# Patient Record
Sex: Male | Born: 1935
Health system: Southern US, Community
[De-identification: ages and names within clinical notes are randomized; demographics above are authoritative.]

## PROBLEM LIST (undated history)

## (undated) DIAGNOSIS — N138 Other obstructive and reflux uropathy: Secondary | ICD-10-CM

## (undated) DIAGNOSIS — K219 Gastro-esophageal reflux disease without esophagitis: Secondary | ICD-10-CM

## (undated) DIAGNOSIS — K648 Other hemorrhoids: Secondary | ICD-10-CM

## (undated) DIAGNOSIS — N319 Neuromuscular dysfunction of bladder, unspecified: Secondary | ICD-10-CM

## (undated) DIAGNOSIS — R5383 Other fatigue: Secondary | ICD-10-CM

## (undated) DIAGNOSIS — K589 Irritable bowel syndrome without diarrhea: Secondary | ICD-10-CM

## (undated) DIAGNOSIS — M1990 Unspecified osteoarthritis, unspecified site: Secondary | ICD-10-CM

## (undated) DIAGNOSIS — R5381 Other malaise: Secondary | ICD-10-CM

## (undated) DIAGNOSIS — R933 Abnormal findings on diagnostic imaging of other parts of digestive tract: Secondary | ICD-10-CM

## (undated) DIAGNOSIS — M542 Cervicalgia: Secondary | ICD-10-CM

## (undated) DIAGNOSIS — G629 Polyneuropathy, unspecified: Secondary | ICD-10-CM

## (undated) DIAGNOSIS — H33009 Unspecified retinal detachment with retinal break, unspecified eye: Secondary | ICD-10-CM

## (undated) DIAGNOSIS — E119 Type 2 diabetes mellitus without complications: Secondary | ICD-10-CM

## (undated) DIAGNOSIS — I471 Supraventricular tachycardia, unspecified: Secondary | ICD-10-CM

## (undated) DIAGNOSIS — K5730 Diverticulosis of large intestine without perforation or abscess without bleeding: Secondary | ICD-10-CM

## (undated) DIAGNOSIS — M47812 Spondylosis without myelopathy or radiculopathy, cervical region: Secondary | ICD-10-CM

## (undated) DIAGNOSIS — C61 Malignant neoplasm of prostate: Secondary | ICD-10-CM

## (undated) DIAGNOSIS — N401 Enlarged prostate with lower urinary tract symptoms: Secondary | ICD-10-CM

## (undated) DIAGNOSIS — N4340 Spermatocele of epididymis, unspecified: Secondary | ICD-10-CM

## (undated) DIAGNOSIS — G2581 Restless legs syndrome: Secondary | ICD-10-CM

## (undated) DIAGNOSIS — I4510 Unspecified right bundle-branch block: Secondary | ICD-10-CM

## (undated) DIAGNOSIS — I10 Essential (primary) hypertension: Secondary | ICD-10-CM

## (undated) DIAGNOSIS — E1165 Type 2 diabetes mellitus with hyperglycemia: Secondary | ICD-10-CM

## (undated) DIAGNOSIS — M6530 Trigger finger, unspecified finger: Secondary | ICD-10-CM

## (undated) DIAGNOSIS — C801 Malignant (primary) neoplasm, unspecified: Secondary | ICD-10-CM

## (undated) DIAGNOSIS — E785 Hyperlipidemia, unspecified: Secondary | ICD-10-CM

## (undated) DIAGNOSIS — H3530 Unspecified macular degeneration: Secondary | ICD-10-CM

## (undated) DIAGNOSIS — I951 Orthostatic hypotension: Secondary | ICD-10-CM

## (undated) DIAGNOSIS — J309 Allergic rhinitis, unspecified: Secondary | ICD-10-CM

## (undated) DIAGNOSIS — G5600 Carpal tunnel syndrome, unspecified upper limb: Secondary | ICD-10-CM

## (undated) DIAGNOSIS — K6289 Other specified diseases of anus and rectum: Secondary | ICD-10-CM

## (undated) DIAGNOSIS — E559 Vitamin D deficiency, unspecified: Secondary | ICD-10-CM

## (undated) DIAGNOSIS — IMO0001 Reserved for inherently not codable concepts without codable children: Secondary | ICD-10-CM

## (undated) DIAGNOSIS — G4700 Insomnia, unspecified: Secondary | ICD-10-CM

## (undated) HISTORY — DX: Supraventricular tachycardia, unspecified: I47.10

## (undated) HISTORY — DX: Neuromuscular dysfunction of bladder, unspecified: N31.9

## (undated) HISTORY — DX: Supraventricular tachycardia: I47.1

## (undated) HISTORY — DX: Malignant neoplasm of prostate: C61

## (undated) HISTORY — DX: Diverticulosis of large intestine without perforation or abscess without bleeding: K57.30

## (undated) HISTORY — DX: Type 2 diabetes mellitus with hyperglycemia: E11.65

## (undated) HISTORY — DX: Gastro-esophageal reflux disease without esophagitis: K21.9

## (undated) HISTORY — DX: Polyneuropathy, unspecified: G62.9

## (undated) HISTORY — DX: Other malaise: R53.81

## (undated) HISTORY — DX: Spondylosis without myelopathy or radiculopathy, cervical region: M47.812

## (undated) HISTORY — PX: EYE SURGERY: SHX253

## (undated) HISTORY — DX: Unspecified retinal detachment with retinal break, unspecified eye: H33.009

## (undated) HISTORY — DX: Insomnia, unspecified: G47.00

## (undated) HISTORY — DX: Type 2 diabetes mellitus without complications: E11.9

## (undated) HISTORY — DX: Carpal tunnel syndrome, unspecified upper limb: G56.00

## (undated) HISTORY — DX: Spermatocele of epididymis, unspecified: N43.40

## (undated) HISTORY — DX: Cervicalgia: M54.2

## (undated) HISTORY — DX: Restless legs syndrome: G25.81

## (undated) HISTORY — DX: Other fatigue: R53.83

## (undated) HISTORY — DX: Trigger finger, unspecified finger: M65.30

## (undated) HISTORY — DX: Abnormal findings on diagnostic imaging of other parts of digestive tract: R93.3

## (undated) HISTORY — DX: Unspecified osteoarthritis, unspecified site: M19.90

## (undated) HISTORY — DX: Other obstructive and reflux uropathy: N13.8

## (undated) HISTORY — DX: Benign prostatic hyperplasia with lower urinary tract symptoms: N40.1

## (undated) HISTORY — DX: Unspecified macular degeneration: H35.30

## (undated) HISTORY — DX: Reserved for inherently not codable concepts without codable children: IMO0001

## (undated) HISTORY — DX: Essential (primary) hypertension: I10

## (undated) HISTORY — DX: Vitamin D deficiency, unspecified: E55.9

## (undated) HISTORY — DX: Other hemorrhoids: K64.8

## (undated) HISTORY — DX: Irritable bowel syndrome, unspecified: K58.9

## (undated) HISTORY — DX: Allergic rhinitis, unspecified: J30.9

## (undated) HISTORY — DX: Unspecified right bundle-branch block: I45.10

## (undated) HISTORY — DX: Orthostatic hypotension: I95.1

## (undated) HISTORY — DX: Hyperlipidemia, unspecified: E78.5

## (undated) HISTORY — DX: Other specified diseases of anus and rectum: K62.89

## (undated) HISTORY — DX: Malignant (primary) neoplasm, unspecified: C80.1

---
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
## 1977-01-22 HISTORY — PX: KNEE SURGERY: SHX244

## 1978-01-22 HISTORY — PX: OTHER SURGICAL HISTORY: SHX169

## 1987-01-23 HISTORY — PX: DUPUYTREN CONTRACTURE RELEASE: SHX1478

## 1989-08-22 HISTORY — PX: TRANSURETHRAL RESECTION OF PROSTATE: SHX73

## 1994-01-22 HISTORY — PX: CHOLECYSTECTOMY: SHX55

## 1994-02-22 HISTORY — PX: EYE SURGERY: SHX253

## 1995-01-23 HISTORY — PX: EYE SURGERY: SHX253

## 1997-01-22 HISTORY — PX: TENDON RELEASE: SHX230

## 2000-03-29 ENCOUNTER — Encounter: Payer: Self-pay | Admitting: Internal Medicine

## 2000-03-29 ENCOUNTER — Ambulatory Visit (HOSPITAL_COMMUNITY): Admission: RE | Admit: 2000-03-29 | Discharge: 2000-03-29 | Payer: Self-pay | Admitting: Internal Medicine

## 2000-08-06 ENCOUNTER — Emergency Department (HOSPITAL_COMMUNITY): Admission: EM | Admit: 2000-08-06 | Discharge: 2000-08-06 | Payer: Self-pay | Admitting: Emergency Medicine

## 2002-05-12 ENCOUNTER — Emergency Department (HOSPITAL_COMMUNITY): Admission: EM | Admit: 2002-05-12 | Discharge: 2002-05-12 | Payer: Self-pay | Admitting: Emergency Medicine

## 2002-05-12 ENCOUNTER — Encounter: Payer: Self-pay | Admitting: Emergency Medicine

## 2002-08-14 ENCOUNTER — Ambulatory Visit (HOSPITAL_COMMUNITY): Admission: RE | Admit: 2002-08-14 | Discharge: 2002-08-14 | Payer: Self-pay | Admitting: *Deleted

## 2002-08-14 ENCOUNTER — Encounter (INDEPENDENT_AMBULATORY_CARE_PROVIDER_SITE_OTHER): Payer: Self-pay | Admitting: Specialist

## 2002-12-30 ENCOUNTER — Emergency Department (HOSPITAL_COMMUNITY): Admission: EM | Admit: 2002-12-30 | Discharge: 2002-12-30 | Payer: Self-pay | Admitting: Emergency Medicine

## 2003-01-23 HISTORY — PX: COLONOSCOPY W/ POLYPECTOMY: SHX1380

## 2003-01-23 HISTORY — PX: RETINAL DETACHMENT SURGERY: SHX105

## 2004-01-23 HISTORY — PX: EYE SURGERY: SHX253

## 2004-01-26 ENCOUNTER — Ambulatory Visit: Payer: Self-pay | Admitting: Internal Medicine

## 2004-10-16 ENCOUNTER — Ambulatory Visit: Payer: Self-pay | Admitting: Internal Medicine

## 2004-12-13 ENCOUNTER — Ambulatory Visit: Payer: Self-pay | Admitting: Internal Medicine

## 2006-12-30 ENCOUNTER — Emergency Department (HOSPITAL_COMMUNITY): Admission: EM | Admit: 2006-12-30 | Discharge: 2006-12-30 | Payer: Self-pay | Admitting: Emergency Medicine

## 2007-09-03 ENCOUNTER — Inpatient Hospital Stay (HOSPITAL_COMMUNITY): Admission: EM | Admit: 2007-09-03 | Discharge: 2007-09-06 | Payer: Self-pay | Admitting: Emergency Medicine

## 2008-12-17 ENCOUNTER — Ambulatory Visit (HOSPITAL_COMMUNITY): Admission: RE | Admit: 2008-12-17 | Discharge: 2008-12-17 | Payer: Self-pay | Admitting: Urology

## 2008-12-24 ENCOUNTER — Ambulatory Visit: Admission: RE | Admit: 2008-12-24 | Discharge: 2009-01-06 | Payer: Self-pay | Admitting: Radiation Oncology

## 2009-02-17 ENCOUNTER — Ambulatory Visit: Admission: RE | Admit: 2009-02-17 | Discharge: 2009-05-03 | Payer: Self-pay | Admitting: Radiation Oncology

## 2009-04-20 ENCOUNTER — Encounter: Admission: RE | Admit: 2009-04-20 | Discharge: 2009-04-20 | Payer: Self-pay | Admitting: Internal Medicine

## 2010-01-28 ENCOUNTER — Emergency Department (HOSPITAL_COMMUNITY)
Admission: EM | Admit: 2010-01-28 | Discharge: 2010-01-29 | Payer: Self-pay | Source: Home / Self Care | Admitting: Emergency Medicine

## 2010-02-03 ENCOUNTER — Emergency Department (HOSPITAL_COMMUNITY)
Admission: EM | Admit: 2010-02-03 | Discharge: 2010-02-03 | Payer: Self-pay | Source: Home / Self Care | Admitting: Emergency Medicine

## 2010-02-06 LAB — DIFFERENTIAL
Basophils Absolute: 0 10*3/uL (ref 0.0–0.1)
Basophils Absolute: 0 10*3/uL (ref 0.0–0.1)
Basophils Relative: 0 % (ref 0–1)
Basophils Relative: 0 % (ref 0–1)
Eosinophils Absolute: 0.1 10*3/uL (ref 0.0–0.7)
Eosinophils Absolute: 0.1 10*3/uL (ref 0.0–0.7)
Eosinophils Relative: 2 % (ref 0–5)
Eosinophils Relative: 2 % (ref 0–5)
Lymphocytes Relative: 14 % (ref 12–46)
Lymphocytes Relative: 20 % (ref 12–46)
Lymphs Abs: 1 10*3/uL (ref 0.7–4.0)
Lymphs Abs: 1 10*3/uL (ref 0.7–4.0)
Monocytes Absolute: 0.7 10*3/uL (ref 0.1–1.0)
Monocytes Absolute: 0.5 10*3/uL (ref 0.1–1.0)
Monocytes Relative: 10 % (ref 3–12)
Monocytes Relative: 10 % (ref 3–12)
Neutro Abs: 5.2 10*3/uL (ref 1.7–7.7)
Neutro Abs: 3.5 10*3/uL (ref 1.7–7.7)
Neutrophils Relative %: 75 % (ref 43–77)
Neutrophils Relative %: 68 % (ref 43–77)

## 2010-02-06 LAB — COMPREHENSIVE METABOLIC PANEL
ALT: 20 U/L (ref 0–53)
AST: 18 U/L (ref 0–37)
Albumin: 3.7 g/dL (ref 3.5–5.2)
Alkaline Phosphatase: 113 U/L (ref 39–117)
BUN: 13 mg/dL (ref 6–23)
CO2: 23 mEq/L (ref 19–32)
Calcium: 10 mg/dL (ref 8.4–10.5)
Chloride: 109 mEq/L (ref 96–112)
Creatinine, Ser: 1.09 mg/dL (ref 0.4–1.5)
GFR calc Af Amer: >60 mL/min (ref >60)
GFR calc non Af Amer: >60 mL/min (ref >60)
Glucose, Bld: 127 mg/dL — ABNORMAL HIGH (ref 70–99)
Potassium: 3.9 mEq/L (ref 3.5–5.1)
Sodium: 139 mEq/L (ref 135–145)
Total Bilirubin: 0.9 mg/dL (ref 0.3–1.2)
Total Protein: 6.1 g/dL (ref 6.0–8.3)

## 2010-02-06 LAB — URINALYSIS, ROUTINE W REFLEX MICROSCOPIC
Bilirubin Urine: NEGATIVE
Bilirubin Urine: NEGATIVE
Hgb urine dipstick: NEGATIVE
Hgb urine dipstick: NEGATIVE
Ketones, ur: NEGATIVE mg/dL
Ketones, ur: NEGATIVE mg/dL
Nitrite: NEGATIVE
Nitrite: NEGATIVE
Protein, ur: NEGATIVE mg/dL
Protein, ur: NEGATIVE mg/dL
Specific Gravity, Urine: 1.013 (ref 1.005–1.030)
Specific Gravity, Urine: 1.012 (ref 1.005–1.030)
Urine Glucose, Fasting: NEGATIVE mg/dL
Urine Glucose, Fasting: NEGATIVE mg/dL
Urobilinogen, UA: 0.2 mg/dL (ref 0.0–1.0)
Urobilinogen, UA: 0.2 mg/dL (ref 0.0–1.0)
pH: 6.5 (ref 5.0–8.0)
pH: 5.5 (ref 5.0–8.0)

## 2010-02-06 LAB — CBC
HCT: 37.5 % — ABNORMAL LOW (ref 39.0–52.0)
HCT: 41.3 % (ref 39.0–52.0)
Hemoglobin: 12.9 g/dL — ABNORMAL LOW (ref 13.0–17.0)
Hemoglobin: 14.2 g/dL (ref 13.0–17.0)
MCH: 31.3 pg (ref 26.0–34.0)
MCH: 30.9 pg (ref 26.0–34.0)
MCHC: 34.4 g/dL (ref 30.0–36.0)
MCHC: 34.4 g/dL (ref 30.0–36.0)
MCV: 91 fL (ref 78.0–100.0)
MCV: 90 fL (ref 78.0–100.0)
Platelets: 193 10*3/uL (ref 150–400)
Platelets: 209 10*3/uL (ref 150–400)
RBC: 4.12 MIL/uL — ABNORMAL LOW (ref 4.22–5.81)
RBC: 4.59 MIL/uL (ref 4.22–5.81)
RDW: 12.6 % (ref 11.5–15.5)
RDW: 12.3 % (ref 11.5–15.5)
WBC: 7 10*3/uL (ref 4.0–10.5)
WBC: 5.1 10*3/uL (ref 4.0–10.5)

## 2010-02-06 LAB — LIPASE, BLOOD
Lipase: 23 U/L (ref 11–59)
Lipase: 34 U/L (ref 11–59)

## 2010-02-06 LAB — GLUCOSE, CAPILLARY
Glucose-Capillary: 71 mg/dL (ref 70–99)
Glucose-Capillary: 46 mg/dL — ABNORMAL LOW (ref 70–99)
Glucose-Capillary: 161 mg/dL — ABNORMAL HIGH (ref 70–99)

## 2010-02-06 LAB — POCT I-STAT, CHEM 8
BUN: 13 mg/dL (ref 6–23)
Calcium, Ion: 1.24 mmol/L (ref 1.12–1.32)
Chloride: 113 mEq/L — ABNORMAL HIGH (ref 96–112)
Creatinine, Ser: 1 mg/dL (ref 0.4–1.5)
Glucose, Bld: 71 mg/dL (ref 70–99)
HCT: 39 % (ref 39.0–52.0)
Hemoglobin: 13.3 g/dL (ref 13.0–17.0)
Potassium: 3.6 mEq/L (ref 3.5–5.1)
Sodium: 142 mEq/L (ref 135–145)
TCO2: 20 mmol/L (ref 0–100)

## 2010-02-06 LAB — TROPONIN I: Troponin I: 0.01 ng/mL (ref 0.00–0.06)

## 2010-02-06 LAB — CK TOTAL AND CKMB (NOT AT ARMC)
CK, MB: 1.6 ng/mL (ref 0.3–4.0)
Relative Index: INVALID (ref 0.0–2.5)
Total CK: 71 U/L (ref 7–232)

## 2010-02-06 LAB — LACTIC ACID, PLASMA: Lactic Acid, Venous: 1.1 mmol/L (ref 0.5–2.2)

## 2010-04-11 HISTORY — PX: COLONOSCOPY: SHX174

## 2010-06-06 NOTE — Consult Note (Signed)
NAME:  Micheal [name], Micheal [name] NO.:  [id number]   MEDICAL RECORD NO.:  [id number]          PATIENT TYPE:  INP   LOCATION:  5151                         FACILITY:  [facility]   PHYSICIAN:  Maisie Fus A. Cornett, M.D.DATE OF BIRTH:  08/21/1928   DATE OF CONSULTATION:  09/03/2007  DATE OF DISCHARGE:                                 CONSULTATION   REFERRING PHYSICIAN:  Zadie Rhine, MD   REASON FOR CONSULTATION:  Abdominal distention and abdominal pain.   HISTORY OF PRESENT ILLNESS:  The patient is a 77 year old male with past  history of cholecystectomy and appendectomy with a 10-day history of  diffuse mild intermittent abdominal pain.  The pain is dull, achy in  nature.  Over the last 24 hours though he has had increasing crampy  abdominal pain diffusely through his abdomen.  No vomiting, but he does  have some nausea.  Last bowel movement was earlier today.  Nothing seems  to make it worse or better.  It seems to be increasing in nature.  Now  he has got more distention of his abdomen, which he did not have  earlier.  He was seen by his primary care doctor and apparently was told  he had pancreatitis.  He has had his gallbladder removed.  He denies  any alcohol use.  CT scan was obtained, which showed small-bowel  obstruction.  I was asked to see the patient at the request of Dr.  Bebe Shaggy.   PAST MEDICAL HISTORY:  1. Type 2 diabetes mellitus.  2. Hypertension.  3. Hypercholesterolemia.  4. History of prostate disease, presumably cancer.   PAST SURGICAL HISTORY:  Cholecystectomy, appendectomy, prostatectomy,  and knee surgery.   MEDICATIONS:  1. Glimepiride.  2. Simvastatin.  3. Avodart.  4. Multivitamins.   FAMILY HISTORY:  Otherwise negative for any significant disease process  related to this.   REVIEW OF SYSTEMS:  As above, otherwise negative x15.   ALLERGIES TO MEDICINE:  SULFA.   SOCIAL HISTORY:  Denies tobacco or alcohol use.  He is married.   PHYSICAL  EXAMINATION:  VITAL SIGNS:  Temperature 96.7, heart rate 103,  and blood pressure 139/75.  GENERAL APPEARANCE:  Pleasant male in no apparent distress.  HEENT:  Extraocular movements are intact.  Oropharynx is moist.  NECK:  Supple and nontender.  No mass.  Trachea midline.  PULMONARY:  Lung sounds are clear bilaterally.  Chest wall motion normal  bilaterally.  CARDIOVASCULAR:  Regular rate and rhythm without rub, murmur, or gallop.  EXTREMITIES:  Warm and well perfused.  No edema.  ABDOMEN:  Distended, hypertympanitic, otherwise soft.  Mild tenderness  but no significant rebound or guarding.  Bowel sounds are present,  mildly hyperactive.  No hernia.  GU:  No evidence of inguinal hernia bilaterally.  Phallus and scrotum  are grossly normal.  EXTREMITIES:  Muscle tone normal.  Range of motion normal.  NEUROLOGIC:  Glasgow Coma Scale is 15.  Motor and sensory function are  grossly intact.   DIAGNOSTIC STUDIES:  I have reviewed his abdominal CT scan, which shows  a partial small-bowel obstruction to  complete obstruction in the distal  small bowel.  No free air.  Pancreas is normal.  The patient has a white  count of 17,800, hemoglobin 18.3, and platelet count 277,000.  Sodium  139, potassium 3.9, chloride 104, CO2 of 25, BUN 12, creatinine 1, and  glucose 219.  LFTs are normal.  Lipase is normal.  Lactase 1.0.  Urinalysis shows some ketones and protein, otherwise negative.   IMPRESSION:  1. Partial small-bowel obstruction.  Admit for IV fluids, NG tube, and      acute abdominal series in the morning.  2. Type 2 diabetes mellitus, placed on sliding scale for tonight, make      him n.p.o.  3. Hypertension, stable.  4. Hypercholesterolemia, stable.      Thomas A. Cornett, M.D.  Electronically Signed     TAC/MEDQ  D:  09/03/2007  T:  09/04/2007  Job:  [id number]   cc:   Lenon Curt. Chilton Si, M.D.  Zadie Rhine, MD

## 2010-06-06 NOTE — Discharge Summary (Signed)
Micheal [name], STOFKO NO.:  [id number]   MEDICAL RECORD NO.:  [id number]          PATIENT TYPE:  INP   LOCATION:  5151                         FACILITY:  [facility]   PHYSICIAN:  Anselm Pancoast. Weatherly, M.D.DATE OF BIRTH:  [birth year]   DATE OF ADMISSION:  09/03/2007  DATE OF DISCHARGE:  09/06/2007                               DISCHARGE SUMMARY   DISCHARGE DIAGNOSES:  1. Partial small bowel obstruction.  2. History of diabetes mellitus and previously has had kind of a      motility issue with kind of partial constipation.   HISTORY:  Micheal [name] is a 77 year old Caucasian male who is followed  with Dr. Murray Hodgkins, and he presented to the emergency room on September 03, 2007, with abdominal pain and nausea.  The patient is a known  diabetic and is on oral medication for his diabetes.  In his previous  surgery, he has had an open appendectomy years ago and a  cholecystectomy.  He has had prostate surgery and knee surgery.   In the emergency room, he arrived approximately at 3 o'clock, was  afebrile.  His blood pressure of 144/97, pulse is 125, and respirations  20, and was seen by the ER physician, and his white count was 17,800 and  hematocrit was 53.5, significantly dehydrated, but his BUN was only 12  and his electrolyte showed a sodium of 139, potassium 3.9, chloride 104,  CO2 of 25, his glucose was 219, and lipase was 52.  Liver function  studies were normal and urinalysis was essentially unremarkable.   The patient had a CT and the findings had fluid in the proximal colon.  The colon is not dilated.  There is an extension of distal small bowel  that is dilated, no enlarged lymph node.  There was a large amount of  fluid within his stomach and it was read as a moderate to high-grade  small bowel obstruction with possible transition point in the right  lower quadrant.  An NG tube was placed and the patient had a large  amount of bilious drainage and then was  admitted and seen by Dr. Luisa Hart  who admitted the patient along with multiple health admissions that  evening.  The patient was admitted to 5155, and I saw the patient the  following morning, said he was feeling much better.  He was having good  urine output and the NG tube that initially had a large canister full of  drainage, was putting out very little drainage.  The patient had a  followup abdominal x-ray the following morning and there was a little  bit of contrast that had gotten into the small bowel.  The stomach was  decompressed and the small bowel was less dilated.  We left the NG tube  in an additional 24 hours and had very little drainage.  The patient was  kind of covered on a sliding scale, but sugars were not that  significantly elevated, and the following morning took his NG tube out.  We did give him a large soapsuds enema with a very large bowel  movement  after the soapsuds enema, and then on the second morning he had another  soapsuds enema, but there was not that much stool on the second enema.  His abdominal pain is basically resolved.  The following morning, we got  a repeat x-ray that showed a little bit of small bowel dilatation, all  the contrast is in the colon, and he was started on a liquid diet, which  he has tolerated without problems.  He is now 24 hours later, his white  count is normal, his hematocrit is 40 where it had been 53 on admission,  and then we started him back on his usual diabetic medications yesterday  orally and he is done nicely.  I think his IV can be discontinued.  I  would advise that we continue on his basically a full liquid diet  diabetic for the next 48 hours and then he can resume a regular diabetic  diet on Monday.  The patient has an appointment followup with Dr. Murray Hodgkins on Tuesday, and I have given the patient's wife a hand written  note about his studies and etc. for Dr. Chilton Si, and if any questions, he  can call me.   The  patient had a colonoscopy by Dr. Virginia Rochester approximately 3 years ago and  he has not had any blood in his stool and I do not think that this is a  colon obstruction; I think it is probably an element of constipation and  a partial small bowel obstruction from previous adhesions in the pelvis  probably from his appendectomy.  There was no evidence of any hernias,  and the patient is completely asymptomatic at this time.  He is not  necessarily to see me in the office unless he is having further episodes  of abdominal pain, and I have suggested that he take MiraLax on a  regular basis as he is probably getting into kind of a atonic colon  secondary to his diabetes.   His usual medications are to be continued, and they are;  1. Glimepiride 4 mg p.o. 2 times a day.  2. Simvastatin 40 mg daily.  3. Avodart 0.5 mg daily.  4. Multivitamins.   He is discharged in improved condition and we will get the MiraLax, and  if there is further problems with nausea and vomiting, he will call and  be similarly seen.  His abdomen is soft and his x-ray this morning  showed still a moderate amount of contrast within the colon, but it is  distributed throughout the colon and only an occasional very minimal  dilated gas within the small bowel at this time.           ______________________________  Anselm Pancoast. Zachery Dakins, M.D.     WJW/MEDQ  D:  09/06/2007  T:  09/07/2007  Job:  [id number]   cc:   Lenon Curt. Chilton Si, M.D.

## 2010-06-09 NOTE — Op Note (Signed)
   NAME:  Micheal [name], Micheal [name] NO.:  [id number]   MEDICAL RECORD NO.:  [id number]                   PATIENT TYPE:  AMB   LOCATION:  ENDO                                 FACILITY:  [facility]   PHYSICIAN:  Georgiana Spinner, M.D.                 DATE OF BIRTH:  [birth year]   DATE OF PROCEDURE:  DATE OF DISCHARGE:                                 OPERATIVE REPORT   PROCEDURE:  Upper endoscopy with biopsy.   ANESTHESIA:  Demerol 80 mg, Versed 8 mg.   DESCRIPTION OF PROCEDURE:  With the patient mildly sedated in the left  lateral decubitus position, the Olympus videoscopic endoscope was inserted  in the mouth and passed under direct vision through the esophagus, which  appeared normal.  Distal esophagus would never open widely so the GE  junction was difficult to assess but there were two areas what appeared to  be flame-like tissue extending from the GE junction cephalad  in the  esophagus.  This was photographed and biopsied to rule out Barrett's.  We  entered into the stomach.  The  fundus, body, antrum, duodenal bulb and  second portion of the duodenum all appeared normal.  From this point, the  endoscope was slowly withdrawn taking circumferential views of the duodenal  mucosa until the endoscope then pulled back into the stomach, placed in  retroflexion and viewed the stomach from below.  The endoscope was then  straightened, withdrawn, taking circumferential views of the remaining  gastric and esophageal mucosa.  The patient's vital signs and pulse oximetry  remained stable.  The patient tolerated the procedure well without apparent  complication.   FINDINGS:  Question Barrett's esophagus, biopsied.  Await biopsy report.  We  will have the patient call me for results and follow up with me as an  outpatient.  Proceed to colonoscopy as planned.                                                Georgiana Spinner, M.D.    GMO/MEDQ  D:  08/14/2002  T:  08/14/2002  Job:   [id number]   cc:   Lenon Curt Chilton Si, M.D.  [address]  [place]  [place] [address]  Fax: [phone]

## 2010-06-09 NOTE — Op Note (Signed)
   NAME:  Micheal [name], Micheal [name] NO.:  [id number]   MEDICAL RECORD NO.:  [id number]                   PATIENT TYPE:  AMB   LOCATION:  ENDO                                 [facility]ACILITY:  [facility]   PHYSICIAN:  Georgiana Spinner, M.D.                 DATE OF BIRTH:  12-26-1928   DATE OF PROCEDURE:  DATE OF DISCHARGE:                                 OPERATIVE REPORT   PROCEDURE:  Colonoscopy.   INDICATIONS FOR PROCEDURE:  Colon polyps.   ANESTHESIA:  Demerol 20, Versed 2 mg.   PROCEDURE:  With the patient mildly sedated in the left lateral decubitus  position, the Olympus videoscopic colonoscope was inserted in the rectum and  after a normal rectal examination revealed the prostate to be slightly  enlarged, the endoscope was then passed under direct vision to the cecum  identified by the ileocecal valve and appendiceal orifice both of which were  photographed. From this point, the colonoscope was slowly withdrawn taking  circumferential views of the colonic mucosa stopping first in the proximal  descending colon where a polyp was seen, photographed and removed using hot  biopsy forceps technique on a setting of 20:20 blended current. There was a  moderate amount of diverticulosis seen in the sigmoid colon and also another  small polyp was seen here, it too was photographed and it too was removed  using hot biopsy forceps technique again at a setting of 20:20 blended  current. The endoscope was then withdrawn all the way to the rectum which  appeared normal in direct and retroflexed view. The endoscope was  straightened and withdrawn. The patient's vital signs and pulse oximeter  remained stable. The patient tolerated the procedure well without apparent  complications.   FINDINGS:  Diverticulosis of the sigmoid colon, polyp in the sigmoid colon  and also in the descending colon proximally.   PLAN:  Await biopsy report. The patient will call me for results and  followup  with me as an outpatient.                                               Georgiana Spinner, M.D.    GMO/MEDQ  D:  08/14/2002  T:  08/14/2002  Job:  [id number]   cc:   Lenon Curt. Chilton Si, M.D.  [address]  [place]  [place] [address]  Fax: [phone]

## 2010-10-20 LAB — BASIC METABOLIC PANEL
BUN: 12
BUN: 6
CO2: 25
CO2: 28
Calcium: 9.6
Calcium: 9.9
Chloride: 107
Chloride: 108
Creatinine, Ser: 0.77
Creatinine, Ser: 0.92
GFR calc Af Amer: >60
GFR calc Af Amer: >60
GFR calc non Af Amer: >60
GFR calc non Af Amer: >60
Glucose, Bld: 194 — ABNORMAL HIGH
Glucose, Bld: 247 — ABNORMAL HIGH
Potassium: 3.9
Potassium: 4.7
Sodium: 137
Sodium: 138

## 2010-10-20 LAB — DIFFERENTIAL
Basophils Absolute: 0
Basophils Relative: 0
Eosinophils Absolute: 0.1
Eosinophils Relative: 1
Lymphocytes Relative: 7 — ABNORMAL LOW
Lymphs Abs: 1.3
Monocytes Absolute: 1.1 — ABNORMAL HIGH
Monocytes Relative: 6
Neutro Abs: 15.3 — ABNORMAL HIGH
Neutrophils Relative %: 86 — ABNORMAL HIGH

## 2010-10-20 LAB — URINALYSIS, ROUTINE W REFLEX MICROSCOPIC
Bilirubin Urine: NEGATIVE
Glucose, UA: NEGATIVE
Hgb urine dipstick: NEGATIVE
Ketones, ur: 15 — AB
Leukocytes, UA: NEGATIVE
Nitrite: NEGATIVE
Protein, ur: 30 — AB
Specific Gravity, Urine: 1.018
Urobilinogen, UA: 1
pH: 5.5

## 2010-10-20 LAB — GLUCOSE, CAPILLARY
Glucose-Capillary: 238 — ABNORMAL HIGH
Glucose-Capillary: 145 — ABNORMAL HIGH
Glucose-Capillary: 183 — ABNORMAL HIGH
Glucose-Capillary: 184 — ABNORMAL HIGH
Glucose-Capillary: 191 — ABNORMAL HIGH
Glucose-Capillary: 195 — ABNORMAL HIGH
Glucose-Capillary: 198 — ABNORMAL HIGH
Glucose-Capillary: 198 — ABNORMAL HIGH
Glucose-Capillary: 217 — ABNORMAL HIGH
Glucose-Capillary: 229 — ABNORMAL HIGH
Glucose-Capillary: 230 — ABNORMAL HIGH
Glucose-Capillary: 240 — ABNORMAL HIGH
Glucose-Capillary: 269 — ABNORMAL HIGH

## 2010-10-20 LAB — URINE MICROSCOPIC-ADD ON

## 2010-10-20 LAB — COMPREHENSIVE METABOLIC PANEL
ALT: 25
AST: 20
Albumin: 4.7
Alkaline Phosphatase: 98
BUN: 12
CO2: 25
Calcium: 12 — ABNORMAL HIGH
Chloride: 104
Creatinine, Ser: 1.06
GFR calc Af Amer: >60
GFR calc non Af Amer: >60
Glucose, Bld: 219 — ABNORMAL HIGH
Potassium: 3.9
Sodium: 139
Total Bilirubin: 1.4 — ABNORMAL HIGH
Total Protein: 7.8

## 2010-10-20 LAB — CBC
HCT: 53.5 — ABNORMAL HIGH
HCT: 40.1
HCT: 40.7
Hemoglobin: 18.3 — ABNORMAL HIGH
Hemoglobin: 13.6
Hemoglobin: 13.9
MCHC: 34.1
MCHC: 34
MCHC: 34
MCV: 91.4
MCV: 91.9
MCV: 92.2
Platelets: 273
Platelets: 182
Platelets: 186
RBC: 5.86 — ABNORMAL HIGH
RBC: 4.37
RBC: 4.41
RDW: 12.4
RDW: 12
RDW: 12.2
WBC: 17.8 — ABNORMAL HIGH
WBC: 10.8 — ABNORMAL HIGH
WBC: 7.9

## 2010-10-20 LAB — POCT CARDIAC MARKERS
CKMB, poc: 2.4
Myoglobin, poc: 76.9
Troponin i, poc: <0.05

## 2010-10-20 LAB — LIPASE, BLOOD: Lipase: 52

## 2010-10-20 LAB — HEMOGLOBIN A1C
Hgb A1c MFr Bld: 7.4 — ABNORMAL HIGH
Mean Plasma Glucose: 186

## 2010-10-20 LAB — LACTIC ACID, PLASMA: Lactic Acid, Venous: 1

## 2010-10-30 LAB — CBC
HCT: 50.1
Hemoglobin: 17
MCHC: 34
MCV: 89.8
Platelets: 307
RBC: 5.58
RDW: 12.5
WBC: 11.4 — ABNORMAL HIGH

## 2010-10-30 LAB — URINALYSIS, ROUTINE W REFLEX MICROSCOPIC
Bilirubin Urine: NEGATIVE
Glucose, UA: 100 — AB
Hgb urine dipstick: NEGATIVE
Ketones, ur: NEGATIVE
Nitrite: NEGATIVE
Protein, ur: NEGATIVE
Specific Gravity, Urine: 1.019
Urobilinogen, UA: 0.2
pH: 6

## 2010-10-30 LAB — COMPREHENSIVE METABOLIC PANEL
ALT: 20
AST: 17
Albumin: 4.5
Alkaline Phosphatase: 101
BUN: 13
CO2: 29
Calcium: 11.1 — ABNORMAL HIGH
Chloride: 102
Creatinine, Ser: 0.99
GFR calc Af Amer: >60
GFR calc non Af Amer: >60
Glucose, Bld: 233 — ABNORMAL HIGH
Potassium: 4.8
Sodium: 138
Total Bilirubin: 0.9
Total Protein: 7.4

## 2010-10-30 LAB — POCT CARDIAC MARKERS
CKMB, poc: <1 — ABNORMAL LOW
Myoglobin, poc: 60.7
Operator id: 294501
Troponin i, poc: <0.05

## 2010-10-30 LAB — DIFFERENTIAL
Basophils Absolute: 0
Basophils Relative: 0
Eosinophils Absolute: 0.1 — ABNORMAL LOW
Eosinophils Relative: 1
Lymphocytes Relative: 12
Lymphs Abs: 1.3
Monocytes Absolute: 0.8
Monocytes Relative: 7
Neutro Abs: 9.1 — ABNORMAL HIGH
Neutrophils Relative %: 80 — ABNORMAL HIGH

## 2010-10-30 LAB — LIPASE, BLOOD: Lipase: 142 — ABNORMAL HIGH

## 2010-12-27 ENCOUNTER — Other Ambulatory Visit: Payer: Self-pay | Admitting: Gastroenterology

## 2010-12-27 DIAGNOSIS — K862 Cyst of pancreas: Secondary | ICD-10-CM

## 2010-12-27 DIAGNOSIS — R1033 Periumbilical pain: Secondary | ICD-10-CM

## 2010-12-29 ENCOUNTER — Ambulatory Visit
Admission: RE | Admit: 2010-12-29 | Discharge: 2010-12-29 | Disposition: A | Discharge status: Home / Self Care | Payer: Medicare Other | Source: Ambulatory Visit | Attending: Gastroenterology | Admitting: Gastroenterology

## 2010-12-29 ENCOUNTER — Other Ambulatory Visit: Payer: Self-pay

## 2010-12-29 DIAGNOSIS — R1033 Periumbilical pain: Secondary | ICD-10-CM

## 2010-12-29 DIAGNOSIS — K862 Cyst of pancreas: Secondary | ICD-10-CM

## 2010-12-29 MED ORDER — IOHEXOL 300 MG/ML  SOLN
100.0000 mL | Freq: Once | INTRAMUSCULAR | Status: AC | PRN
  Administered 2010-12-29: 100 mL via INTRAVENOUS

## 2011-01-29 DIAGNOSIS — IMO0001 Reserved for inherently not codable concepts without codable children: Secondary | ICD-10-CM | POA: Diagnosis not present

## 2011-01-29 DIAGNOSIS — K589 Irritable bowel syndrome without diarrhea: Secondary | ICD-10-CM | POA: Diagnosis not present

## 2011-01-29 DIAGNOSIS — I10 Essential (primary) hypertension: Secondary | ICD-10-CM | POA: Diagnosis not present

## 2011-01-29 DIAGNOSIS — E785 Hyperlipidemia, unspecified: Secondary | ICD-10-CM | POA: Diagnosis not present

## 2011-02-05 DIAGNOSIS — H353290 Exudative age-related macular degeneration, unspecified eye, stage unspecified: Secondary | ICD-10-CM | POA: Diagnosis not present

## 2011-03-07 DIAGNOSIS — IMO0001 Reserved for inherently not codable concepts without codable children: Secondary | ICD-10-CM | POA: Diagnosis not present

## 2011-03-07 DIAGNOSIS — E785 Hyperlipidemia, unspecified: Secondary | ICD-10-CM | POA: Diagnosis not present

## 2011-03-07 DIAGNOSIS — I10 Essential (primary) hypertension: Secondary | ICD-10-CM | POA: Diagnosis not present

## 2011-03-07 DIAGNOSIS — K589 Irritable bowel syndrome without diarrhea: Secondary | ICD-10-CM | POA: Diagnosis not present

## 2011-03-12 DIAGNOSIS — H353290 Exudative age-related macular degeneration, unspecified eye, stage unspecified: Secondary | ICD-10-CM | POA: Diagnosis not present

## 2011-03-23 DIAGNOSIS — J209 Acute bronchitis, unspecified: Secondary | ICD-10-CM | POA: Diagnosis not present

## 2011-03-27 DIAGNOSIS — C61 Malignant neoplasm of prostate: Secondary | ICD-10-CM | POA: Diagnosis not present

## 2011-04-03 DIAGNOSIS — N138 Other obstructive and reflux uropathy: Secondary | ICD-10-CM | POA: Diagnosis not present

## 2011-04-03 DIAGNOSIS — N401 Enlarged prostate with lower urinary tract symptoms: Secondary | ICD-10-CM | POA: Diagnosis not present

## 2011-04-03 DIAGNOSIS — C61 Malignant neoplasm of prostate: Secondary | ICD-10-CM | POA: Diagnosis not present

## 2011-04-09 DIAGNOSIS — H353290 Exudative age-related macular degeneration, unspecified eye, stage unspecified: Secondary | ICD-10-CM | POA: Diagnosis not present

## 2011-05-18 DIAGNOSIS — H353290 Exudative age-related macular degeneration, unspecified eye, stage unspecified: Secondary | ICD-10-CM | POA: Diagnosis not present

## 2011-05-22 DIAGNOSIS — E119 Type 2 diabetes mellitus without complications: Secondary | ICD-10-CM | POA: Diagnosis not present

## 2011-05-22 DIAGNOSIS — H33009 Unspecified retinal detachment with retinal break, unspecified eye: Secondary | ICD-10-CM | POA: Diagnosis not present

## 2011-05-22 DIAGNOSIS — H33309 Unspecified retinal break, unspecified eye: Secondary | ICD-10-CM | POA: Diagnosis not present

## 2011-05-22 DIAGNOSIS — Z79899 Other long term (current) drug therapy: Secondary | ICD-10-CM | POA: Diagnosis not present

## 2011-05-22 DIAGNOSIS — Z794 Long term (current) use of insulin: Secondary | ICD-10-CM | POA: Diagnosis not present

## 2011-05-22 DIAGNOSIS — Z5181 Encounter for therapeutic drug level monitoring: Secondary | ICD-10-CM | POA: Diagnosis not present

## 2011-05-22 DIAGNOSIS — H3530 Unspecified macular degeneration: Secondary | ICD-10-CM | POA: Diagnosis not present

## 2011-05-23 DIAGNOSIS — H3320 Serous retinal detachment, unspecified eye: Secondary | ICD-10-CM | POA: Diagnosis not present

## 2011-05-23 DIAGNOSIS — H353290 Exudative age-related macular degeneration, unspecified eye, stage unspecified: Secondary | ICD-10-CM | POA: Diagnosis not present

## 2011-05-24 DIAGNOSIS — Z961 Presence of intraocular lens: Secondary | ICD-10-CM | POA: Diagnosis not present

## 2011-05-24 DIAGNOSIS — H35059 Retinal neovascularization, unspecified, unspecified eye: Secondary | ICD-10-CM | POA: Diagnosis not present

## 2011-05-24 DIAGNOSIS — Z794 Long term (current) use of insulin: Secondary | ICD-10-CM | POA: Diagnosis not present

## 2011-05-24 DIAGNOSIS — H33009 Unspecified retinal detachment with retinal break, unspecified eye: Secondary | ICD-10-CM | POA: Diagnosis not present

## 2011-05-24 DIAGNOSIS — Z79899 Other long term (current) drug therapy: Secondary | ICD-10-CM | POA: Diagnosis not present

## 2011-05-24 DIAGNOSIS — E119 Type 2 diabetes mellitus without complications: Secondary | ICD-10-CM | POA: Diagnosis not present

## 2011-05-24 DIAGNOSIS — Z8546 Personal history of malignant neoplasm of prostate: Secondary | ICD-10-CM | POA: Diagnosis not present

## 2011-05-24 DIAGNOSIS — H318 Other specified disorders of choroid: Secondary | ICD-10-CM | POA: Diagnosis not present

## 2011-05-28 DIAGNOSIS — IMO0001 Reserved for inherently not codable concepts without codable children: Secondary | ICD-10-CM | POA: Diagnosis not present

## 2011-05-28 DIAGNOSIS — E785 Hyperlipidemia, unspecified: Secondary | ICD-10-CM | POA: Diagnosis not present

## 2011-06-06 DIAGNOSIS — R5383 Other fatigue: Secondary | ICD-10-CM | POA: Diagnosis not present

## 2011-06-06 DIAGNOSIS — IMO0001 Reserved for inherently not codable concepts without codable children: Secondary | ICD-10-CM | POA: Diagnosis not present

## 2011-06-06 DIAGNOSIS — R5381 Other malaise: Secondary | ICD-10-CM | POA: Diagnosis not present

## 2011-06-06 DIAGNOSIS — C61 Malignant neoplasm of prostate: Secondary | ICD-10-CM | POA: Diagnosis not present

## 2011-07-04 DIAGNOSIS — H353290 Exudative age-related macular degeneration, unspecified eye, stage unspecified: Secondary | ICD-10-CM | POA: Diagnosis not present

## 2011-07-04 DIAGNOSIS — H3320 Serous retinal detachment, unspecified eye: Secondary | ICD-10-CM | POA: Diagnosis not present

## 2011-08-20 DIAGNOSIS — H353290 Exudative age-related macular degeneration, unspecified eye, stage unspecified: Secondary | ICD-10-CM | POA: Diagnosis not present

## 2011-08-20 DIAGNOSIS — H3320 Serous retinal detachment, unspecified eye: Secondary | ICD-10-CM | POA: Diagnosis not present

## 2011-08-22 DIAGNOSIS — J040 Acute laryngitis: Secondary | ICD-10-CM | POA: Diagnosis not present

## 2011-08-22 DIAGNOSIS — J209 Acute bronchitis, unspecified: Secondary | ICD-10-CM | POA: Diagnosis not present

## 2011-09-19 DIAGNOSIS — D235 Other benign neoplasm of skin of trunk: Secondary | ICD-10-CM | POA: Diagnosis not present

## 2011-09-19 DIAGNOSIS — L570 Actinic keratosis: Secondary | ICD-10-CM | POA: Diagnosis not present

## 2011-09-19 DIAGNOSIS — L723 Sebaceous cyst: Secondary | ICD-10-CM | POA: Diagnosis not present

## 2011-09-21 DIAGNOSIS — H3320 Serous retinal detachment, unspecified eye: Secondary | ICD-10-CM | POA: Diagnosis not present

## 2011-09-21 DIAGNOSIS — H3321 Serous retinal detachment, right eye: Secondary | ICD-10-CM | POA: Insufficient documentation

## 2011-09-21 DIAGNOSIS — H353290 Exudative age-related macular degeneration, unspecified eye, stage unspecified: Secondary | ICD-10-CM | POA: Diagnosis not present

## 2011-09-27 DIAGNOSIS — C61 Malignant neoplasm of prostate: Secondary | ICD-10-CM | POA: Diagnosis not present

## 2011-10-04 DIAGNOSIS — N509 Disorder of male genital organs, unspecified: Secondary | ICD-10-CM | POA: Diagnosis not present

## 2011-10-04 DIAGNOSIS — N138 Other obstructive and reflux uropathy: Secondary | ICD-10-CM | POA: Diagnosis not present

## 2011-10-04 DIAGNOSIS — N401 Enlarged prostate with lower urinary tract symptoms: Secondary | ICD-10-CM | POA: Diagnosis not present

## 2011-10-04 DIAGNOSIS — C61 Malignant neoplasm of prostate: Secondary | ICD-10-CM | POA: Diagnosis not present

## 2011-10-22 DIAGNOSIS — H353290 Exudative age-related macular degeneration, unspecified eye, stage unspecified: Secondary | ICD-10-CM | POA: Diagnosis not present

## 2011-10-22 DIAGNOSIS — H3320 Serous retinal detachment, unspecified eye: Secondary | ICD-10-CM | POA: Diagnosis not present

## 2011-11-01 DIAGNOSIS — I10 Essential (primary) hypertension: Secondary | ICD-10-CM | POA: Diagnosis not present

## 2011-11-01 DIAGNOSIS — E785 Hyperlipidemia, unspecified: Secondary | ICD-10-CM | POA: Diagnosis not present

## 2011-11-01 DIAGNOSIS — N138 Other obstructive and reflux uropathy: Secondary | ICD-10-CM | POA: Diagnosis not present

## 2011-11-01 DIAGNOSIS — IMO0001 Reserved for inherently not codable concepts without codable children: Secondary | ICD-10-CM | POA: Diagnosis not present

## 2011-11-01 DIAGNOSIS — N401 Enlarged prostate with lower urinary tract symptoms: Secondary | ICD-10-CM | POA: Diagnosis not present

## 2011-11-07 DIAGNOSIS — E559 Vitamin D deficiency, unspecified: Secondary | ICD-10-CM | POA: Diagnosis not present

## 2011-11-07 DIAGNOSIS — I10 Essential (primary) hypertension: Secondary | ICD-10-CM | POA: Diagnosis not present

## 2011-11-07 DIAGNOSIS — E785 Hyperlipidemia, unspecified: Secondary | ICD-10-CM | POA: Diagnosis not present

## 2011-11-07 DIAGNOSIS — K589 Irritable bowel syndrome without diarrhea: Secondary | ICD-10-CM | POA: Diagnosis not present

## 2011-11-19 DIAGNOSIS — H353290 Exudative age-related macular degeneration, unspecified eye, stage unspecified: Secondary | ICD-10-CM | POA: Diagnosis not present

## 2011-12-17 DIAGNOSIS — H353290 Exudative age-related macular degeneration, unspecified eye, stage unspecified: Secondary | ICD-10-CM | POA: Diagnosis not present

## 2012-01-25 DIAGNOSIS — H353290 Exudative age-related macular degeneration, unspecified eye, stage unspecified: Secondary | ICD-10-CM | POA: Diagnosis not present

## 2012-02-01 DIAGNOSIS — IMO0001 Reserved for inherently not codable concepts without codable children: Secondary | ICD-10-CM | POA: Diagnosis not present

## 2012-02-01 DIAGNOSIS — E785 Hyperlipidemia, unspecified: Secondary | ICD-10-CM | POA: Diagnosis not present

## 2012-02-01 DIAGNOSIS — E559 Vitamin D deficiency, unspecified: Secondary | ICD-10-CM | POA: Diagnosis not present

## 2012-02-01 DIAGNOSIS — N401 Enlarged prostate with lower urinary tract symptoms: Secondary | ICD-10-CM | POA: Diagnosis not present

## 2012-02-01 DIAGNOSIS — R5381 Other malaise: Secondary | ICD-10-CM | POA: Diagnosis not present

## 2012-02-01 DIAGNOSIS — R5383 Other fatigue: Secondary | ICD-10-CM | POA: Diagnosis not present

## 2012-02-01 DIAGNOSIS — N138 Other obstructive and reflux uropathy: Secondary | ICD-10-CM | POA: Diagnosis not present

## 2012-02-01 DIAGNOSIS — I10 Essential (primary) hypertension: Secondary | ICD-10-CM | POA: Diagnosis not present

## 2012-02-05 DIAGNOSIS — E119 Type 2 diabetes mellitus without complications: Secondary | ICD-10-CM | POA: Diagnosis not present

## 2012-02-05 DIAGNOSIS — H33009 Unspecified retinal detachment with retinal break, unspecified eye: Secondary | ICD-10-CM | POA: Diagnosis not present

## 2012-02-05 DIAGNOSIS — E785 Hyperlipidemia, unspecified: Secondary | ICD-10-CM | POA: Diagnosis not present

## 2012-02-05 DIAGNOSIS — C61 Malignant neoplasm of prostate: Secondary | ICD-10-CM | POA: Diagnosis not present

## 2012-03-03 DIAGNOSIS — H353290 Exudative age-related macular degeneration, unspecified eye, stage unspecified: Secondary | ICD-10-CM | POA: Diagnosis not present

## 2012-03-24 DIAGNOSIS — K589 Irritable bowel syndrome without diarrhea: Secondary | ICD-10-CM | POA: Insufficient documentation

## 2012-03-24 DIAGNOSIS — E785 Hyperlipidemia, unspecified: Secondary | ICD-10-CM | POA: Insufficient documentation

## 2012-03-24 DIAGNOSIS — IMO0001 Reserved for inherently not codable concepts without codable children: Secondary | ICD-10-CM

## 2012-03-24 DIAGNOSIS — H3322 Serous retinal detachment, left eye: Secondary | ICD-10-CM | POA: Insufficient documentation

## 2012-03-24 DIAGNOSIS — K6289 Other specified diseases of anus and rectum: Secondary | ICD-10-CM | POA: Insufficient documentation

## 2012-03-24 DIAGNOSIS — G5600 Carpal tunnel syndrome, unspecified upper limb: Secondary | ICD-10-CM | POA: Insufficient documentation

## 2012-03-24 DIAGNOSIS — K5730 Diverticulosis of large intestine without perforation or abscess without bleeding: Secondary | ICD-10-CM | POA: Insufficient documentation

## 2012-03-24 DIAGNOSIS — I10 Essential (primary) hypertension: Secondary | ICD-10-CM

## 2012-03-24 DIAGNOSIS — E119 Type 2 diabetes mellitus without complications: Secondary | ICD-10-CM

## 2012-03-24 DIAGNOSIS — J309 Allergic rhinitis, unspecified: Secondary | ICD-10-CM | POA: Insufficient documentation

## 2012-03-24 DIAGNOSIS — K648 Other hemorrhoids: Secondary | ICD-10-CM | POA: Insufficient documentation

## 2012-03-24 DIAGNOSIS — I471 Supraventricular tachycardia: Secondary | ICD-10-CM | POA: Insufficient documentation

## 2012-03-24 DIAGNOSIS — C61 Malignant neoplasm of prostate: Secondary | ICD-10-CM | POA: Insufficient documentation

## 2012-03-24 DIAGNOSIS — I4510 Unspecified right bundle-branch block: Secondary | ICD-10-CM

## 2012-03-24 DIAGNOSIS — H3530 Unspecified macular degeneration: Secondary | ICD-10-CM

## 2012-03-24 DIAGNOSIS — H33009 Unspecified retinal detachment with retinal break, unspecified eye: Secondary | ICD-10-CM

## 2012-03-24 DIAGNOSIS — I951 Orthostatic hypotension: Secondary | ICD-10-CM | POA: Insufficient documentation

## 2012-03-24 DIAGNOSIS — K219 Gastro-esophageal reflux disease without esophagitis: Secondary | ICD-10-CM

## 2012-03-24 DIAGNOSIS — N138 Other obstructive and reflux uropathy: Secondary | ICD-10-CM

## 2012-03-24 DIAGNOSIS — G2581 Restless legs syndrome: Secondary | ICD-10-CM

## 2012-03-24 DIAGNOSIS — N319 Neuromuscular dysfunction of bladder, unspecified: Secondary | ICD-10-CM

## 2012-03-24 DIAGNOSIS — N400 Enlarged prostate without lower urinary tract symptoms: Secondary | ICD-10-CM | POA: Insufficient documentation

## 2012-03-24 DIAGNOSIS — E559 Vitamin D deficiency, unspecified: Secondary | ICD-10-CM | POA: Insufficient documentation

## 2012-03-24 DIAGNOSIS — N4340 Spermatocele of epididymis, unspecified: Secondary | ICD-10-CM | POA: Insufficient documentation

## 2012-03-24 DIAGNOSIS — N401 Enlarged prostate with lower urinary tract symptoms: Secondary | ICD-10-CM

## 2012-03-27 DIAGNOSIS — C61 Malignant neoplasm of prostate: Secondary | ICD-10-CM | POA: Diagnosis not present

## 2012-03-31 DIAGNOSIS — H353290 Exudative age-related macular degeneration, unspecified eye, stage unspecified: Secondary | ICD-10-CM | POA: Diagnosis not present

## 2012-03-31 DIAGNOSIS — H35739 Hemorrhagic detachment of retinal pigment epithelium, unspecified eye: Secondary | ICD-10-CM | POA: Diagnosis not present

## 2012-04-03 DIAGNOSIS — N401 Enlarged prostate with lower urinary tract symptoms: Secondary | ICD-10-CM | POA: Diagnosis not present

## 2012-04-03 DIAGNOSIS — N509 Disorder of male genital organs, unspecified: Secondary | ICD-10-CM | POA: Diagnosis not present

## 2012-04-03 DIAGNOSIS — C61 Malignant neoplasm of prostate: Secondary | ICD-10-CM | POA: Diagnosis not present

## 2012-04-03 DIAGNOSIS — N138 Other obstructive and reflux uropathy: Secondary | ICD-10-CM | POA: Diagnosis not present

## 2012-04-10 ENCOUNTER — Other Ambulatory Visit: Payer: Self-pay | Admitting: Geriatric Medicine

## 2012-04-10 DIAGNOSIS — E785 Hyperlipidemia, unspecified: Secondary | ICD-10-CM

## 2012-04-10 DIAGNOSIS — E119 Type 2 diabetes mellitus without complications: Secondary | ICD-10-CM

## 2012-04-10 DIAGNOSIS — I10 Essential (primary) hypertension: Secondary | ICD-10-CM

## 2012-04-21 ENCOUNTER — Other Ambulatory Visit: Payer: Self-pay

## 2012-04-21 ENCOUNTER — Other Ambulatory Visit: Payer: Medicare Other

## 2012-04-21 DIAGNOSIS — E785 Hyperlipidemia, unspecified: Secondary | ICD-10-CM | POA: Diagnosis not present

## 2012-04-21 DIAGNOSIS — I10 Essential (primary) hypertension: Secondary | ICD-10-CM | POA: Diagnosis not present

## 2012-04-21 DIAGNOSIS — E119 Type 2 diabetes mellitus without complications: Secondary | ICD-10-CM | POA: Diagnosis not present

## 2012-04-22 LAB — LIPID PANEL
Chol/HDL Ratio: 5.6 ratio units — ABNORMAL HIGH (ref 0.0–5.0)
Cholesterol, Total: 202 mg/dL — ABNORMAL HIGH (ref 100–199)
HDL: 36 mg/dL — ABNORMAL LOW (ref >39)
LDL Calculated: 121 mg/dL — ABNORMAL HIGH (ref 0–99)
Triglycerides: 223 mg/dL — ABNORMAL HIGH (ref 0–149)
VLDL Cholesterol Cal: 45 mg/dL — ABNORMAL HIGH (ref 5–40)

## 2012-04-22 LAB — BASIC METABOLIC PANEL
BUN/Creatinine Ratio: 12 (ref 10–22)
BUN: 12 mg/dL (ref 8–27)
CO2: 25 mmol/L (ref 19–28)
Calcium: 11.1 mg/dL — ABNORMAL HIGH (ref 8.6–10.2)
Chloride: 109 mmol/L — ABNORMAL HIGH (ref 97–108)
Creatinine, Ser: 1 mg/dL (ref 0.76–1.27)
GFR calc Af Amer: 81 mL/min/{1.73_m2} (ref >59)
GFR calc non Af Amer: 70 mL/min/{1.73_m2} (ref >59)
Glucose: 140 mg/dL — ABNORMAL HIGH (ref 65–99)
Potassium: 6.2 mmol/L — ABNORMAL HIGH (ref 3.5–5.2)
Sodium: 145 mmol/L — ABNORMAL HIGH (ref 134–144)

## 2012-04-22 LAB — HEMOGLOBIN A1C
Est. average glucose Bld gHb Est-mCnc: 212 mg/dL
Hgb A1c MFr Bld: 9 % — ABNORMAL HIGH (ref 4.8–5.6)

## 2012-04-23 ENCOUNTER — Encounter: Payer: Self-pay | Admitting: Internal Medicine

## 2012-04-23 ENCOUNTER — Ambulatory Visit (INDEPENDENT_AMBULATORY_CARE_PROVIDER_SITE_OTHER): Payer: Medicare Other | Admitting: Internal Medicine

## 2012-04-23 VITALS — BP 118/64 | HR 84 | Temp 98.0°F | Resp 18 | Ht 67.3 in | Wt 160.6 lb

## 2012-04-23 DIAGNOSIS — I10 Essential (primary) hypertension: Secondary | ICD-10-CM | POA: Diagnosis not present

## 2012-04-23 DIAGNOSIS — R5381 Other malaise: Secondary | ICD-10-CM

## 2012-04-23 DIAGNOSIS — C61 Malignant neoplasm of prostate: Secondary | ICD-10-CM | POA: Diagnosis not present

## 2012-04-23 DIAGNOSIS — E785 Hyperlipidemia, unspecified: Secondary | ICD-10-CM

## 2012-04-23 DIAGNOSIS — E119 Type 2 diabetes mellitus without complications: Secondary | ICD-10-CM | POA: Diagnosis not present

## 2012-04-23 MED ORDER — TAMSULOSIN HCL 0.4 MG PO CAPS: ORAL_CAPSULE | ORAL | Status: DC

## 2012-04-23 MED ORDER — INSULIN NPH ISOPHANE & REGULAR (70-30) 100 UNIT/ML ~~LOC~~ SUSP: SUBCUTANEOUS | Status: DC

## 2012-04-23 MED ORDER — INSULIN GLARGINE 100 UNIT/ML ~~LOC~~ SOLN: SUBCUTANEOUS | Status: DC

## 2012-04-23 NOTE — Patient Instructions (Signed)
Change Lantus to 25 units at bed. Start the Humulin 70/30 insulin at 10 units each morning.

## 2012-04-23 NOTE — Progress Notes (Signed)
Subjective:    Patient ID: Micheal [name], male    DOB: 07/31/1928, 79 y.o.   MRN: [id number]  Diabetes He presents for his follow-up diabetic visit. He has type 2 diabetes mellitus. His disease course has been worsening. Hypoglycemia symptoms include hunger, nervousness/anxiousness and tremors. Pertinent negatives for hypoglycemia include no confusion, dizziness, headaches, mood changes, pallor, seizures, sleepiness, speech difficulty or sweats. Associated symptoms include fatigue, visual change and weakness. Pertinent negatives for diabetes include no blurred vision, no foot paresthesias, no foot ulcerations, no polydipsia, no polyphagia, no polyuria and no weight loss. There are no hypoglycemic complications. Symptoms are stable. Diabetic complications include autonomic neuropathy. Pertinent negatives for diabetic complications include no CVA, PVD or retinopathy. Risk factors for coronary artery disease include diabetes mellitus, dyslipidemia, hypertension, male sex and stress. Current diabetic treatment includes diet and insulin injections. He is compliant with treatment most of the time. He is currently taking insulin at bedtime. Insulin injections are given by patient. His weight is stable. He is following a diabetic and generally healthy diet. He has not had a previous visit with a dietician. He participates in exercise daily. His home blood glucose trend is increasing steadily. His lunch blood glucose range is generally 180-200 mg/dl. His dinner blood glucose range is generally >200 mg/dl. His highest blood glucose is >200 mg/dl. His overall blood glucose range is 180-200 mg/dl. He does not see a podiatrist.Eye exam is current.  Hypertension This is a chronic problem. The current episode started more than 1 year ago. The problem is unchanged. The problem is controlled. Associated symptoms include anxiety and malaise/fatigue. Pertinent negatives include no blurred vision, headaches, neck pain,  palpitations, peripheral edema, PND, shortness of breath or sweats. There are no associated agents to hypertension. Risk factors for coronary artery disease include diabetes mellitus, dyslipidemia, family history, male gender, obesity and stress. There is no history of angina, kidney disease, CAD/MI, CVA, heart failure, left ventricular hypertrophy, PVD, renovascular disease, retinopathy or a thyroid problem. There is no history of chronic renal disease, coarctation of the aorta, hyperaldosteronism, hypercortisolism, hyperparathyroidism, a hypertension causing med, pheochromocytoma or sleep apnea.  Hyperlipidemia This is a chronic problem. The current episode started more than 1 year ago. The problem is controlled. Recent lipid tests were reviewed and are normal. Exacerbating diseases include diabetes. He has no history of chronic renal disease. There are no known factors aggravating his hyperlipidemia. Pertinent negatives include no myalgias or shortness of breath. Current antihyperlipidemic treatment includes statins. The current treatment provides moderate improvement of lipids. There are no compliance problems.  Risk factors for coronary artery disease include diabetes mellitus, dyslipidemia, family history, male sex, hypertension and stress.    Prostate cancer followed by Dr. Isabel Caprice. No relapse noted. Low PSA )done by Dr. Isabel Caprice).  Continues to complain of weakness. No change in symptoms.    Review of Systems  Constitutional: Positive for malaise/fatigue and fatigue. Negative for fever, chills, weight loss, diaphoresis, activity change and appetite change.  HENT: Positive for hearing loss. Negative for neck pain.   Eyes: Positive for visual disturbance. Negative for blurred vision.       Severe visual problems related to macular degeneration.  Respiratory: Negative.  Negative for shortness of breath.   Cardiovascular: Negative.  Negative for palpitations and PND.  Endocrine: Negative for  polydipsia, polyphagia and polyuria.       Diabetic. Poor control.  Genitourinary:       Hx prostate cancer. Also may have BPH. Followed by Dr.  Grapey.  Musculoskeletal: Positive for arthralgias. Negative for myalgias, back pain, joint swelling and gait problem.  Skin: Negative.  Negative for pallor.  Allergic/Immunologic: Negative.   Neurological: Positive for tremors and weakness. Negative for dizziness, seizures, speech difficulty and headaches.  Hematological: Negative.   Psychiatric/Behavioral: Negative for confusion. The patient is nervous/anxious.        Objective:   Physical Exam  Constitutional: He is oriented to person, place, and time. He appears well-developed and well-nourished.  HENT:  Head: Normocephalic and atraumatic.  Loss of hearing.  Eyes:  Severe visual losses due to macular degeneration  Neck: Normal range of motion. Neck supple. No JVD present. No tracheal deviation present. No thyromegaly present.  Cardiovascular: Normal rate, regular rhythm, normal heart sounds and intact distal pulses.   Pulmonary/Chest: Effort normal and breath sounds normal.  Abdominal: Soft. Bowel sounds are normal. He exhibits no mass. There is no guarding.  Musculoskeletal: Normal range of motion. He exhibits no edema and no tenderness.  Lymphadenopathy:    He has no cervical adenopathy.  Neurological: He is alert and oriented to person, place, and time. No cranial nerve deficit. Coordination normal.  Skin: Skin is warm and dry.  Multiple SK.  Psychiatric: He has a normal mood and affect. His behavior is normal. Thought content normal.       Lab: 09/27/11 PSA: 0.003 04/21/12 A1c 9.0  Lipids: tchol 202, trig 223,, HDL 36, LDL 121  BMP: glu 161, Na+ 145, K+ 6.2, Ca 11.1 Assessment & Plan:   Due to poor control of DM and rising A1c, I have changed insulin. Will reduce Lantus hs and start Humulin 70/30in AM. Patient will self adust to higher dose of Humulin if daytime control of DM is  not better in 2 weeks.  Other meds to remain the same.

## 2012-04-28 ENCOUNTER — Ambulatory Visit (INDEPENDENT_AMBULATORY_CARE_PROVIDER_SITE_OTHER): Payer: Medicare Other | Admitting: Pharmacotherapy

## 2012-04-28 ENCOUNTER — Encounter: Payer: Self-pay | Admitting: Pharmacotherapy

## 2012-04-28 VITALS — BP 120/80 | HR 79 | Temp 97.8°F | Resp 16 | Wt 159.0 lb

## 2012-04-28 DIAGNOSIS — IMO0001 Reserved for inherently not codable concepts without codable children: Secondary | ICD-10-CM | POA: Diagnosis not present

## 2012-04-28 DIAGNOSIS — I10 Essential (primary) hypertension: Secondary | ICD-10-CM | POA: Insufficient documentation

## 2012-04-28 DIAGNOSIS — E785 Hyperlipidemia, unspecified: Secondary | ICD-10-CM | POA: Diagnosis not present

## 2012-04-28 NOTE — Progress Notes (Signed)
Subjective:    Micheal [name] is a 79 y.o. male who presents for follow-up of Type 2 diabetes mellitus.   He has been on insulin for about a year. Last week Dr. Nyoka Cowden added Humulin 70/30 to his Lantus. Since change in insulin - his BG has ranged 63-165 (average 90 fasting, 140 post-prandial) 1 episode hypoglycemia (63)  His current A1C is 9.0%  In the past he has been on oral agents for his diabetes, but his glucose has been rising. His PMH is also significant for HTN and dyslipidemia  He doesn't tolerate fresh fruits after his radiation treatment for prostate cancer.  Raw fruits and veggies appear to cause GERD and GI distress.  He does try to incorporate fiber and use sugar substitutes when available.  They due eat small amounts of bacon and sausage twice a week with eggs (scrambled or fried).  The meal they seem to splurge on is country breakfast.  He does drink 1% milk. On other mornings, eats oatmeal or frozen waffles. Not skipping meals.  Typically keeps a regular schedule for eating meals. Review of his food diary shows most meals are heavy on the starch - lots of beans and starchy veggies.  Has not been exercising.  Review of Systems A comprehensive review of systems was negative.    Objective:    BP 120/80  Pulse 79  Temp(Src) 97.8 F (36.6 C) (Oral)  Resp 16  Wt 159 lb (72.122 kg)  BMI 24.69 kg/m2  SpO2 94%  General:  alert, cooperative, appears stated age and no distress  Oropharynx: normal findings: lips normal without lesions and gums healthy   Eyes:  negative findings: lids and lashes normal, conjunctivae and sclerae normal and corneas clear           Lung: clear to auscultation bilaterally  Heart:  regular rate and rhythm, S1, S2 normal, no murmur, click, rub or gallop     Extremities: extremities normal, atraumatic, no cyanosis or edema  Skin: warm and dry, no hyperpigmentation, vitiligo, or suspicious lesions         Lab Review Glucose (mg/dL)   Date Value  04/21/2012 140*     Glucose, Bld (mg/dL)  Date Value  02/03/2010 71   01/29/2010 127*  09/05/2007 194*     CO2 (mmol/L)  Date Value  04/21/2012 25   01/29/2010 23   09/05/2007 25      BUN (mg/dL)  Date Value  04/21/2012 12   02/03/2010 13   01/29/2010 13   09/05/2007 6      Creatinine, Ser (mg/dL)  Date Value  04/21/2012 1.00   02/03/2010 1.0   01/29/2010 1.09        Assessment:    Diabetes Mellitus type II, under poor control.   HTN goal BP <140/80  LDL goal <100   Plan:    1.  Rx changes: none,  Dr. Nyoka Cowden added Humulin 70/30 10 units every morning last week.  BG control has been much better since that time. 2.  Continue Humulin 70/30 10 units every morning and Lantus 25 units every night. 3.  Counseled on real v relative hypoglycemia. 4.  Counseled at length on meal planning and portion control.  Focused on using "plate method".  He is to keep starch choice to 1 per meal.  Provided written information to supplement office education. 5.  Counseled on exercise goals.  Goal is 30-45 minutes 5 x week. 6.  BP at goal 7.  LDL  is 170 - above goal.  He is intolerant to statins due to myalgias.  Counseled at length on low fat / high fiber diet.  Needs to increase water intake to improve tolerability of fiber.

## 2012-05-05 DIAGNOSIS — H33009 Unspecified retinal detachment with retinal break, unspecified eye: Secondary | ICD-10-CM | POA: Diagnosis not present

## 2012-05-05 DIAGNOSIS — H353290 Exudative age-related macular degeneration, unspecified eye, stage unspecified: Secondary | ICD-10-CM | POA: Diagnosis not present

## 2012-06-06 DIAGNOSIS — H353290 Exudative age-related macular degeneration, unspecified eye, stage unspecified: Secondary | ICD-10-CM | POA: Diagnosis not present

## 2012-06-23 DIAGNOSIS — H353290 Exudative age-related macular degeneration, unspecified eye, stage unspecified: Secondary | ICD-10-CM | POA: Diagnosis not present

## 2012-06-27 DIAGNOSIS — H353290 Exudative age-related macular degeneration, unspecified eye, stage unspecified: Secondary | ICD-10-CM | POA: Diagnosis not present

## 2012-06-30 DIAGNOSIS — M47817 Spondylosis without myelopathy or radiculopathy, lumbosacral region: Secondary | ICD-10-CM | POA: Diagnosis not present

## 2012-06-30 DIAGNOSIS — R209 Unspecified disturbances of skin sensation: Secondary | ICD-10-CM | POA: Diagnosis not present

## 2012-07-04 DIAGNOSIS — M47817 Spondylosis without myelopathy or radiculopathy, lumbosacral region: Secondary | ICD-10-CM | POA: Diagnosis not present

## 2012-07-04 DIAGNOSIS — M545 Low back pain, unspecified: Secondary | ICD-10-CM | POA: Diagnosis not present

## 2012-07-09 DIAGNOSIS — M47817 Spondylosis without myelopathy or radiculopathy, lumbosacral region: Secondary | ICD-10-CM | POA: Diagnosis not present

## 2012-07-12 ENCOUNTER — Other Ambulatory Visit: Payer: Self-pay | Admitting: Internal Medicine

## 2012-07-21 DIAGNOSIS — H353290 Exudative age-related macular degeneration, unspecified eye, stage unspecified: Secondary | ICD-10-CM | POA: Diagnosis not present

## 2012-07-22 DIAGNOSIS — M47817 Spondylosis without myelopathy or radiculopathy, lumbosacral region: Secondary | ICD-10-CM | POA: Diagnosis not present

## 2012-07-24 ENCOUNTER — Other Ambulatory Visit: Payer: Medicare Other

## 2012-07-30 ENCOUNTER — Ambulatory Visit: Payer: Medicare Other | Admitting: Internal Medicine

## 2012-07-30 DIAGNOSIS — M47817 Spondylosis without myelopathy or radiculopathy, lumbosacral region: Secondary | ICD-10-CM | POA: Diagnosis not present

## 2012-07-30 DIAGNOSIS — M545 Low back pain, unspecified: Secondary | ICD-10-CM | POA: Diagnosis not present

## 2012-08-11 ENCOUNTER — Other Ambulatory Visit: Payer: Medicare Other

## 2012-08-11 DIAGNOSIS — IMO0001 Reserved for inherently not codable concepts without codable children: Secondary | ICD-10-CM | POA: Diagnosis not present

## 2012-08-11 DIAGNOSIS — I10 Essential (primary) hypertension: Secondary | ICD-10-CM | POA: Diagnosis not present

## 2012-08-13 ENCOUNTER — Encounter: Payer: Self-pay | Admitting: Internal Medicine

## 2012-08-13 ENCOUNTER — Ambulatory Visit (INDEPENDENT_AMBULATORY_CARE_PROVIDER_SITE_OTHER): Payer: Medicare Other | Admitting: Internal Medicine

## 2012-08-13 VITALS — BP 130/80 | HR 111 | Temp 97.9°F | Resp 18 | Ht 67.0 in | Wt 158.0 lb

## 2012-08-13 DIAGNOSIS — H3560 Retinal hemorrhage, unspecified eye: Secondary | ICD-10-CM | POA: Diagnosis not present

## 2012-08-13 DIAGNOSIS — I10 Essential (primary) hypertension: Secondary | ICD-10-CM | POA: Diagnosis not present

## 2012-08-13 DIAGNOSIS — H3562 Retinal hemorrhage, left eye: Secondary | ICD-10-CM

## 2012-08-13 DIAGNOSIS — E785 Hyperlipidemia, unspecified: Secondary | ICD-10-CM | POA: Diagnosis not present

## 2012-08-13 DIAGNOSIS — C61 Malignant neoplasm of prostate: Secondary | ICD-10-CM

## 2012-08-13 DIAGNOSIS — IMO0001 Reserved for inherently not codable concepts without codable children: Secondary | ICD-10-CM | POA: Diagnosis not present

## 2012-08-13 MED ORDER — INSULIN GLARGINE 100 UNIT/ML ~~LOC~~ SOLN: SUBCUTANEOUS | Status: DC

## 2012-08-13 NOTE — Progress Notes (Signed)
Subjective:    Patient ID: Micheal [name], male    DOB: Jul 15, 1928, 79 y.o.   MRN: [id number]  HPI Since last visit he was told he is legally blind. History of detached retina. Had bleeding of the retina. Vision was 20/200. Had cold laser treatment. Had steroid shots in his eye.  Had left sciatica. Saw Dr. Ethelene Hal. Got injection of steroid. Has relieved the pain.  Type II or unspecified type diabetes mellitus without mention of complication, uncontrolled: home glucose over 200 at times. Using Humulin 70/30 Kwik pen. Using Lantus. Denies experiencing any low blood sugars.  Other and unspecified hyperlipidemia: controlled  Unspecified essential hypertension: controlled  Urgency of urination. Seeing Dr. Isabel Caprice.   Having hot flashes and testicular discomfort as a complication of the treatment for prostate cancer.   Current Outpatient Prescriptions on File Prior to Visit  Medication Sig Dispense Refill  . acetaminophen (TYLENOL) 500 MG tablet Take 500 mg by mouth every 6 (six) hours as needed for pain. Take one tablet as needed for pain.      . B-D ULTRAFINE III SHORT PEN 31G X 8 MM MISC USE AS DIRECTED  100 each  4  . cholecalciferol (VITAMIN D) 1000 UNITS tablet Take 1,000 Units by mouth daily. Take 2 capsules daily for vitamin D supplement      . famotidine (PEPCID) 20 MG tablet Take 20 mg by mouth as needed. Take one tablet as needed for reflux.      . insulin glargine (LANTUS SOLOSTAR) 100 UNIT/ML injection Inject 25 units at bedtime for diabetes 250.02  3 pen  3  . insulin NPH-regular (HUMULIN 70/30 PEN) (70-30) 100 UNIT/ML injection Inject 10 units each morning to help control diabetes  15 mL  4  . nystatin-triamcinolone (MYCOLOG II) cream Apply topically 4 (four) times daily. Apply daily to rash      . tamsulosin (FLOMAX) 0.4 MG CAPS Take one capsule once day for prostate  90 capsule  3   No current facility-administered medications on file prior to visit.     Review of Systems   Constitutional: Positive for malaise/fatigue and fatigue. Negative for fever, chills, weight loss, diaphoresis, activity change and appetite change.  HENT: Positive for hearing loss. Negative for neck pain.   Eyes: Positive for visual disturbance. Negative for blurred vision.       Severe visual problems related to macular degeneration, retinal hemorrhage,  Respiratory: Negative.  Negative for shortness of breath.   Cardiovascular: Negative.  Negative for palpitations and PND.  Endocrine: Negative for polydipsia, polyphagia and polyuria.       Diabetic. Poor control.  Genitourinary:       Hx prostate cancer. Also may have BPH. Followed by Dr. Isabel Caprice.  Musculoskeletal: Positive for arthralgias. Negative for myalgias, back pain, joint swelling and gait problem.  Skin: Negative.  Negative for pallor.  Allergic/Immunologic: Negative.   Neurological: Positive for tremors and weakness. Negative for dizziness, seizures, speech difficulty and headaches.  Hematological: Negative.   Psychiatric/Behavioral: Negative for confusion. The patient is nervous/anxious.        Objective:BP 130/80  Pulse 111  Temp(Src) 97.9 F (36.6 C) (Oral)  Resp 18  Ht 5\' 7"  (1.702 m)  Wt 158 lb (71.668 kg)  BMI 24.74 kg/m2  SpO2 96%    Physical Exam  Constitutional: He is oriented to person, place, and time. He appears well-developed and well-nourished.  HENT:  Head: Normocephalic and atraumatic.  Loss of hearing.  Eyes:  Severe visual  losses due to macular degeneration  Neck: Normal range of motion. Neck supple. No JVD present. No tracheal deviation present. No thyromegaly present.  Cardiovascular: Normal rate, regular rhythm, normal heart sounds and intact distal pulses.   Pulmonary/Chest: Effort normal and breath sounds normal.  Abdominal: Soft. Bowel sounds are normal. He exhibits no mass. There is no guarding.  Musculoskeletal: Normal range of motion. He exhibits no edema and no tenderness.   Lymphadenopathy:    He has no cervical adenopathy.  Neurological: He is alert and oriented to person, place, and time. No cranial nerve deficit. Coordination normal.  Skin: Skin is warm and dry.  Multiple SK.  Psychiatric: He has a normal mood and affect. His behavior is normal. Thought content normal.      No visits with results within 2 Month(s) from this visit. Latest known visit with results is:  Appointment on 04/21/2012  Component Date Value Range Status  . Hemoglobin A1C 04/21/2012 9.0* 4.8 - 5.6 % Final   Comment:          Increased risk for diabetes: 5.7 - 6.4                                   Diabetes: >6.4                                   Glycemic control for adults with diabetes: <7.0  . Estimated average glucose 04/21/2012 212   Final  . Cholesterol, Total 04/21/2012 202* 100 - 199 mg/dL Final  . Triglycerides 04/21/2012 223* 0 - 149 mg/dL Final  . HDL 40/98/1191 36* >39 mg/dL Final   Comment: According to ATP-III Guidelines, HDL-C >59 mg/dL is considered a                          negative risk factor for CHD.  Marland Kitchen VLDL Cholesterol Cal 04/21/2012 45* 5 - 40 mg/dL Final  . LDL Calculated 04/21/2012 478* 0 - 99 mg/dL Final  . Chol/HDL Ratio 04/21/2012 5.6* 0.0 - 5.0 ratio units Final  . Glucose 04/21/2012 140* 65 - 99 mg/dL Final  . BUN 29/56/2130 12  8 - 27 mg/dL Final  . Creatinine, Ser 04/21/2012 1.00  0.76 - 1.27 mg/dL Final  . GFR calc non Af Amer 04/21/2012 70  >59 mL/min/1.73 Final  . GFR calc Af Amer 04/21/2012 81  >59 mL/min/1.73 Final  . BUN/Creatinine Ratio 04/21/2012 12  10 - 22 Final  . Sodium 04/21/2012 145* 134 - 144 mmol/L Final  . Potassium 04/21/2012 6.2* 3.5 - 5.2 mmol/L Final  . Chloride 04/21/2012 109* 97 - 108 mmol/L Final  . CO2 04/21/2012 25  19 - 28 mmol/L Final  . Calcium 04/21/2012 11.1* 8.6 - 10.2 mg/dL Final   8/65/78 BMP: glu 115, Ca++ 11.3  Lipids: TC 243, trig 143, HDL 50, LDL 164  A1c 8.9    Assessment & Plan:  Type II or  unspecified type diabetes mellitus without mention of complication, uncontrolled: Better control could be established. He should increase his Lantus to 40 units nightly. If his other conditions stabilize and his blood sugar starts to drop, he can begin reducing the Lantus again.   - Plan: insulin glargine (LANTUS) 100 UNIT/ML injection, Hemoglobin A1c, Basic metabolic panel  Other and unspecified hyperlipidemia: Excellent control  Unspecified essential hypertension: Controlled on current medication  Retinal hemorrhage of left eye: Continues to see his ophthalmologist. Vision has deteriorated substantially.  Malignant neoplasm of prostate: Continues to see Dr. Isabel Caprice.

## 2012-08-13 NOTE — Patient Instructions (Signed)
Increase Lantus to 40 units at bed.

## 2012-08-18 DIAGNOSIS — H353290 Exudative age-related macular degeneration, unspecified eye, stage unspecified: Secondary | ICD-10-CM | POA: Diagnosis not present

## 2012-08-20 DIAGNOSIS — M545 Low back pain, unspecified: Secondary | ICD-10-CM | POA: Diagnosis not present

## 2012-08-25 ENCOUNTER — Encounter: Payer: Self-pay | Admitting: Internal Medicine

## 2012-09-15 DIAGNOSIS — H353290 Exudative age-related macular degeneration, unspecified eye, stage unspecified: Secondary | ICD-10-CM | POA: Diagnosis not present

## 2012-10-08 ENCOUNTER — Other Ambulatory Visit: Payer: Self-pay | Admitting: *Deleted

## 2012-10-08 DIAGNOSIS — IMO0001 Reserved for inherently not codable concepts without codable children: Secondary | ICD-10-CM

## 2012-10-08 MED ORDER — INSULIN GLARGINE 100 UNIT/ML ~~LOC~~ SOLN: SUBCUTANEOUS | Status: DC

## 2012-10-20 DIAGNOSIS — H353290 Exudative age-related macular degeneration, unspecified eye, stage unspecified: Secondary | ICD-10-CM | POA: Diagnosis not present

## 2012-10-21 DIAGNOSIS — C61 Malignant neoplasm of prostate: Secondary | ICD-10-CM | POA: Diagnosis not present

## 2012-10-23 ENCOUNTER — Other Ambulatory Visit: Payer: Medicare Other

## 2012-10-23 DIAGNOSIS — E785 Hyperlipidemia, unspecified: Secondary | ICD-10-CM

## 2012-10-23 DIAGNOSIS — IMO0001 Reserved for inherently not codable concepts without codable children: Secondary | ICD-10-CM | POA: Diagnosis not present

## 2012-10-24 DIAGNOSIS — N319 Neuromuscular dysfunction of bladder, unspecified: Secondary | ICD-10-CM | POA: Diagnosis not present

## 2012-10-24 DIAGNOSIS — N509 Disorder of male genital organs, unspecified: Secondary | ICD-10-CM | POA: Diagnosis not present

## 2012-10-24 DIAGNOSIS — N138 Other obstructive and reflux uropathy: Secondary | ICD-10-CM | POA: Diagnosis not present

## 2012-10-24 DIAGNOSIS — C61 Malignant neoplasm of prostate: Secondary | ICD-10-CM | POA: Diagnosis not present

## 2012-10-24 DIAGNOSIS — N401 Enlarged prostate with lower urinary tract symptoms: Secondary | ICD-10-CM | POA: Diagnosis not present

## 2012-10-24 LAB — BASIC METABOLIC PANEL
BUN/Creatinine Ratio: 14 (ref 10–22)
BUN: 14 mg/dL (ref 8–27)
CO2: 23 mmol/L (ref 18–29)
Calcium: 11.1 mg/dL — ABNORMAL HIGH (ref 8.6–10.2)
Chloride: 108 mmol/L (ref 97–108)
Creatinine, Ser: 1.01 mg/dL (ref 0.76–1.27)
GFR calc Af Amer: 80 mL/min/{1.73_m2} (ref >59)
GFR calc non Af Amer: 69 mL/min/{1.73_m2} (ref >59)
Glucose: 149 mg/dL — ABNORMAL HIGH (ref 65–99)
Potassium: 4.5 mmol/L (ref 3.5–5.2)
Sodium: 142 mmol/L (ref 134–144)

## 2012-10-24 LAB — LIPID PANEL
Chol/HDL Ratio: 6.1 ratio units — ABNORMAL HIGH (ref 0.0–5.0)
Cholesterol, Total: 249 mg/dL — ABNORMAL HIGH (ref 100–199)
HDL: 41 mg/dL (ref >39)
LDL Calculated: 169 mg/dL — ABNORMAL HIGH (ref 0–99)
Triglycerides: 193 mg/dL — ABNORMAL HIGH (ref 0–149)
VLDL Cholesterol Cal: 39 mg/dL (ref 5–40)

## 2012-10-24 LAB — HEMOGLOBIN A1C
Est. average glucose Bld gHb Est-mCnc: 203 mg/dL
Hgb A1c MFr Bld: 8.7 % — ABNORMAL HIGH (ref 4.8–5.6)

## 2012-10-28 DIAGNOSIS — C4441 Basal cell carcinoma of skin of scalp and neck: Secondary | ICD-10-CM | POA: Diagnosis not present

## 2012-10-28 DIAGNOSIS — L570 Actinic keratosis: Secondary | ICD-10-CM | POA: Diagnosis not present

## 2012-10-28 DIAGNOSIS — L820 Inflamed seborrheic keratosis: Secondary | ICD-10-CM | POA: Diagnosis not present

## 2012-10-28 DIAGNOSIS — Z85828 Personal history of other malignant neoplasm of skin: Secondary | ICD-10-CM | POA: Diagnosis not present

## 2012-10-29 ENCOUNTER — Encounter: Payer: Self-pay | Admitting: Internal Medicine

## 2012-10-29 ENCOUNTER — Ambulatory Visit (INDEPENDENT_AMBULATORY_CARE_PROVIDER_SITE_OTHER): Payer: Medicare Other | Admitting: Internal Medicine

## 2012-10-29 VITALS — BP 132/80 | HR 96 | Temp 98.2°F | Resp 18 | Ht 67.0 in | Wt 162.0 lb

## 2012-10-29 DIAGNOSIS — E785 Hyperlipidemia, unspecified: Secondary | ICD-10-CM | POA: Diagnosis not present

## 2012-10-29 DIAGNOSIS — IMO0001 Reserved for inherently not codable concepts without codable children: Secondary | ICD-10-CM | POA: Diagnosis not present

## 2012-10-29 DIAGNOSIS — I10 Essential (primary) hypertension: Secondary | ICD-10-CM | POA: Diagnosis not present

## 2012-10-29 DIAGNOSIS — Z23 Encounter for immunization: Secondary | ICD-10-CM

## 2012-10-29 DIAGNOSIS — C61 Malignant neoplasm of prostate: Secondary | ICD-10-CM | POA: Diagnosis not present

## 2012-10-29 DIAGNOSIS — R21 Rash and other nonspecific skin eruption: Secondary | ICD-10-CM

## 2012-10-29 MED ORDER — INSULIN GLARGINE 100 UNIT/ML ~~LOC~~ SOLN: SUBCUTANEOUS | Status: DC

## 2012-10-29 MED ORDER — NYSTATIN-TRIAMCINOLONE 100000-0.1 UNIT/GM-% EX CREA: TOPICAL_CREAM | Freq: Four times a day (QID) | CUTANEOUS | Status: DC

## 2012-10-29 NOTE — Patient Instructions (Signed)
Increase Lantus to 45 units nightly.

## 2012-10-29 NOTE — Progress Notes (Signed)
Subjective:    Patient ID: Micheal [name], male    DOB: Oct 25, 1928, 79 y.o.   MRN: [id number]  Chief Complaint  Patient presents with  . Follow-up    flu vaccine given    HPI  Type II or unspecified type diabetes mellitus without mention of complication, uncontrolled: A1c high  Other and unspecified hyperlipidemia: elevated LDL. Intolerant to statins  Malignant neoplasm of prostate : Continues to see Dr. Isabel Caprice. Remains tender in the perineal area. Has nocturia x 3-4. Hx of prostate cancer.  Unspecified essential hypertension: controlled  Need for prophylactic vaccination and inoculation against influenza  Rash: irritation due to urine leakage.    Current Outpatient Prescriptions on File Prior to Visit  Medication Sig Dispense Refill  . acetaminophen (TYLENOL) 500 MG tablet Take 500 mg by mouth every 6 (six) hours as needed for pain. Take one tablet as needed for pain.      . B-D ULTRAFINE III SHORT PEN 31G X 8 MM MISC USE AS DIRECTED  100 each  4  . cholecalciferol (VITAMIN D) 1000 UNITS tablet Take 1,000 Units by mouth daily. Take 2 capsules daily for vitamin D supplement      . famotidine (PEPCID) 20 MG tablet Take 20 mg by mouth as needed. Take one tablet as needed for reflux.      . insulin glargine (LANTUS) 100 UNIT/ML injection Inject 40 units at bedtime for diabetes  5 pen  6  . insulin NPH-regular (HUMULIN 70/30 PEN) (70-30) 100 UNIT/ML injection Inject 10 units each morning to help control diabetes  15 mL  4  . nystatin-triamcinolone (MYCOLOG II) cream Apply topically 4 (four) times daily. Apply daily to rash      . tamsulosin (FLOMAX) 0.4 MG CAPS Take one capsule once day for prostate  90 capsule  3   No current facility-administered medications on file prior to visit.   Allergies  Allergen Reactions  . Atorvastatin     Muscular pain and weakness  . Metformin Hcl Nausea Only  . Simvastatin     Muscular pain and weakness  . Sulfa Antibiotics   . Voltaren  [Diclofenac Sodium]      Review of Systems  Constitutional: Positive for malaise/fatigue and fatigue. Negative for fever, chills, weight loss, diaphoresis, activity change and appetite change.  HENT: Positive for hearing loss.   Eyes: Positive for visual disturbance. Negative for blurred vision.       Severe visual problems related to macular degeneration, retinal hemorrhage,  Respiratory: Negative.  Negative for shortness of breath.   Cardiovascular: Negative.  Negative for palpitations and PND.  Endocrine: Negative for polydipsia, polyphagia and polyuria.       Diabetic. Poor control.  Genitourinary:       Hx prostate cancer. Also may have BPH. Followed by Dr. Isabel Caprice.  Musculoskeletal: Positive for arthralgias. Negative for back pain, gait problem, joint swelling, myalgias and neck pain.  Skin: Negative.  Negative for pallor.  Allergic/Immunologic: Negative.   Neurological: Positive for tremors and weakness. Negative for dizziness, seizures, speech difficulty and headaches.  Hematological: Negative.   Psychiatric/Behavioral: Negative for confusion. The patient is nervous/anxious.        Objective:BP 132/80  Pulse 96  Temp(Src) 98.2 F (36.8 C) (Oral)  Resp 18  Ht 5\' 7"  (1.702 m)  Wt 162 lb (73.483 kg)  BMI 25.37 kg/m2  SpO2 96%    Physical Exam  Constitutional: He is oriented to person, place, and time. He appears well-developed and  well-nourished.  HENT:  Head: Normocephalic and atraumatic.  Loss of hearing.  Eyes:  Severe visual losses due to macular degeneration  Neck: Normal range of motion. Neck supple. No JVD present. No tracheal deviation present. No thyromegaly present.  Cardiovascular: Normal rate, regular rhythm, normal heart sounds and intact distal pulses.   Pulmonary/Chest: Effort normal and breath sounds normal.  Abdominal: Soft. Bowel sounds are normal. He exhibits no mass. There is no guarding.  Musculoskeletal: Normal range of motion. He exhibits no  edema and no tenderness.  Lymphadenopathy:    He has no cervical adenopathy.  Neurological: He is alert and oriented to person, place, and time. No cranial nerve deficit. Coordination normal.  Skin: Skin is warm and dry.  Multiple SK.  Psychiatric: He has a normal mood and affect. His behavior is normal. Thought content normal.     Appointment on 10/23/2012  Component Date Value Range Status  . Cholesterol, Total 10/23/2012 249* 100 - 199 mg/dL Final  . Triglycerides 10/23/2012 193* 0 - 149 mg/dL Final  . HDL 96/04/5407 41  >39 mg/dL Final   Comment: According to ATP-III Guidelines, HDL-C >59 mg/dL is considered a                          negative risk factor for CHD.  Marland Kitchen VLDL Cholesterol Cal 10/23/2012 39  5 - 40 mg/dL Final  . LDL Calculated 10/23/2012 811* 0 - 99 mg/dL Final  . Chol/HDL Ratio 10/23/2012 6.1* 0.0 - 5.0 ratio units Final   Comment:                                   T. Chol/HDL Ratio                                                                      Men  Women                                                        1/2 Avg.Risk  3.4    3.3                                                            Avg.Risk  5.0    4.4                                                         2X Avg.Risk  9.6    7.1  3X Avg.Risk 23.4   11.0  . Hemoglobin A1C 10/23/2012 8.7* 4.8 - 5.6 % Final   Comment:          Increased risk for diabetes: 5.7 - 6.4                                   Diabetes: >6.4                                   Glycemic control for adults with diabetes: <7.0  . Estimated average glucose 10/23/2012 203   Final  . Glucose 10/23/2012 149* 65 - 99 mg/dL Final  . BUN 21/30/8657 14  8 - 27 mg/dL Final  . Creatinine, Ser 10/23/2012 1.01  0.76 - 1.27 mg/dL Final  . GFR calc non Af Amer 10/23/2012 69  >59 mL/min/1.73 Final  . GFR calc Af Amer 10/23/2012 80  >59 mL/min/1.73 Final  . BUN/Creatinine Ratio 10/23/2012 14   10 - 22 Final  . Sodium 10/23/2012 142  134 - 144 mmol/L Final  . Potassium 10/23/2012 4.5  3.5 - 5.2 mmol/L Final  . Chloride 10/23/2012 108  97 - 108 mmol/L Final  . CO2 10/23/2012 23  18 - 29 mmol/L Final  . Calcium 10/23/2012 11.1* 8.6 - 10.2 mg/dL Final        Assessment & Plan:  Type II or unspecified type diabetes mellitus without mention of complication, uncontrolled - Plan: increase insulin glargine (LANTUS) 100 UNIT/ML injection to 45 units hs  Other and unspecified hyperlipidemia: continue to obsereve  Malignant neoplasm of prostate: continues with discomfort in perineum  Unspecified essential hypertension: controlled  Need for prophylactic vaccination and inoculation against influenza: immunized  Rash and nonspecific skin eruption - Plan: nystatin-triamcinolone (MYCOLOG II) cream

## 2012-11-24 DIAGNOSIS — H353290 Exudative age-related macular degeneration, unspecified eye, stage unspecified: Secondary | ICD-10-CM | POA: Diagnosis not present

## 2012-12-04 DIAGNOSIS — Z961 Presence of intraocular lens: Secondary | ICD-10-CM | POA: Diagnosis not present

## 2012-12-04 DIAGNOSIS — H26499 Other secondary cataract, unspecified eye: Secondary | ICD-10-CM | POA: Diagnosis not present

## 2012-12-22 DIAGNOSIS — H353290 Exudative age-related macular degeneration, unspecified eye, stage unspecified: Secondary | ICD-10-CM | POA: Diagnosis not present

## 2012-12-25 DIAGNOSIS — Z09 Encounter for follow-up examination after completed treatment for conditions other than malignant neoplasm: Secondary | ICD-10-CM | POA: Diagnosis not present

## 2013-01-26 DIAGNOSIS — H353290 Exudative age-related macular degeneration, unspecified eye, stage unspecified: Secondary | ICD-10-CM | POA: Diagnosis not present

## 2013-02-23 DIAGNOSIS — H353290 Exudative age-related macular degeneration, unspecified eye, stage unspecified: Secondary | ICD-10-CM | POA: Diagnosis not present

## 2013-02-26 ENCOUNTER — Other Ambulatory Visit: Payer: Medicare Other

## 2013-02-26 DIAGNOSIS — IMO0001 Reserved for inherently not codable concepts without codable children: Secondary | ICD-10-CM

## 2013-02-26 DIAGNOSIS — E1165 Type 2 diabetes mellitus with hyperglycemia: Principal | ICD-10-CM

## 2013-02-27 LAB — BASIC METABOLIC PANEL
BUN/Creatinine Ratio: 15 (ref 10–22)
BUN: 13 mg/dL (ref 8–27)
CO2: 25 mmol/L (ref 18–29)
Calcium: 11.3 mg/dL — ABNORMAL HIGH (ref 8.6–10.2)
Chloride: 105 mmol/L (ref 97–108)
Creatinine, Ser: 0.87 mg/dL (ref 0.76–1.27)
GFR calc Af Amer: 92 mL/min/{1.73_m2} (ref >59)
GFR calc non Af Amer: 80 mL/min/{1.73_m2} (ref >59)
Glucose: 92 mg/dL (ref 65–99)
Potassium: 4.5 mmol/L (ref 3.5–5.2)
Sodium: 143 mmol/L (ref 134–144)

## 2013-02-27 LAB — HEMOGLOBIN A1C
Est. average glucose Bld gHb Est-mCnc: 192 mg/dL
Hgb A1c MFr Bld: 8.3 % — ABNORMAL HIGH (ref 4.8–5.6)

## 2013-03-04 ENCOUNTER — Ambulatory Visit (INDEPENDENT_AMBULATORY_CARE_PROVIDER_SITE_OTHER): Payer: Medicare Other | Admitting: Internal Medicine

## 2013-03-04 ENCOUNTER — Encounter: Payer: Self-pay | Admitting: Internal Medicine

## 2013-03-04 VITALS — BP 130/82 | HR 70 | Temp 96.7°F | Resp 16 | Wt 160.0 lb

## 2013-03-04 DIAGNOSIS — IMO0001 Reserved for inherently not codable concepts without codable children: Secondary | ICD-10-CM

## 2013-03-04 DIAGNOSIS — Z23 Encounter for immunization: Secondary | ICD-10-CM

## 2013-03-04 DIAGNOSIS — H3530 Unspecified macular degeneration: Secondary | ICD-10-CM

## 2013-03-04 DIAGNOSIS — N401 Enlarged prostate with lower urinary tract symptoms: Secondary | ICD-10-CM

## 2013-03-04 DIAGNOSIS — E1165 Type 2 diabetes mellitus with hyperglycemia: Principal | ICD-10-CM

## 2013-03-04 DIAGNOSIS — I10 Essential (primary) hypertension: Secondary | ICD-10-CM | POA: Diagnosis not present

## 2013-03-04 DIAGNOSIS — R21 Rash and other nonspecific skin eruption: Secondary | ICD-10-CM | POA: Diagnosis not present

## 2013-03-04 DIAGNOSIS — N138 Other obstructive and reflux uropathy: Secondary | ICD-10-CM

## 2013-03-04 DIAGNOSIS — E785 Hyperlipidemia, unspecified: Secondary | ICD-10-CM

## 2013-03-04 MED ORDER — NYSTATIN-TRIAMCINOLONE 100000-0.1 UNIT/GM-% EX CREA: TOPICAL_CREAM | Freq: Four times a day (QID) | CUTANEOUS | Status: DC

## 2013-03-04 MED ORDER — ZOSTER VACCINE LIVE 19400 UNT/0.65ML ~~LOC~~ SOLR: 0.6500 mL | Freq: Once | SUBCUTANEOUS | Status: DC

## 2013-03-04 MED ORDER — INSULIN GLARGINE 100 UNIT/ML ~~LOC~~ SOLN: SUBCUTANEOUS | Status: DC

## 2013-03-04 MED ORDER — INSULIN NPH ISOPHANE & REGULAR (70-30) 100 UNIT/ML ~~LOC~~ SUSP: SUBCUTANEOUS | Status: DC

## 2013-03-04 NOTE — Patient Instructions (Signed)
Continue medications as listed 

## 2013-03-04 NOTE — Progress Notes (Signed)
Patient ID: Micheal [name], male   DOB: 1928-04-15, 80 y.o.   MRN: [id number]    Location:    [facility]  Place of Service:  OFFICE    Allergies  Allergen Reactions  . Atorvastatin     Muscular pain and weakness  . Metformin Hcl Nausea Only  . Simvastatin     Muscular pain and weakness  . Sulfa Antibiotics   . Voltaren [Diclofenac Sodium]     Chief Complaint  Patient presents with  . Medical Managment of Chronic Issues    4 month f/u &  discuss labs. (printed)  . other    only taking 20 units of Lantus instead of the 45 units. BS aberaging 105-162 pre-prandially & 144- 208 post-prandially  . Immunizations    shingles RX to be printed    HPI:  Rash and nonspecific skin eruption - Plan: nystatin-triamcinolone (MYCOLOG II) cream  Type II or unspecified type diabetes mellitus without mention of complication, uncontrolled: He could not afford to refill his Lantus last visit due to "the donut Hole"  Unspecified essential hypertension: controlled  Macular degeneration (senile) of retina, unspecified: vision is improving!  Hypertrophy of prostate with urinary obstruction and other lower urinary tract symptoms (LUTS): continues with hesitation and frequency. Benefits from tamsulosin.  Other and unspecified hyperlipidemia: stable recheck next visit    Medications: Patient's Medications  New Prescriptions   No medications on file  Previous Medications   ACETAMINOPHEN (TYLENOL) 500 MG TABLET    Take 500 mg by mouth every 6 (six) hours as needed for pain. Take one tablet as needed for pain.   B-D ULTRAFINE III SHORT PEN 31G X 8 MM MISC    USE AS DIRECTED   CHOLECALCIFEROL (VITAMIN D) 1000 UNITS TABLET    Take 1,000 Units by mouth daily. Take 2 capsules daily for vitamin D supplement   FAMOTIDINE (PEPCID) 20 MG TABLET    Take 20 mg by mouth as needed. Take one tablet as needed for reflux.   INSULIN NPH-REGULAR (HUMULIN 70/30 PEN) (70-30) 100 UNIT/ML INJECTION    Inject 10 units each  morning to help control diabetes   NYSTATIN-TRIAMCINOLONE (MYCOLOG II) CREAM    Apply topically 4 (four) times daily. Apply daily to rash   TAMSULOSIN (FLOMAX) 0.4 MG CAPS    Take one capsule once day for prostate   ZOSTER VACCINE LIVE, PF, (ZOSTAVAX) 16109 UNT/0.65ML INJECTION    Inject 0.65 mLs into the skin once.  Modified Medications   Modified Medication Previous Medication   INSULIN GLARGINE (LANTUS) 100 UNIT/ML INJECTION insulin glargine (LANTUS) 100 UNIT/ML injection      Inject 20 Units into the skin at bedtime. Inject 20 units at bedtime for diabetes    Inject 45 units at bedtime for diabetes  Discontinued Medications   HYDROCODONE-ACETAMINOPHEN (NORCO/VICODIN) 5-325 MG PER TABLET    Take by mouth every 6 (six) hours as needed (Pt states that he take 1/2 tablet as needed).      Review of Systems  Constitutional: Positive for malaise/fatigue and fatigue. Negative for fever, chills, weight loss, diaphoresis, activity change and appetite change.  HENT: Positive for hearing loss.   Eyes: Positive for visual disturbance. Negative for blurred vision.       Severe visual problems related to macular degeneration, retinal hemorrhage,  Respiratory: Negative.  Negative for shortness of breath.   Cardiovascular: Negative.  Negative for palpitations and PND.  Endocrine: Negative for polydipsia, polyphagia and polyuria.  Diabetic. Poor control.  Genitourinary:       Hx prostate cancer. Also may have BPH. Followed by Dr. Risa Grill.  Musculoskeletal: Positive for arthralgias. Negative for back pain, gait problem, joint swelling, myalgias and neck pain.  Skin: Negative.  Negative for pallor.  Allergic/Immunologic: Negative.   Neurological: Positive for tremors and weakness. Negative for dizziness, seizures, speech difficulty and headaches.  Hematological: Negative.   Psychiatric/Behavioral: Negative for confusion. The patient is nervous/anxious.     Filed Vitals:   03/04/13 1542  BP:  130/82  Pulse: 70  Temp: 96.7 F (35.9 C)  TempSrc: Oral  Resp: 16  Weight: 160 lb (72.576 kg)   Physical Exam  Constitutional: He is oriented to person, place, and time. He appears well-developed and well-nourished.  HENT:  Head: Normocephalic and atraumatic.  Loss of hearing.  Eyes:  Severe visual losses due to macular degeneration  Neck: Normal range of motion. Neck supple. No JVD present. No tracheal deviation present. No thyromegaly present.  Cardiovascular: Normal rate, regular rhythm, normal heart sounds and intact distal pulses.   Pulmonary/Chest: Effort normal and breath sounds normal.  Abdominal: Soft. Bowel sounds are normal. He exhibits no mass. There is no guarding.  Musculoskeletal: Normal range of motion. He exhibits no edema and no tenderness.  Lymphadenopathy:    He has no cervical adenopathy.  Neurological: He is alert and oriented to person, place, and time. No cranial nerve deficit. Coordination normal.  Skin: Skin is warm and dry.  Multiple SK.  Psychiatric: He has a normal mood and affect. His behavior is normal. Thought content normal.     Labs reviewed: Appointment on 02/26/2013  Component Date Value Ref Range Status  . Hemoglobin A1C 02/26/2013 8.3* 4.8 - 5.6 % Final   Comment:          Increased risk for diabetes: 5.7 - 6.4                                   Diabetes: >6.4                                   Glycemic control for adults with diabetes: <7.0  . Estimated average glucose 02/26/2013 192   Final  . Glucose 02/26/2013 92  65 - 99 mg/dL Final  . BUN 02/26/2013 13  8 - 27 mg/dL Final  . Creatinine, Ser 02/26/2013 0.87  0.76 - 1.27 mg/dL Final  . GFR calc non Af Amer 02/26/2013 80  >59 mL/min/1.73 Final  . GFR calc Af Amer 02/26/2013 92  >59 mL/min/1.73 Final  . BUN/Creatinine Ratio 02/26/2013 15  10 - 22 Final  . Sodium 02/26/2013 143  134 - 144 mmol/L Final  . Potassium 02/26/2013 4.5  3.5 - 5.2 mmol/L Final  . Chloride 02/26/2013 105  97 -  108 mmol/L Final  . CO2 02/26/2013 25  18 - 29 mmol/L Final  . Calcium 02/26/2013 11.3* 8.6 - 10.2 mg/dL Final      Assessment/Plan  1. Rash and nonspecific skin eruption - nystatin-triamcinolone (MYCOLOG II) cream; Apply topically 4 (four) times daily. Apply daily to rash  Dispense: 30 g; Refill: 3  2. Need for prophylactic vaccination and inoculation against other viral diseases(V04.89) - zoster vaccine live, PF, (ZOSTAVAX) 24235 UNT/0.65ML injection; Inject 19,400 Units into the skin once.  Dispense: 1 each; Refill:  0  3. Type II or unspecified type diabetes mellitus without mention of complication, uncontrolled - insulin glargine (LANTUS) 100 UNIT/ML injection; Inject 20 units at bedtime for diabetes.  Dispense: 10 mL; Refill: 2 - insulin NPH-regular Human (NOVOLIN 70/30) (70-30) 100 UNIT/ML injection; Inject 10 units each morning to help control diabetes  Dispense: 15 mL; Refill: 4 - Hemoglobin A1c; Future - Basic metabolic panel; Future  4. Unspecified essential hypertension controlled  5. Macular degeneration (senile) of retina, unspecified Continue with ophth  6. Hypertrophy of prostate with urinary obstruction and other lower urinary tract symptoms (LUTS) Continue tamsulosin  7. Other and unspecified hyperlipidemia Recheck next visit - Lipid panel; Future

## 2013-03-05 ENCOUNTER — Other Ambulatory Visit: Payer: Self-pay | Admitting: Internal Medicine

## 2013-03-07 ENCOUNTER — Encounter (HOSPITAL_COMMUNITY): Payer: Self-pay | Admitting: Emergency Medicine

## 2013-03-07 ENCOUNTER — Emergency Department (HOSPITAL_COMMUNITY): Payer: Medicare Other

## 2013-03-07 ENCOUNTER — Emergency Department (HOSPITAL_COMMUNITY)
Admission: EM | Admit: 2013-03-07 | Discharge: 2013-03-07 | Disposition: A | Discharge status: Home / Self Care | Payer: Medicare Other | Attending: Emergency Medicine | Admitting: Emergency Medicine

## 2013-03-07 DIAGNOSIS — Z79899 Other long term (current) drug therapy: Secondary | ICD-10-CM | POA: Insufficient documentation

## 2013-03-07 DIAGNOSIS — R000 Tachycardia, unspecified: Secondary | ICD-10-CM | POA: Diagnosis not present

## 2013-03-07 DIAGNOSIS — R141 Gas pain: Secondary | ICD-10-CM | POA: Diagnosis not present

## 2013-03-07 DIAGNOSIS — I10 Essential (primary) hypertension: Secondary | ICD-10-CM | POA: Insufficient documentation

## 2013-03-07 DIAGNOSIS — R112 Nausea with vomiting, unspecified: Secondary | ICD-10-CM | POA: Diagnosis not present

## 2013-03-07 DIAGNOSIS — K219 Gastro-esophageal reflux disease without esophagitis: Secondary | ICD-10-CM | POA: Diagnosis not present

## 2013-03-07 DIAGNOSIS — R1084 Generalized abdominal pain: Secondary | ICD-10-CM | POA: Insufficient documentation

## 2013-03-07 DIAGNOSIS — Z9089 Acquired absence of other organs: Secondary | ICD-10-CM | POA: Diagnosis not present

## 2013-03-07 DIAGNOSIS — R143 Flatulence: Secondary | ICD-10-CM | POA: Diagnosis not present

## 2013-03-07 DIAGNOSIS — N401 Enlarged prostate with lower urinary tract symptoms: Secondary | ICD-10-CM | POA: Insufficient documentation

## 2013-03-07 DIAGNOSIS — R1110 Vomiting, unspecified: Secondary | ICD-10-CM

## 2013-03-07 DIAGNOSIS — Z8669 Personal history of other diseases of the nervous system and sense organs: Secondary | ICD-10-CM | POA: Insufficient documentation

## 2013-03-07 DIAGNOSIS — M1990 Unspecified osteoarthritis, unspecified site: Secondary | ICD-10-CM | POA: Diagnosis not present

## 2013-03-07 DIAGNOSIS — K863 Pseudocyst of pancreas: Secondary | ICD-10-CM

## 2013-03-07 DIAGNOSIS — K862 Cyst of pancreas: Secondary | ICD-10-CM | POA: Insufficient documentation

## 2013-03-07 DIAGNOSIS — N138 Other obstructive and reflux uropathy: Secondary | ICD-10-CM | POA: Insufficient documentation

## 2013-03-07 DIAGNOSIS — Z794 Long term (current) use of insulin: Secondary | ICD-10-CM | POA: Diagnosis not present

## 2013-03-07 DIAGNOSIS — E1165 Type 2 diabetes mellitus with hyperglycemia: Secondary | ICD-10-CM

## 2013-03-07 DIAGNOSIS — R109 Unspecified abdominal pain: Secondary | ICD-10-CM

## 2013-03-07 DIAGNOSIS — E559 Vitamin D deficiency, unspecified: Secondary | ICD-10-CM | POA: Diagnosis not present

## 2013-03-07 DIAGNOSIS — Z9889 Other specified postprocedural states: Secondary | ICD-10-CM | POA: Diagnosis not present

## 2013-03-07 DIAGNOSIS — N281 Cyst of kidney, acquired: Secondary | ICD-10-CM | POA: Diagnosis not present

## 2013-03-07 DIAGNOSIS — Z8546 Personal history of malignant neoplasm of prostate: Secondary | ICD-10-CM | POA: Diagnosis not present

## 2013-03-07 DIAGNOSIS — R142 Eructation: Secondary | ICD-10-CM | POA: Diagnosis not present

## 2013-03-07 DIAGNOSIS — IMO0001 Reserved for inherently not codable concepts without codable children: Secondary | ICD-10-CM | POA: Diagnosis not present

## 2013-03-07 LAB — BLOOD GAS, ARTERIAL
Acid-base deficit: 3 mmol/L — ABNORMAL HIGH (ref 0.0–2.0)
Bicarbonate: 20.8 mEq/L (ref 20.0–24.0)
Drawn by: 11249
FIO2: 0.21 %
O2 Saturation: 90.8 %
Patient temperature: 98.6
TCO2: 18.5 mmol/L (ref 0–100)
pCO2 arterial: 34.9 mmHg — ABNORMAL LOW (ref 35.0–45.0)
pH, Arterial: 7.392 (ref 7.350–7.450)
pO2, Arterial: 61.8 mmHg — ABNORMAL LOW (ref 80.0–100.0)

## 2013-03-07 LAB — URINALYSIS, ROUTINE W REFLEX MICROSCOPIC
Bilirubin Urine: NEGATIVE
Glucose, UA: 250 mg/dL — AB
Hgb urine dipstick: NEGATIVE
Ketones, ur: 15 mg/dL — AB
Leukocytes, UA: NEGATIVE
Nitrite: NEGATIVE
Protein, ur: NEGATIVE mg/dL
Specific Gravity, Urine: 1.026 (ref 1.005–1.030)
Urobilinogen, UA: 0.2 mg/dL (ref 0.0–1.0)
pH: 5.5 (ref 5.0–8.0)

## 2013-03-07 LAB — COMPREHENSIVE METABOLIC PANEL
ALT: 18 U/L (ref 0–53)
AST: 18 U/L (ref 0–37)
Albumin: 4.4 g/dL (ref 3.5–5.2)
Alkaline Phosphatase: 107 U/L (ref 39–117)
BUN: 23 mg/dL (ref 6–23)
CO2: 23 mEq/L (ref 19–32)
Calcium: 11 mg/dL — ABNORMAL HIGH (ref 8.4–10.5)
Chloride: 102 mEq/L (ref 96–112)
Creatinine, Ser: 1 mg/dL (ref 0.50–1.35)
GFR calc Af Amer: 78 mL/min — ABNORMAL LOW (ref >90)
GFR calc non Af Amer: 67 mL/min — ABNORMAL LOW (ref >90)
Glucose, Bld: 270 mg/dL — ABNORMAL HIGH (ref 70–99)
Potassium: 4.3 mEq/L (ref 3.7–5.3)
Sodium: 140 mEq/L (ref 137–147)
Total Bilirubin: 0.7 mg/dL (ref 0.3–1.2)
Total Protein: 7.1 g/dL (ref 6.0–8.3)

## 2013-03-07 LAB — CBC
HCT: 43.1 % (ref 39.0–52.0)
Hemoglobin: 15.1 g/dL (ref 13.0–17.0)
MCH: 30.9 pg (ref 26.0–34.0)
MCHC: 35 g/dL (ref 30.0–36.0)
MCV: 88.3 fL (ref 78.0–100.0)
Platelets: 211 10*3/uL (ref 150–400)
RBC: 4.88 MIL/uL (ref 4.22–5.81)
RDW: 12.5 % (ref 11.5–15.5)
WBC: 14.9 10*3/uL — ABNORMAL HIGH (ref 4.0–10.5)

## 2013-03-07 LAB — LIPASE, BLOOD: Lipase: 22 U/L (ref 11–59)

## 2013-03-07 MED ORDER — IOHEXOL 300 MG/ML  SOLN
100.0000 mL | Freq: Once | INTRAMUSCULAR | Status: AC | PRN
Start: 2013-03-07 — End: 2013-03-07
  Administered 2013-03-07: 100 mL via INTRAVENOUS

## 2013-03-07 MED ORDER — IOHEXOL 300 MG/ML  SOLN
50.0000 mL | Freq: Once | INTRAMUSCULAR | Status: AC | PRN
  Administered 2013-03-07: 50 mL via ORAL

## 2013-03-07 MED ORDER — SODIUM CHLORIDE 0.9 % IV SOLN
INTRAVENOUS | Status: DC
  Administered 2013-03-07: 1000 mL via INTRAVENOUS

## 2013-03-07 MED ORDER — ONDANSETRON HCL 4 MG PO TABS: 4.0000 mg | ORAL_TABLET | Freq: Four times a day (QID) | ORAL | Status: DC

## 2013-03-07 MED ORDER — ONDANSETRON HCL 4 MG/2ML IJ SOLN
4.0000 mg | Freq: Once | INTRAMUSCULAR | Status: AC
  Administered 2013-03-07: 4 mg via INTRAVENOUS
  Filled 2013-03-07: qty 2

## 2013-03-07 MED ORDER — SODIUM CHLORIDE 0.9 % IV BOLUS (SEPSIS)
1000.0000 mL | Freq: Once | INTRAVENOUS | Status: AC
  Administered 2013-03-07: 1000 mL via INTRAVENOUS

## 2013-03-07 MED ORDER — FENTANYL CITRATE 0.05 MG/ML IJ SOLN
50.0000 ug | INTRAMUSCULAR | Status: DC | PRN
  Administered 2013-03-07: 50 ug via INTRAVENOUS
  Filled 2013-03-07: qty 2

## 2013-03-07 NOTE — Progress Notes (Signed)
RT placed 2 liter nasal canula on pt per MD.

## 2013-03-07 NOTE — Discharge Instructions (Signed)
Abdominal Pain, Adult °Many things can cause abdominal pain. Usually, abdominal pain is not caused by a disease and will improve without treatment. It can often be observed and treated at home. Your health care provider will do a physical exam and possibly order blood tests and X-rays to help determine the seriousness of your pain. However, in many cases, more time must pass before a clear cause of the pain can be found. Before that point, your health care provider may not know if you need more testing or further treatment. °HOME CARE INSTRUCTIONS  °Monitor your abdominal pain for any changes. The following actions may help to alleviate any discomfort you are experiencing: °· Only take over-the-counter or prescription medicines as directed by your health care provider. °· Do not take laxatives unless directed to do so by your health care provider. °· Try a clear liquid diet (broth, tea, or water) as directed by your health care provider. Slowly move to a bland diet as tolerated. °SEEK MEDICAL CARE IF: °· You have unexplained abdominal pain. °· You have abdominal pain associated with nausea or diarrhea. °· You have pain when you urinate or have a bowel movement. °· You experience abdominal pain that wakes you in the night. °· You have abdominal pain that is worsened or improved by eating food. °· You have abdominal pain that is worsened with eating fatty foods. °SEEK IMMEDIATE MEDICAL CARE IF:  °· Your pain does not go away within 2 hours. °· You have a fever. °· You keep throwing up (vomiting). °· Your pain is felt only in portions of the abdomen, such as the right side or the left lower portion of the abdomen. °· You pass bloody or black tarry stools. °MAKE SURE YOU: °· Understand these instructions.   °· Will watch your condition.   °· Will get help right away if you are not doing well or get worse.   °Document Released: 10/18/2004 Document Revised: 10/29/2012 Document Reviewed: 09/17/2012 °[employer]® Patient  Information ©2014 [employer]. ° °

## 2013-03-07 NOTE — ED Provider Notes (Signed)
CSN: [id number]     Arrival date & time 03/07/13  0401 History   First MD Initiated Contact with Patient 03/07/13 0421     Chief Complaint  Patient presents with  . Abdominal Pain     (Consider location/radiation/quality/duration/timing/severity/associated sxs/prior Treatment) HPI History provided by patient. Lives at home with wife, has history of diabetes, multiple abdominal surgeries, pancreatitis - developed nausea vomiting with abdominal pain around 10 PM tonight. Since that time has had multiple bouts of emesis, nonbloody nonbilious. He has had one normal bowel movement without diarrhea or constipation. He continues to have mild abdominal pain midabdomen, cramping. No chest pain. No shortness of breath. Symptoms moderate severity. No sick contacts. No fevers. No known alleviating factors Past Medical History  Diagnosis Date  . Malignant neoplasm of prostate   . Type II or unspecified type diabetes mellitus without mention of complication, not stated as uncontrolled   . Type II or unspecified type diabetes mellitus without mention of complication, uncontrolled   . Vitamin D deficiency   . Other and unspecified hyperlipidemia   . Restless legs syndrome (RLS)   . Carpal tunnel syndrome   . Retinal detachment with retinal defect, unspecified   . Macular degeneration (senile) of retina, unspecified   . Unspecified essential hypertension   . Right bundle branch block     Incomplete  . Paroxysmal supraventricular tachycardia   . Internal hemorrhoids without mention of complication   . Orthostatic hypotension   . Allergic rhinitis, cause unspecified   . Esophageal reflux   . Diverticulosis of colon (without mention of hemorrhage)   . Irritable bowel syndrome   . Other specified disorder of rectum and anus     Radiation Proctitus  . Neurogenic bladder, NOS   . Hypertrophy of prostate with urinary obstruction and other lower urinary tract symptoms (LUTS)   . Spermatocele     Right   . Osteoarthrosis, unspecified whether generalized or localized, unspecified site   . Cervical spondylosis without myelopathy   . Cervicalgia   . Cervical spondylosis without myelopathy   . Trigger finger (acquired)   . Insomnia, unspecified   . Other malaise and fatigue   . Nonspecific (abnormal) findings on radiological and other examination of gastrointestinal tract    Past Surgical History  Procedure Laterality Date  . Appendectomy  1951  . Polypectomy  1955    Vocal cord  . Angiolipoma  1980    Right arm  . Knee surgery  1979  . Dupuytren contracture release Bilateral 1989  . Transurethral resection of prostate  08/1989  . Cholecystectomy  01/1994  . Eye surgery  2006    Left  . Eye surgery  2006    Retina reattachment, right  . Eye surgery  02/1994    Cataract surgery, right  . Eye surgery  01/1995    Cataract surgery, left  . Tendon release  01/1997    Fourth finger, left  . Retinal detachment surgery Right 2005  . Colonoscopy w/ polypectomy  2005    Removed 2 (two) polyps  . Colonoscopy  04/11/2010   Family History  Problem Relation Age of Onset  . Depression Mother   . Diabetes Mother   . Mental illness Mother     OCD  . Hypertension Sister   . Diabetes Daughter   . Hyperlipidemia Daughter    History  Substance Use Topics  . Smoking status: Never Smoker   . Smokeless tobacco: Not on file  . Alcohol  Use: No    Review of Systems  Constitutional: Negative for fever and chills.  Respiratory: Negative for shortness of breath.   Cardiovascular: Negative for chest pain.  Gastrointestinal: Positive for nausea, vomiting and abdominal pain. Negative for blood in stool.  Genitourinary: Negative for dysuria and flank pain.  Musculoskeletal: Negative for back pain.  Skin: Negative for rash.  Neurological: Negative for weakness and numbness.  All other systems reviewed and are negative.      Allergies  Atorvastatin; Metformin hcl; Simvastatin; Sulfa  antibiotics; and Voltaren  Home Medications   Current Outpatient Rx  Name  Route  Sig  Dispense  Refill  . acetaminophen (TYLENOL) 500 MG tablet   Oral   Take 500 mg by mouth every 6 (six) hours as needed for pain. Take one tablet as needed for pain.         . cholecalciferol (VITAMIN D) 1000 UNITS tablet   Oral   Take 1,000 Units by mouth daily. Take 2 capsules daily for vitamin D supplement         . famotidine (PEPCID) 20 MG tablet   Oral   Take 20 mg by mouth as needed. Take one tablet as needed for reflux.         . insulin glargine (LANTUS) 100 UNIT/ML injection      Inject 20 units at bedtime for diabetes.   10 mL   2   . insulin NPH-regular Human (NOVOLIN 70/30) (70-30) 100 UNIT/ML injection      Inject 10 units each morning to help control diabetes   15 mL   4   . Multiple Vitamin (MULTIVITAMIN WITH MINERALS) TABS tablet   Oral   Take 1 tablet by mouth daily.         Marland Kitchen nystatin-triamcinolone (MYCOLOG II) cream   Topical   Apply topically 4 (four) times daily. Apply daily to rash   30 g   3   . tamsulosin (FLOMAX) 0.4 MG CAPS      Take one capsule once day for prostate   90 capsule   3    BP 140/70  Pulse 124  Temp(Src) 97.9 F (36.6 C) (Oral)  Resp 16  SpO2 95% Physical Exam  Constitutional: He is oriented to person, place, and time. He appears well-developed and well-nourished.  HENT:  Head: Normocephalic and atraumatic.  Dry mucous membranes  Eyes: EOM are normal. Pupils are equal, round, and reactive to light.  Neck: Neck supple.  Cardiovascular: Regular rhythm and intact distal pulses.   Tachycardic  Pulmonary/Chest: Effort normal and breath sounds normal. No respiratory distress.  Abdominal: Soft. Bowel sounds are normal. He exhibits no distension and no mass. There is no rebound and no guarding.  Mild diffuse abdominal tenderness  Musculoskeletal: Normal range of motion. He exhibits no edema.  Neurological: He is alert and  oriented to person, place, and time.  Skin: Skin is warm and dry.    ED Course  Procedures (including critical care time) Labs Review Labs Reviewed  BLOOD GAS, ARTERIAL - Abnormal; Notable for the following:    pCO2 arterial 34.9 (*)    pO2, Arterial 61.8 (*)    Acid-base deficit 3.0 (*)    All other components within normal limits  CBC - Abnormal; Notable for the following:    WBC 14.9 (*)    All other components within normal limits  COMPREHENSIVE METABOLIC PANEL - Abnormal; Notable for the following:    Glucose, Bld 270 (*)  Calcium 11.0 (*)    GFR calc non Af Amer 67 (*)    GFR calc Af Amer 78 (*)    All other components within normal limits  URINALYSIS, ROUTINE W REFLEX MICROSCOPIC - Abnormal; Notable for the following:    Glucose, UA 250 (*)    Ketones, ur 15 (*)    All other components within normal limits  LIPASE, BLOOD   Imaging Review Dg Abd Acute W/chest  03/07/2013   CLINICAL DATA:  Abdominal pain, emesis, diarrhea  EXAM: ACUTE ABDOMEN SERIES (ABDOMEN 2 VIEW & CHEST 1 VIEW)  COMPARISON:  Prior study from 12/29/2010.  FINDINGS: Cardiac and mediastinal silhouettes are stable in size and contour, and remain within normal limits.  Lungs are normally inflated. Chronic coarsening of the lung markings is noted. No focal infiltrate, pulmonary edema, or pleural effusion. Minimal bibasilar atelectasis is present. No pneumothorax.  Few scattered nondilated loops of bowel are seen scattered throughout the abdomen. No evidence of obstruction or ileus. Moderately large amount of retained stool seen within the colon. Cholecystectomy clips overlie the right upper quadrant. No soft tissue mass or abnormal calcifications.  No acute osseous abnormality.  IMPRESSION: 1. Nonobstructive bowel gas pattern. Moderately large amount of retained stool throughout the colon. 2. Mild bibasilar atelectasis. No acute cardiopulmonary abnormality.   Electronically Signed   By: Jeannine Boga M.D.    On: 03/07/2013 05:28    EKG Interpretation   None      IV fluids. IV Zofran. IV fentanyl 6:34 AM pain improved, no emesis in the ER, tolerating by mouth contrast  MDM   Final diagnoses:  Abdominal pain  Vomiting  Cyst of pancreas   Xray, CT scan given pain, emesis and h/o SBO.  Labs reviewed. Condition improved with IVFs, and medications. VS and nurses notes reviewed.    Teressa Lower, MD 03/09/13 [phone]

## 2013-03-07 NOTE — ED Notes (Signed)
Pt c/o abd pain onset 2200 last night, emesis x 12, 1 small episode of diarrhea.

## 2013-03-07 NOTE — ED Notes (Signed)
He states he feels better; and he is in no distress.  He is chipper, and his wife is with him.

## 2013-03-23 DIAGNOSIS — H353290 Exudative age-related macular degeneration, unspecified eye, stage unspecified: Secondary | ICD-10-CM | POA: Diagnosis not present

## 2013-03-30 ENCOUNTER — Other Ambulatory Visit: Payer: Self-pay | Admitting: *Deleted

## 2013-03-30 MED ORDER — TAMSULOSIN HCL 0.4 MG PO CAPS: ORAL_CAPSULE | ORAL | Status: DC

## 2013-03-30 NOTE — Telephone Encounter (Signed)
[facility] 

## 2013-04-10 DIAGNOSIS — H353290 Exudative age-related macular degeneration, unspecified eye, stage unspecified: Secondary | ICD-10-CM | POA: Diagnosis not present

## 2013-04-10 DIAGNOSIS — Z8546 Personal history of malignant neoplasm of prostate: Secondary | ICD-10-CM | POA: Insufficient documentation

## 2013-04-22 DIAGNOSIS — Z01818 Encounter for other preprocedural examination: Secondary | ICD-10-CM | POA: Diagnosis not present

## 2013-04-22 DIAGNOSIS — C61 Malignant neoplasm of prostate: Secondary | ICD-10-CM | POA: Diagnosis not present

## 2013-04-22 DIAGNOSIS — N400 Enlarged prostate without lower urinary tract symptoms: Secondary | ICD-10-CM | POA: Insufficient documentation

## 2013-04-23 DIAGNOSIS — H353290 Exudative age-related macular degeneration, unspecified eye, stage unspecified: Secondary | ICD-10-CM | POA: Diagnosis not present

## 2013-04-23 DIAGNOSIS — H3560 Retinal hemorrhage, unspecified eye: Secondary | ICD-10-CM | POA: Diagnosis not present

## 2013-04-23 DIAGNOSIS — Z794 Long term (current) use of insulin: Secondary | ICD-10-CM | POA: Diagnosis not present

## 2013-04-23 DIAGNOSIS — Z79899 Other long term (current) drug therapy: Secondary | ICD-10-CM | POA: Diagnosis not present

## 2013-04-23 DIAGNOSIS — E119 Type 2 diabetes mellitus without complications: Secondary | ICD-10-CM | POA: Diagnosis not present

## 2013-04-23 DIAGNOSIS — N400 Enlarged prostate without lower urinary tract symptoms: Secondary | ICD-10-CM | POA: Diagnosis not present

## 2013-04-23 HISTORY — PX: VITRECTOMY: SHX106

## 2013-04-24 DIAGNOSIS — H3562 Retinal hemorrhage, left eye: Secondary | ICD-10-CM | POA: Insufficient documentation

## 2013-04-30 DIAGNOSIS — C61 Malignant neoplasm of prostate: Secondary | ICD-10-CM | POA: Diagnosis not present

## 2013-05-18 DIAGNOSIS — H353290 Exudative age-related macular degeneration, unspecified eye, stage unspecified: Secondary | ICD-10-CM | POA: Diagnosis not present

## 2013-06-29 DIAGNOSIS — H353290 Exudative age-related macular degeneration, unspecified eye, stage unspecified: Secondary | ICD-10-CM | POA: Diagnosis not present

## 2013-07-03 ENCOUNTER — Other Ambulatory Visit: Payer: Medicare Other

## 2013-07-08 ENCOUNTER — Ambulatory Visit: Payer: Medicare Other | Admitting: Internal Medicine

## 2013-07-13 DIAGNOSIS — H353290 Exudative age-related macular degeneration, unspecified eye, stage unspecified: Secondary | ICD-10-CM | POA: Diagnosis not present

## 2013-07-15 ENCOUNTER — Other Ambulatory Visit: Payer: Self-pay | Admitting: Internal Medicine

## 2013-07-16 ENCOUNTER — Other Ambulatory Visit: Payer: Medicare Other

## 2013-07-16 DIAGNOSIS — IMO0001 Reserved for inherently not codable concepts without codable children: Secondary | ICD-10-CM

## 2013-07-16 DIAGNOSIS — E1165 Type 2 diabetes mellitus with hyperglycemia: Principal | ICD-10-CM

## 2013-07-16 DIAGNOSIS — E785 Hyperlipidemia, unspecified: Secondary | ICD-10-CM | POA: Diagnosis not present

## 2013-07-17 LAB — BASIC METABOLIC PANEL
BUN/Creatinine Ratio: 17 (ref 10–22)
BUN: 16 mg/dL (ref 8–27)
CO2: 23 mmol/L (ref 18–29)
Calcium: 11.3 mg/dL — ABNORMAL HIGH (ref 8.6–10.2)
Chloride: 105 mmol/L (ref 97–108)
Creatinine, Ser: 0.92 mg/dL (ref 0.76–1.27)
GFR calc Af Amer: 89 mL/min/{1.73_m2} (ref >59)
GFR calc non Af Amer: 77 mL/min/{1.73_m2} (ref >59)
Glucose: 167 mg/dL — ABNORMAL HIGH (ref 65–99)
Potassium: 4.8 mmol/L (ref 3.5–5.2)
Sodium: 140 mmol/L (ref 134–144)

## 2013-07-17 LAB — HEMOGLOBIN A1C
Est. average glucose Bld gHb Est-mCnc: 200 mg/dL
Hgb A1c MFr Bld: 8.6 % — ABNORMAL HIGH (ref 4.8–5.6)

## 2013-07-17 LAB — LIPID PANEL
Chol/HDL Ratio: 7.2 ratio units — ABNORMAL HIGH (ref 0.0–5.0)
Cholesterol, Total: 252 mg/dL — ABNORMAL HIGH (ref 100–199)
HDL: 35 mg/dL — ABNORMAL LOW (ref >39)
LDL Calculated: 163 mg/dL — ABNORMAL HIGH (ref 0–99)
Triglycerides: 270 mg/dL — ABNORMAL HIGH (ref 0–149)
VLDL Cholesterol Cal: 54 mg/dL — ABNORMAL HIGH (ref 5–40)

## 2013-07-22 ENCOUNTER — Ambulatory Visit (INDEPENDENT_AMBULATORY_CARE_PROVIDER_SITE_OTHER): Payer: Medicare Other | Admitting: Internal Medicine

## 2013-07-22 ENCOUNTER — Encounter: Payer: Self-pay | Admitting: Internal Medicine

## 2013-07-22 VITALS — BP 126/84 | HR 96 | Temp 97.4°F | Wt 157.0 lb

## 2013-07-22 DIAGNOSIS — H3560 Retinal hemorrhage, unspecified eye: Secondary | ICD-10-CM

## 2013-07-22 DIAGNOSIS — H353233 Exudative age-related macular degeneration, bilateral, with inactive scar: Secondary | ICD-10-CM | POA: Insufficient documentation

## 2013-07-22 DIAGNOSIS — E1165 Type 2 diabetes mellitus with hyperglycemia: Secondary | ICD-10-CM

## 2013-07-22 DIAGNOSIS — IMO0001 Reserved for inherently not codable concepts without codable children: Secondary | ICD-10-CM | POA: Diagnosis not present

## 2013-07-22 DIAGNOSIS — H3562 Retinal hemorrhage, left eye: Secondary | ICD-10-CM

## 2013-07-22 DIAGNOSIS — E785 Hyperlipidemia, unspecified: Secondary | ICD-10-CM | POA: Diagnosis not present

## 2013-07-22 DIAGNOSIS — R0609 Other forms of dyspnea: Secondary | ICD-10-CM

## 2013-07-22 DIAGNOSIS — I10 Essential (primary) hypertension: Secondary | ICD-10-CM

## 2013-07-22 DIAGNOSIS — R0989 Other specified symptoms and signs involving the circulatory and respiratory systems: Secondary | ICD-10-CM

## 2013-07-22 DIAGNOSIS — H3320 Serous retinal detachment, unspecified eye: Secondary | ICD-10-CM | POA: Insufficient documentation

## 2013-07-22 DIAGNOSIS — R0600 Dyspnea, unspecified: Secondary | ICD-10-CM

## 2013-07-22 MED ORDER — INSULIN ISOPHANE & REGULAR (HUMAN 70-30)100 UNIT/ML KWIKPEN: PEN_INJECTOR | SUBCUTANEOUS | Status: DC

## 2013-07-22 NOTE — Progress Notes (Signed)
Patient ID: Micheal [name], male   DOB: October 16, 1928, 80 y.o.   MRN: [id number]    Location:    [facility]  Place of Service:  OFFICE    Allergies  Allergen Reactions  . Atorvastatin     Muscular pain and weakness  . Metformin Hcl Nausea Only  . Simvastatin     Muscular pain and weakness  . Sulfa Antibiotics   . Voltaren [Diclofenac Sodium]     Chief Complaint  Patient presents with  . Medical Management of Chronic Issues    blood sugar, blood pressure, cholesterol. Vitrectomy 04/23/13 at [facility] for hemorrhage left eye    HPI:  Since last seen, he had a hemorrhage in the left eye. Had steroid injection in the eye and Eylea. Almost totally blind for 6-7 weeks. Starting to get some vision back. Dr. Loretta Plume at [facility] operated.  Other and unspecified hyperlipidemia - controlled  Type II or unspecified type diabetes mellitus without mention of complication, uncontrolled - poor control. He believes he is following appropriate diet. Has been very stressed by his eye problems. He has been hesitant to increase his insulin, but he has not had any hypoglycemia.  Unspecified essential hypertension - controlled  Dyspnea - continues with SOB on exertion    Medications: Patient's Medications  New Prescriptions   No medications on file  Previous Medications   ACETAMINOPHEN (TYLENOL) 500 MG TABLET    Take 500 mg by mouth every 6 (six) hours as needed for pain. Take one tablet as needed for pain.   B-D ULTRAFINE III SHORT PEN 31G X 8 MM MISC    USE AS DIRECTED   CHOLECALCIFEROL (VITAMIN D) 1000 UNITS TABLET    Take 1,000 Units by mouth daily. Take 2 capsules daily for vitamin D supplement   FAMOTIDINE (PEPCID) 20 MG TABLET    Take 20 mg by mouth as needed. Take one tablet as needed for reflux.   HUMULIN 70/30 KWIKPEN (70-30) 100 UNIT/ML PEN    INJECT 10 UNITS EACH MORNING TO HELP CONTROL DIABETES   INSULIN GLARGINE (LANTUS) 100 UNIT/ML INJECTION    Inject 20 units at bedtime for diabetes.     MULTIPLE VITAMIN (MULTIVITAMIN WITH MINERALS) TABS TABLET    Take 1 tablet by mouth daily.   NYSTATIN-TRIAMCINOLONE (MYCOLOG II) CREAM    Apply topically 4 (four) times daily. Apply daily to rash   TAMSULOSIN (FLOMAX) 0.4 MG CAPS CAPSULE    Take one capsule once day for prostate  Modified Medications   No medications on file  Discontinued Medications   INSULIN NPH-REGULAR HUMAN (NOVOLIN 70/30) (70-30) 100 UNIT/ML INJECTION    Inject 10 units each morning to help control diabetes   ONDANSETRON (ZOFRAN) 4 MG TABLET    Take 1 tablet (4 mg total) by mouth every 6 (six) hours.     Review of Systems  Constitutional: Positive for malaise/fatigue and fatigue. Negative for fever, chills, weight loss, diaphoresis, activity change and appetite change.  HENT: Positive for hearing loss.   Eyes: Positive for visual disturbance. Negative for blurred vision.       Severe visual problems related to macular degeneration, retinal hemorrhage,  Respiratory: Positive for shortness of breath (on exertion).   Cardiovascular: Negative.  Negative for chest pain, palpitations, leg swelling and PND.  Endocrine: Negative for polydipsia, polyphagia and polyuria.       Diabetic. Poor control.  Genitourinary:       Hx prostate cancer. Also may have BPH. Followed by  Dr. Risa Grill.  Musculoskeletal: Positive for arthralgias. Negative for back pain, gait problem, joint swelling, myalgias and neck pain.  Skin: Negative.  Negative for pallor.  Allergic/Immunologic: Negative.   Neurological: Positive for tremors and weakness. Negative for dizziness, seizures, speech difficulty and headaches.  Hematological: Negative.   Psychiatric/Behavioral: Negative for confusion. The patient is nervous/anxious.     Filed Vitals:   07/22/13 1258  BP: 126/84  Pulse: 96  Temp: 97.4 F (36.3 C)  TempSrc: Oral  Weight: 157 lb (71.215 kg)   Body mass index is 24.58 kg/(m^2).  Physical Exam  Constitutional: He is oriented to person,  place, and time. He appears well-developed and well-nourished.  HENT:  Head: Normocephalic and atraumatic.  Loss of hearing.  Eyes:  Severe visual losses due to macular degeneration and retinal hemorrhages.  Neck: Normal range of motion. Neck supple. No JVD present. No tracheal deviation present. No thyromegaly present.  Cardiovascular: Normal rate, regular rhythm, normal heart sounds and intact distal pulses.   Pulmonary/Chest: Effort normal and breath sounds normal.  Abdominal: Soft. Bowel sounds are normal. He exhibits no mass. There is no guarding.  Musculoskeletal: Normal range of motion. He exhibits no edema and no tenderness.  Lymphadenopathy:    He has no cervical adenopathy.  Neurological: He is alert and oriented to person, place, and time. No cranial nerve deficit. Coordination normal.  Skin: Skin is warm and dry.  Multiple SK.  Psychiatric: He has a normal mood and affect. His behavior is normal. Thought content normal.     Labs reviewed: Appointment on 07/16/2013  Component Date Value Ref Range Status  . Hemoglobin A1C 07/16/2013 8.6* 4.8 - 5.6 % Final   Comment:          Increased risk for diabetes: 5.7 - 6.4                                   Diabetes: >6.4                                   Glycemic control for adults with diabetes: <7.0  . Estimated average glucose 07/16/2013 200   Final  . Glucose 07/16/2013 167* 65 - 99 mg/dL Final  . BUN 07/16/2013 16  8 - 27 mg/dL Final  . Creatinine, Ser 07/16/2013 0.92  0.76 - 1.27 mg/dL Final  . GFR calc non Af Amer 07/16/2013 77  >59 mL/min/1.73 Final  . GFR calc Af Amer 07/16/2013 89  >59 mL/min/1.73 Final  . BUN/Creatinine Ratio 07/16/2013 17  10 - 22 Final  . Sodium 07/16/2013 140  134 - 144 mmol/L Final  . Potassium 07/16/2013 4.8  3.5 - 5.2 mmol/L Final  . Chloride 07/16/2013 105  97 - 108 mmol/L Final  . CO2 07/16/2013 23  18 - 29 mmol/L Final  . Calcium 07/16/2013 11.3* 8.6 - 10.2 mg/dL Final  . Cholesterol, Total  07/16/2013 252* 100 - 199 mg/dL Final  . Triglycerides 07/16/2013 270* 0 - 149 mg/dL Final  . HDL 07/16/2013 35* >39 mg/dL Final   Comment: According to ATP-III Guidelines, HDL-C >59 mg/dL is considered a                          negative risk factor for CHD.  Marland Kitchen VLDL Cholesterol Cal 07/16/2013 54* 5 -  40 mg/dL Final  . LDL Calculated 07/16/2013 163* 0 - 99 mg/dL Final  . Chol/HDL Ratio 07/16/2013 7.2* 0.0 - 5.0 ratio units Final   Comment:                                   T. Chol/HDL Ratio                                                                      Men  Women                                                        1/2 Avg.Risk  3.4    3.3                                                            Avg.Risk  5.0    4.4                                                         2X Avg.Risk  9.6    7.1                                                         3X Avg.Risk 23.4   11.0      Assessment/Plan  1. Other and unspecified hyperlipidemia - Lipid panel; Future  2. Retinal hemorrhage of left eye Continues with [facility]  3. Type II or unspecified type diabetes mellitus without mention of complication, uncontrolled Advised patient to be more aggressive than insulin use. I told him I would like to see the blood sugars less than 140 virtually all occasions. He has been letting them run up to over 200 mg percent. - Insulin Isophane & Regular Human (HUMULIN 70/30 KWIKPEN) (70-30) 100 UNIT/ML PEN; Inject 12-15 units in the morning and 24-26 units in the evening to control diabetes.  Dispense: 5 pen; Refill: 3 - Hemoglobin A1c; Future - Comprehensive metabolic panel; Future - Microalbumin, urine; Future  4. Unspecified essential hypertension Controlled - Comprehensive metabolic panel; Future  5. Dyspnea Continue to observe. Denies chest pain with this. - EKG 12-Lead; Future

## 2013-07-22 NOTE — Patient Instructions (Signed)
Increase insulin as needed to control glucose to 120 or less. Increase by 2 units morning and evening every 3rd day until your goal is reached.

## 2013-08-05 DIAGNOSIS — L820 Inflamed seborrheic keratosis: Secondary | ICD-10-CM | POA: Diagnosis not present

## 2013-08-05 DIAGNOSIS — D235 Other benign neoplasm of skin of trunk: Secondary | ICD-10-CM | POA: Diagnosis not present

## 2013-08-17 DIAGNOSIS — Z87898 Personal history of other specified conditions: Secondary | ICD-10-CM | POA: Diagnosis not present

## 2013-08-17 DIAGNOSIS — Z9889 Other specified postprocedural states: Secondary | ICD-10-CM | POA: Diagnosis not present

## 2013-08-17 DIAGNOSIS — H353290 Exudative age-related macular degeneration, unspecified eye, stage unspecified: Secondary | ICD-10-CM | POA: Diagnosis not present

## 2013-08-24 ENCOUNTER — Telehealth: Payer: Self-pay | Admitting: *Deleted

## 2013-08-24 NOTE — Telephone Encounter (Signed)
Received form in the mail for [employer]. Form needs to be filled out by Dr. Nyoka Cowden and mailed back to [employer]. Filled out what I could and left for Dr. Nyoka Cowden to complete [employer] and [place] [address] # [phone]

## 2013-08-31 ENCOUNTER — Other Ambulatory Visit: Payer: Self-pay | Admitting: *Deleted

## 2013-09-01 ENCOUNTER — Other Ambulatory Visit: Payer: Self-pay | Admitting: Internal Medicine

## 2013-09-18 DIAGNOSIS — H353290 Exudative age-related macular degeneration, unspecified eye, stage unspecified: Secondary | ICD-10-CM | POA: Diagnosis not present

## 2013-10-19 DIAGNOSIS — H353290 Exudative age-related macular degeneration, unspecified eye, stage unspecified: Secondary | ICD-10-CM | POA: Diagnosis not present

## 2013-10-22 ENCOUNTER — Other Ambulatory Visit: Payer: Medicare Other

## 2013-10-22 DIAGNOSIS — IMO0002 Reserved for concepts with insufficient information to code with codable children: Secondary | ICD-10-CM

## 2013-10-22 DIAGNOSIS — E1165 Type 2 diabetes mellitus with hyperglycemia: Secondary | ICD-10-CM

## 2013-10-22 DIAGNOSIS — I10 Essential (primary) hypertension: Secondary | ICD-10-CM | POA: Diagnosis not present

## 2013-10-22 DIAGNOSIS — E785 Hyperlipidemia, unspecified: Secondary | ICD-10-CM

## 2013-10-23 LAB — COMPREHENSIVE METABOLIC PANEL
ALT: 12 IU/L (ref 0–44)
AST: 17 IU/L (ref 0–40)
Albumin/Globulin Ratio: 2.4 (ref 1.1–2.5)
Albumin: 4.1 g/dL (ref 3.5–4.7)
Alkaline Phosphatase: 92 IU/L (ref 39–117)
BUN/Creatinine Ratio: 17 (ref 10–22)
BUN: 17 mg/dL (ref 8–27)
CO2: 21 mmol/L (ref 18–29)
Calcium: 10.2 mg/dL (ref 8.6–10.2)
Chloride: 107 mmol/L (ref 97–108)
Creatinine, Ser: 0.98 mg/dL (ref 0.76–1.27)
GFR calc Af Amer: 82 mL/min/{1.73_m2} (ref >59)
GFR calc non Af Amer: 71 mL/min/{1.73_m2} (ref >59)
Globulin, Total: 1.7 g/dL (ref 1.5–4.5)
Glucose: 173 mg/dL — ABNORMAL HIGH (ref 65–99)
Potassium: 4.4 mmol/L (ref 3.5–5.2)
Sodium: 141 mmol/L (ref 134–144)
Total Bilirubin: 0.3 mg/dL (ref 0.0–1.2)
Total Protein: 5.8 g/dL — ABNORMAL LOW (ref 6.0–8.5)

## 2013-10-23 LAB — HEMOGLOBIN A1C
Est. average glucose Bld gHb Est-mCnc: 169 mg/dL
Hgb A1c MFr Bld: 7.5 % — ABNORMAL HIGH (ref 4.8–5.6)

## 2013-10-23 LAB — LIPID PANEL
Chol/HDL Ratio: 6.2 ratio units — ABNORMAL HIGH (ref 0.0–5.0)
Cholesterol, Total: 229 mg/dL — ABNORMAL HIGH (ref 100–199)
HDL: 37 mg/dL — ABNORMAL LOW (ref >39)
LDL Calculated: 159 mg/dL — ABNORMAL HIGH (ref 0–99)
Triglycerides: 163 mg/dL — ABNORMAL HIGH (ref 0–149)
VLDL Cholesterol Cal: 33 mg/dL (ref 5–40)

## 2013-10-23 LAB — MICROALBUMIN, URINE: Microalbumin, Urine: 4.3 ug/mL (ref 0.0–17.0)

## 2013-10-28 ENCOUNTER — Ambulatory Visit (INDEPENDENT_AMBULATORY_CARE_PROVIDER_SITE_OTHER): Payer: Medicare Other | Admitting: Internal Medicine

## 2013-10-28 ENCOUNTER — Encounter: Payer: Self-pay | Admitting: Internal Medicine

## 2013-10-28 VITALS — BP 130/82 | HR 105 | Temp 97.9°F | Resp 20 | Ht 67.0 in | Wt 158.4 lb

## 2013-10-28 DIAGNOSIS — E785 Hyperlipidemia, unspecified: Secondary | ICD-10-CM | POA: Diagnosis not present

## 2013-10-28 DIAGNOSIS — H548 Legal blindness, as defined in USA: Secondary | ICD-10-CM

## 2013-10-28 DIAGNOSIS — N400 Enlarged prostate without lower urinary tract symptoms: Secondary | ICD-10-CM

## 2013-10-28 DIAGNOSIS — IMO0002 Reserved for concepts with insufficient information to code with codable children: Secondary | ICD-10-CM

## 2013-10-28 DIAGNOSIS — E1165 Type 2 diabetes mellitus with hyperglycemia: Secondary | ICD-10-CM

## 2013-10-28 DIAGNOSIS — C61 Malignant neoplasm of prostate: Secondary | ICD-10-CM | POA: Diagnosis not present

## 2013-10-28 DIAGNOSIS — I10 Essential (primary) hypertension: Secondary | ICD-10-CM | POA: Diagnosis not present

## 2013-10-28 DIAGNOSIS — Z23 Encounter for immunization: Secondary | ICD-10-CM

## 2013-10-28 DIAGNOSIS — K589 Irritable bowel syndrome without diarrhea: Secondary | ICD-10-CM

## 2013-10-28 NOTE — Progress Notes (Signed)
Patient ID: Micheal [name], male   DOB: [birth year], 80 y.o.   MRN: [id number]    Facility  [facility]    Place of Service:   OFFICE   Allergies  Allergen Reactions  . Atorvastatin     Muscular pain and weakness  . Metformin Hcl Nausea Only  . Simvastatin     Muscular pain and weakness  . Sulfa Antibiotics   . Voltaren [Diclofenac Sodium]     Chief Complaint  Patient presents with  . Medical Management of Chronic Issues    flu vaccine    HPI:  Legally blind: Having more problems with retinal hemorrhage and macular degeneration. His eye doctors at [facility] tell him that nothing more can be done.  Type 2 diabetes mellitus, uncontrolled: Improving control  Essential hypertension: Controlled  Hyperlipidemia: Continues to run a high LDL. He is intolerant to statins.  Irritable bowel syndrome: Continues with episodes of loose stools and some constipation.  BPH (benign prostatic hyperplasia): Continues with some hesitation on urination.    Medications: Patient's Medications  New Prescriptions   No medications on file  Previous Medications   ACETAMINOPHEN (TYLENOL) 500 MG TABLET    Take 500 mg by mouth every 6 (six) hours as needed for pain. Take one tablet as needed for pain.   B-D ULTRAFINE III SHORT PEN 31G X 8 MM MISC    USE AS DIRECTED   CHOLECALCIFEROL (VITAMIN D) 1000 UNITS TABLET    Take 1,000 Units by mouth daily. Take 2 capsules daily for vitamin D supplement   FAMOTIDINE (PEPCID) 20 MG TABLET    Take 20 mg by mouth as needed. Take one tablet as needed for reflux.   INSULIN ISOPHANE & REGULAR HUMAN (HUMULIN 70/30 KWIKPEN) (70-30) 100 UNIT/ML PEN    Inject 12-15 units in the morning and 24-26 units in the evening to control diabetes.   LANTUS SOLOSTAR 100 UNIT/ML SOLOSTAR PEN    INJECT 20 UNITS AT BEDTIME FOR DIABETES.   MULTIPLE VITAMIN (MULTIVITAMIN WITH MINERALS) TABS TABLET    Take 1 tablet by mouth daily.   NYSTATIN-TRIAMCINOLONE (MYCOLOG II) CREAM    Apply topically 4  (four) times daily. Apply daily to rash   TAMSULOSIN (FLOMAX) 0.4 MG CAPS CAPSULE    Take one capsule once day for prostate  Modified Medications   No medications on file  Discontinued Medications   No medications on file     Review of Systems  Constitutional: Positive for malaise/fatigue and fatigue. Negative for fever, chills, weight loss, diaphoresis, activity change and appetite change.  HENT: Positive for hearing loss.   Eyes: Positive for visual disturbance. Negative for blurred vision.       Severe visual problems related to macular degeneration, retinal hemorrhage,  Respiratory: Positive for shortness of breath (on exertion).   Cardiovascular: Negative.  Negative for chest pain, palpitations, leg swelling and PND.  Endocrine: Negative for polydipsia, polyphagia and polyuria.       Diabetic. Poor control.  Genitourinary:       Hx prostate cancer. Also may have BPH. Followed by Dr. Risa Grill.  Musculoskeletal: Positive for arthralgias. Negative for back pain, gait problem, joint swelling, myalgias and neck pain.  Skin: Negative.  Negative for pallor.  Allergic/Immunologic: Negative.   Neurological: Positive for tremors and weakness. Negative for dizziness, seizures, speech difficulty and headaches.  Hematological: Negative.   Psychiatric/Behavioral: Negative for confusion. The patient is nervous/anxious.     Filed Vitals:   10/28/13 1148  BP: 130/82  Pulse: 105  Temp: 97.9 F (36.6 C)  TempSrc: Oral  Resp: 20  Height: 5\' 7"  (1.702 m)  Weight: 158 lb 6.4 oz (71.85 kg)  SpO2: 95%   Body mass index is 24.8 kg/(m^2).  Physical Exam  Constitutional: He is oriented to person, place, and time. He appears well-developed and well-nourished.  HENT:  Head: Normocephalic and atraumatic.  Loss of hearing.  Eyes:  Severe visual losses due to macular degeneration and retinal hemorrhages.  Neck: Normal range of motion. Neck supple. No JVD present. No tracheal deviation present. No  thyromegaly present.  Cardiovascular: Normal rate, regular rhythm, normal heart sounds and intact distal pulses.   Pulmonary/Chest: Effort normal and breath sounds normal.  Abdominal: Soft. Bowel sounds are normal. He exhibits no mass. There is no guarding.  Musculoskeletal: Normal range of motion. He exhibits no edema and no tenderness.  Lymphadenopathy:    He has no cervical adenopathy.  Neurological: He is alert and oriented to person, place, and time. No cranial nerve deficit. Coordination normal.  Skin: Skin is warm and dry.  Multiple SK.  Psychiatric: He has a normal mood and affect. His behavior is normal. Thought content normal.     Labs reviewed: Appointment on 10/22/2013  Component Date Value Ref Range Status  . Hemoglobin A1C 10/22/2013 7.5* 4.8 - 5.6 % Final   Comment:          Increased risk for diabetes: 5.7 - 6.4                                   Diabetes: >6.4                                   Glycemic control for adults with diabetes: <7.0  . Estimated average glucose 10/22/2013 169   Final  . Glucose 10/22/2013 173* 65 - 99 mg/dL Final  . BUN 10/22/2013 17  8 - 27 mg/dL Final  . Creatinine, Ser 10/22/2013 0.98  0.76 - 1.27 mg/dL Final  . GFR calc non Af Amer 10/22/2013 71  >59 mL/min/1.73 Final  . GFR calc Af Amer 10/22/2013 82  >59 mL/min/1.73 Final  . BUN/Creatinine Ratio 10/22/2013 17  10 - 22 Final  . Sodium 10/22/2013 141  134 - 144 mmol/L Final  . Potassium 10/22/2013 4.4  3.5 - 5.2 mmol/L Final  . Chloride 10/22/2013 107  97 - 108 mmol/L Final  . CO2 10/22/2013 21  18 - 29 mmol/L Final  . Calcium 10/22/2013 10.2  8.6 - 10.2 mg/dL Final  . Total Protein 10/22/2013 5.8* 6.0 - 8.5 g/dL Final  . Albumin 10/22/2013 4.1  3.5 - 4.7 g/dL Final  . Globulin, Total 10/22/2013 1.7  1.5 - 4.5 g/dL Final  . Albumin/Globulin Ratio 10/22/2013 2.4  1.1 - 2.5 Final  . Total Bilirubin 10/22/2013 0.3  0.0 - 1.2 mg/dL Final  . Alkaline Phosphatase 10/22/2013 92  39 - 117  IU/L Final  . AST 10/22/2013 17  0 - 40 IU/L Final  . ALT 10/22/2013 12  0 - 44 IU/L Final  . Cholesterol, Total 10/22/2013 229* 100 - 199 mg/dL Final  . Triglycerides 10/22/2013 163* 0 - 149 mg/dL Final  . HDL 10/22/2013 37* >39 mg/dL Final   Comment: According to ATP-III Guidelines, HDL-C >59 mg/dL is considered a  negative risk factor for CHD.  Marland Kitchen VLDL Cholesterol Cal 10/22/2013 33  5 - 40 mg/dL Final  . LDL Calculated 10/22/2013 159* 0 - 99 mg/dL Final  . Chol/HDL Ratio 10/22/2013 6.2* 0.0 - 5.0 ratio units Final   Comment:                                   T. Chol/HDL Ratio                                                                      Men  Women                                                        1/2 Avg.Risk  3.4    3.3                                                            Avg.Risk  5.0    4.4                                                         2X Avg.Risk  9.6    7.1                                                         3X Avg.Risk 23.4   11.0  . Microalbum.,U,Random 10/22/2013 4.3  0.0 - 17.0 ug/mL Final   **Verified by repeat analysis**     Assessment/Plan 1. Legally blind continue to see his ophthalmologist periodically.  2. Type 2 diabetes mellitus, uncontrolled Continue dietary control as well as current medication - Hemoglobin A1c; Future - Basic metabolic panel; Future  3. Essential hypertension Controlled  4. Hyperlipidemia Continue to monitor - Lipid panel; Future  5. Irritable bowel syndrome No change in medications  6. BPH (benign prostatic hyperplasia) No change in medication

## 2013-10-29 NOTE — Telephone Encounter (Signed)
Patient was seen 10/6 and Dr. Nyoka Cowden cannot find [employer] Form. I called [employer] # [phone] and spoke with Tye [place] and she will resend form by fax

## 2013-11-03 DIAGNOSIS — C61 Malignant neoplasm of prostate: Secondary | ICD-10-CM | POA: Diagnosis not present

## 2013-11-04 ENCOUNTER — Encounter: Payer: Self-pay | Admitting: *Deleted

## 2013-11-04 NOTE — Telephone Encounter (Signed)
error 

## 2013-11-04 NOTE — Telephone Encounter (Signed)
Wife called and wanted to know the status of the forms. We have received forms just waiting for Dr. Nyoka Cowden to sign off.

## 2013-11-06 ENCOUNTER — Encounter (HOSPITAL_COMMUNITY): Payer: Self-pay | Admitting: Emergency Medicine

## 2013-11-06 ENCOUNTER — Emergency Department (HOSPITAL_COMMUNITY): Payer: Medicare Other

## 2013-11-06 ENCOUNTER — Emergency Department (HOSPITAL_COMMUNITY)
Admission: EM | Admit: 2013-11-06 | Discharge: 2013-11-06 | Disposition: A | Discharge status: Home / Self Care | Payer: Medicare Other | Attending: Emergency Medicine | Admitting: Emergency Medicine

## 2013-11-06 DIAGNOSIS — I10 Essential (primary) hypertension: Secondary | ICD-10-CM | POA: Insufficient documentation

## 2013-11-06 DIAGNOSIS — K219 Gastro-esophageal reflux disease without esophagitis: Secondary | ICD-10-CM | POA: Diagnosis not present

## 2013-11-06 DIAGNOSIS — M1990 Unspecified osteoarthritis, unspecified site: Secondary | ICD-10-CM | POA: Diagnosis not present

## 2013-11-06 DIAGNOSIS — Z86010 Personal history of colonic polyps: Secondary | ICD-10-CM | POA: Diagnosis not present

## 2013-11-06 DIAGNOSIS — Z794 Long term (current) use of insulin: Secondary | ICD-10-CM | POA: Insufficient documentation

## 2013-11-06 DIAGNOSIS — K5732 Diverticulitis of large intestine without perforation or abscess without bleeding: Secondary | ICD-10-CM | POA: Diagnosis not present

## 2013-11-06 DIAGNOSIS — Z8546 Personal history of malignant neoplasm of prostate: Secondary | ICD-10-CM | POA: Diagnosis not present

## 2013-11-06 DIAGNOSIS — E119 Type 2 diabetes mellitus without complications: Secondary | ICD-10-CM | POA: Insufficient documentation

## 2013-11-06 DIAGNOSIS — K644 Residual hemorrhoidal skin tags: Secondary | ICD-10-CM | POA: Diagnosis not present

## 2013-11-06 DIAGNOSIS — E559 Vitamin D deficiency, unspecified: Secondary | ICD-10-CM | POA: Insufficient documentation

## 2013-11-06 DIAGNOSIS — Z9049 Acquired absence of other specified parts of digestive tract: Secondary | ICD-10-CM | POA: Insufficient documentation

## 2013-11-06 DIAGNOSIS — Z9889 Other specified postprocedural states: Secondary | ICD-10-CM | POA: Diagnosis not present

## 2013-11-06 DIAGNOSIS — N281 Cyst of kidney, acquired: Secondary | ICD-10-CM | POA: Diagnosis not present

## 2013-11-06 DIAGNOSIS — Z8669 Personal history of other diseases of the nervous system and sense organs: Secondary | ICD-10-CM | POA: Diagnosis not present

## 2013-11-06 DIAGNOSIS — K5730 Diverticulosis of large intestine without perforation or abscess without bleeding: Secondary | ICD-10-CM | POA: Diagnosis not present

## 2013-11-06 DIAGNOSIS — R109 Unspecified abdominal pain: Secondary | ICD-10-CM | POA: Diagnosis present

## 2013-11-06 DIAGNOSIS — Z79899 Other long term (current) drug therapy: Secondary | ICD-10-CM | POA: Insufficient documentation

## 2013-11-06 DIAGNOSIS — N400 Enlarged prostate without lower urinary tract symptoms: Secondary | ICD-10-CM | POA: Insufficient documentation

## 2013-11-06 DIAGNOSIS — R1032 Left lower quadrant pain: Secondary | ICD-10-CM | POA: Diagnosis not present

## 2013-11-06 LAB — URINALYSIS, ROUTINE W REFLEX MICROSCOPIC
Bilirubin Urine: NEGATIVE
Glucose, UA: NEGATIVE mg/dL
Hgb urine dipstick: NEGATIVE
Ketones, ur: NEGATIVE mg/dL
Leukocytes, UA: NEGATIVE
Nitrite: NEGATIVE
Protein, ur: NEGATIVE mg/dL
Specific Gravity, Urine: 1.012 (ref 1.005–1.030)
Urobilinogen, UA: 0.2 mg/dL (ref 0.0–1.0)
pH: 6 (ref 5.0–8.0)

## 2013-11-06 LAB — CBG MONITORING, ED: Glucose-Capillary: 110 mg/dL — ABNORMAL HIGH (ref 70–99)

## 2013-11-06 LAB — COMPREHENSIVE METABOLIC PANEL
ALT: 14 U/L (ref 0–53)
AST: 17 U/L (ref 0–37)
Albumin: 3.8 g/dL (ref 3.5–5.2)
Alkaline Phosphatase: 105 U/L (ref 39–117)
Anion gap: 14 (ref 5–15)
BUN: 13 mg/dL (ref 6–23)
CO2: 22 mEq/L (ref 19–32)
Calcium: 10.2 mg/dL (ref 8.4–10.5)
Chloride: 104 mEq/L (ref 96–112)
Creatinine, Ser: 0.85 mg/dL (ref 0.50–1.35)
GFR calc Af Amer: >90 mL/min (ref >90)
GFR calc non Af Amer: 78 mL/min — ABNORMAL LOW (ref >90)
Glucose, Bld: 116 mg/dL — ABNORMAL HIGH (ref 70–99)
Potassium: 4.1 mEq/L (ref 3.7–5.3)
Sodium: 140 mEq/L (ref 137–147)
Total Bilirubin: 0.6 mg/dL (ref 0.3–1.2)
Total Protein: 7.1 g/dL (ref 6.0–8.3)

## 2013-11-06 LAB — LIPASE, BLOOD: Lipase: 28 U/L (ref 11–59)

## 2013-11-06 LAB — CBC WITH DIFFERENTIAL/PLATELET
Basophils Absolute: 0 10*3/uL (ref 0.0–0.1)
Basophils Relative: 0 % (ref 0–1)
Eosinophils Absolute: 0.1 10*3/uL (ref 0.0–0.7)
Eosinophils Relative: 1 % (ref 0–5)
HCT: 37.3 % — ABNORMAL LOW (ref 39.0–52.0)
Hemoglobin: 12.9 g/dL — ABNORMAL LOW (ref 13.0–17.0)
Lymphocytes Relative: 9 % — ABNORMAL LOW (ref 12–46)
Lymphs Abs: 1 10*3/uL (ref 0.7–4.0)
MCH: 30.4 pg (ref 26.0–34.0)
MCHC: 34.6 g/dL (ref 30.0–36.0)
MCV: 87.8 fL (ref 78.0–100.0)
Monocytes Absolute: 1.1 10*3/uL — ABNORMAL HIGH (ref 0.1–1.0)
Monocytes Relative: 10 % (ref 3–12)
Neutro Abs: 8.9 10*3/uL — ABNORMAL HIGH (ref 1.7–7.7)
Neutrophils Relative %: 80 % — ABNORMAL HIGH (ref 43–77)
Platelets: 215 10*3/uL (ref 150–400)
RBC: 4.25 MIL/uL (ref 4.22–5.81)
RDW: 12.1 % (ref 11.5–15.5)
WBC: 11.2 10*3/uL — ABNORMAL HIGH (ref 4.0–10.5)

## 2013-11-06 LAB — POC OCCULT BLOOD, ED: Fecal Occult Bld: NEGATIVE

## 2013-11-06 MED ORDER — CIPROFLOXACIN HCL 500 MG PO TABS
500.0000 mg | ORAL_TABLET | Freq: Once | ORAL | Status: AC
  Administered 2013-11-06: 500 mg via ORAL
  Filled 2013-11-06: qty 1

## 2013-11-06 MED ORDER — IOHEXOL 300 MG/ML  SOLN
50.0000 mL | Freq: Once | INTRAMUSCULAR | Status: DC | PRN
Start: 2013-11-06 — End: 2013-11-06

## 2013-11-06 MED ORDER — METRONIDAZOLE 500 MG PO TABS: 500.0000 mg | ORAL_TABLET | Freq: Two times a day (BID) | ORAL | Status: DC

## 2013-11-06 MED ORDER — SODIUM CHLORIDE 0.9 % IV SOLN
INTRAVENOUS | Status: DC
  Administered 2013-11-06: 18:00:00 via INTRAVENOUS

## 2013-11-06 MED ORDER — IOHEXOL 300 MG/ML  SOLN
100.0000 mL | Freq: Once | INTRAMUSCULAR | Status: AC | PRN
  Administered 2013-11-06: 100 mL via INTRAVENOUS

## 2013-11-06 MED ORDER — CIPROFLOXACIN HCL 500 MG PO TABS: 500.0000 mg | ORAL_TABLET | Freq: Two times a day (BID) | ORAL | Status: DC

## 2013-11-06 MED ORDER — METRONIDAZOLE 500 MG PO TABS
500.0000 mg | ORAL_TABLET | Freq: Once | ORAL | Status: AC
Start: 2013-11-06 — End: 2013-11-06
  Administered 2013-11-06: 500 mg via ORAL
  Filled 2013-11-06: qty 1

## 2013-11-06 NOTE — ED Notes (Addendum)
Pt c/o increasing LLQ abdominal pain radiating into L groin x 1 week.  Pain score 8/10 with activity and palpation.  Denies n/v/d.  Hx of bowel obstructions, IBS and diverticulitis.

## 2013-11-06 NOTE — Discharge Instructions (Signed)
Drink plenty of fluids, and stay on a low fiber diet, for now. Start the antibiotic prescriptions in the morning. Use Tylenol, for pain, or fever.    Diverticulosis Diverticulosis is the condition that develops when small pouches (diverticula) form in the wall of your colon. Your colon, or large intestine, is where water is absorbed and stool is formed. The pouches form when the inside layer of your colon pushes through weak spots in the outer layers of your colon. CAUSES  No one knows exactly what causes diverticulosis. RISK FACTORS  Being older than 60. Your risk for this condition increases with age. Diverticulosis is rare in people younger than 40 years. By age 32, almost everyone has it.  Eating a low-fiber diet.  Being frequently constipated.  Being overweight.  Not getting enough exercise.  Smoking.  Taking over-the-counter pain medicines, like aspirin and ibuprofen. SYMPTOMS  Most people with diverticulosis do not have symptoms. DIAGNOSIS  Because diverticulosis often has no symptoms, health care providers often discover the condition during an exam for other colon problems. In many cases, a health care provider will diagnose diverticulosis while using a flexible scope to examine the colon (colonoscopy). TREATMENT  If you have never developed an infection related to diverticulosis, you may not need treatment. If you have had an infection before, treatment may include:  Eating more fruits, vegetables, and grains.  Taking a fiber supplement.  Taking a live bacteria supplement (probiotic).  Taking medicine to relax your colon. HOME CARE INSTRUCTIONS   Drink at least 6-8 glasses of water each day to prevent constipation.  Try not to strain when you have a bowel movement.  Keep all follow-up appointments. If you have had an infection before:  Increase the fiber in your diet as directed by your health care provider or dietitian.  Take a dietary fiber supplement if  your health care provider approves.  Only take medicines as directed by your health care provider. SEEK MEDICAL CARE IF:   You have abdominal pain.  You have bloating.  You have cramps.  You have not gone to the bathroom in 3 days. SEEK IMMEDIATE MEDICAL CARE IF:   Your pain gets worse.  Yourbloating becomes very bad.  You have a fever or chills, and your symptoms suddenly get worse.  You begin vomiting.  You have bowel movements that are bloody or black. MAKE SURE YOU:  Understand these instructions.  Will watch your condition.  Will get help right away if you are not doing well or get worse. Document Released: 10/06/2003 Document Revised: 01/13/2013 Document Reviewed: 12/03/2012 [facility] Patient Information 2015 [place], [place]. This information is not intended to replace advice given to you by your health care provider. Make sure you discuss any questions you have with your health care provider.  Low-Fiber Diet Fiber is found in fruits, vegetables, and whole grains. A low-fiber diet restricts fibrous foods that are not digested in the small intestine. A diet containing about 10-15 grams of fiber per day is considered low fiber. Low-fiber diets may be used to:  Promote healing and rest the bowel during intestinal flare-ups.  Prevent blockage of a partially obstructed or narrowed gastrointestinal tract.  Reduce fecal weight and volume.  Slow the movement of feces. You may be on a low-fiber diet as a transitional diet following surgery, after an injury (trauma), or because of a short (acute) or lifelong (chronic) illness. Your health care provider will determine the length of time you need to stay on this  diet.  WHAT DO I NEED TO KNOW ABOUT A LOW-FIBER DIET? Always check the fiber content on the packaging's Nutrition Facts label, especially on foods from the grains list. Ask your dietitian if you have questions about specific foods that are related to your condition,  especially if the food is not listed below. In general, a low-fiber food will have less than 2 g of fiber. WHAT FOODS CAN I EAT? Grains All breads and crackers made with white flour. Sweet rolls, doughnuts, waffles, pancakes, [place] toast, bagels. Pretzels, Melba toast, zwieback. Well-cooked cereals, such as cornmeal, farina, or cream cereals. Dry cereals that do not contain whole grains, fruit, or nuts, such as refined corn, wheat, rice, and oat cereals. Potatoes prepared any way without skins, plain pastas and noodles, refined white rice. Use white flour for baking and making sauces. Use allowed list of grains for casseroles, dumplings, and puddings.  Vegetables Strained tomato and vegetable juices. Fresh lettuce, cucumber, spinach. Well-cooked (no skin or pulp) or canned vegetables, such as asparagus, bean sprouts, beets, carrots, green beans, mushrooms, potatoes, pumpkin, spinach, yellow squash, tomato sauce/puree, turnips, yams, and zucchini. Keep servings limited to  cup.  Fruits All fruit juices except prune juice. Cooked or canned fruits without skin and seeds, such as applesauce, apricots, cherries, fruit cocktail, grapefruit, grapes, mandarin oranges, melons, peaches, pears, pineapple, and plums. Fresh fruits without skin, such as apricots, avocados, bananas, melons, pineapple, nectarines, and peaches. Keep servings limited to  cup or 1 piece.  Meat and Other Protein Sources Ground or well-cooked tender beef, ham, veal, lamb, pork, or poultry. Eggs, plain cheese. Fish, oysters, shrimp, lobster, and other seafood. Liver, organ meats. Smooth nut butters. Dairy All milk products and alternative dairy substitutes, such as soy, rice, almond, and coconut, not containing added whole nuts, seeds, or added fruit. Beverages Decaf coffee, fruit, and vegetable juices or smoothies (small amounts, with no pulp or skins, and with fruits from allowed list), sports drinks, herbal tea. Condiments Ketchup,  mustard, vinegar, cream sauce, cheese sauce, cocoa powder. Spices in moderation, such as allspice, basil, bay leaves, celery powder or leaves, cinnamon, cumin powder, curry powder, ginger, mace, marjoram, onion or garlic powder, oregano, paprika, parsley flakes, ground pepper, rosemary, sage, savory, tarragon, thyme, and turmeric. Sweets and Desserts Plain cakes and cookies, pie made with allowed fruit, pudding, custard, cream pie. Gelatin, fruit, ice, sherbet, frozen ice pops. Ice cream, ice milk without nuts. Plain hard candy, honey, jelly, molasses, syrup, sugar, chocolate syrup, gumdrops, marshmallows. Limit overall sugar intake.  Fats and Oil Margarine, butter, cream, mayonnaise, salad oils, plain salad dressings made from allowed foods. Choose healthy fats such as olive oil, canola oil, and omega-3 fatty acids (such as found in salmon or tuna) when possible.  Other Bouillon, broth, or cream soups made from allowed foods. Any strained soup. Casseroles or mixed dishes made with allowed foods. The items listed above may not be a complete list of recommended foods or beverages. Contact your dietitian for more options.  WHAT FOODS ARE NOT RECOMMENDED? Grains All whole wheat and whole grain breads and crackers. Multigrains, rye, bran seeds, nuts, or coconut. Cereals containing whole grains, multigrains, bran, coconut, nuts, raisins. Cooked or dry oatmeal, steel-cut oats. Coarse wheat cereals, granola. Cereals advertised as high fiber. Potato skins. Whole grain pasta, wild or brown rice. Popcorn. Coconut flour. Bran, buckwheat, corn bread, multigrains, rye, wheat germ.  Vegetables Fresh, cooked or canned vegetables, such as artichokes, asparagus, beet greens, broccoli, Brussels sprouts, cabbage, celery,  cauliflower, corn, eggplant, kale, legumes or beans, okra, peas, and tomatoes. Avoid large servings of any vegetables, especially raw vegetables.  Fruits Fresh fruits, such as apples with or without  skin, berries, cherries, figs, grapes, grapefruit, guavas, kiwis, mangoes, oranges, papayas, pears, persimmons, pineapple, and pomegranate. Prune juice and juices with pulp, stewed or dried prunes. Dried fruits, dates, raisins. Fruit seeds or skins. Avoid large servings of all fresh fruits. Meats and Other Protein Sources Tough, fibrous meats with gristle. Chunky nut butter. Cheese made with seeds, nuts, or other foods not recommended. Nuts, seeds, legumes (beans, including baked beans), dried peas, beans, lentils.  Dairy Yogurt or cheese that contains nuts, seeds, or added fruit.  Beverages Fruit juices with high pulp, prune juice. Caffeinated coffee and teas.  Condiments Coconut, maple syrup, pickles, olives. Sweets and Desserts Desserts, cookies, or candies that contain nuts or coconut, chunky peanut butter, dried fruits. Jams, preserves with seeds, marmalade. Large amounts of sugar and sweets. Any other dessert made with fruits from the not recommended list.  Other Soups made from vegetables that are not recommended or that contain other foods not recommended.  The items listed above may not be a complete list of foods and beverages to avoid. Contact your dietitian for more information. Document Released: 06/30/2001 Document Revised: 01/13/2013 Document Reviewed: 12/01/2012 [facility] Patient Information 2015 [place], [place]. This information is not intended to replace advice given to you by your health care provider. Make sure you discuss any questions you have with your health care provider.

## 2013-11-06 NOTE — ED Provider Notes (Signed)
CSN: [id number]     Arrival date & time 11/06/13  1431 History   First MD Initiated Contact with Patient 11/06/13 1539     Chief Complaint  Patient presents with  . Abdominal Pain     (Consider location/radiation/quality/duration/timing/severity/associated sxs/prior Treatment) HPI  Micheal [name] is a 80 y.o. male Here for evaluation of abdominal discomfort, present for 2 weeks, and worsening. The pain occasionally radiates to his left testicle. He has intermittent hard, dry stools, and sometimes passes liquid stool. He has had chills without documented fever. He denies chest pain, shortness of breath, cough, back pain, or dizziness. He feels weak when he walks. He denies leg pain. He has had similar pain, in the past, when he had diverticulitis. He has never had a kidney stone. He is taking his usual medications. There are no other known modifying factors.   Past Medical History  Diagnosis Date  . Malignant neoplasm of prostate   . Type II or unspecified type diabetes mellitus without mention of complication, not stated as uncontrolled   . Type II or unspecified type diabetes mellitus without mention of complication, uncontrolled   . Vitamin D deficiency   . Other and unspecified hyperlipidemia   . Restless legs syndrome (RLS)   . Carpal tunnel syndrome   . Retinal detachment with retinal defect, unspecified   . Macular degeneration (senile) of retina, unspecified   . Unspecified essential hypertension   . Right bundle branch block     Incomplete  . Paroxysmal supraventricular tachycardia   . Internal hemorrhoids without mention of complication   . Orthostatic hypotension   . Allergic rhinitis, cause unspecified   . Esophageal reflux   . Diverticulosis of colon (without mention of hemorrhage)   . Irritable bowel syndrome   . Other specified disorder of rectum and anus     Radiation Proctitus  . Neurogenic bladder, NOS   . Hypertrophy of prostate with urinary obstruction and  other lower urinary tract symptoms (LUTS)   . Spermatocele     Right  . Osteoarthrosis, unspecified whether generalized or localized, unspecified site   . Cervical spondylosis without myelopathy   . Cervicalgia   . Cervical spondylosis without myelopathy   . Trigger finger (acquired)   . Insomnia, unspecified   . Other malaise and fatigue   . Nonspecific (abnormal) findings on radiological and other examination of gastrointestinal tract    Past Surgical History  Procedure Laterality Date  . Appendectomy  1951  . Polypectomy  1955    Vocal cord  . Angiolipoma  1980    Right arm  . Knee surgery  1979  . Dupuytren contracture release Bilateral 1989  . Transurethral resection of prostate  08/1989  . Cholecystectomy  01/1994  . Eye surgery  2006    Left  . Eye surgery  2006    Retina reattachment, right  . Eye surgery  02/1994    Cataract surgery, right  . Eye surgery  01/1995    Cataract surgery, left  . Tendon release  01/1997    Fourth finger, left  . Retinal detachment surgery Right 2005  . Colonoscopy w/ polypectomy  2005    Removed 2 (two) polyps  . Colonoscopy  04/11/2010  . Vitrectomy Left 04/23/2013    [facility]   Family History  Problem Relation Age of Onset  . Depression Mother   . Diabetes Mother   . Mental illness Mother     OCD  . Hypertension Sister   .  Diabetes Daughter   . Hyperlipidemia Daughter    History  Substance Use Topics  . Smoking status: Never Smoker   . Smokeless tobacco: Not on file  . Alcohol Use: No    Review of Systems  All other systems reviewed and are negative.     Allergies  Aspirin; Atorvastatin; Metformin hcl; Prednisone; Simvastatin; Sulfa antibiotics; and Voltaren  Home Medications   Prior to Admission medications   Medication Sig Start Date End Date Taking? Authorizing Provider  acetaminophen (TYLENOL) 500 MG tablet Take 500 mg by mouth every 6 (six) hours as needed for pain. Take one tablet as needed for  pain.   Yes Historical Provider, MD  cholecalciferol (VITAMIN D) 1000 UNITS tablet Take 2,000 Units by mouth daily. Take 2 capsules daily for vitamin D supplement   Yes Historical Provider, MD  famotidine (PEPCID) 20 MG tablet Take 20 mg by mouth as needed. Take one tablet as needed for reflux.   Yes Historical Provider, MD  insulin glargine (LANTUS) 100 unit/mL SOPN Inject 20-28 Units into the skin at bedtime. Depends on sugar reading   Yes Historical Provider, MD  Insulin Isophane & Regular Human (HUMULIN 70/30 KWIKPEN) (70-30) 100 UNIT/ML PEN Inject 12-15 units in the morning and 24-26 units in the evening to control diabetes. 07/22/13  Yes Estill Dooms, MD  Multiple Vitamin (MULTIVITAMIN WITH MINERALS) TABS tablet Take 1 tablet by mouth daily.   Yes Historical Provider, MD  tamsulosin (FLOMAX) 0.4 MG CAPS capsule Take one capsule once day for prostate 03/30/13  Yes Tiffany L Reed, DO  B-D ULTRAFINE III SHORT PEN 31G X 8 MM MISC USE AS DIRECTED 07/15/13   Blanchie Serve, MD  ciprofloxacin (CIPRO) 500 MG tablet Take 1 tablet (500 mg total) by mouth 2 (two) times daily. 11/06/13   Richarda Blade, MD  metroNIDAZOLE (FLAGYL) 500 MG tablet Take 1 tablet (500 mg total) by mouth 2 (two) times daily. One po bid x 7 days 11/06/13   Richarda Blade, MD   BP 122/82  Pulse 94  Temp(Src) 99.7 F (37.6 C) (Oral)  Resp 16  SpO2 95% Physical Exam  Nursing note and vitals reviewed. Constitutional: He is oriented to person, place, and time. He appears well-developed and well-nourished.  HENT:  Head: Normocephalic and atraumatic.  Right Ear: External ear normal.  Left Ear: External ear normal.  Eyes: Conjunctivae and EOM are normal. Pupils are equal, round, and reactive to light.  Neck: Normal range of motion and phonation normal. Neck supple.  Cardiovascular: Normal rate, regular rhythm and normal heart sounds.   Pulmonary/Chest: Effort normal and breath sounds normal. No respiratory distress. He exhibits  no bony tenderness.  Abdominal: Soft. He exhibits no mass. There is tenderness (periumbilical, mild). There is no rebound and no guarding.  Genitourinary:  External skin tags present, nonbleeding. Brown stool in rectal, no impaction.  Musculoskeletal: Normal range of motion. He exhibits no edema.  Neurological: He is alert and oriented to person, place, and time. No cranial nerve deficit or sensory deficit. He exhibits normal muscle tone. Coordination normal.  Skin: Skin is warm, dry and intact.  Psychiatric: He has a normal mood and affect. His behavior is normal. Judgment and thought content normal.    ED Course  Procedures (including critical care time) Medications  0.9 %  sodium chloride infusion ( Intravenous Stopped 11/06/13 2047)  iohexol (OMNIPAQUE) 300 MG/ML solution 50 mL (not administered)  iohexol (OMNIPAQUE) 300 MG/ML solution 100 mL (100  mLs Intravenous Contrast Given 11/06/13 1906)  ciprofloxacin (CIPRO) tablet 500 mg (500 mg Oral Given 11/06/13 2023)  metroNIDAZOLE (FLAGYL) tablet 500 mg (500 mg Oral Given 11/06/13 2023)    Patient Vitals for the past 24 hrs:  BP Temp Temp src Pulse Resp SpO2  11/06/13 2046 122/82 mmHg - - 94 16 95 %  11/06/13 1933 132/52 mmHg 99.7 F (37.6 C) Oral 91 18 95 %  11/06/13 1820 156/80 mmHg 98.7 F (37.1 C) - 92 18 99 %  11/06/13 1459 142/73 mmHg 98.5 F (36.9 C) Oral 106 16 98 %     Labs Review Labs Reviewed  CBC WITH DIFFERENTIAL - Abnormal; Notable for the following:    WBC 11.2 (*)    Hemoglobin 12.9 (*)    HCT 37.3 (*)    Neutrophils Relative % 80 (*)    Neutro Abs 8.9 (*)    Lymphocytes Relative 9 (*)    Monocytes Absolute 1.1 (*)    All other components within normal limits  COMPREHENSIVE METABOLIC PANEL - Abnormal; Notable for the following:    Glucose, Bld 116 (*)    GFR calc non Af Amer 78 (*)    All other components within normal limits  CBG MONITORING, ED - Abnormal; Notable for the following:     Glucose-Capillary 110 (*)    All other components within normal limits  URINE CULTURE  URINALYSIS, ROUTINE W REFLEX MICROSCOPIC  LIPASE, BLOOD  OCCULT BLOOD X 1 CARD TO LAB, STOOL  POC OCCULT BLOOD, ED    Imaging Review Ct Abdomen Pelvis W Contrast  11/06/2013   CLINICAL DATA:  Left lower quadrant pain for 1 week becoming more acute today. Previous appendectomy and cholecystectomy as well as prostatectomy for prostate cancer.  EXAM: CT ABDOMEN AND PELVIS WITH CONTRAST  TECHNIQUE: Multidetector CT imaging of the abdomen and pelvis was performed using the standard protocol following bolus administration of intravenous contrast.  CONTRAST:  160mL OMNIPAQUE IOHEXOL 300 MG/ML  SOLN  COMPARISON:  03/07/2013 and 12/29/2010  FINDINGS: Lung bases are within normal.  Abdominal images demonstrate evidence of a prior cholecystectomy. The liver, spleen, pancreas and adrenal glands are within normal. Kidneys are normal in size without hydronephrosis or nephrolithiasis. There is a 2.8 cm simple cyst over the upper pole of the left kidney unchanged. Surgical absence of the appendix. There is mild calcified plaque over the abdominal aorta and iliac arteries.  There is diverticulosis of the colon as there is inflammatory change in the pericolonic fat and minimal free fluid adjacent a short segment of sigmoid colon in the left lower quadrant compatible acute diverticulitis. There is no evidence of perforation or abscess.  Pelvic images demonstrate surgical absence of the prostate. Bladder is unremarkable. There is no free fluid or adenopathy. There are mild degenerative changes of the spine and hips.  IMPRESSION: Diverticulosis of the colon with evidence of acute diverticulitis involving a short segment of sigmoid colon in the left lower quadrant. No evidence of perforation or abscess.  2.8 cm left renal cyst.   Electronically Signed   By: Marin Olp M.D.   On: 11/06/2013 19:38     EKG Interpretation None       MDM   Final diagnoses:  Diverticulitis of large intestine without perforation or abscess without bleeding   Uncomplicated diverticulitis, subacute, with tolerance of oral nutrition, and medication. Doubt serious bacterial infection, perforated colon, or metabolic instability.  Nursing Notes Reviewed/ Care Coordinated Applicable Imaging Reviewed Interpretation of  Laboratory Data incorporated into ED treatment  The patient appears reasonably screened and/or stabilized for discharge and I doubt any other medical condition or other [facility] requiring further screening, evaluation, or treatment in the ED at this time prior to discharge.  Plan: Home Medications- Flagyl, Cipro; Home Treatments- rest, gradually advance diet drink plenty of fluids; return here if the recommended treatment, does not improve the symptoms; Recommended follow up- PCP check up one week     Richarda Blade, MD 11/06/13 2254

## 2013-11-07 LAB — URINE CULTURE: Colony Count: 4000

## 2013-11-23 ENCOUNTER — Ambulatory Visit (INDEPENDENT_AMBULATORY_CARE_PROVIDER_SITE_OTHER): Payer: Medicare Other | Admitting: Pharmacotherapy

## 2013-11-23 ENCOUNTER — Ambulatory Visit (INDEPENDENT_AMBULATORY_CARE_PROVIDER_SITE_OTHER): Payer: Medicare Other | Admitting: *Deleted

## 2013-11-23 ENCOUNTER — Encounter: Payer: Self-pay | Admitting: Pharmacotherapy

## 2013-11-23 VITALS — BP 126/84 | HR 90 | Temp 97.6°F | Resp 10 | Wt 154.4 lb

## 2013-11-23 DIAGNOSIS — Z23 Encounter for immunization: Secondary | ICD-10-CM

## 2013-11-23 DIAGNOSIS — I10 Essential (primary) hypertension: Secondary | ICD-10-CM | POA: Diagnosis not present

## 2013-11-23 DIAGNOSIS — H548 Legal blindness, as defined in USA: Secondary | ICD-10-CM

## 2013-11-23 DIAGNOSIS — E1165 Type 2 diabetes mellitus with hyperglycemia: Secondary | ICD-10-CM | POA: Diagnosis not present

## 2013-11-23 DIAGNOSIS — IMO0002 Reserved for concepts with insufficient information to code with codable children: Secondary | ICD-10-CM

## 2013-11-23 NOTE — Progress Notes (Signed)
  Subjective:    Micheal [name] is a 80 y.o.white male who presents for follow-up of Type 2 diabetes mellitus.   He is interested in trying inhaled insulin. He is legally blind. He hit the "donut hole" end of August.  He is more concerned about the price of insulin.  His BG this morning was 105 mg/dl this morning.  His most recent A1C is 7.5%  He is trying to make healthy lifestyle choices. Denies significant hypoglycemia. Denies problems with feet.  Recent episode of diverticulitis.  Walks 1-2 miles every day for exercise.  He is taking Lantus 28 units daily He is taking Humalog 10-14 units with meals.  Review of Systems A comprehensive review of systems was negative except for: Eyes: positive for contacts/glasses and legally blind Gastrointestinal: positive for diarrhea    Objective:    BP 126/84 mmHg  Pulse 90  Temp(Src) 97.6 F (36.4 C) (Oral)  Resp 10  Wt 154 lb 6.4 oz (70.035 kg)  SpO2 98%  General:  alert, cooperative and no distress  Oropharynx: normal findings: lips normal without lesions and gums healthy   Eyes:  negative findings: lids and lashes normal and conjunctivae and sclerae normal   Ears:  external ears normal        Lung: clear to auscultation bilaterally  Heart:  regular rate and rhythm     Extremities: no edema  Skin: warm and dry, no hyperpigmentation, vitiligo, or suspicious lesions     Neuro: mental status, speech normal, alert and oriented x3 and gait and station normal   Lab Review GLUCOSE (mg/dL)  Date Value  10/22/2013 173*  07/16/2013 167*  02/26/2013 92   GLUCOSE, BLD (mg/dL)  Date Value  11/06/2013 116*  03/07/2013 270*  02/03/2010 71   CO2  Date Value  11/06/2013 22 mEq/L  10/22/2013 21 mmol/L  07/16/2013 23 mmol/L   BUN (mg/dL)  Date Value  11/06/2013 13  10/22/2013 17  07/16/2013 16  03/07/2013 23  02/26/2013 13  02/03/2010 13   CREATININE, SER (mg/dL)  Date Value  11/06/2013 0.85  10/22/2013 0.98   07/16/2013 0.92       Assessment:    Diabetes Mellitus type II, under good control.    Plan:    1.  Rx changes: none 2.  Will continue Lantus and Humalog for now. 3.  Discussed risk / benefit of inhaled insulin (Afrezza).  Explained that this would only take the place of Humalog, not both of his insulin needs.  His insurance prefers Humalog as a rapid acting insulin.  He does not have difficulty seeing his insulin pen.  He would like to stay with Lantus / Humalog. 4.  Continue Lantus 26 units daily.  Provided a Toujeo sample to help him get through "donut hole". 5.  Continue Humalog 10-14 units with meals.  Provided Novolog sample to help get through the "donut hole". 6.  Counseled on nutrition goals.  Provided written materials to supplement education.

## 2013-11-30 DIAGNOSIS — H3532 Exudative age-related macular degeneration: Secondary | ICD-10-CM | POA: Diagnosis not present

## 2014-01-19 ENCOUNTER — Other Ambulatory Visit: Payer: Self-pay | Admitting: Internal Medicine

## 2014-01-29 DIAGNOSIS — H3532 Exudative age-related macular degeneration: Secondary | ICD-10-CM | POA: Diagnosis not present

## 2014-02-18 ENCOUNTER — Other Ambulatory Visit: Payer: Medicare Other

## 2014-02-18 DIAGNOSIS — E785 Hyperlipidemia, unspecified: Secondary | ICD-10-CM | POA: Diagnosis not present

## 2014-02-18 DIAGNOSIS — E1165 Type 2 diabetes mellitus with hyperglycemia: Secondary | ICD-10-CM | POA: Diagnosis not present

## 2014-02-18 DIAGNOSIS — IMO0002 Reserved for concepts with insufficient information to code with codable children: Secondary | ICD-10-CM

## 2014-02-19 LAB — BASIC METABOLIC PANEL
BUN/Creatinine Ratio: 13 (ref 10–22)
BUN: 12 mg/dL (ref 8–27)
CO2: 23 mmol/L (ref 18–29)
Calcium: 10.6 mg/dL — ABNORMAL HIGH (ref 8.6–10.2)
Chloride: 108 mmol/L (ref 97–108)
Creatinine, Ser: 0.9 mg/dL (ref 0.76–1.27)
GFR calc Af Amer: 90 mL/min/{1.73_m2} (ref >59)
GFR calc non Af Amer: 78 mL/min/{1.73_m2} (ref >59)
Glucose: 83 mg/dL (ref 65–99)
Potassium: 4.3 mmol/L (ref 3.5–5.2)
Sodium: 145 mmol/L — ABNORMAL HIGH (ref 134–144)

## 2014-02-19 LAB — LIPID PANEL
Chol/HDL Ratio: 5.8 ratio units — ABNORMAL HIGH (ref 0.0–5.0)
Cholesterol, Total: 238 mg/dL — ABNORMAL HIGH (ref 100–199)
HDL: 41 mg/dL (ref >39)
LDL Calculated: 163 mg/dL — ABNORMAL HIGH (ref 0–99)
Triglycerides: 168 mg/dL — ABNORMAL HIGH (ref 0–149)
VLDL Cholesterol Cal: 34 mg/dL (ref 5–40)

## 2014-02-19 LAB — HEMOGLOBIN A1C
Est. average glucose Bld gHb Est-mCnc: 186 mg/dL
Hgb A1c MFr Bld: 8.1 % — ABNORMAL HIGH (ref 4.8–5.6)

## 2014-02-24 ENCOUNTER — Encounter: Payer: Self-pay | Admitting: Internal Medicine

## 2014-02-24 ENCOUNTER — Ambulatory Visit (INDEPENDENT_AMBULATORY_CARE_PROVIDER_SITE_OTHER): Payer: Medicare Other | Admitting: Internal Medicine

## 2014-02-24 DIAGNOSIS — E1142 Type 2 diabetes mellitus with diabetic polyneuropathy: Secondary | ICD-10-CM | POA: Diagnosis not present

## 2014-02-24 DIAGNOSIS — E1151 Type 2 diabetes mellitus with diabetic peripheral angiopathy without gangrene: Secondary | ICD-10-CM | POA: Insufficient documentation

## 2014-02-24 DIAGNOSIS — I10 Essential (primary) hypertension: Secondary | ICD-10-CM

## 2014-02-24 DIAGNOSIS — E1140 Type 2 diabetes mellitus with diabetic neuropathy, unspecified: Secondary | ICD-10-CM | POA: Insufficient documentation

## 2014-02-24 DIAGNOSIS — E785 Hyperlipidemia, unspecified: Secondary | ICD-10-CM

## 2014-02-24 DIAGNOSIS — IMO0002 Reserved for concepts with insufficient information to code with codable children: Secondary | ICD-10-CM

## 2014-02-24 DIAGNOSIS — E1165 Type 2 diabetes mellitus with hyperglycemia: Secondary | ICD-10-CM

## 2014-02-24 NOTE — Progress Notes (Signed)
Patient ID: Micheal [name], male   DOB: 1928-07-24, 81 y.o.   MRN: [id number]    Facility  [facility]    Place of Service:   OFFICE   Allergies  Allergen Reactions  . Aspirin Other (See Comments)    Bothers stomach, thins blood   . Atorvastatin     Muscular pain and weakness  . Metformin Hcl Nausea Only  . Prednisone Other (See Comments)    Upset stomach   . Simvastatin     Muscular pain and weakness  . Sulfa Antibiotics Other (See Comments)    Upset stomach   . Voltaren [Diclofenac Sodium]     Chief Complaint  Patient presents with  . Medical Management of Chronic Issues    HPI:  Essential hypertension; controlled  Hyperlipidemia: high LDL, but he is intolerant to statins  DM type 2, uncontrolled, with neuropathy: tryiing to improve diet  Diabetic polyneuropathy associated with type 2 diabetes mellitus: unchanged    Medications: Patient's Medications  New Prescriptions   No medications on file  Previous Medications   ACETAMINOPHEN (TYLENOL) 500 MG TABLET    Take 500 mg by mouth every 6 (six) hours as needed for pain. Take one tablet as needed for pain.   CHOLECALCIFEROL (VITAMIN D) 1000 UNITS TABLET    Take 2,000 Units by mouth daily. Take 2 capsules daily for vitamin D supplement   FAMOTIDINE (PEPCID) 20 MG TABLET    Take 20 mg by mouth as needed. Take one tablet as needed for reflux.   INSULIN GLARGINE (LANTUS) 100 UNIT/ML SOPN    Inject 20-28 Units into the skin at bedtime. Depends on sugar reading   INSULIN ISOPHANE & REGULAR HUMAN (HUMULIN 70/30 KWIKPEN) (70-30) 100 UNIT/ML PEN    Inject 12-15 units in the morning and 24-26 units in the evening to control diabetes.   INSULIN PEN NEEDLE (B-D ULTRAFINE III SHORT PEN) 31G X 8 MM MISC    Use daily with administration of insulin DX E11.9   MULTIPLE VITAMIN (MULTIVITAMIN WITH MINERALS) TABS TABLET    Take 1 tablet by mouth daily.   TAMSULOSIN (FLOMAX) 0.4 MG CAPS CAPSULE    Take one capsule once day for prostate    Modified Medications   No medications on file  Discontinued Medications   No medications on file     Review of Systems  Constitutional: Positive for fatigue. Negative for fever, chills, diaphoresis, activity change and appetite change.  HENT: Positive for hearing loss.   Eyes: Positive for visual disturbance.       Severe visual problems related to macular degeneration, retinal hemorrhage,  Respiratory: Positive for shortness of breath (on exertion).   Cardiovascular: Negative.  Negative for chest pain, palpitations and leg swelling.  Endocrine: Negative for polydipsia, polyphagia and polyuria.       Diabetic. Poor control.  Genitourinary:       Hx prostate cancer. Also may have BPH. Followed by Dr. Risa Grill.  Musculoskeletal: Positive for arthralgias. Negative for myalgias, back pain, joint swelling, gait problem and neck pain.  Skin: Negative.  Negative for pallor.  Allergic/Immunologic: Negative.   Neurological: Positive for tremors and weakness. Negative for dizziness, seizures, speech difficulty and headaches.  Hematological: Negative.   Psychiatric/Behavioral: Negative for confusion. The patient is nervous/anxious.     Filed Vitals:   02/24/14 1146  BP: 126/70  Pulse: 101  Temp: 97.7 F (36.5 C)  TempSrc: Oral  Resp: 18  Height: 5\' 7"  (1.702 m)  Weight: 157  lb (71.215 kg)  SpO2: 99%   Body mass index is 24.58 kg/(m^2).  Physical Exam  Constitutional: He is oriented to person, place, and time. He appears well-developed and well-nourished.  HENT:  Head: Normocephalic and atraumatic.  Loss of hearing.  Eyes:  Severe visual losses due to macular degeneration and retinal hemorrhages.  Neck: Normal range of motion. Neck supple. No JVD present. No tracheal deviation present. No thyromegaly present.  Cardiovascular: Normal rate, regular rhythm and normal heart sounds.   Diminished DP and PT bilaterally.  Pulmonary/Chest: Effort normal and breath sounds normal.   Abdominal: Soft. Bowel sounds are normal. He exhibits no mass. There is no guarding.  Musculoskeletal: Normal range of motion. He exhibits no edema or tenderness.  Lymphadenopathy:    He has no cervical adenopathy.  Neurological: He is alert and oriented to person, place, and time. No cranial nerve deficit. Coordination normal.  Diminished sensation to monofilament testing.  Skin: Skin is warm and dry.  Multiple SK.  Psychiatric: He has a normal mood and affect. His behavior is normal. Thought content normal.     Labs reviewed: Appointment on 02/18/2014  Component Date Value Ref Range Status  . Hgb A1c MFr Bld 02/18/2014 8.1* 4.8 - 5.6 % Final   Comment:          Pre-diabetes: 5.7 - 6.4          Diabetes: >6.4          Glycemic control for adults with diabetes: <7.0   . Est. average glucose Bld gHb Est-m* 02/18/2014 186   Final  . Glucose 02/18/2014 83  65 - 99 mg/dL Final  . BUN 02/18/2014 12  8 - 27 mg/dL Final  . Creatinine, Ser 02/18/2014 0.90  0.76 - 1.27 mg/dL Final  . GFR calc non Af Amer 02/18/2014 78  >59 mL/min/1.73 Final  . GFR calc Af Amer 02/18/2014 90  >59 mL/min/1.73 Final  . BUN/Creatinine Ratio 02/18/2014 13  10 - 22 Final  . Sodium 02/18/2014 145* 134 - 144 mmol/L Final  . Potassium 02/18/2014 4.3  3.5 - 5.2 mmol/L Final  . Chloride 02/18/2014 108  97 - 108 mmol/L Final  . CO2 02/18/2014 23  18 - 29 mmol/L Final  . Calcium 02/18/2014 10.6* 8.6 - 10.2 mg/dL Final  . Cholesterol, Total 02/18/2014 238* 100 - 199 mg/dL Final  . Triglycerides 02/18/2014 168* 0 - 149 mg/dL Final  . HDL 02/18/2014 41  >39 mg/dL Final   Comment: According to ATP-III Guidelines, HDL-C >59 mg/dL is considered a negative risk factor for CHD.   Marland Kitchen VLDL Cholesterol Cal 02/18/2014 34  5 - 40 mg/dL Final  . LDL Calculated 02/18/2014 163* 0 - 99 mg/dL Final  . Chol/HDL Ratio 02/18/2014 5.8* 0.0 - 5.0 ratio units Final   Comment:                                   T. Chol/HDL Ratio                                              Men  Women                               1/2  Avg.Risk  3.4    3.3                                   Avg.Risk  5.0    4.4                                2X Avg.Risk  9.6    7.1                                3X Avg.Risk 23.4   11.0      Assessment/Plan  1. Essential hypertension controlled - Basic metabolic panel; Future  2. Hyperlipidemia LDL is high, but he is intolerant to statins - Lipid panel; Future  3. DM type 2, uncontrolled, with neuropathy He is continuing to try to improve his diet - Hemoglobin A1c; Future - Basic metabolic panel; Future - Microalbumin, urine; Future  4. Diabetic polyneuropathy associated with type 2 diabetes mellitus unchanged

## 2014-03-10 ENCOUNTER — Other Ambulatory Visit: Payer: Self-pay

## 2014-03-10 DIAGNOSIS — E1165 Type 2 diabetes mellitus with hyperglycemia: Secondary | ICD-10-CM

## 2014-03-10 DIAGNOSIS — IMO0002 Reserved for concepts with insufficient information to code with codable children: Secondary | ICD-10-CM

## 2014-03-10 MED ORDER — INSULIN ISOPHANE & REGULAR (HUMAN 70-30)100 UNIT/ML KWIKPEN: PEN_INJECTOR | SUBCUTANEOUS | Status: DC

## 2014-03-10 MED ORDER — INSULIN PEN NEEDLE 31G X 8 MM MISC: Status: DC

## 2014-04-02 DIAGNOSIS — H3531 Nonexudative age-related macular degeneration: Secondary | ICD-10-CM | POA: Diagnosis not present

## 2014-04-02 DIAGNOSIS — H3532 Exudative age-related macular degeneration: Secondary | ICD-10-CM | POA: Diagnosis not present

## 2014-04-02 DIAGNOSIS — H35732 Hemorrhagic detachment of retinal pigment epithelium, left eye: Secondary | ICD-10-CM | POA: Diagnosis not present

## 2014-04-29 ENCOUNTER — Other Ambulatory Visit: Payer: Self-pay | Admitting: Internal Medicine

## 2014-05-05 DIAGNOSIS — L570 Actinic keratosis: Secondary | ICD-10-CM | POA: Diagnosis not present

## 2014-05-05 DIAGNOSIS — X32XXXD Exposure to sunlight, subsequent encounter: Secondary | ICD-10-CM | POA: Diagnosis not present

## 2014-05-05 DIAGNOSIS — D225 Melanocytic nevi of trunk: Secondary | ICD-10-CM | POA: Diagnosis not present

## 2014-05-10 ENCOUNTER — Other Ambulatory Visit: Payer: Self-pay | Admitting: *Deleted

## 2014-05-10 MED ORDER — TAMSULOSIN HCL 0.4 MG PO CAPS: ORAL_CAPSULE | ORAL | Status: DC

## 2014-05-10 NOTE — Telephone Encounter (Signed)
[facility] 

## 2014-05-14 ENCOUNTER — Other Ambulatory Visit: Payer: Medicare Other

## 2014-05-14 DIAGNOSIS — E785 Hyperlipidemia, unspecified: Secondary | ICD-10-CM

## 2014-05-14 DIAGNOSIS — E1165 Type 2 diabetes mellitus with hyperglycemia: Principal | ICD-10-CM

## 2014-05-14 DIAGNOSIS — E1140 Type 2 diabetes mellitus with diabetic neuropathy, unspecified: Secondary | ICD-10-CM

## 2014-05-14 DIAGNOSIS — I10 Essential (primary) hypertension: Secondary | ICD-10-CM

## 2014-05-14 DIAGNOSIS — IMO0002 Reserved for concepts with insufficient information to code with codable children: Secondary | ICD-10-CM

## 2014-05-15 LAB — LIPID PANEL
Chol/HDL Ratio: 5.3 ratio units — ABNORMAL HIGH (ref 0.0–5.0)
Cholesterol, Total: 242 mg/dL — ABNORMAL HIGH (ref 100–199)
HDL: 46 mg/dL (ref >39)
LDL Calculated: 166 mg/dL — ABNORMAL HIGH (ref 0–99)
Triglycerides: 151 mg/dL — ABNORMAL HIGH (ref 0–149)
VLDL Cholesterol Cal: 30 mg/dL (ref 5–40)

## 2014-05-15 LAB — BASIC METABOLIC PANEL
BUN/Creatinine Ratio: 11 (ref 10–22)
BUN: 11 mg/dL (ref 8–27)
CO2: 23 mmol/L (ref 18–29)
Calcium: 10.7 mg/dL — ABNORMAL HIGH (ref 8.6–10.2)
Chloride: 108 mmol/L (ref 97–108)
Creatinine, Ser: 0.98 mg/dL (ref 0.76–1.27)
GFR calc Af Amer: 82 mL/min/{1.73_m2} (ref >59)
GFR calc non Af Amer: 71 mL/min/{1.73_m2} (ref >59)
Glucose: 69 mg/dL (ref 65–99)
Potassium: 5.2 mmol/L (ref 3.5–5.2)
Sodium: 144 mmol/L (ref 134–144)

## 2014-05-15 LAB — HEMOGLOBIN A1C
Est. average glucose Bld gHb Est-mCnc: 177 mg/dL
Hgb A1c MFr Bld: 7.8 % — ABNORMAL HIGH (ref 4.8–5.6)

## 2014-05-15 LAB — MICROALBUMIN, URINE: Microalbumin, Urine: 7.5 ug/mL (ref 0.0–17.0)

## 2014-05-18 ENCOUNTER — Encounter: Payer: Self-pay | Admitting: Internal Medicine

## 2014-05-18 ENCOUNTER — Ambulatory Visit (INDEPENDENT_AMBULATORY_CARE_PROVIDER_SITE_OTHER): Payer: Medicare Other | Admitting: Internal Medicine

## 2014-05-18 VITALS — BP 126/80 | HR 90 | Temp 97.6°F | Ht 67.0 in | Wt 157.0 lb

## 2014-05-18 DIAGNOSIS — E1165 Type 2 diabetes mellitus with hyperglycemia: Secondary | ICD-10-CM

## 2014-05-18 DIAGNOSIS — I10 Essential (primary) hypertension: Secondary | ICD-10-CM

## 2014-05-18 DIAGNOSIS — E1140 Type 2 diabetes mellitus with diabetic neuropathy, unspecified: Secondary | ICD-10-CM | POA: Diagnosis not present

## 2014-05-18 DIAGNOSIS — Z23 Encounter for immunization: Secondary | ICD-10-CM

## 2014-05-18 DIAGNOSIS — E785 Hyperlipidemia, unspecified: Secondary | ICD-10-CM | POA: Diagnosis not present

## 2014-05-18 DIAGNOSIS — M25561 Pain in right knee: Secondary | ICD-10-CM | POA: Insufficient documentation

## 2014-05-18 DIAGNOSIS — IMO0002 Reserved for concepts with insufficient information to code with codable children: Secondary | ICD-10-CM

## 2014-05-18 MED ORDER — INSULIN GLARGINE 100 UNIT/ML SOLOSTAR PEN: PEN_INJECTOR | SUBCUTANEOUS | Status: DC

## 2014-05-18 MED ORDER — TRIAMCINOLONE ACETONIDE 40 MG/ML IJ SUSP
40.0000 mg | Freq: Once | INTRAMUSCULAR | Status: AC
  Administered 2014-05-18: 40 mg via INTRA_ARTICULAR

## 2014-05-18 MED ORDER — INSULIN PEN NEEDLE 31G X 8 MM MISC: Status: DC

## 2014-05-18 MED ORDER — INSULIN ISOPHANE & REGULAR (HUMAN 70-30)100 UNIT/ML KWIKPEN: PEN_INJECTOR | SUBCUTANEOUS | Status: DC

## 2014-05-18 NOTE — Progress Notes (Signed)
Patient ID: Micheal [name], male   DOB: 01-04-1929, 81 y.o.   MRN: [id number]    Facility  [facility]    Place of Service:   OFFICE    Allergies  Allergen Reactions  . Aspirin Other (See Comments)    Bothers stomach, thins blood   . Atorvastatin     Muscular pain and weakness  . Metformin Hcl Nausea Only  . Prednisone Other (See Comments)    Upset stomach   . Simvastatin     Muscular pain and weakness  . Sulfa Antibiotics Other (See Comments)    Upset stomach   . Voltaren [Diclofenac Sodium]     Chief Complaint  Patient presents with  . Medical Management of Chronic Issues    3 month follow-up, discuss labs (copy printed)   . Immunizations    Discuss need for Prevnar     HPI:    Type II diabetes mellitus, uncontrolled -stable  DM type 2, uncontrolled, with neuropathy: stable  Essential hypertension: controlled  Hyperlipidemia: stable  Knee pain, right: Pain in the right leg and knee. Hx of injx from Dr. Veverly Fells. Last several months. Difficult to stand from seated position.    Medications: Patient's Medications  New Prescriptions   No medications on file  Previous Medications   ACETAMINOPHEN (TYLENOL) 500 MG TABLET    Take 500 mg by mouth every 6 (six) hours as needed for pain. Take one tablet as needed for pain.   CHOLECALCIFEROL (VITAMIN D) 1000 UNITS TABLET    Take 2,000 Units by mouth daily. Take 2 capsules daily for vitamin D supplement   FAMOTIDINE (PEPCID) 20 MG TABLET    Take 20 mg by mouth as needed. Take one tablet as needed for reflux.   MULTIPLE VITAMIN (MULTIVITAMIN WITH MINERALS) TABS TABLET    Take 1 tablet by mouth daily.   TAMSULOSIN (FLOMAX) 0.4 MG CAPS CAPSULE    Take one capsule once day for prostate  Modified Medications   Modified Medication Previous Medication   INSULIN GLARGINE (LANTUS SOLOSTAR) 100 UNIT/ML SOLOSTAR PEN LANTUS SOLOSTAR 100 UNIT/ML Solostar Pen      INJECT 28-30 UNITS AT BEDTIME FOR DIABETES.    INJECT 20 UNITS AT BEDTIME  FOR DIABETES.   INSULIN ISOPHANE & REGULAR HUMAN (HUMULIN 70/30 KWIKPEN) (70-30) 100 UNIT/ML PEN Insulin Isophane & Regular Human (HUMULIN 70/30 KWIKPEN) (70-30) 100 UNIT/ML PEN      Inject 12-15 units in the morning  to control diabetes. DX E11.40, E11.65    Inject 12-15 units in the morning and 28-30 units in the evening to control diabetes. DX E11.40, E11.65   INSULIN PEN NEEDLE (B-D ULTRAFINE III SHORT PEN) 31G X 8 MM MISC Insulin Pen Needle (B-D ULTRAFINE III SHORT PEN) 31G X 8 MM MISC      Use twice daily with administration of insulin and humulin DX E11.40, E11.65    Use daily with administration of insulin DX E11.40, E11.65  Discontinued Medications   INSULIN GLARGINE (LANTUS) 100 UNIT/ML SOPN    Inject 20-28 Units into the skin at bedtime. Depends on sugar reading     Review of Systems  Constitutional: Positive for fatigue. Negative for fever, chills, diaphoresis, activity change and appetite change.  HENT: Positive for hearing loss.   Eyes: Positive for visual disturbance.       Severe visual problems related to macular degeneration, retinal hemorrhage,  Respiratory: Positive for shortness of breath (on exertion).   Cardiovascular: Negative.  Negative for chest pain,  palpitations and leg swelling.  Endocrine: Negative for polydipsia, polyphagia and polyuria.       Diabetic. Poor control.  Genitourinary:       Hx prostate cancer. Also may have BPH. Followed by Dr. Risa Grill.  Musculoskeletal: Positive for arthralgias. Negative for myalgias, back pain, joint swelling, gait problem and neck pain.  Skin: Negative.  Negative for pallor.  Allergic/Immunologic: Negative.   Neurological: Positive for tremors and weakness. Negative for dizziness, seizures, speech difficulty and headaches.  Hematological: Negative.   Psychiatric/Behavioral: Negative for confusion. The patient is nervous/anxious.     Filed Vitals:   05/18/14 1554  BP: 126/80  Pulse: 90  Temp: 97.6 F (36.4 C)  TempSrc:  Oral  Height: 5\' 7"  (1.702 m)  Weight: 157 lb (71.215 kg)  SpO2: 96%   Body mass index is 24.58 kg/(m^2).  Physical Exam  Constitutional: He is oriented to person, place, and time. He appears well-developed and well-nourished.  HENT:  Head: Normocephalic and atraumatic.  Loss of hearing.  Eyes:  Severe visual losses due to macular degeneration and retinal hemorrhages.  Neck: Normal range of motion. Neck supple. No JVD present. No tracheal deviation present. No thyromegaly present.  Cardiovascular: Normal rate, regular rhythm and normal heart sounds.   Diminished DP and PT bilaterally.  Pulmonary/Chest: Effort normal and breath sounds normal.  Abdominal: Soft. Bowel sounds are normal. He exhibits no mass. There is no guarding.  Musculoskeletal: Normal range of motion. He exhibits no edema or tenderness.  Plan the right knee with movement. Mild crepitance. Very small effusion of the knee.  Lymphadenopathy:    He has no cervical adenopathy.  Neurological: He is alert and oriented to person, place, and time. No cranial nerve deficit. Coordination normal.  Diminished sensation to monofilament testing.  Skin: Skin is warm and dry.  Multiple SK.  Psychiatric: He has a normal mood and affect. His behavior is normal. Thought content normal.     Labs reviewed: Appointment on 05/14/2014  Component Date Value Ref Range Status  . Hgb A1c MFr Bld 05/14/2014 7.8* 4.8 - 5.6 % Final   Comment:          Pre-diabetes: 5.7 - 6.4          Diabetes: >6.4          Glycemic control for adults with diabetes: <7.0   . Est. average glucose Bld gHb Est-m* 05/14/2014 177   Final  . Glucose 05/14/2014 69  65 - 99 mg/dL Final  . BUN 05/14/2014 11  8 - 27 mg/dL Final  . Creatinine, Ser 05/14/2014 0.98  0.76 - 1.27 mg/dL Final  . GFR calc non Af Amer 05/14/2014 71  >59 mL/min/1.73 Final  . GFR calc Af Amer 05/14/2014 82  >59 mL/min/1.73 Final  . BUN/Creatinine Ratio 05/14/2014 11  10 - 22 Final  .  Sodium 05/14/2014 144  134 - 144 mmol/L Final  . Potassium 05/14/2014 5.2  3.5 - 5.2 mmol/L Final  . Chloride 05/14/2014 108  97 - 108 mmol/L Final  . CO2 05/14/2014 23  18 - 29 mmol/L Final  . Calcium 05/14/2014 10.7* 8.6 - 10.2 mg/dL Final  . Microalbum.,U,Random 05/14/2014 7.5  0.0 - 17.0 ug/mL Final   Comment: **Effective May 31, 2014 the reference interval for**   Microalbumin, Urine will be changing to:  Not Estab.   . Cholesterol, Total 05/14/2014 242* 100 - 199 mg/dL Final  . Triglycerides 05/14/2014 151* 0 - 149 mg/dL Final  . HDL 05/14/2014 46  >39 mg/dL Final   Comment: According to ATP-III Guidelines, HDL-C >59 mg/dL is considered a negative risk factor for CHD.   Marland Kitchen VLDL Cholesterol Cal 05/14/2014 30  5 - 40 mg/dL Final  . LDL Calculated 05/14/2014 166* 0 - 99 mg/dL Final  . Chol/HDL Ratio 05/14/2014 5.3* 0.0 - 5.0 ratio units Final   Comment:                                   T. Chol/HDL Ratio                                             Men  Women                               1/2 Avg.Risk  3.4    3.3                                   Avg.Risk  5.0    4.4                                2X Avg.Risk  9.6    7.1                                3X Avg.Risk 23.4   11.0   Appointment on 02/18/2014  Component Date Value Ref Range Status  . Hgb A1c MFr Bld 02/18/2014 8.1* 4.8 - 5.6 % Final   Comment:          Pre-diabetes: 5.7 - 6.4          Diabetes: >6.4          Glycemic control for adults with diabetes: <7.0   . Est. average glucose Bld gHb Est-m* 02/18/2014 186   Final  . Glucose 02/18/2014 83  65 - 99 mg/dL Final  . BUN 02/18/2014 12  8 - 27 mg/dL Final  . Creatinine, Ser 02/18/2014 0.90  0.76 - 1.27 mg/dL Final  . GFR calc non Af Amer 02/18/2014 78  >59 mL/min/1.73 Final  . GFR calc Af Amer 02/18/2014 90  >59 mL/min/1.73 Final  . BUN/Creatinine Ratio 02/18/2014 13  10 - 22 Final  . Sodium 02/18/2014 145* 134 - 144 mmol/L Final    . Potassium 02/18/2014 4.3  3.5 - 5.2 mmol/L Final  . Chloride 02/18/2014 108  97 - 108 mmol/L Final  . CO2 02/18/2014 23  18 - 29 mmol/L Final  . Calcium 02/18/2014 10.6* 8.6 - 10.2 mg/dL Final  . Cholesterol, Total 02/18/2014 238* 100 - 199 mg/dL Final  . Triglycerides 02/18/2014 168* 0 - 149 mg/dL Final  . HDL 02/18/2014 41  >39 mg/dL Final   Comment: According to ATP-III Guidelines, HDL-C >59 mg/dL is considered a negative risk factor for CHD.   Marland Kitchen VLDL Cholesterol Cal 02/18/2014 34  5 - 40 mg/dL Final  .  LDL Calculated 02/18/2014 163* 0 - 99 mg/dL Final  . Chol/HDL Ratio 02/18/2014 5.8* 0.0 - 5.0 ratio units Final   Comment:                                   T. Chol/HDL Ratio                                             Men  Women                               1/2 Avg.Risk  3.4    3.3                                   Avg.Risk  5.0    4.4                                2X Avg.Risk  9.6    7.1                                3X Avg.Risk 23.4   11.0      Assessment/Plan  1. Type II diabetes mellitus, uncontrolled - Insulin Isophane & Regular Human (HUMULIN 70/30 KWIKPEN) (70-30) 100 UNIT/ML PEN; Inject 12-15 units in the morning  to control diabetes. DX E11.40, E11.65  Dispense: 5 pen; Refill: 5 - Insulin Pen Needle (B-D ULTRAFINE III SHORT PEN) 31G X 8 MM MISC; Use twice daily with administration of insulin and humulin DX E11.40, E11.65  Dispense: 200 each; Refill: 11 - Hemoglobin A1c; Future - Basic metabolic panel; Future - Pneumococcal conjugate vaccine 13-valent  2. DM type 2, uncontrolled, with neuropathy Continue current medications  3. Essential hypertension - Basic metabolic panel; Future  4. Hyperlipidem - Lipid panel; Future  5. Knee pain, right Injected with triamcinolone acetonide 1 mL and 1% lidocaine 2 mL after Betadine prep of the skin. Patient tolerated procedure well and expressed relief from the discomfort of his knee following the procedure. -  triamcinolone acetonide (KENALOG-40) injection 40 mg; Inject 1 mL (40 mg total) into the articular space once.  6. Need for vaccination with 13-polyvalent pneumococcal conjugate vaccine - Pneumococcal conjugate vaccine 13-valent

## 2014-06-11 DIAGNOSIS — H3532 Exudative age-related macular degeneration: Secondary | ICD-10-CM | POA: Diagnosis not present

## 2014-08-02 ENCOUNTER — Other Ambulatory Visit: Payer: Self-pay | Admitting: Internal Medicine

## 2014-08-27 DIAGNOSIS — H3532 Exudative age-related macular degeneration: Secondary | ICD-10-CM | POA: Diagnosis not present

## 2014-09-09 DIAGNOSIS — G8929 Other chronic pain: Secondary | ICD-10-CM | POA: Diagnosis not present

## 2014-09-09 DIAGNOSIS — M545 Low back pain: Secondary | ICD-10-CM | POA: Diagnosis not present

## 2014-09-10 ENCOUNTER — Other Ambulatory Visit: Payer: Medicare Other

## 2014-09-10 ENCOUNTER — Telehealth: Payer: Self-pay | Admitting: *Deleted

## 2014-09-10 DIAGNOSIS — E1165 Type 2 diabetes mellitus with hyperglycemia: Secondary | ICD-10-CM | POA: Diagnosis not present

## 2014-09-10 DIAGNOSIS — I10 Essential (primary) hypertension: Secondary | ICD-10-CM

## 2014-09-10 DIAGNOSIS — M545 Low back pain: Secondary | ICD-10-CM | POA: Diagnosis not present

## 2014-09-10 DIAGNOSIS — M5136 Other intervertebral disc degeneration, lumbar region: Secondary | ICD-10-CM | POA: Diagnosis not present

## 2014-09-10 DIAGNOSIS — E785 Hyperlipidemia, unspecified: Secondary | ICD-10-CM

## 2014-09-10 DIAGNOSIS — IMO0002 Reserved for concepts with insufficient information to code with codable children: Secondary | ICD-10-CM

## 2014-09-10 NOTE — Telephone Encounter (Signed)
Received [facility] Form for Diabetic Supplies. Filled out and given to Dr. Nyoka Cowden to review and sign.  #[phone] Fax: [phone]

## 2014-09-11 LAB — BASIC METABOLIC PANEL
BUN/Creatinine Ratio: 14 (ref 10–22)
BUN: 14 mg/dL (ref 8–27)
CO2: 23 mmol/L (ref 18–29)
Calcium: 11 mg/dL — ABNORMAL HIGH (ref 8.6–10.2)
Chloride: 107 mmol/L (ref 97–108)
Creatinine, Ser: 0.98 mg/dL (ref 0.76–1.27)
GFR calc Af Amer: 82 mL/min/{1.73_m2} (ref >59)
GFR calc non Af Amer: 71 mL/min/{1.73_m2} (ref >59)
Glucose: 127 mg/dL — ABNORMAL HIGH (ref 65–99)
Potassium: 5.1 mmol/L (ref 3.5–5.2)
Sodium: 142 mmol/L (ref 134–144)

## 2014-09-11 LAB — LIPID PANEL
Chol/HDL Ratio: 6 ratio units — ABNORMAL HIGH (ref 0.0–5.0)
Cholesterol, Total: 250 mg/dL — ABNORMAL HIGH (ref 100–199)
HDL: 42 mg/dL (ref >39)
LDL Calculated: 168 mg/dL — ABNORMAL HIGH (ref 0–99)
Triglycerides: 202 mg/dL — ABNORMAL HIGH (ref 0–149)
VLDL Cholesterol Cal: 40 mg/dL (ref 5–40)

## 2014-09-11 LAB — HEMOGLOBIN A1C
Est. average glucose Bld gHb Est-mCnc: 192 mg/dL
Hgb A1c MFr Bld: 8.3 % — ABNORMAL HIGH (ref 4.8–5.6)

## 2014-09-14 ENCOUNTER — Encounter: Payer: Self-pay | Admitting: Internal Medicine

## 2014-09-14 ENCOUNTER — Ambulatory Visit (INDEPENDENT_AMBULATORY_CARE_PROVIDER_SITE_OTHER): Payer: Medicare Other | Admitting: Internal Medicine

## 2014-09-14 VITALS — BP 108/82 | HR 85 | Temp 97.8°F | Resp 20 | Ht 67.0 in | Wt 161.4 lb

## 2014-09-14 DIAGNOSIS — M545 Low back pain, unspecified: Secondary | ICD-10-CM

## 2014-09-14 DIAGNOSIS — I10 Essential (primary) hypertension: Secondary | ICD-10-CM | POA: Diagnosis not present

## 2014-09-14 DIAGNOSIS — E1165 Type 2 diabetes mellitus with hyperglycemia: Secondary | ICD-10-CM | POA: Diagnosis not present

## 2014-09-14 DIAGNOSIS — E1140 Type 2 diabetes mellitus with diabetic neuropathy, unspecified: Secondary | ICD-10-CM | POA: Diagnosis not present

## 2014-09-14 DIAGNOSIS — IMO0002 Reserved for concepts with insufficient information to code with codable children: Secondary | ICD-10-CM

## 2014-09-14 DIAGNOSIS — M25511 Pain in right shoulder: Secondary | ICD-10-CM | POA: Diagnosis not present

## 2014-09-14 DIAGNOSIS — E785 Hyperlipidemia, unspecified: Secondary | ICD-10-CM

## 2014-09-14 MED ORDER — INSULIN GLARGINE 100 UNIT/ML SOLOSTAR PEN: PEN_INJECTOR | SUBCUTANEOUS | Status: DC

## 2014-09-14 MED ORDER — INSULIN ISOPHANE & REGULAR (HUMAN 70-30)100 UNIT/ML KWIKPEN: PEN_INJECTOR | SUBCUTANEOUS | Status: DC

## 2014-09-14 NOTE — Progress Notes (Signed)
Patient ID: Micheal [name], male   DOB: 27-Sep-1928, 81 y.o.   MRN: [id number]    Facility  [facility]    Place of Service:   OFFICE    Allergies  Allergen Reactions  . Aspirin Other (See Comments)    Bothers stomach, thins blood   . Atorvastatin     Muscular pain and weakness  . Metformin Hcl Nausea Only  . Prednisone Other (See Comments)    Upset stomach   . Simvastatin     Muscular pain and weakness  . Sulfa Antibiotics Other (See Comments)    Upset stomach   . Voltaren [Diclofenac Sodium]     Chief Complaint  Patient presents with  . Medical Management of Chronic Issues    4 month follow-up, labs printed    HPI:  Midline low back pain without sciatica: Low back pain for 2 weeks. Saw orth. To see Dr. Nelva Bush for injection on 09/17/14. Using oxycodone now.   Right shoulder pain: Painful to elevate and rotate the right shoulder.  DM type 2, uncontrolled, with neuropathy: poor control. He wants to try to manage diet better.  Essential hypertension: controlled  Hyperlipidemia: poor control. He wants to manage his diet better. He is intolerant to statins.    Medications: Patient's Medications  New Prescriptions   No medications on file  Previous Medications   ACETAMINOPHEN (TYLENOL) 500 MG TABLET    Take 500 mg by mouth every 6 (six) hours as needed for pain. Take one tablet as needed for pain.   CHOLECALCIFEROL (VITAMIN D) 1000 UNITS TABLET    Take 2,000 Units by mouth daily. Take 2 capsules daily for vitamin D supplement   FAMOTIDINE (PEPCID) 20 MG TABLET    Take 20 mg by mouth as needed. Take one tablet as needed for reflux.   INSULIN GLARGINE (LANTUS SOLOSTAR) 100 UNIT/ML SOLOSTAR PEN    INJECT 28-30 UNITS AT BEDTIME FOR DIABETES.   INSULIN ISOPHANE & REGULAR HUMAN (HUMULIN 70/30 KWIKPEN) (70-30) 100 UNIT/ML PEN    Inject 12-15 units in the morning  to control diabetes. DX E11.40, E11.65   INSULIN PEN NEEDLE (B-D ULTRAFINE III SHORT PEN) 31G X 8 MM MISC    Use twice  daily with administration of insulin and humulin DX E11.40, E11.65   MULTIPLE VITAMIN (MULTIVITAMIN WITH MINERALS) TABS TABLET    Take 1 tablet by mouth daily.   NYSTATIN-TRIAMCINOLONE (MYCOLOG II) CREAM    APPLY TOPICALLY 4 TIMES DAY TO RASH   TAMSULOSIN (FLOMAX) 0.4 MG CAPS CAPSULE    Take one capsule once day for prostate  Modified Medications   No medications on file  Discontinued Medications   No medications on file     Review of Systems  Constitutional: Positive for fatigue. Negative for fever, chills, diaphoresis, activity change and appetite change.  HENT: Positive for hearing loss.   Eyes: Positive for visual disturbance.       Severe visual problems related to macular degeneration, retinal hemorrhage,  Respiratory: Positive for shortness of breath (on exertion).   Cardiovascular: Negative.  Negative for chest pain, palpitations and leg swelling.  Endocrine: Negative for polydipsia, polyphagia and polyuria.       Diabetic. Poor control.  Genitourinary:       Hx prostate cancer. Also may have BPH. Followed by Dr. Risa Grill.  Musculoskeletal: Positive for arthralgias. Negative for myalgias, back pain, joint swelling, gait problem and neck pain.  Skin: Negative.  Negative for pallor.  Allergic/Immunologic: Negative.  Neurological: Positive for tremors and weakness. Negative for dizziness, seizures, speech difficulty and headaches.  Hematological: Negative.   Psychiatric/Behavioral: Negative for confusion. The patient is nervous/anxious.     Filed Vitals:   09/14/14 1418  BP: 108/82  Pulse: 85  Temp: 97.8 F (36.6 C)  TempSrc: Oral  Resp: 20  Height: 5\' 7"  (1.702 m)  Weight: 161 lb 6.4 oz (73.211 kg)  SpO2: 94%   Body mass index is 25.27 kg/(m^2).  Physical Exam  Constitutional: He is oriented to person, place, and time. He appears well-developed and well-nourished.  HENT:  Head: Normocephalic and atraumatic.  Loss of hearing.  Eyes:  Severe visual losses due to  macular degeneration and retinal hemorrhages.  Neck: Normal range of motion. Neck supple. No JVD present. No tracheal deviation present. No thyromegaly present.  Cardiovascular: Normal rate, regular rhythm and normal heart sounds.   Diminished DP and PT bilaterally.  Pulmonary/Chest: Effort normal and breath sounds normal.  Abdominal: Soft. Bowel sounds are normal. He exhibits no mass. There is no guarding.  Musculoskeletal: Normal range of motion. He exhibits no edema or tenderness.  Plan the right knee with movement. Mild crepitance. Very small effusion of the knee.  Lymphadenopathy:    He has no cervical adenopathy.  Neurological: He is alert and oriented to person, place, and time. No cranial nerve deficit. Coordination normal.  Diminished sensation to monofilament testing.  Skin: Skin is warm and dry.  Multiple SK.  Psychiatric: He has a normal mood and affect. His behavior is normal. Thought content normal.     Labs reviewed: Appointment on 09/10/2014  Component Date Value Ref Range Status  . Hgb A1c MFr Bld 09/10/2014 8.3* 4.8 - 5.6 % Final   Comment:          Pre-diabetes: 5.7 - 6.4          Diabetes: >6.4          Glycemic control for adults with diabetes: <7.0   . Est. average glucose Bld gHb Est-m* 09/10/2014 192   Final  . Glucose 09/10/2014 127* 65 - 99 mg/dL Final  . BUN 09/10/2014 14  8 - 27 mg/dL Final  . Creatinine, Ser 09/10/2014 0.98  0.76 - 1.27 mg/dL Final  . GFR calc non Af Amer 09/10/2014 71  >59 mL/min/1.73 Final  . GFR calc Af Amer 09/10/2014 82  >59 mL/min/1.73 Final  . BUN/Creatinine Ratio 09/10/2014 14  10 - 22 Final  . Sodium 09/10/2014 142  134 - 144 mmol/L Final  . Potassium 09/10/2014 5.1  3.5 - 5.2 mmol/L Final  . Chloride 09/10/2014 107  97 - 108 mmol/L Final  . CO2 09/10/2014 23  18 - 29 mmol/L Final  . Calcium 09/10/2014 11.0* 8.6 - 10.2 mg/dL Final  . Cholesterol, Total 09/10/2014 250* 100 - 199 mg/dL Final  . Triglycerides 09/10/2014 202* 0  - 149 mg/dL Final  . HDL 09/10/2014 42  >39 mg/dL Final   Comment: According to ATP-III Guidelines, HDL-C >59 mg/dL is considered a negative risk factor for CHD.   Marland Kitchen VLDL Cholesterol Cal 09/10/2014 40  5 - 40 mg/dL Final  . LDL Calculated 09/10/2014 168* 0 - 99 mg/dL Final  . Chol/HDL Ratio 09/10/2014 6.0* 0.0 - 5.0 ratio units Final   Comment:  T. Chol/HDL Ratio                                             Men  Women                               1/2 Avg.Risk  3.4    3.3                                   Avg.Risk  5.0    4.4                                2X Avg.Risk  9.6    7.1                                3X Avg.Risk 23.4   11.0      Assessment/Plan  1. Midline low back pain without sciatica Appointment scheduled see Dr. Nelva Bush for injection  2. Right shoulder pain Follow-up orthopedist  3. DM type 2, uncontrolled, with neuropathy - Insulin Glargine (LANTUS SOLOSTAR) 100 UNIT/ML Solostar Pen; INJECT 30 UNITS AT BEDTIME FOR DIABETES.  Dispense: 15 pen; Refill: 5 - Insulin Isophane & Regular Human (HUMULIN 70/30 KWIKPEN) (70-30) 100 UNIT/ML PEN; Inject 18 units in the morning  to control diabetes. DX E11.40, E11.65  Dispense: 5 pen; Refill: 5 - Hemoglobin A1c; Future - Comprehensive metabolic panel; Future  4. Essential hypertension Controlled - Comprehensive metabolic panel; Future  5. Hyperlipidemia Elevated total cholesterol and LDL. Intolerance to statins. Continue to focus on diet. - Lipid panel; Future

## 2014-09-24 DIAGNOSIS — M545 Low back pain: Secondary | ICD-10-CM | POA: Diagnosis not present

## 2014-09-24 DIAGNOSIS — M7541 Impingement syndrome of right shoulder: Secondary | ICD-10-CM | POA: Diagnosis not present

## 2014-09-24 DIAGNOSIS — M47816 Spondylosis without myelopathy or radiculopathy, lumbar region: Secondary | ICD-10-CM | POA: Diagnosis not present

## 2014-09-29 DIAGNOSIS — M5136 Other intervertebral disc degeneration, lumbar region: Secondary | ICD-10-CM | POA: Diagnosis not present

## 2014-09-29 DIAGNOSIS — M545 Low back pain: Secondary | ICD-10-CM | POA: Diagnosis not present

## 2014-10-18 ENCOUNTER — Other Ambulatory Visit: Payer: Self-pay | Admitting: Internal Medicine

## 2014-11-01 DIAGNOSIS — Z23 Encounter for immunization: Secondary | ICD-10-CM | POA: Diagnosis not present

## 2014-11-02 DIAGNOSIS — C61 Malignant neoplasm of prostate: Secondary | ICD-10-CM | POA: Diagnosis not present

## 2014-11-05 DIAGNOSIS — N3941 Urge incontinence: Secondary | ICD-10-CM | POA: Diagnosis not present

## 2014-11-05 DIAGNOSIS — R339 Retention of urine, unspecified: Secondary | ICD-10-CM | POA: Diagnosis not present

## 2014-11-05 DIAGNOSIS — Z8546 Personal history of malignant neoplasm of prostate: Secondary | ICD-10-CM | POA: Diagnosis not present

## 2014-11-15 DIAGNOSIS — G8929 Other chronic pain: Secondary | ICD-10-CM | POA: Diagnosis not present

## 2014-11-15 DIAGNOSIS — M25511 Pain in right shoulder: Secondary | ICD-10-CM | POA: Diagnosis not present

## 2014-11-23 DIAGNOSIS — M25511 Pain in right shoulder: Secondary | ICD-10-CM | POA: Diagnosis not present

## 2014-11-29 ENCOUNTER — Other Ambulatory Visit: Payer: Self-pay | Admitting: Internal Medicine

## 2014-12-20 DIAGNOSIS — H353233 Exudative age-related macular degeneration, bilateral, with inactive scar: Secondary | ICD-10-CM | POA: Diagnosis not present

## 2014-12-21 ENCOUNTER — Telehealth: Payer: Self-pay | Admitting: *Deleted

## 2014-12-21 NOTE — Telephone Encounter (Signed)
Ms. Schoepp called and stated that they were changing Diabetic Supply companies. Received form from [facility] [phone] Fax: [phone]. Filled out and given to Dr. Nyoka Cowden to review and sign.

## 2014-12-23 ENCOUNTER — Telehealth: Payer: Self-pay | Admitting: *Deleted

## 2014-12-23 NOTE — Telephone Encounter (Signed)
Received Surgery Clearance from [facility] [phone] for Right shoulder arthroscopy, SAD, open rotator cuff repair, open biceps tenodesis, open DCR, RCR open acute/less than 3 months, Biceps tenodesis open, open resection distal clavical. To be faxed back to [facility] Fax: [phone]  Patient has an appointment for 01/14/15, placed in [place] filling cabinet

## 2015-01-14 ENCOUNTER — Other Ambulatory Visit: Payer: Medicare Other

## 2015-01-14 DIAGNOSIS — I10 Essential (primary) hypertension: Secondary | ICD-10-CM | POA: Diagnosis not present

## 2015-01-14 DIAGNOSIS — E785 Hyperlipidemia, unspecified: Secondary | ICD-10-CM | POA: Diagnosis not present

## 2015-01-14 DIAGNOSIS — E1165 Type 2 diabetes mellitus with hyperglycemia: Secondary | ICD-10-CM | POA: Diagnosis not present

## 2015-01-14 DIAGNOSIS — E1140 Type 2 diabetes mellitus with diabetic neuropathy, unspecified: Secondary | ICD-10-CM

## 2015-01-14 DIAGNOSIS — IMO0002 Reserved for concepts with insufficient information to code with codable children: Secondary | ICD-10-CM

## 2015-01-15 LAB — LIPID PANEL
Chol/HDL Ratio: 6.6 ratio units — ABNORMAL HIGH (ref 0.0–5.0)
Cholesterol, Total: 277 mg/dL — ABNORMAL HIGH (ref 100–199)
HDL: 42 mg/dL (ref >39)
LDL Calculated: 187 mg/dL — ABNORMAL HIGH (ref 0–99)
Triglycerides: 239 mg/dL — ABNORMAL HIGH (ref 0–149)
VLDL Cholesterol Cal: 48 mg/dL — ABNORMAL HIGH (ref 5–40)

## 2015-01-15 LAB — COMPREHENSIVE METABOLIC PANEL
ALT: 18 IU/L (ref 0–44)
AST: 18 IU/L (ref 0–40)
Albumin/Globulin Ratio: 2.5 (ref 1.1–2.5)
Albumin: 4.5 g/dL (ref 3.5–4.7)
Alkaline Phosphatase: 105 IU/L (ref 39–117)
BUN/Creatinine Ratio: 10 (ref 10–22)
BUN: 10 mg/dL (ref 8–27)
Bilirubin Total: 0.4 mg/dL (ref 0.0–1.2)
CO2: 24 mmol/L (ref 18–29)
Calcium: 10.8 mg/dL — ABNORMAL HIGH (ref 8.6–10.2)
Chloride: 104 mmol/L (ref 96–106)
Creatinine, Ser: 0.96 mg/dL (ref 0.76–1.27)
GFR calc Af Amer: 84 mL/min/{1.73_m2} (ref >59)
GFR calc non Af Amer: 72 mL/min/{1.73_m2} (ref >59)
Globulin, Total: 1.8 g/dL (ref 1.5–4.5)
Glucose: 120 mg/dL — ABNORMAL HIGH (ref 65–99)
Potassium: 4.3 mmol/L (ref 3.5–5.2)
Sodium: 143 mmol/L (ref 134–144)
Total Protein: 6.3 g/dL (ref 6.0–8.5)

## 2015-01-15 LAB — HEMOGLOBIN A1C
Est. average glucose Bld gHb Est-mCnc: 180 mg/dL
Hgb A1c MFr Bld: 7.9 % — ABNORMAL HIGH (ref 4.8–5.6)

## 2015-01-19 ENCOUNTER — Encounter: Payer: Self-pay | Admitting: Internal Medicine

## 2015-01-19 ENCOUNTER — Ambulatory Visit (INDEPENDENT_AMBULATORY_CARE_PROVIDER_SITE_OTHER): Payer: Medicare Other | Admitting: Internal Medicine

## 2015-01-19 VITALS — BP 112/78 | HR 96 | Temp 97.4°F | Resp 20 | Ht 67.0 in | Wt 162.2 lb

## 2015-01-19 DIAGNOSIS — I10 Essential (primary) hypertension: Secondary | ICD-10-CM

## 2015-01-19 DIAGNOSIS — IMO0002 Reserved for concepts with insufficient information to code with codable children: Secondary | ICD-10-CM

## 2015-01-19 DIAGNOSIS — E1165 Type 2 diabetes mellitus with hyperglycemia: Secondary | ICD-10-CM | POA: Diagnosis not present

## 2015-01-19 DIAGNOSIS — E785 Hyperlipidemia, unspecified: Secondary | ICD-10-CM

## 2015-01-19 DIAGNOSIS — M25561 Pain in right knee: Secondary | ICD-10-CM | POA: Diagnosis not present

## 2015-01-19 DIAGNOSIS — M25511 Pain in right shoulder: Secondary | ICD-10-CM

## 2015-01-19 DIAGNOSIS — E1140 Type 2 diabetes mellitus with diabetic neuropathy, unspecified: Secondary | ICD-10-CM | POA: Diagnosis not present

## 2015-01-19 NOTE — Progress Notes (Signed)
Patient ID: Micheal [name], male   DOB: [birth year], 81 y.o.   MRN: [id number]    Facility  [place]    Place of Service:   OFFICE    Allergies  Allergen Reactions  . Aspirin Other (See Comments)    Bothers stomach, thins blood   . Atorvastatin     Muscular pain and weakness  . Metformin Hcl Nausea Only  . Prednisone Other (See Comments)    Upset stomach   . Simvastatin     Muscular pain and weakness  . Sulfa Antibiotics Other (See Comments)    Upset stomach   . Voltaren [Diclofenac Sodium]     Chief Complaint  Patient presents with  . Medical Management of Chronic Issues    4 month follow-up for Hypertension, Hyperlipidemia, DM  . OTHER    Labs printed    HPI:  Pain in the right knee and shoulder. Had MRI of the right shoulder. Dr. Veverly Fells said he needed surgery.  DM type 2, uncontrolled, with neuropathy ([place]) - A1c has returned to 7.9  Essential hypertension - controlled  Hyperlipidemia - high LDL. Intolerant to statins.  Knee pain, right - pain in the right knee is getting worse  Right shoulder pain - needs surgery per ortho. Dr. Veverly Fells    Medications: Patient's Medications  New Prescriptions   No medications on file  Previous Medications   ACETAMINOPHEN (TYLENOL) 500 MG TABLET    Take 500 mg by mouth every 6 (six) hours as needed for pain. Take one tablet as needed for pain.   B-D ULTRAFINE III SHORT PEN 31G X 8 MM MISC    USE DAILY WITH ADMINISTRATION OF INSULIN DX E11.40, E11.65   CHOLECALCIFEROL (VITAMIN D) 1000 UNITS TABLET    Take 2,000 Units by mouth daily. Take 2 capsules daily for vitamin D supplement   FAMOTIDINE (PEPCID) 20 MG TABLET    Take 20 mg by mouth as needed. Take one tablet as needed for reflux.   FLUZONE HIGH-DOSE 0.5 ML SUSY    TO BE ADMINISTERED BY PHARMACIST FOR IMMUNIZATION   INSULIN GLARGINE (LANTUS SOLOSTAR) 100 UNIT/ML SOLOSTAR PEN    INJECT 30 UNITS AT BEDTIME FOR DIABETES.   INSULIN ISOPHANE & REGULAR HUMAN (HUMULIN 70/30 KWIKPEN)  (70-30) 100 UNIT/ML PEN    Inject 18 units in the morning  to control diabetes. DX E11.40, E11.65   INSULIN PEN NEEDLE (B-D ULTRAFINE III SHORT PEN) 31G X 8 MM MISC    Use twice daily with administration of insulin and humulin DX E11.40, E11.65   MULTIPLE VITAMIN (MULTIVITAMIN WITH MINERALS) TABS TABLET    Take 1 tablet by mouth daily.   NYSTATIN-TRIAMCINOLONE (MYCOLOG II) CREAM    APPLY TOPICALLY 4 TIMES DAY TO RASH   TAMSULOSIN (FLOMAX) 0.4 MG CAPS CAPSULE    Take one capsule once day for prostate  Modified Medications   No medications on file  Discontinued Medications   LANTUS SOLOSTAR 100 UNIT/ML SOLOSTAR PEN    INJECT 20 UNITS AT BEDTIME FOR DIABETES.    Review of Systems  Constitutional: Positive for fatigue and unexpected weight change (gaing weight; 4 # in 8 mo). Negative for fever, chills, diaphoresis, activity change and appetite change.  HENT: Positive for hearing loss.   Eyes: Positive for visual disturbance.       Severe visual problems related to macular degeneration, retinal hemorrhage,  Respiratory: Positive for shortness of breath (on exertion).   Cardiovascular: Negative.  Negative for chest pain, palpitations  and leg swelling.  Endocrine: Negative for polydipsia, polyphagia and polyuria.       Diabetic. Poor control.  Genitourinary:       Hx prostate cancer. Also may have BPH. Followed by Dr. Risa Grill.  Musculoskeletal: Positive for arthralgias (right knee). Negative for myalgias, back pain, joint swelling, gait problem and neck pain.  Skin: Negative.  Negative for pallor.  Allergic/Immunologic: Negative.   Neurological: Positive for tremors and weakness. Negative for dizziness, seizures, speech difficulty and headaches.  Hematological: Negative.   Psychiatric/Behavioral: Negative for confusion. The patient is nervous/anxious.     Filed Vitals:   01/19/15 1333  BP: 112/78  Pulse: 96  Temp: 97.4 F (36.3 C)  TempSrc: Oral  Resp: 20  Height: 5' 7"  (1.702 m)    Weight: 162 lb 3.2 oz (73.573 kg)  SpO2: 97%   Body mass index is 25.4 kg/(m^2). Filed Weights   01/19/15 1333  Weight: 162 lb 3.2 oz (73.573 kg)     Physical Exam  Constitutional: He is oriented to person, place, and time. He appears well-developed and well-nourished.  HENT:  Head: Normocephalic and atraumatic.  Loss of hearing.  Eyes:  Severe visual losses due to macular degeneration and retinal hemorrhages.  Neck: Normal range of motion. Neck supple. No JVD present. No tracheal deviation present. No thyromegaly present.  Cardiovascular: Normal rate, regular rhythm and normal heart sounds.   Diminished DP and PT bilaterally.  Pulmonary/Chest: Effort normal and breath sounds normal.  Abdominal: Soft. Bowel sounds are normal. He exhibits no mass. There is no guarding.  Musculoskeletal: Normal range of motion. He exhibits no edema or tenderness.  Plan the right knee with movement. Mild crepitance. Very small effusion of the knee.  Lymphadenopathy:    He has no cervical adenopathy.  Neurological: He is alert and oriented to person, place, and time. No cranial nerve deficit. Coordination normal.  Diminished sensation to monofilament testing.  Skin: Skin is warm and dry.  Multiple SK.  Psychiatric: He has a normal mood and affect. His behavior is normal. Thought content normal.    Labs reviewed: Lab Summary Latest Ref Rng 01/14/2015 09/10/2014 05/14/2014 02/18/2014  Hemoglobin 13.0-17.0 g/dL (None) (None) (None) (None)  Hematocrit 39.0-52.0 % (None) (None) (None) (None)  White count - (None) (None) (None) (None)  Platelet count - (None) (None) (None) (None)  Sodium 134 - 144 mmol/L 143 142 144 145(H)  Potassium 3.5 - 5.2 mmol/L 4.3 5.1 5.2 4.3  Calcium 8.6 - 10.2 mg/dL 10.8(H) 11.0(H) 10.7(H) 10.6(H)  Phosphorus - (None) (None) (None) (None)  Creatinine 0.76 - 1.27 mg/dL 0.96 0.98 0.98 0.90  AST 0 - 40 IU/L 18 (None) (None) (None)  Alk Phos 39 - 117 IU/L 105 (None) (None)  (None)  Bilirubin 0.0 - 1.2 mg/dL 0.4 (None) (None) (None)  Glucose 65 - 99 mg/dL 120(H) 127(H) 69 83  Cholesterol - (None) (None) (None) (None)  HDL cholesterol >39 mg/dL 42 42 46 41  Triglycerides 0 - 149 mg/dL 239(H) 202(H) 151(H) 168(H)  LDL Direct - (None) (None) (None) (None)  LDL Calc 0 - 99 mg/dL 187(H) 168(H) 166(H) 163(H)  Total protein - (None) (None) (None) (None)  Albumin 3.5 - 4.7 g/dL 4.5 (None) (None) (None)   No results found for: TSH, T3TOTAL, T4TOTAL, THYROIDAB Lab Results  Component Value Date   BUN 10 01/14/2015   BUN 14 09/10/2014   BUN 11 05/14/2014   Lab Results  Component Value Date   HGBA1C 7.9* 01/14/2015  HGBA1C 8.3* 09/10/2014   HGBA1C 7.8* 05/14/2014    Assessment/Plan  1. DM type 2, uncontrolled, with neuropathy ([place]) - Hemoglobin A1c; Future - Basic metabolic panel; Future - Microalbumin, urine; Future  2. Essential hypertension - Basic metabolic panel; Future  3. Hyperlipidemia Intolerant to statins  4. Knee pain, right Patient to discuss pain with orthopedist  5. Right shoulder pain Followed by ortho

## 2015-02-09 DIAGNOSIS — M1711 Unilateral primary osteoarthritis, right knee: Secondary | ICD-10-CM | POA: Diagnosis not present

## 2015-02-09 DIAGNOSIS — M25511 Pain in right shoulder: Secondary | ICD-10-CM | POA: Diagnosis not present

## 2015-02-14 ENCOUNTER — Telehealth: Payer: Self-pay | Admitting: *Deleted

## 2015-02-14 NOTE — Telephone Encounter (Signed)
Received fax from [facility] Dr. Remo Lipps Norris-for surgery clearance. Having Right Shoulder arthroscopy, SAD, open rotator cuff repair, open biceps tenodesis, open DCR, RCR-open acute/less than 3 months, biceps tenodesis open, Open resection distal clavical.  Wants it faxed back to [facility] [phone] Fax: [phone] Given to Dr. Nyoka Cowden to review and sign. Printed OV notes and attached.

## 2015-02-25 ENCOUNTER — Encounter: Payer: Self-pay | Admitting: Internal Medicine

## 2015-02-25 ENCOUNTER — Encounter (HOSPITAL_COMMUNITY): Payer: Self-pay | Admitting: Emergency Medicine

## 2015-02-25 ENCOUNTER — Ambulatory Visit (INDEPENDENT_AMBULATORY_CARE_PROVIDER_SITE_OTHER): Payer: Medicare Other | Admitting: Internal Medicine

## 2015-02-25 ENCOUNTER — Emergency Department (HOSPITAL_COMMUNITY): Payer: Medicare Other

## 2015-02-25 ENCOUNTER — Emergency Department (HOSPITAL_COMMUNITY)
Admission: EM | Admit: 2015-02-25 | Discharge: 2015-02-25 | Disposition: A | Discharge status: Home / Self Care | Payer: Medicare Other | Attending: Emergency Medicine | Admitting: Emergency Medicine

## 2015-02-25 VITALS — BP 140/70 | HR 100 | Temp 97.8°F | Resp 20

## 2015-02-25 DIAGNOSIS — R0902 Hypoxemia: Secondary | ICD-10-CM | POA: Diagnosis not present

## 2015-02-25 DIAGNOSIS — Z8669 Personal history of other diseases of the nervous system and sense organs: Secondary | ICD-10-CM | POA: Diagnosis not present

## 2015-02-25 DIAGNOSIS — E559 Vitamin D deficiency, unspecified: Secondary | ICD-10-CM | POA: Diagnosis not present

## 2015-02-25 DIAGNOSIS — R000 Tachycardia, unspecified: Secondary | ICD-10-CM

## 2015-02-25 DIAGNOSIS — J209 Acute bronchitis, unspecified: Secondary | ICD-10-CM | POA: Insufficient documentation

## 2015-02-25 DIAGNOSIS — J40 Bronchitis, not specified as acute or chronic: Secondary | ICD-10-CM | POA: Diagnosis not present

## 2015-02-25 DIAGNOSIS — E1165 Type 2 diabetes mellitus with hyperglycemia: Secondary | ICD-10-CM

## 2015-02-25 DIAGNOSIS — R059 Cough, unspecified: Secondary | ICD-10-CM

## 2015-02-25 DIAGNOSIS — R05 Cough: Secondary | ICD-10-CM | POA: Diagnosis not present

## 2015-02-25 DIAGNOSIS — R0602 Shortness of breath: Secondary | ICD-10-CM | POA: Diagnosis not present

## 2015-02-25 DIAGNOSIS — N401 Enlarged prostate with lower urinary tract symptoms: Secondary | ICD-10-CM | POA: Diagnosis not present

## 2015-02-25 DIAGNOSIS — I10 Essential (primary) hypertension: Secondary | ICD-10-CM | POA: Diagnosis not present

## 2015-02-25 DIAGNOSIS — Z794 Long term (current) use of insulin: Secondary | ICD-10-CM | POA: Diagnosis not present

## 2015-02-25 DIAGNOSIS — R062 Wheezing: Secondary | ICD-10-CM | POA: Diagnosis not present

## 2015-02-25 DIAGNOSIS — IMO0002 Reserved for concepts with insufficient information to code with codable children: Secondary | ICD-10-CM

## 2015-02-25 DIAGNOSIS — Z79899 Other long term (current) drug therapy: Secondary | ICD-10-CM | POA: Insufficient documentation

## 2015-02-25 DIAGNOSIS — K219 Gastro-esophageal reflux disease without esophagitis: Secondary | ICD-10-CM | POA: Insufficient documentation

## 2015-02-25 DIAGNOSIS — Z8546 Personal history of malignant neoplasm of prostate: Secondary | ICD-10-CM | POA: Diagnosis not present

## 2015-02-25 DIAGNOSIS — E1140 Type 2 diabetes mellitus with diabetic neuropathy, unspecified: Secondary | ICD-10-CM

## 2015-02-25 DIAGNOSIS — E119 Type 2 diabetes mellitus without complications: Secondary | ICD-10-CM | POA: Diagnosis not present

## 2015-02-25 DIAGNOSIS — Z8739 Personal history of other diseases of the musculoskeletal system and connective tissue: Secondary | ICD-10-CM | POA: Insufficient documentation

## 2015-02-25 LAB — COMPREHENSIVE METABOLIC PANEL
ALT: 22 U/L (ref 17–63)
AST: 24 U/L (ref 15–41)
Albumin: 4.4 g/dL (ref 3.5–5.0)
Alkaline Phosphatase: 98 U/L (ref 38–126)
Anion gap: 11 (ref 5–15)
BUN: 14 mg/dL (ref 6–20)
CO2: 24 mmol/L (ref 22–32)
Calcium: 11.1 mg/dL — ABNORMAL HIGH (ref 8.9–10.3)
Chloride: 103 mmol/L (ref 101–111)
Creatinine, Ser: 1.05 mg/dL (ref 0.61–1.24)
GFR calc Af Amer: >60 mL/min (ref >60)
GFR calc non Af Amer: >60 mL/min (ref >60)
Glucose, Bld: 125 mg/dL — ABNORMAL HIGH (ref 65–99)
Potassium: 4.3 mmol/L (ref 3.5–5.1)
Sodium: 138 mmol/L (ref 135–145)
Total Bilirubin: 0.4 mg/dL (ref 0.3–1.2)
Total Protein: 6.7 g/dL (ref 6.5–8.1)

## 2015-02-25 LAB — CBC WITH DIFFERENTIAL/PLATELET
Basophils Absolute: 0 10*3/uL (ref 0.0–0.1)
Basophils Relative: 0 %
Eosinophils Absolute: 0.1 10*3/uL (ref 0.0–0.7)
Eosinophils Relative: 2 %
HCT: 44.8 % (ref 39.0–52.0)
Hemoglobin: 15.3 g/dL (ref 13.0–17.0)
Lymphocytes Relative: 12 %
Lymphs Abs: 1.1 10*3/uL (ref 0.7–4.0)
MCH: 30.8 pg (ref 26.0–34.0)
MCHC: 34.2 g/dL (ref 30.0–36.0)
MCV: 90.1 fL (ref 78.0–100.0)
Monocytes Absolute: 1.6 10*3/uL — ABNORMAL HIGH (ref 0.1–1.0)
Monocytes Relative: 17 %
Neutro Abs: 6.2 10*3/uL (ref 1.7–7.7)
Neutrophils Relative %: 69 %
Platelets: 175 10*3/uL (ref 150–400)
RBC: 4.97 MIL/uL (ref 4.22–5.81)
RDW: 12.9 % (ref 11.5–15.5)
WBC: 9 10*3/uL (ref 4.0–10.5)

## 2015-02-25 LAB — URINALYSIS, ROUTINE W REFLEX MICROSCOPIC
Bilirubin Urine: NEGATIVE
Glucose, UA: NEGATIVE mg/dL
Hgb urine dipstick: NEGATIVE
Ketones, ur: NEGATIVE mg/dL
Leukocytes, UA: NEGATIVE
Nitrite: NEGATIVE
Protein, ur: NEGATIVE mg/dL
Specific Gravity, Urine: 1.016 (ref 1.005–1.030)
pH: 5.5 (ref 5.0–8.0)

## 2015-02-25 LAB — I-STAT CG4 LACTIC ACID, ED
Lactic Acid, Venous: 1.31 mmol/L (ref 0.5–2.0)
Lactic Acid, Venous: 0.61 mmol/L (ref 0.5–2.0)

## 2015-02-25 MED ORDER — HYDROCODONE-HOMATROPINE 5-1.5 MG/5ML PO SYRP
5.0000 mL | ORAL_SOLUTION | Freq: Once | ORAL | Status: AC
  Administered 2015-02-25: 5 mL via ORAL
  Filled 2015-02-25: qty 5

## 2015-02-25 MED ORDER — AZITHROMYCIN 250 MG PO TABS
500.0000 mg | ORAL_TABLET | Freq: Once | ORAL | Status: AC
  Administered 2015-02-25: 500 mg via ORAL
  Filled 2015-02-25: qty 2

## 2015-02-25 MED ORDER — HYDROCODONE-HOMATROPINE 5-1.5 MG/5ML PO SYRP: 5.0000 mL | ORAL_SOLUTION | Freq: Four times a day (QID) | ORAL | Status: DC | PRN

## 2015-02-25 MED ORDER — IPRATROPIUM-ALBUTEROL 0.5-2.5 (3) MG/3ML IN SOLN
3.0000 mL | Freq: Once | RESPIRATORY_TRACT | Status: AC
  Administered 2015-02-25: 3 mL via RESPIRATORY_TRACT
  Filled 2015-02-25: qty 3

## 2015-02-25 MED ORDER — AZITHROMYCIN 250 MG PO TABS: 250.0000 mg | ORAL_TABLET | Freq: Every day | ORAL | Status: DC

## 2015-02-25 MED ORDER — ALBUTEROL SULFATE HFA 108 (90 BASE) MCG/ACT IN AERS
2.0000 | INHALATION_SPRAY | Freq: Once | RESPIRATORY_TRACT | Status: AC
  Administered 2015-02-25: 2 via RESPIRATORY_TRACT
  Filled 2015-02-25: qty 6.7

## 2015-02-25 NOTE — Patient Instructions (Addendum)
Send to the ER for further evaluation and management. ED notified  Follow up after discharge

## 2015-02-25 NOTE — Progress Notes (Signed)
Patient ID: Micheal [name], male   DOB: [birth year], 82 y.o.   MRN: [id number]    Location:    [facility]   Place of Service:  OFFICE   Chief Complaint  Patient presents with  . Acute Visit    cough-chest pain,runny nose, disoriented, muscle pain    HPI:  82 yo male seen today for URI sx's x few days but suddenly worsened last night. He reports feeling disoriented and unsteady gait. (+) harsh cough, HA, dizziness, SOB, chills, reduced appetite, sleep disturbance, clear rhinorrhea, left sided CP with chest tightness, myalgias and arthralgias. Denies fever, nausea, post nasal drip, sore throat, ear pain. He spent several days at the hospital this past week when his wife was admitted with TIA. She was d/c'd on yesterday. Nothing tried OTC. He has a hx DM and allergic rhinitis. A1c 7.9% and his BS at home this AM was "decent"  He does not drive due to legally blind 2/2 macular degeneration. A neighbor brought him to his appt today  Past Medical History  Diagnosis Date  . Malignant neoplasm of prostate ([place])   . Type II or unspecified type diabetes mellitus without mention of complication, not stated as uncontrolled   . Type II or unspecified type diabetes mellitus without mention of complication, uncontrolled   . Vitamin D deficiency   . Other and unspecified hyperlipidemia   . Restless legs syndrome (RLS)   . Carpal tunnel syndrome   . Retinal detachment with retinal defect, unspecified   . Macular degeneration (senile) of retina, unspecified   . Unspecified essential hypertension   . Right bundle branch block     Incomplete  . Paroxysmal supraventricular tachycardia ([place])   . Internal hemorrhoids without mention of complication   . Orthostatic hypotension   . Allergic rhinitis, cause unspecified   . Esophageal reflux   . Diverticulosis of colon (without mention of hemorrhage)   . Irritable bowel syndrome   . Other specified disorder of rectum and anus     Radiation Proctitus  .  Neurogenic bladder, NOS   . Hypertrophy of prostate with urinary obstruction and other lower urinary tract symptoms (LUTS)   . Spermatocele     Right  . Osteoarthrosis, unspecified whether generalized or localized, unspecified site   . Cervical spondylosis without myelopathy   . Cervicalgia   . Cervical spondylosis without myelopathy   . Trigger finger (acquired)   . Insomnia, unspecified   . Other malaise and fatigue   . Nonspecific (abnormal) findings on radiological and other examination of gastrointestinal tract     Past Surgical History  Procedure Laterality Date  . Appendectomy  1951  . Polypectomy  1955    Vocal cord  . Angiolipoma  1980    Right arm  . Knee surgery  1979  . Dupuytren contracture release Bilateral 1989  . Transurethral resection of prostate  08/1989  . Cholecystectomy  01/1994  . Eye surgery  2006    Left  . Eye surgery  2006    Retina reattachment, right  . Eye surgery  02/1994    Cataract surgery, right  . Eye surgery  01/1995    Cataract surgery, left  . Tendon release  01/1997    Fourth finger, left  . Retinal detachment surgery Right 2005  . Colonoscopy w/ polypectomy  2005    Removed 2 (two) polyps  . Colonoscopy  04/11/2010  . Vitrectomy Left 04/23/2013    [facility]    Patient  Care Team: Estill Dooms, MD as PCP - General (Internal Medicine) Netta Cedars, MD as Consulting Physician (Orthopedic Surgery) Jasmine Awe, MD as Referring Physician (Ophthalmology) Rana Snare, MD as Consulting Physician (Urology)  Social History   Social History  . Marital Status: Married    Spouse Name: N/A  . Number of Children: N/A  . Years of Education: N/A   Occupational History  . Not on file.   Social History Main Topics  . Smoking status: Never Smoker   . Smokeless tobacco: Never Used  . Alcohol Use: No  . Drug Use: No  . Sexual Activity: No   Other Topics Concern  . Not on file   Social History Narrative     reports  that he has never smoked. He has never used smokeless tobacco. He reports that he does not drink alcohol or use illicit drugs.  Allergies  Allergen Reactions  . Aspirin Other (See Comments)    Bothers stomach, thins blood   . Atorvastatin     Muscular pain and weakness  . Metformin Hcl Nausea Only  . Prednisone Other (See Comments)    Upset stomach   . Simvastatin     Muscular pain and weakness  . Sulfa Antibiotics Other (See Comments)    Upset stomach   . Voltaren [Diclofenac Sodium]     Medications: Patient's Medications  New Prescriptions   No medications on file  Previous Medications   ACETAMINOPHEN (TYLENOL) 500 MG TABLET    Take 500 mg by mouth every 6 (six) hours as needed for pain. Take one tablet as needed for pain.   B-D ULTRAFINE III SHORT PEN 31G X 8 MM MISC    USE DAILY WITH ADMINISTRATION OF INSULIN DX E11.40, E11.65   CHOLECALCIFEROL (VITAMIN D) 1000 UNITS TABLET    Take 2,000 Units by mouth daily. Take 2 capsules daily for vitamin D supplement   FAMOTIDINE (PEPCID) 20 MG TABLET    Take 20 mg by mouth as needed. Take one tablet as needed for reflux.   FLUZONE HIGH-DOSE 0.5 ML SUSY    TO BE ADMINISTERED BY PHARMACIST FOR IMMUNIZATION   INSULIN GLARGINE (LANTUS SOLOSTAR) 100 UNIT/ML SOLOSTAR PEN    INJECT 30 UNITS AT BEDTIME FOR DIABETES.   INSULIN ISOPHANE & REGULAR HUMAN (HUMULIN 70/30 KWIKPEN) (70-30) 100 UNIT/ML PEN    Inject 18 units in the morning  to control diabetes. DX E11.40, E11.65   INSULIN PEN NEEDLE (B-D ULTRAFINE III SHORT PEN) 31G X 8 MM MISC    Use twice daily with administration of insulin and humulin DX E11.40, E11.65   MULTIPLE VITAMIN (MULTIVITAMIN WITH MINERALS) TABS TABLET    Take 1 tablet by mouth daily.   NYSTATIN-TRIAMCINOLONE (MYCOLOG II) CREAM    APPLY TOPICALLY 4 TIMES DAY TO RASH   TAMSULOSIN (FLOMAX) 0.4 MG CAPS CAPSULE    Take one capsule once day for prostate  Modified Medications   No medications on file  Discontinued Medications    No medications on file    Review of Systems  Constitutional: Positive for chills and fatigue. Negative for fever.  HENT: Positive for congestion, postnasal drip and rhinorrhea (clear). Negative for ear pain, sneezing and sore throat.   Respiratory: Positive for cough, chest tightness and shortness of breath.   Cardiovascular: Positive for chest pain.  Neurological: Positive for dizziness, weakness and headaches.    Filed Vitals:   02/25/15 1525  BP: 140/70  Pulse: 100  Temp: 97.8 F (36.6  C)  TempSrc: Oral  Resp: 20  SpO2: 90%   There is no weight on file to calculate BMI.  Physical Exam  Constitutional: He appears well-developed and well-nourished.  Looks ill with conversational dyspnea. No accessory muscle use  HENT:  Mouth/Throat: Oropharynx is clear and moist. No oropharyngeal exudate.  TM intact b/l and appear nml. Nares patent. No sinsu TTP but has maxillary sinus boggy tissue texture changes  Eyes: Pupils are equal, round, and reactive to light. Right eye exhibits no discharge. Left eye exhibits no discharge. No scleral icterus.  Neck: Neck supple. No tracheal deviation present.  Cardiovascular: Intact distal pulses and normal pulses.  Tachycardia present.  Exam reveals no gallop, no distant heart sounds and no friction rub.   Murmur heard.  Systolic murmur is present with a grade of 1/6  No LE edema b/l. No calf TTP  Pulmonary/Chest: No stridor. He is in respiratory distress. He has wheezes (b/l end expiratory wheezing). He has no rales.  Prolonged expiratory phase  Abdominal: Soft. Bowel sounds are normal. He exhibits no distension. There is tenderness (epigastric). There is no rebound and no guarding.  Lymphadenopathy:    He has no cervical adenopathy.  Neurological: He is alert.  Gait unsteady  Skin: Skin is warm and dry. No rash noted.  Psychiatric: He has a normal mood and affect. His behavior is normal.     Labs reviewed: Appointment on 01/14/2015    Component Date Value Ref Range Status  . Hgb A1c MFr Bld 01/14/2015 7.9* 4.8 - 5.6 % Final   Comment:          Pre-diabetes: 5.7 - 6.4          Diabetes: >6.4          Glycemic control for adults with diabetes: <7.0   . Est. average glucose Bld gHb Est-m* 01/14/2015 180   Final  . Glucose 01/14/2015 120* 65 - 99 mg/dL Final  . BUN 01/14/2015 10  8 - 27 mg/dL Final  . Creatinine, Ser 01/14/2015 0.96  0.76 - 1.27 mg/dL Final  . GFR calc non Af Amer 01/14/2015 72  >59 mL/min/1.73 Final  . GFR calc Af Amer 01/14/2015 84  >59 mL/min/1.73 Final  . BUN/Creatinine Ratio 01/14/2015 10  10 - 22 Final  . Sodium 01/14/2015 143  134 - 144 mmol/L Final  . Potassium 01/14/2015 4.3  3.5 - 5.2 mmol/L Final  . Chloride 01/14/2015 104  96 - 106 mmol/L Final  . CO2 01/14/2015 24  18 - 29 mmol/L Final  . Calcium 01/14/2015 10.8* 8.6 - 10.2 mg/dL Final  . Total Protein 01/14/2015 6.3  6.0 - 8.5 g/dL Final  . Albumin 01/14/2015 4.5  3.5 - 4.7 g/dL Final  . Globulin, Total 01/14/2015 1.8  1.5 - 4.5 g/dL Final  . Albumin/Globulin Ratio 01/14/2015 2.5  1.1 - 2.5 Final  . Bilirubin Total 01/14/2015 0.4  0.0 - 1.2 mg/dL Final  . Alkaline Phosphatase 01/14/2015 105  39 - 117 IU/L Final  . AST 01/14/2015 18  0 - 40 IU/L Final  . ALT 01/14/2015 18  0 - 44 IU/L Final  . Cholesterol, Total 01/14/2015 277* 100 - 199 mg/dL Final  . Triglycerides 01/14/2015 239* 0 - 149 mg/dL Final  . HDL 01/14/2015 42  >39 mg/dL Final  . VLDL Cholesterol Cal 01/14/2015 48* 5 - 40 mg/dL Final  . LDL Calculated 01/14/2015 187* 0 - 99 mg/dL Final  . Chol/HDL Ratio 01/14/2015 6.6* 0.0 -  5.0 ratio units Final   Comment:                                   T. Chol/HDL Ratio                                             Men  Women                               1/2 Avg.Risk  3.4    3.3                                   Avg.Risk  5.0    4.4                                2X Avg.Risk  9.6    7.1                                3X Avg.Risk 23.4    11.0     No results found.   Assessment/Plan   ICD-9-CM ICD-10-CM   1. Shortness of breath r/o pneumonia vs influenza  786.05 R06.02   2. Cough - probably due to #1,5,4 786.2 R05   3. Tachycardia - related to #1, 2 785.0 R00.0   4. Hypoxia - related to #1,2 799.02 R09.02   5. Wheezing - related to #1,2 786.07 R06.2   6. DM type 2, uncontrolled, with neuropathy ([place]) 250.62 E11.40    357.2 E11.65     O2 sats on RA after ambulating 90% but improved to 96% with rest  Pt sent to the ER by private vehicle for further eval. S/w ED charge RN [place].   F/u after discharge  Alianys Chacko S. Perlie Gold  [facility] [address] [place], [place] [address] [phone] Cell (Monday-Friday 8 AM - 5 PM) [phone] After 5 PM and follow prompts

## 2015-02-25 NOTE — ED Notes (Signed)
Pt left with all his belongings and was wheeled out of the treatment area.  

## 2015-02-25 NOTE — ED Provider Notes (Signed)
CSN: [id number]     Arrival date & time 02/25/15  1628 History   First MD Initiated Contact with Patient 02/25/15 2003     Chief Complaint  Patient presents with  . Fever  . Cough     (Consider location/radiation/quality/duration/timing/severity/associated sxs/prior Treatment) HPI Patient has had about a week of upper respiratory symptoms with nasal congestion, drainage and minor cough. He denies he's had any fever. He denies generalized bodyaches. He states that he has had a cough that is worsened since yesterday. He reports she's felt short of breath with coughing episodes. He has not had associated nausea vomiting or diarrhea. Patient reports that he has had his flu shot and his pneumonia shot. He states he's had the flu in the past and this doesn't feel like the flu because he doesn't have the achiness and he hasn't had fevers. Past Medical History  Diagnosis Date  . Malignant neoplasm of prostate ([place])   . Type II or unspecified type diabetes mellitus without mention of complication, not stated as uncontrolled   . Type II or unspecified type diabetes mellitus without mention of complication, uncontrolled   . Vitamin D deficiency   . Other and unspecified hyperlipidemia   . Restless legs syndrome (RLS)   . Carpal tunnel syndrome   . Retinal detachment with retinal defect, unspecified   . Macular degeneration (senile) of retina, unspecified   . Unspecified essential hypertension   . Right bundle branch block     Incomplete  . Paroxysmal supraventricular tachycardia ([place])   . Internal hemorrhoids without mention of complication   . Orthostatic hypotension   . Allergic rhinitis, cause unspecified   . Esophageal reflux   . Diverticulosis of colon (without mention of hemorrhage)   . Irritable bowel syndrome   . Other specified disorder of rectum and anus     Radiation Proctitus  . Neurogenic bladder, NOS   . Hypertrophy of prostate with urinary obstruction and other lower urinary  tract symptoms (LUTS)   . Spermatocele     Right  . Osteoarthrosis, unspecified whether generalized or localized, unspecified site   . Cervical spondylosis without myelopathy   . Cervicalgia   . Cervical spondylosis without myelopathy   . Trigger finger (acquired)   . Insomnia, unspecified   . Other malaise and fatigue   . Nonspecific (abnormal) findings on radiological and other examination of gastrointestinal tract    Past Surgical History  Procedure Laterality Date  . Appendectomy  1951  . Polypectomy  1955    Vocal cord  . Angiolipoma  1980    Right arm  . Knee surgery  1979  . Dupuytren contracture release Bilateral 1989  . Transurethral resection of prostate  08/1989  . Cholecystectomy  01/1994  . Eye surgery  2006    Left  . Eye surgery  2006    Retina reattachment, right  . Eye surgery  02/1994    Cataract surgery, right  . Eye surgery  01/1995    Cataract surgery, left  . Tendon release  01/1997    Fourth finger, left  . Retinal detachment surgery Right 2005  . Colonoscopy w/ polypectomy  2005    Removed 2 (two) polyps  . Colonoscopy  04/11/2010  . Vitrectomy Left 04/23/2013    [facility]   Family History  Problem Relation Age of Onset  . Depression Mother   . Diabetes Mother   . Mental illness Mother     OCD  . Hypertension Sister   .  Diabetes Daughter   . Hyperlipidemia Daughter    Social History  Substance Use Topics  . Smoking status: Never Smoker   . Smokeless tobacco: Never Used  . Alcohol Use: No    Review of Systems 10 Systems reviewed and are negative for acute change except as noted in the HPI.    Allergies  Aspirin; Atorvastatin; Prednisone; Simvastatin; Sulfa antibiotics; Metformin hcl; and Voltaren  Home Medications   Prior to Admission medications   Medication Sig Start Date End Date Taking? Authorizing Provider  acetaminophen (TYLENOL) 500 MG tablet Take 500 mg by mouth every 6 (six) hours as needed for pain. Take one  tablet as needed for pain.   Yes Historical Provider, MD  cholecalciferol (VITAMIN D) 1000 UNITS tablet Take 2,000 Units by mouth daily. Take 2 capsules daily for vitamin D supplement   Yes Historical Provider, MD  famotidine (PEPCID) 20 MG tablet Take 20 mg by mouth as needed. Take one tablet as needed for reflux.   Yes Historical Provider, MD  Insulin Glargine (LANTUS SOLOSTAR) 100 UNIT/ML Solostar Pen INJECT 30 UNITS AT BEDTIME FOR DIABETES. Patient taking differently: Inject 30-45 Units into the skin daily at 10 pm. Normally 30 units, but can take up to 45 units, sliding scale 09/14/14  Yes Estill Dooms, MD  Insulin Isophane & Regular Human (HUMULIN 70/30 KWIKPEN) (70-30) 100 UNIT/ML PEN Inject 18 units in the morning  to control diabetes. DX E11.40, E11.65 09/14/14  Yes Estill Dooms, MD  Multiple Vitamin (MULTIVITAMIN WITH MINERALS) TABS tablet Take 1 tablet by mouth daily.   Yes Historical Provider, MD  nystatin-triamcinolone (MYCOLOG II) cream APPLY TOPICALLY 4 TIMES DAY TO RASH 08/02/14  Yes Estill Dooms, MD  tamsulosin [facility]) 0.4 MG CAPS capsule Take one capsule once day for prostate 05/10/14  Yes Estill Dooms, MD  azithromycin (ZITHROMAX) 250 MG tablet Take 1 tablet (250 mg total) by mouth daily. 02/25/15   Charlesetta Shanks, MD  B-D ULTRAFINE III SHORT PEN 31G X 8 MM MISC USE DAILY WITH ADMINISTRATION OF INSULIN DX E11.40, E11.65 11/29/14   Estill Dooms, MD  HYDROcodone-homatropine [facility]) 5-1.5 MG/5ML syrup Take 5 mLs by mouth every 6 (six) hours as needed for cough. 02/25/15   Charlesetta Shanks, MD  Insulin Pen Needle (B-D ULTRAFINE III SHORT PEN) 31G X 8 MM MISC Use twice daily with administration of insulin and humulin DX E11.40, E11.65 05/18/14   Estill Dooms, MD   BP 119/66 mmHg  Pulse 109  Temp(Src) 99.3 F (37.4 C) (Oral)  Resp 16  Ht 5\' 8"  (1.727 m)  Wt 160 lb (72.576 kg)  BMI 24.33 kg/m2  SpO2 88% Physical Exam  Constitutional: He is oriented to person, place, and time.  He appears well-developed and well-nourished.  HENT:  Head: Normocephalic and atraumatic.  Eyes: EOM are normal. Pupils are equal, round, and reactive to light.  Neck: Neck supple.  Cardiovascular: Normal rate, regular rhythm, normal heart sounds and intact distal pulses.   Pulmonary/Chest: Effort normal.  The patient has rhonchi at the bases of both lungs with expiratory wheeze. He develops harsh cough paroxysmal with deep inspiration.  Abdominal: Soft. Bowel sounds are normal. He exhibits no distension. There is no tenderness.  Musculoskeletal: Normal range of motion. He exhibits no edema or tenderness.  Neurological: He is alert and oriented to person, place, and time. He has normal strength. Coordination normal. GCS eye subscore is 4. GCS verbal subscore is 5. GCS motor subscore  is 6.  Skin: Skin is warm, dry and intact.  Psychiatric: He has a normal mood and affect.    ED Course  Procedures (including critical care time) Labs Review Labs Reviewed  COMPREHENSIVE METABOLIC PANEL - Abnormal; Notable for the following:    Glucose, Bld 125 (*)    Calcium 11.1 (*)    All other components within normal limits  CBC WITH DIFFERENTIAL/PLATELET - Abnormal; Notable for the following:    Monocytes Absolute 1.6 (*)    All other components within normal limits  CULTURE, BLOOD (ROUTINE X 2)  CULTURE, BLOOD (ROUTINE X 2)  URINE CULTURE  URINALYSIS, ROUTINE W REFLEX MICROSCOPIC (NOT AT [facility])  I-STAT CG4 LACTIC ACID, ED  I-STAT CG4 LACTIC ACID, ED    Imaging Review Dg Chest 2 View  02/25/2015  CLINICAL DATA:  Cough and body aches. EXAM: CHEST  2 VIEW COMPARISON:  01/04/2014 FINDINGS: Both lungs are clear. Heart and mediastinum are within normal limits. Trachea is midline. No significant pleural effusions. No acute bone abnormality. IMPRESSION: No active cardiopulmonary disease. Electronically Signed   By: Markus Daft M.D.   On: 02/25/2015 17:47   I have personally reviewed and evaluated these  images and lab results as part of my medical decision-making.   EKG Interpretation None      MDM   Final diagnoses:  Bronchitis   Patient initially had URI symptoms that have now developed into a harsh cough paroxysmal is with wheeze and rhonchi. Patient is nontoxic and alert. He does have cough and wheeze. He responded very well to [employer] and Hycodan. Repeat auscultation showed improved aeration of the bases with rhonchi that clear with cough and mild residual wheeze. At this time I feel he is safe to initiate home treatment with Zithromax, albuterol and Hycodan. Patient reports he has not had fever and does not endorse myalgias to suggest influenza. At this time instructions are given to return should there be any worsening or changing of symptoms otherwise patient's is to follow-up with family physician at the beginning of the week.    Charlesetta Shanks, MD 02/25/15 2233

## 2015-02-25 NOTE — ED Notes (Signed)
Pt from home for eval of cough and body aches that started last night, pt states went to PCP and was sent her for further eval. Denies any cp/sob/n/v/d at this time. Pt states "I just feel crappy." alert and oriented in triage.

## 2015-02-25 NOTE — Discharge Instructions (Signed)
Community-Acquired Pneumonia\bronchitis Pneumonia and bronchitis are infections of the lungs. There are different types of pneumonia. One type can develop while a person is in a hospital. A different type, called community-acquired pneumonia, develops in people who are not, or have not recently been, in the hospital or other health care facility.  CAUSES Pneumonia may be caused by bacteria, viruses, or funguses. Community-acquired pneumonia is often caused by Streptococcus pneumonia bacteria. These bacteria are often passed from one person to another by breathing in droplets from the cough or sneeze of an infected person. RISK FACTORS The condition is more likely to develop in:  People who havechronic diseases, such as chronic obstructive pulmonary disease (COPD), asthma, congestive heart failure, cystic fibrosis, diabetes, or kidney disease.  People who haveearly-stage or late-stage HIV.  People who havesickle cell disease.  People who havehad their spleen removed (splenectomy).  People who havepoor Human resources officer.  People who havemedical conditions that increase the risk of breathing in (aspirating) secretions their own mouth and nose.   People who havea weakened immune system (immunocompromised).  People who smoke.  People whotravel to areas where pneumonia-causing germs commonly exist.  People whoare around animal habitats or animals that have pneumonia-causing germs, including birds, bats, rabbits, cats, and farm animals. SYMPTOMS Symptoms of this condition include:  Adry cough.  A wet (productive) cough.  Fever.  Sweating.  Chest pain, especially when breathing deeply or coughing.  Rapid breathing or difficulty breathing.  Shortness of breath.  Shaking chills.  Fatigue.  Muscle aches. DIAGNOSIS Your health care provider will take a medical history and perform a physical exam. You may also have other tests, including:  Imaging studies of your chest,  including X-rays.  Tests to check your blood oxygen level and other blood gases.  Other tests on blood, mucus (sputum), fluid around your lungs (pleural fluid), and urine. If your pneumonia is severe, other tests may be done to identify the specific cause of your illness. TREATMENT The type of treatment that you receive depends on many factors, such as the cause of your pneumonia, the medicines you take, and other medical conditions that you have. For most adults, treatment and recovery from pneumonia may occur at home. In some cases, treatment must happen in a hospital. Treatment may include:  Antibiotic medicines, if the pneumonia was caused by bacteria.  Antiviral medicines, if the pneumonia was caused by a virus.  Medicines that are given by mouth or through an IV tube.  Oxygen.  Respiratory therapy. Although rare, treating severe pneumonia may include:  Mechanical ventilation. This is done if you are not breathing well on your own and you cannot maintain a safe blood oxygen level.  Thoracentesis. This procedureremoves fluid around one lung or both lungs to help you breathe better. HOME CARE INSTRUCTIONS  Take over-the-counter and prescription medicines only as told by your health care provider.  Only takecough medicine if you are losing sleep. Understand that cough medicine can prevent your body's natural ability to remove mucus from your lungs.  If you were prescribed an antibiotic medicine, take it as told by your health care provider. Do not stop taking the antibiotic even if you start to feel better.  Sleep in a semi-upright position at night. Try sleeping in a reclining chair, or place a few pillows under your head.  Do not use tobacco products, including cigarettes, chewing tobacco, and e-cigarettes. If you need help quitting, ask your health care provider.  Drink enough water to keep your urine  clear or pale yellow. This will help to thin out mucus secretions in your  lungs. °PREVENTION °There are ways that you can decrease your risk of developing community-acquired pneumonia. Consider getting a pneumococcal vaccine if: °· You are older than 80 years of age. °· You are older than 80 years of age and are undergoing cancer treatment, have chronic lung disease, or have other medical conditions that affect your immune system. Ask your health care provider if this applies to you. °There are different types and schedules of pneumococcal vaccines. Ask your health care provider which vaccination option is best for you. °You may also prevent community-acquired pneumonia if you take these actions: °· Get an influenza vaccine every year. Ask your health care provider which type of influenza vaccine is best for you. °· Go to the dentist on a regular basis. °· Wash your hands often. Use hand sanitizer if soap and water are not available. °SEEK MEDICAL CARE IF: °· You have a fever. °· You are losing sleep because you cannot control your cough with cough medicine. °SEEK IMMEDIATE MEDICAL CARE IF: °· You have worsening shortness of breath. °· You have increased chest pain. °· Your sickness becomes worse, especially if you are an older adult or have a weakened immune system. °· You cough up blood. °  °This information is not intended to replace advice given to you by your health care provider. Make sure you discuss any questions you have with your health care provider. °  °Document Released: 01/08/2005 Document Revised: 09/29/2014 Document Reviewed: 05/05/2014 °Elsevier Interactive Patient Education ©2016 Elsevier Inc. ° °

## 2015-02-27 LAB — URINE CULTURE: Culture: 5000

## 2015-03-01 ENCOUNTER — Ambulatory Visit (INDEPENDENT_AMBULATORY_CARE_PROVIDER_SITE_OTHER): Payer: Medicare Other | Admitting: Internal Medicine

## 2015-03-01 ENCOUNTER — Encounter: Payer: Self-pay | Admitting: Internal Medicine

## 2015-03-01 VITALS — BP 130/72 | HR 98 | Temp 97.9°F | Resp 20 | Ht 68.0 in | Wt 155.2 lb

## 2015-03-01 DIAGNOSIS — J209 Acute bronchitis, unspecified: Secondary | ICD-10-CM | POA: Diagnosis not present

## 2015-03-01 NOTE — Progress Notes (Signed)
Patient ID: Micheal [name], male   DOB: 05-Sep-1928, 82 y.o.   MRN: [id number]    Facility  [place]    Place of Service:   OFFICE    Allergies  Allergen Reactions  . Aspirin Other (See Comments)    Bothers stomach, thins blood   . Atorvastatin Other (See Comments)    Muscular pain and weakness  . Prednisone Other (See Comments)    Upset stomach   . Simvastatin Other (See Comments)    Muscular pain and weakness  . Sulfa Antibiotics Other (See Comments)    Upset stomach   . Metformin Hcl Nausea Only  . Voltaren [Diclofenac Sodium] Other (See Comments)    Chief Complaint  Patient presents with  . Hospitalization Follow-up    f/u bronchitis    HPI:  Seen in ER 02/25/15 after office visit. CXR normal. He thinks he is beginning to recover now.  Has surgery scheduled at the end of Feb 2017 by Dr. Veverly Fells on his shoulder.  Medications: Patient's Medications  New Prescriptions   No medications on file  Previous Medications   ACETAMINOPHEN (TYLENOL) 500 MG TABLET    Take 500 mg by mouth every 6 (six) hours as needed for pain. Take one tablet as needed for pain.   ALBUTEROL (PROVENTIL HFA;VENTOLIN HFA) 108 (90 BASE) MCG/ACT INHALER    Inhale 2 puffs into the lungs 2 (two) times daily.   AZITHROMYCIN (ZITHROMAX) 250 MG TABLET       B-D ULTRAFINE III SHORT PEN 31G X 8 MM MISC    USE DAILY WITH ADMINISTRATION OF INSULIN DX E11.40, E11.65   CHOLECALCIFEROL (VITAMIN D) 1000 UNITS TABLET    Take 2,000 Units by mouth daily. Take 2 capsules daily for vitamin D supplement   FAMOTIDINE (PEPCID) 20 MG TABLET    Take 20 mg by mouth as needed. Take one tablet as needed for reflux.   HYDROCODONE-HOMATROPINE (HYCODAN) 5-1.5 MG/5ML SYRUP    Take 5 mLs by mouth every 6 (six) hours as needed for cough.   INSULIN GLARGINE (LANTUS SOLOSTAR) 100 UNIT/ML SOLOSTAR PEN    INJECT 30 UNITS AT BEDTIME FOR DIABETES.   INSULIN ISOPHANE & REGULAR HUMAN (HUMULIN 70/30 KWIKPEN) (70-30) 100 UNIT/ML PEN    Inject 18  units in the morning  to control diabetes. DX E11.40, E11.65   INSULIN PEN NEEDLE (B-D ULTRAFINE III SHORT PEN) 31G X 8 MM MISC    Use twice daily with administration of insulin and humulin DX E11.40, E11.65   MULTIPLE VITAMIN (MULTIVITAMIN WITH MINERALS) TABS TABLET    Take 1 tablet by mouth daily.   NYSTATIN-TRIAMCINOLONE (MYCOLOG II) CREAM    APPLY TOPICALLY 4 TIMES DAY TO RASH   TAMSULOSIN (FLOMAX) 0.4 MG CAPS CAPSULE    Take one capsule once day for prostate  Modified Medications   No medications on file  Discontinued Medications   AZITHROMYCIN (ZITHROMAX) 250 MG TABLET    Take 1 tablet (250 mg total) by mouth daily.    Review of Systems  Constitutional: Positive for fatigue and unexpected weight change (gaing weight; 4 # in 8 mo). Negative for fever, chills, diaphoresis, activity change and appetite change.  HENT: Positive for hearing loss.   Eyes: Positive for visual disturbance.       Severe visual problems related to macular degeneration, retinal hemorrhage,  Respiratory: Positive for shortness of breath (on exertion).   Cardiovascular: Negative.  Negative for chest pain, palpitations and leg swelling.  Endocrine: Negative for  polydipsia, polyphagia and polyuria.       Diabetic. Poor control.  Genitourinary:       Hx prostate cancer. Also may have BPH. Followed by Dr. Risa Grill.  Musculoskeletal: Positive for arthralgias (right knee). Negative for myalgias, back pain, joint swelling, gait problem and neck pain.  Skin: Negative.  Negative for pallor.  Allergic/Immunologic: Negative.   Neurological: Positive for tremors and weakness. Negative for dizziness, seizures, speech difficulty and headaches.  Hematological: Negative.   Psychiatric/Behavioral: Negative for confusion. The patient is nervous/anxious.     Filed Vitals:   03/01/15 1619  BP: 130/72  Pulse: 98  Temp: 97.9 F (36.6 C)  TempSrc: Oral  Resp: 20  Height: 5' 8" (1.727 m)  Weight: 155 lb 3.2 oz (70.398 kg)    SpO2: 95%   Body mass index is 23.6 kg/(m^2). Filed Weights   03/01/15 1619  Weight: 155 lb 3.2 oz (70.398 kg)     Physical Exam  Constitutional: He is oriented to person, place, and time. He appears well-developed and well-nourished.  HENT:  Head: Normocephalic and atraumatic.  Loss of hearing.  Eyes:  Severe visual losses due to macular degeneration and retinal hemorrhages.  Neck: Normal range of motion. Neck supple. No JVD present. No tracheal deviation present. No thyromegaly present.  Cardiovascular: Normal rate, regular rhythm and normal heart sounds.   Diminished DP and PT bilaterally.  Pulmonary/Chest: Effort normal and breath sounds normal.  Abdominal: Soft. Bowel sounds are normal. He exhibits no mass. There is no guarding.  Musculoskeletal: Normal range of motion. He exhibits no edema or tenderness.  Plan the right knee with movement. Mild crepitance. Very small effusion of the knee.  Lymphadenopathy:    He has no cervical adenopathy.  Neurological: He is alert and oriented to person, place, and time. No cranial nerve deficit. Coordination normal.  Diminished sensation to monofilament testing.  Skin: Skin is warm and dry.  Multiple SK.  Psychiatric: He has a normal mood and affect. His behavior is normal. Thought content normal.    Labs reviewed: Lab Summary Latest Ref Rng 02/25/2015 01/14/2015 09/10/2014  Hemoglobin 13.0 - 17.0 g/dL 15.3 (None) (None)  Hematocrit 39.0 - 52.0 % 44.8 (None) (None)  White count 4.0 - 10.5 K/uL 9.0 (None) (None)  Platelet count 150 - 400 K/uL 175 (None) (None)  Sodium 135 - 145 mmol/L 138 143 142  Potassium 3.5 - 5.1 mmol/L 4.3 4.3 5.1  Calcium 8.9 - 10.3 mg/dL 11.1(H) 10.8(H) 11.0(H)  Phosphorus - (None) (None) (None)  Creatinine 0.61 - 1.24 mg/dL 1.05 0.96 0.98  AST 15 - 41 U/L 24 18 (None)  Alk Phos 38 - 126 U/L 98 105 (None)  Bilirubin 0.3 - 1.2 mg/dL 0.4 0.4 (None)  Glucose 65 - 99 mg/dL 125(H) 120(H) 127(H)  Cholesterol -  (None) (None) (None)  HDL cholesterol >39 mg/dL (None) 42 42  Triglycerides 0 - 149 mg/dL (None) 239(H) 202(H)  LDL Direct - (None) (None) (None)  LDL Calc 0 - 99 mg/dL (None) 187(H) 168(H)  Total protein 6.5 - 8.1 g/dL 6.7 (None) (None)  Albumin 3.5 - 5.0 g/dL 4.4 4.5 (None)   No results found for: TSH, T3TOTAL, T4TOTAL, THYROIDAB Lab Results  Component Value Date   BUN 14 02/25/2015   BUN 10 01/14/2015   BUN 14 09/10/2014   Lab Results  Component Value Date   HGBA1C 7.9* 01/14/2015   HGBA1C 8.3* 09/10/2014   HGBA1C 7.8* 05/14/2014   Dg Chest 2 View  02/25/2015  CLINICAL DATA:  Cough and body aches. EXAM: CHEST  2 VIEW COMPARISON:  01/04/2014 FINDINGS: Both lungs are clear. Heart and mediastinum are within normal limits. Trachea is midline. No significant pleural effusions. No acute bone abnormality. IMPRESSION: No active cardiopulmonary disease. Electronically Signed   By: Markus Daft M.D.   On: 02/25/2015 17:47   Assessment/Plan  1. Acute bronchitis, unspecified organism Sample Anoro for 7 days Continue other meds as listed

## 2015-03-01 NOTE — Patient Instructions (Signed)
Ou were given a sample of Anoro to use for 7 days

## 2015-03-02 LAB — CULTURE, BLOOD (ROUTINE X 2)
Culture: NO GROWTH
Culture: NO GROWTH

## 2015-03-19 ENCOUNTER — Other Ambulatory Visit: Payer: Self-pay | Admitting: Internal Medicine

## 2015-03-21 DIAGNOSIS — M25511 Pain in right shoulder: Secondary | ICD-10-CM | POA: Diagnosis not present

## 2015-03-21 DIAGNOSIS — M94211 Chondromalacia, right shoulder: Secondary | ICD-10-CM | POA: Diagnosis not present

## 2015-03-21 DIAGNOSIS — M659 Synovitis and tenosynovitis, unspecified: Secondary | ICD-10-CM | POA: Diagnosis not present

## 2015-03-21 DIAGNOSIS — M66811 Spontaneous rupture of other tendons, right shoulder: Secondary | ICD-10-CM | POA: Diagnosis not present

## 2015-03-21 DIAGNOSIS — M24111 Other articular cartilage disorders, right shoulder: Secondary | ICD-10-CM | POA: Diagnosis not present

## 2015-03-21 DIAGNOSIS — G8918 Other acute postprocedural pain: Secondary | ICD-10-CM | POA: Diagnosis not present

## 2015-03-21 DIAGNOSIS — M19011 Primary osteoarthritis, right shoulder: Secondary | ICD-10-CM | POA: Diagnosis not present

## 2015-03-21 DIAGNOSIS — M75101 Unspecified rotator cuff tear or rupture of right shoulder, not specified as traumatic: Secondary | ICD-10-CM | POA: Diagnosis not present

## 2015-03-21 DIAGNOSIS — M75121 Complete rotator cuff tear or rupture of right shoulder, not specified as traumatic: Secondary | ICD-10-CM | POA: Diagnosis not present

## 2015-03-22 HISTORY — PX: SHOULDER ARTHROSCOPY: SHX128

## 2015-03-31 DIAGNOSIS — Z4789 Encounter for other orthopedic aftercare: Secondary | ICD-10-CM | POA: Diagnosis not present

## 2015-04-04 DIAGNOSIS — M25511 Pain in right shoulder: Secondary | ICD-10-CM | POA: Diagnosis not present

## 2015-04-04 DIAGNOSIS — M6281 Muscle weakness (generalized): Secondary | ICD-10-CM | POA: Diagnosis not present

## 2015-04-13 DIAGNOSIS — M25511 Pain in right shoulder: Secondary | ICD-10-CM | POA: Diagnosis not present

## 2015-04-13 DIAGNOSIS — M6281 Muscle weakness (generalized): Secondary | ICD-10-CM | POA: Diagnosis not present

## 2015-04-19 DIAGNOSIS — M6281 Muscle weakness (generalized): Secondary | ICD-10-CM | POA: Diagnosis not present

## 2015-04-19 DIAGNOSIS — M25511 Pain in right shoulder: Secondary | ICD-10-CM | POA: Diagnosis not present

## 2015-04-22 DIAGNOSIS — H353233 Exudative age-related macular degeneration, bilateral, with inactive scar: Secondary | ICD-10-CM | POA: Diagnosis not present

## 2015-04-22 DIAGNOSIS — H3322 Serous retinal detachment, left eye: Secondary | ICD-10-CM | POA: Diagnosis not present

## 2015-04-26 ENCOUNTER — Telehealth: Payer: Self-pay | Admitting: Internal Medicine

## 2015-04-26 DIAGNOSIS — M6281 Muscle weakness (generalized): Secondary | ICD-10-CM | POA: Diagnosis not present

## 2015-04-26 DIAGNOSIS — M25511 Pain in right shoulder: Secondary | ICD-10-CM | POA: Diagnosis not present

## 2015-04-26 NOTE — Telephone Encounter (Signed)
Called pt, he had declined a wrist splint..he is not interested.. Faxed form back to Medical  [employer] declining the request...cdavis

## 2015-04-28 DIAGNOSIS — Z4789 Encounter for other orthopedic aftercare: Secondary | ICD-10-CM | POA: Diagnosis not present

## 2015-04-28 DIAGNOSIS — M1711 Unilateral primary osteoarthritis, right knee: Secondary | ICD-10-CM | POA: Diagnosis not present

## 2015-05-03 DIAGNOSIS — M25511 Pain in right shoulder: Secondary | ICD-10-CM | POA: Diagnosis not present

## 2015-05-03 DIAGNOSIS — M6281 Muscle weakness (generalized): Secondary | ICD-10-CM | POA: Diagnosis not present

## 2015-05-05 DIAGNOSIS — M6281 Muscle weakness (generalized): Secondary | ICD-10-CM | POA: Diagnosis not present

## 2015-05-05 DIAGNOSIS — M25511 Pain in right shoulder: Secondary | ICD-10-CM | POA: Diagnosis not present

## 2015-05-06 ENCOUNTER — Other Ambulatory Visit: Payer: Self-pay | Admitting: Internal Medicine

## 2015-05-09 DIAGNOSIS — M6281 Muscle weakness (generalized): Secondary | ICD-10-CM | POA: Diagnosis not present

## 2015-05-09 DIAGNOSIS — M25511 Pain in right shoulder: Secondary | ICD-10-CM | POA: Diagnosis not present

## 2015-05-12 DIAGNOSIS — M6281 Muscle weakness (generalized): Secondary | ICD-10-CM | POA: Diagnosis not present

## 2015-05-12 DIAGNOSIS — M25511 Pain in right shoulder: Secondary | ICD-10-CM | POA: Diagnosis not present

## 2015-05-16 DIAGNOSIS — M6281 Muscle weakness (generalized): Secondary | ICD-10-CM | POA: Diagnosis not present

## 2015-05-16 DIAGNOSIS — M25511 Pain in right shoulder: Secondary | ICD-10-CM | POA: Diagnosis not present

## 2015-05-23 ENCOUNTER — Other Ambulatory Visit: Payer: Medicare Other

## 2015-05-23 DIAGNOSIS — I10 Essential (primary) hypertension: Secondary | ICD-10-CM | POA: Diagnosis not present

## 2015-05-23 DIAGNOSIS — M25511 Pain in right shoulder: Secondary | ICD-10-CM | POA: Diagnosis not present

## 2015-05-23 DIAGNOSIS — E1165 Type 2 diabetes mellitus with hyperglycemia: Principal | ICD-10-CM

## 2015-05-23 DIAGNOSIS — IMO0002 Reserved for concepts with insufficient information to code with codable children: Secondary | ICD-10-CM

## 2015-05-23 DIAGNOSIS — M6281 Muscle weakness (generalized): Secondary | ICD-10-CM | POA: Diagnosis not present

## 2015-05-23 DIAGNOSIS — E1140 Type 2 diabetes mellitus with diabetic neuropathy, unspecified: Secondary | ICD-10-CM

## 2015-05-24 LAB — BASIC METABOLIC PANEL
BUN/Creatinine Ratio: 17 (ref 10–24)
BUN: 15 mg/dL (ref 8–27)
CO2: 21 mmol/L (ref 18–29)
Calcium: 11.1 mg/dL — ABNORMAL HIGH (ref 8.6–10.2)
Chloride: 105 mmol/L (ref 96–106)
Creatinine, Ser: 0.87 mg/dL (ref 0.76–1.27)
GFR calc Af Amer: 91 mL/min/{1.73_m2} (ref >59)
GFR calc non Af Amer: 79 mL/min/{1.73_m2} (ref >59)
Glucose: 99 mg/dL (ref 65–99)
Potassium: 4.8 mmol/L (ref 3.5–5.2)
Sodium: 144 mmol/L (ref 134–144)

## 2015-05-24 LAB — HEMOGLOBIN A1C
Est. average glucose Bld gHb Est-mCnc: 186 mg/dL
Hgb A1c MFr Bld: 8.1 % — ABNORMAL HIGH (ref 4.8–5.6)

## 2015-05-24 LAB — MICROALBUMIN, URINE: Microalbumin, Urine: 6.6 ug/mL

## 2015-05-25 ENCOUNTER — Ambulatory Visit (INDEPENDENT_AMBULATORY_CARE_PROVIDER_SITE_OTHER): Payer: Medicare Other | Admitting: Internal Medicine

## 2015-05-25 ENCOUNTER — Encounter: Payer: Self-pay | Admitting: Internal Medicine

## 2015-05-25 VITALS — BP 124/70 | HR 88 | Temp 97.7°F | Ht 68.0 in | Wt 157.0 lb

## 2015-05-25 DIAGNOSIS — E1165 Type 2 diabetes mellitus with hyperglycemia: Secondary | ICD-10-CM | POA: Diagnosis not present

## 2015-05-25 DIAGNOSIS — G2581 Restless legs syndrome: Secondary | ICD-10-CM | POA: Diagnosis not present

## 2015-05-25 DIAGNOSIS — I10 Essential (primary) hypertension: Secondary | ICD-10-CM | POA: Diagnosis not present

## 2015-05-25 DIAGNOSIS — E1140 Type 2 diabetes mellitus with diabetic neuropathy, unspecified: Secondary | ICD-10-CM

## 2015-05-25 DIAGNOSIS — E785 Hyperlipidemia, unspecified: Secondary | ICD-10-CM

## 2015-05-25 DIAGNOSIS — IMO0002 Reserved for concepts with insufficient information to code with codable children: Secondary | ICD-10-CM

## 2015-05-25 DIAGNOSIS — M75101 Unspecified rotator cuff tear or rupture of right shoulder, not specified as traumatic: Secondary | ICD-10-CM

## 2015-05-25 DIAGNOSIS — M6281 Muscle weakness (generalized): Secondary | ICD-10-CM | POA: Diagnosis not present

## 2015-05-25 DIAGNOSIS — M75100 Unspecified rotator cuff tear or rupture of unspecified shoulder, not specified as traumatic: Secondary | ICD-10-CM | POA: Insufficient documentation

## 2015-05-25 DIAGNOSIS — M25511 Pain in right shoulder: Secondary | ICD-10-CM | POA: Diagnosis not present

## 2015-05-25 MED ORDER — INSULIN ISOPHANE & REGULAR (HUMAN 70-30)100 UNIT/ML KWIKPEN: PEN_INJECTOR | SUBCUTANEOUS | Status: DC

## 2015-05-25 MED ORDER — GABAPENTIN 100 MG PO CAPS: ORAL_CAPSULE | ORAL | Status: DC

## 2015-05-25 MED ORDER — INSULIN GLARGINE 100 UNIT/ML SOLOSTAR PEN: PEN_INJECTOR | SUBCUTANEOUS | Status: DC

## 2015-05-25 NOTE — Progress Notes (Signed)
Patient ID: Micheal [name], male   DOB: 10/26/1928, 82 y.o.   MRN: [id number]    Facility  [place]    Place of Service:   OFFICE    Allergies  Allergen Reactions  . Aspirin Other (See Comments)    Bothers stomach, thins blood   . Atorvastatin Other (See Comments)    Muscular pain and weakness  . Prednisone Other (See Comments)    Upset stomach   . Simvastatin Other (See Comments)    Muscular pain and weakness  . Sulfa Antibiotics Other (See Comments)    Upset stomach   . Metformin Hcl Nausea Only  . Voltaren [Diclofenac Sodium] Other (See Comments)    Chief Complaint  Patient presents with  . Medical Management of Chronic Issues    medication management blood sugar, cholesterol, blood pressure    HPI:   DM type 2, uncontrolled, with neuropathy ([place]) - poor control  Essential hypertension - controlled  Hyperlipidemia - follow up at future visit. Intolerant to statins.  Rotator cuff syndrome, right - arthroscopy by Dr. Veverly Fells at outpatient surgery in late Feb 2017. Movement of the right shoulder ahs improved.  Having a right restless leg sensation since earlier this year. Using hydrocodone to relieve the restless feeling. Not really having any pain.    Medications: Patient's Medications  New Prescriptions   No medications on file  Previous Medications   ACETAMINOPHEN (TYLENOL) 500 MG TABLET    Take 500 mg by mouth every 6 (six) hours as needed for pain. Take one tablet as needed for pain.   B-D ULTRAFINE III SHORT PEN 31G X 8 MM MISC    USE DAILY WITH ADMINISTRATION OF INSULIN DX E11.40, E11.65   CHOLECALCIFEROL (VITAMIN D) 1000 UNITS TABLET    Take 2,000 Units by mouth daily. Take 2 capsules daily for vitamin D supplement   FAMOTIDINE (PEPCID) 20 MG TABLET    Take 20 mg by mouth as needed. Take one tablet as needed for reflux.   HUMULIN 70/30 KWIKPEN (70-30) 100 UNIT/ML PEN    INJECT 12-15 UNITS IN THE MORNING & 28-30 UNITS IN THE EVENING TO CONTROL DIABETES   INSULIN GLARGINE (LANTUS SOLOSTAR) 100 UNIT/ML SOLOSTAR PEN    INJECT 30 UNITS AT BEDTIME FOR DIABETES.   INSULIN PEN NEEDLE (B-D ULTRAFINE III SHORT PEN) 31G X 8 MM MISC    Use twice daily with administration of insulin and humulin DX E11.40, E11.65   LANTUS SOLOSTAR 100 UNIT/ML SOLOSTAR PEN    INJECT 20 UNITS AT BEDTIME FOR DIABETES.   MULTIPLE VITAMIN (MULTIVITAMIN WITH MINERALS) TABS TABLET    Take 1 tablet by mouth daily.   NYSTATIN-TRIAMCINOLONE (MYCOLOG II) CREAM    APPLY TOPICALLY 4 TIMES DAY TO RASH   TAMSULOSIN (FLOMAX) 0.4 MG CAPS CAPSULE    Take one capsule once day for prostate  Modified Medications   No medications on file  Discontinued Medications   ALBUTEROL (PROVENTIL HFA;VENTOLIN HFA) 108 (90 BASE) MCG/ACT INHALER    Inhale 2 puffs into the lungs 2 (two) times daily.   AZITHROMYCIN (ZITHROMAX) 250 MG TABLET       HYDROCODONE-HOMATROPINE (HYCODAN) 5-1.5 MG/5ML SYRUP    Take 5 mLs by mouth every 6 (six) hours as needed for cough.   INSULIN ISOPHANE & REGULAR HUMAN (HUMULIN 70/30 KWIKPEN) (70-30) 100 UNIT/ML PEN    Inject 18 units in the morning  to control diabetes. DX E11.40, E11.65    Review of Systems  Constitutional: Positive for  fatigue and unexpected weight change (gaing weight; 4 # in 8 mo). Negative for fever, chills, diaphoresis, activity change and appetite change.  HENT: Positive for hearing loss.   Eyes: Positive for visual disturbance.       Severe visual problems related to macular degeneration, retinal hemorrhage,  Respiratory: Positive for shortness of breath (on exertion).   Cardiovascular: Negative.  Negative for chest pain, palpitations and leg swelling.  Endocrine: Negative for polydipsia, polyphagia and polyuria.       Diabetic. Poor control.  Genitourinary:       Hx prostate cancer. Also may have BPH. Followed by Dr. Risa Grill.  Musculoskeletal: Positive for arthralgias (right knee). Negative for myalgias, back pain, joint swelling, gait problem and neck  pain.  Skin: Negative.  Negative for pallor.  Allergic/Immunologic: Negative.   Neurological: Positive for tremors and weakness. Negative for dizziness, seizures, speech difficulty and headaches.  Hematological: Negative.   Psychiatric/Behavioral: Negative for confusion. The patient is nervous/anxious.     Filed Vitals:   05/25/15 1301  BP: 124/70  Pulse: 88  Temp: 97.7 F (36.5 C)  TempSrc: Oral  Height: 5' 8" (1.727 m)  Weight: 157 lb (71.215 kg)  SpO2: 98%   Body mass index is 23.88 kg/(m^2). Filed Weights   05/25/15 1301  Weight: 157 lb (71.215 kg)     Physical Exam  Constitutional: He is oriented to person, place, and time. He appears well-developed and well-nourished.  HENT:  Head: Normocephalic and atraumatic.  Loss of hearing.  Eyes:  Severe visual losses due to macular degeneration and retinal hemorrhages.  Neck: Normal range of motion. Neck supple. No JVD present. No tracheal deviation present. No thyromegaly present.  Cardiovascular: Normal rate, regular rhythm and normal heart sounds.   Diminished DP and PT bilaterally.  Pulmonary/Chest: Effort normal and breath sounds normal.  Abdominal: Soft. Bowel sounds are normal. He exhibits no mass. There is no guarding.  Musculoskeletal: Normal range of motion. He exhibits no edema or tenderness.  Pain in the right knee with movement. Mild crepitance. Very small effusion of the knee.  Lymphadenopathy:    He has no cervical adenopathy.  Neurological: He is alert and oriented to person, place, and time. No cranial nerve deficit. Coordination normal.  Diminished sensation to monofilament testing.  Skin: Skin is warm and dry.  Multiple SK.  Psychiatric: He has a normal mood and affect. His behavior is normal. Thought content normal.    Labs reviewed: Lab Summary Latest Ref Rng 05/23/2015 02/25/2015 01/14/2015  Hemoglobin 13.0 - 17.0 g/dL (None) 15.3 (None)  Hematocrit 39.0 - 52.0 % (None) 44.8 (None)  White count 4.0 -  10.5 K/uL (None) 9.0 (None)  Platelet count 150 - 400 K/uL (None) 175 (None)  Sodium 134 - 144 mmol/L 144 138 143  Potassium 3.5 - 5.2 mmol/L 4.8 4.3 4.3  Calcium 8.6 - 10.2 mg/dL 11.1(H) 11.1(H) 10.8(H)  Phosphorus - (None) (None) (None)  Creatinine 0.76 - 1.27 mg/dL 0.87 1.05 0.96  AST 15 - 41 U/L (None) 24 18  Alk Phos 38 - 126 U/L (None) 98 105  Bilirubin 0.3 - 1.2 mg/dL (None) 0.4 0.4  Glucose 65 - 99 mg/dL 99 125(H) 120(H)  Cholesterol - (None) (None) (None)  HDL cholesterol >39 mg/dL (None) (None) 42  Triglycerides 0 - 149 mg/dL (None) (None) 239(H)  LDL Direct - (None) (None) (None)  LDL Calc 0 - 99 mg/dL (None) (None) 187(H)  Total protein 6.5 - 8.1 g/dL (None) 6.7 (None)  Albumin 3.5 - 5.0 g/dL (None) 4.4 4.5   No results found for: TSH, T3TOTAL, T4TOTAL, THYROIDAB Lab Results  Component Value Date   BUN 15 05/23/2015   BUN 14 02/25/2015   BUN 10 01/14/2015   Lab Results  Component Value Date   HGBA1C 8.1* 05/23/2015   HGBA1C 7.9* 01/14/2015   HGBA1C 8.3* 09/10/2014    Assessment/Plan  1. DM type 2, uncontrolled, with neuropathy ([place]) - Hemoglobin A1c; Future - Basic metabolic panel; Future  2. Essential hypertension - Basic metabolic panel; Future  3. Hyperlipidemia  4. Rotator cuff syndrome, right [place]  5. Restless leg syndrome - gabapentin 100 mg 1-2 at bed to prevent restless legs

## 2015-05-30 DIAGNOSIS — M25511 Pain in right shoulder: Secondary | ICD-10-CM | POA: Diagnosis not present

## 2015-05-30 DIAGNOSIS — M6281 Muscle weakness (generalized): Secondary | ICD-10-CM | POA: Diagnosis not present

## 2015-05-31 DIAGNOSIS — Z4789 Encounter for other orthopedic aftercare: Secondary | ICD-10-CM | POA: Diagnosis not present

## 2015-05-31 DIAGNOSIS — M75101 Unspecified rotator cuff tear or rupture of right shoulder, not specified as traumatic: Secondary | ICD-10-CM | POA: Diagnosis not present

## 2015-06-06 ENCOUNTER — Other Ambulatory Visit: Payer: Self-pay | Admitting: Internal Medicine

## 2015-06-15 ENCOUNTER — Other Ambulatory Visit: Payer: Self-pay

## 2015-06-15 DIAGNOSIS — E1140 Type 2 diabetes mellitus with diabetic neuropathy, unspecified: Secondary | ICD-10-CM

## 2015-06-15 DIAGNOSIS — E1165 Type 2 diabetes mellitus with hyperglycemia: Principal | ICD-10-CM

## 2015-06-15 DIAGNOSIS — IMO0002 Reserved for concepts with insufficient information to code with codable children: Secondary | ICD-10-CM

## 2015-06-15 MED ORDER — INSULIN PEN NEEDLE 31G X 8 MM MISC: Status: DC

## 2015-06-15 MED ORDER — INSULIN ISOPHANE & REGULAR (HUMAN 70-30)100 UNIT/ML KWIKPEN: PEN_INJECTOR | SUBCUTANEOUS | Status: DC

## 2015-06-15 MED ORDER — INSULIN GLARGINE 100 UNIT/ML SOLOSTAR PEN: PEN_INJECTOR | SUBCUTANEOUS | Status: DC

## 2015-07-06 ENCOUNTER — Ambulatory Visit: Payer: Medicare Other | Admitting: Internal Medicine

## 2015-07-11 DIAGNOSIS — M1711 Unilateral primary osteoarthritis, right knee: Secondary | ICD-10-CM | POA: Diagnosis not present

## 2015-07-13 ENCOUNTER — Ambulatory Visit: Payer: Medicare Other | Admitting: Internal Medicine

## 2015-08-16 NOTE — Addendum Note (Signed)
Addended by: Moshe Cipro Athaliah Baumbach A on: 08/16/2015 04:16 PM   Modules accepted: Orders

## 2015-08-22 DIAGNOSIS — H3322 Serous retinal detachment, left eye: Secondary | ICD-10-CM | POA: Diagnosis not present

## 2015-08-22 DIAGNOSIS — H353233 Exudative age-related macular degeneration, bilateral, with inactive scar: Secondary | ICD-10-CM | POA: Diagnosis not present

## 2015-08-24 ENCOUNTER — Other Ambulatory Visit: Payer: Medicare Other

## 2015-08-24 DIAGNOSIS — I10 Essential (primary) hypertension: Secondary | ICD-10-CM

## 2015-08-24 DIAGNOSIS — E1140 Type 2 diabetes mellitus with diabetic neuropathy, unspecified: Secondary | ICD-10-CM | POA: Diagnosis not present

## 2015-08-24 DIAGNOSIS — E1165 Type 2 diabetes mellitus with hyperglycemia: Principal | ICD-10-CM

## 2015-08-24 DIAGNOSIS — IMO0002 Reserved for concepts with insufficient information to code with codable children: Secondary | ICD-10-CM

## 2015-08-24 LAB — BASIC METABOLIC PANEL
BUN: 19 mg/dL (ref 7–25)
CO2: 24 mmol/L (ref 20–31)
Calcium: 10.6 mg/dL — ABNORMAL HIGH (ref 8.6–10.3)
Chloride: 111 mmol/L — ABNORMAL HIGH (ref 98–110)
Creat: 1.07 mg/dL (ref 0.70–1.11)
Glucose, Bld: 37 mg/dL — CL (ref 65–99)
Potassium: 4.2 mmol/L (ref 3.5–5.3)
Sodium: 143 mmol/L (ref 135–146)

## 2015-08-24 LAB — HEMOGLOBIN A1C
Hgb A1c MFr Bld: 7.6 % — ABNORMAL HIGH (ref <5.7)
Mean Plasma Glucose: 171 mg/dL

## 2015-08-25 DIAGNOSIS — H353233 Exudative age-related macular degeneration, bilateral, with inactive scar: Secondary | ICD-10-CM | POA: Diagnosis not present

## 2015-08-25 DIAGNOSIS — H532 Diplopia: Secondary | ICD-10-CM | POA: Diagnosis not present

## 2015-08-25 DIAGNOSIS — H542 Low vision, both eyes: Secondary | ICD-10-CM | POA: Diagnosis not present

## 2015-08-25 DIAGNOSIS — H3322 Serous retinal detachment, left eye: Secondary | ICD-10-CM | POA: Diagnosis not present

## 2015-08-30 ENCOUNTER — Encounter: Payer: Self-pay | Admitting: Internal Medicine

## 2015-08-30 ENCOUNTER — Ambulatory Visit (INDEPENDENT_AMBULATORY_CARE_PROVIDER_SITE_OTHER): Payer: Medicare Other | Admitting: Internal Medicine

## 2015-08-30 VITALS — BP 142/82 | HR 76 | Temp 97.5°F | Ht 68.0 in | Wt 157.0 lb

## 2015-08-30 DIAGNOSIS — M545 Low back pain, unspecified: Secondary | ICD-10-CM

## 2015-08-30 DIAGNOSIS — M25561 Pain in right knee: Secondary | ICD-10-CM | POA: Diagnosis not present

## 2015-08-30 DIAGNOSIS — G8929 Other chronic pain: Secondary | ICD-10-CM | POA: Diagnosis not present

## 2015-08-30 DIAGNOSIS — E1165 Type 2 diabetes mellitus with hyperglycemia: Secondary | ICD-10-CM | POA: Diagnosis not present

## 2015-08-30 DIAGNOSIS — E1140 Type 2 diabetes mellitus with diabetic neuropathy, unspecified: Secondary | ICD-10-CM

## 2015-08-30 DIAGNOSIS — E785 Hyperlipidemia, unspecified: Secondary | ICD-10-CM

## 2015-08-30 DIAGNOSIS — IMO0002 Reserved for concepts with insufficient information to code with codable children: Secondary | ICD-10-CM

## 2015-08-30 DIAGNOSIS — I10 Essential (primary) hypertension: Secondary | ICD-10-CM

## 2015-08-30 NOTE — Progress Notes (Signed)
Facility  [place]    Place of Service:   OFFICE    Allergies  Allergen Reactions  . Aspirin Other (See Comments)    Bothers stomach, thins blood   . Atorvastatin Other (See Comments)    Muscular pain and weakness  . Prednisone Other (See Comments)    Upset stomach   . Simvastatin Other (See Comments)    Muscular pain and weakness  . Sulfa Antibiotics Other (See Comments)    Upset stomach   . Metformin Hcl Nausea Only  . Voltaren [Diclofenac Sodium] Other (See Comments)    Chief Complaint  Patient presents with  . Medical Management of Chronic Issues    3 month medication management blood sugar, cholesterol, blood pressure. BS 119 this morning. Checks BS twice a day. Range 89 - 109 in am; 134 - 149 evening    HPI:  Multiple small wounds of the right leg where he ran into his trailer. Last tetanus vaccine in 2012.  DM type 2, uncontrolled, with neuropathy ([place]) - improved A1c. Glucose in Am are better. On one day it was 45 but he was asymptomatic.  Essential hypertension - controlled  Hyperlipidemia - controlled  Chronic midline low back pain without sciatica - improved  Knee pain, right - considering hyaluronate injections  Continues to ophth at [facility]. Not legally blind but severely impaired vision.  Medications: Patient's Medications  New Prescriptions   No medications on file  Previous Medications   ACETAMINOPHEN (TYLENOL) 500 MG TABLET    Take 500 mg by mouth every 6 (six) hours as needed for pain. Take one tablet as needed for pain.   CHOLECALCIFEROL (VITAMIN D) 1000 UNITS TABLET    Take 2,000 Units by mouth daily. Take 2 capsules daily for vitamin D supplement   FAMOTIDINE (PEPCID) 20 MG TABLET    Take 20 mg by mouth as needed. Take one tablet as needed for reflux.   GABAPENTIN (NEURONTIN) 100 MG CAPSULE    Take 1 or 2 at bed to prevent restless legs   INSULIN GLARGINE (LANTUS SOLOSTAR) 100 UNIT/ML SOLOSTAR PEN    INJECT 40 UNITS AT BEDTIME FOR DIABETES.   INSULIN ISOPHANE & REGULAR HUMAN (HUMULIN 70/30 KWIKPEN) (70-30) 100 UNIT/ML PEN    Inject 20 units each mornig   INSULIN PEN NEEDLE (B-D ULTRAFINE III SHORT PEN) 31G X 8 MM MISC    Use twice daily with administration of insulin and humulin DX E11.40, E11.65   INSULIN PEN NEEDLE (B-D ULTRAFINE III SHORT PEN) 31G X 8 MM MISC    USE DAILY WITH ADMINISTRATION OF INSULIN DX E11.40, E11.65   MULTIPLE VITAMIN (MULTIVITAMIN WITH MINERALS) TABS TABLET    Take 1 tablet by mouth daily.   NYSTATIN-TRIAMCINOLONE (MYCOLOG II) CREAM    APPLY TOPICALLY 4 TIMES DAY TO RASH   TAMSULOSIN (FLOMAX) 0.4 MG CAPS CAPSULE    Take 1 capsule by mouth  daily for prostate  Modified Medications   No medications on file  Discontinued Medications   No medications on file    Review of Systems  Constitutional: Negative for activity change, appetite change, chills, diaphoresis, fatigue, fever and unexpected weight change.  HENT: Negative for congestion, ear pain, hearing loss, rhinorrhea, sore throat, tinnitus, trouble swallowing and voice change.   Eyes: Positive for visual disturbance (Severe diffuse visual disturbances related to retinal disease.).       Corrective lenses  Respiratory: Negative for cough, choking, chest tightness, shortness of breath and wheezing.  Cardiovascular: Negative.  Negative for chest pain, palpitations and leg swelling.  Gastrointestinal: Negative for abdominal distention, abdominal pain, constipation, diarrhea and nausea.  Endocrine: Negative for cold intolerance, heat intolerance, polydipsia, polyphagia and polyuria.       Diabetic. Poor control.  Genitourinary: Negative for dysuria, frequency, testicular pain and urgency.       Not incontinent  Musculoskeletal: Negative for arthralgias, back pain, gait problem, joint swelling, myalgias and neck pain.  Skin: Positive for wound (right foreleg). Negative for color change, pallor and rash.  Allergic/Immunologic: Negative.   Neurological:  Negative for dizziness, tremors, seizures, syncope, speech difficulty, weakness, numbness and headaches.  Hematological: Negative.  Negative for adenopathy. Does not bruise/bleed easily.  Psychiatric/Behavioral: Negative for behavioral problems, confusion, decreased concentration, hallucinations and sleep disturbance. The patient is not nervous/anxious.     Vitals:   08/30/15 1515  BP: (!) 142/82  Pulse: 76  Temp: 97.5 F (36.4 C)  TempSrc: Oral  SpO2: 95%  Weight: 157 lb (71.2 kg)  Height: 5' 8" (1.727 m)   Body mass index is 23.87 kg/m. Wt Readings from Last 3 Encounters:  08/30/15 157 lb (71.2 kg)  05/25/15 157 lb (71.2 kg)  03/01/15 155 lb 3.2 oz (70.4 kg)      Physical Exam  Constitutional: He is oriented to person, place, and time. He appears well-developed and well-nourished.  HENT:  Head: Normocephalic and atraumatic.  Loss of hearing.  Eyes:  Severe visual losses due to macular degeneration and retinal hemorrhages.  Neck: Normal range of motion. Neck supple. No JVD present. No tracheal deviation present. No thyromegaly present.  Cardiovascular: Normal rate, regular rhythm and normal heart sounds.   Diminished DP and PT bilaterally.  Pulmonary/Chest: Effort normal and breath sounds normal.  Abdominal: Soft. Bowel sounds are normal. He exhibits no mass. There is no guarding.  Musculoskeletal: Normal range of motion. He exhibits no edema or tenderness.  Pain in the right knee with movement. Mild crepitance. Very small effusion of the knee.  Lymphadenopathy:    He has no cervical adenopathy.  Neurological: He is alert and oriented to person, place, and time. No cranial nerve deficit. Coordination normal.  Diminished sensation to monofilament testing.  Skin: Skin is warm and dry.  Multiple SK. Small puncture wounds of the right leg.  Psychiatric: He has a normal mood and affect. His behavior is normal. Thought content normal.    Labs reviewed: Lab Summary Latest  Ref Rng & Units 08/24/2015 05/23/2015 02/25/2015 01/14/2015  Hemoglobin 13.0 - 17.0 g/dL (None) (None) 15.3 (None)  Hematocrit 39.0 - 52.0 % (None) (None) 44.8 (None)  White count 4.0 - 10.5 K/uL (None) (None) 9.0 (None)  Platelet count 150 - 400 K/uL (None) (None) 175 (None)  Sodium 135 - 146 mmol/L 143 144 138 143  Potassium 3.5 - 5.3 mmol/L 4.2 4.8 4.3 4.3  Calcium 8.6 - 10.3 mg/dL 10.6(H) 11.1(H) 11.1(H) 10.8(H)  Phosphorus - (None) (None) (None) (None)  Creatinine 0.70 - 1.11 mg/dL 1.07 0.87 1.05 0.96  AST 15 - 41 U/L (None) (None) 24 18  Alk Phos 38 - 126 U/L (None) (None) 98 105  Bilirubin 0.3 - 1.2 mg/dL (None) (None) 0.4 0.4  Glucose 65 - 99 mg/dL 37(LL) 99 125(H) 120(H)  Cholesterol - (None) (None) (None) (None)  HDL cholesterol >39 mg/dL (None) (None) (None) 42  Triglycerides 0 - 149 mg/dL (None) (None) (None) 239(H)  LDL Direct - (None) (None) (None) (None)  LDL Calc 0 - 99 mg/dL (  None) (None) (None) 187(H)  Total protein 6.5 - 8.1 g/dL (None) (None) 6.7 (None)  Albumin 3.5 - 5.0 g/dL (None) (None) 4.4 4.5  Some recent data might be hidden   No results found for: TSH, T3TOTAL, T4TOTAL, THYROIDAB Lab Results  Component Value Date   BUN 19 08/24/2015   BUN 15 05/23/2015   BUN 14 02/25/2015   Lab Results  Component Value Date   HGBA1C 7.6 (H) 08/24/2015   HGBA1C 8.1 (H) 05/23/2015   HGBA1C 7.9 (H) 01/14/2015    Assessment/Plan  1. DM type 2, uncontrolled, with neuropathy ([place]) - Hemoglobin A1c; Future - Comprehensive metabolic panel; Future  2. Essential hypertension - Comprehensive metabolic panel; Future  3. Hyperlipidemia - Lipid panel; Future  4. Chronic midline low back pain without sciatica improved  5. Knee pain, right Continue to see Dr. Veverly Fells

## 2015-10-05 DIAGNOSIS — D225 Melanocytic nevi of trunk: Secondary | ICD-10-CM | POA: Diagnosis not present

## 2015-10-05 DIAGNOSIS — L821 Other seborrheic keratosis: Secondary | ICD-10-CM | POA: Diagnosis not present

## 2015-10-07 DIAGNOSIS — Z23 Encounter for immunization: Secondary | ICD-10-CM | POA: Diagnosis not present

## 2015-10-25 DIAGNOSIS — M1711 Unilateral primary osteoarthritis, right knee: Secondary | ICD-10-CM | POA: Diagnosis not present

## 2015-11-01 DIAGNOSIS — M1711 Unilateral primary osteoarthritis, right knee: Secondary | ICD-10-CM | POA: Diagnosis not present

## 2015-11-07 DIAGNOSIS — R3915 Urgency of urination: Secondary | ICD-10-CM | POA: Diagnosis not present

## 2015-11-07 DIAGNOSIS — C61 Malignant neoplasm of prostate: Secondary | ICD-10-CM | POA: Diagnosis not present

## 2015-11-07 DIAGNOSIS — Z8546 Personal history of malignant neoplasm of prostate: Secondary | ICD-10-CM | POA: Diagnosis not present

## 2015-11-07 DIAGNOSIS — N401 Enlarged prostate with lower urinary tract symptoms: Secondary | ICD-10-CM | POA: Diagnosis not present

## 2015-11-08 DIAGNOSIS — M1711 Unilateral primary osteoarthritis, right knee: Secondary | ICD-10-CM | POA: Diagnosis not present

## 2015-11-23 ENCOUNTER — Ambulatory Visit (INDEPENDENT_AMBULATORY_CARE_PROVIDER_SITE_OTHER): Payer: Medicare Other | Admitting: Internal Medicine

## 2015-11-23 ENCOUNTER — Encounter: Payer: Self-pay | Admitting: Internal Medicine

## 2015-11-23 VITALS — BP 124/80 | HR 94 | Temp 97.8°F | Ht 68.0 in | Wt 156.0 lb

## 2015-11-23 DIAGNOSIS — E1140 Type 2 diabetes mellitus with diabetic neuropathy, unspecified: Secondary | ICD-10-CM | POA: Diagnosis not present

## 2015-11-23 DIAGNOSIS — R531 Weakness: Secondary | ICD-10-CM | POA: Diagnosis not present

## 2015-11-23 DIAGNOSIS — E1165 Type 2 diabetes mellitus with hyperglycemia: Secondary | ICD-10-CM

## 2015-11-23 DIAGNOSIS — I471 Supraventricular tachycardia: Secondary | ICD-10-CM | POA: Diagnosis not present

## 2015-11-23 DIAGNOSIS — I10 Essential (primary) hypertension: Secondary | ICD-10-CM

## 2015-11-23 DIAGNOSIS — IMO0002 Reserved for concepts with insufficient information to code with codable children: Secondary | ICD-10-CM

## 2015-11-23 LAB — COMPREHENSIVE METABOLIC PANEL
ALT: 13 U/L (ref 9–46)
AST: 17 U/L (ref 10–35)
Albumin: 4.3 g/dL (ref 3.6–5.1)
Alkaline Phosphatase: 102 U/L (ref 40–115)
BUN: 12 mg/dL (ref 7–25)
CO2: 24 mmol/L (ref 20–31)
Calcium: 10.7 mg/dL — ABNORMAL HIGH (ref 8.6–10.3)
Chloride: 107 mmol/L (ref 98–110)
Creat: 1.18 mg/dL — ABNORMAL HIGH (ref 0.70–1.11)
Glucose, Bld: 145 mg/dL — ABNORMAL HIGH (ref 65–99)
Potassium: 4.8 mmol/L (ref 3.5–5.3)
Sodium: 140 mmol/L (ref 135–146)
Total Bilirubin: 0.4 mg/dL (ref 0.2–1.2)
Total Protein: 6.2 g/dL (ref 6.1–8.1)

## 2015-11-23 LAB — CBC WITH DIFFERENTIAL/PLATELET
Basophils Absolute: 0 cells/uL (ref 0–200)
Basophils Relative: 0 %
Eosinophils Absolute: 226 cells/uL (ref 15–500)
Eosinophils Relative: 2 %
HCT: 40.3 % (ref 38.5–50.0)
Hemoglobin: 13.7 g/dL (ref 13.2–17.1)
Lymphocytes Relative: 12 %
Lymphs Abs: 1356 cells/uL (ref 850–3900)
MCH: 30.2 pg (ref 27.0–33.0)
MCHC: 34 g/dL (ref 32.0–36.0)
MCV: 88.8 fL (ref 80.0–100.0)
MPV: 11.1 fL (ref 7.5–12.5)
Monocytes Absolute: 1130 cells/uL — ABNORMAL HIGH (ref 200–950)
Monocytes Relative: 10 %
Neutro Abs: 8588 cells/uL — ABNORMAL HIGH (ref 1500–7800)
Neutrophils Relative %: 76 %
Platelets: 222 10*3/uL (ref 140–400)
RBC: 4.54 MIL/uL (ref 4.20–5.80)
RDW: 12.8 % (ref 11.0–15.0)
WBC: 11.3 10*3/uL — ABNORMAL HIGH (ref 3.8–10.8)

## 2015-11-23 LAB — TSH: TSH: 1.64 mIU/L (ref 0.40–4.50)

## 2015-11-23 MED ORDER — METOPROLOL TARTRATE 50 MG PO TABS: 50.0000 mg | ORAL_TABLET | Freq: Two times a day (BID) | ORAL | 3 refills | Status: DC

## 2015-11-23 NOTE — Progress Notes (Signed)
Facility  [place]    Place of Service:   OFFICE    Allergies  Allergen Reactions  . Aspirin Other (See Comments)    Bothers stomach, thins blood   . Atorvastatin Other (See Comments)    Muscular pain and weakness  . Prednisone Other (See Comments)    Upset stomach   . Simvastatin Other (See Comments)    Muscular pain and weakness  . Sulfa Antibiotics Other (See Comments)    Upset stomach   . Metformin Hcl Nausea Only  . Voltaren [Diclofenac Sodium] Other (See Comments)    Chief Complaint  Patient presents with  . Acute Visit    weak, confused, fatigue, when he does anything he just pants, falls alseep when he sets down, "in a fog"  about 4-5 weeks. Here with wife.      HPI:  Generalized weakness when he goes into his back yard. SOB with minimal exertion. Sleeps in normal position and not propped up. Falls asleep easily now in front of the TV.  Felt worse since he got flu vaccine on 10/07/15.   Getting injections in the right knee.  Diabetes is under control. Home CBG 100-139 in AM and 140-160 in the evenings.  Medications: Patient's Medications  New Prescriptions   No medications on file  Previous Medications   ACETAMINOPHEN (TYLENOL) 500 MG TABLET    Take 500 mg by mouth every 6 (six) hours as needed for pain. Take one tablet as needed for pain.   CHOLECALCIFEROL (VITAMIN D) 1000 UNITS TABLET    Take 2,000 Units by mouth daily. Take 2 capsules daily for vitamin D supplement   FAMOTIDINE (PEPCID) 20 MG TABLET    Take 20 mg by mouth as needed. Take one tablet as needed for reflux.   INSULIN GLARGINE (LANTUS SOLOSTAR) 100 UNIT/ML SOLOSTAR PEN    INJECT 40 UNITS AT BEDTIME FOR DIABETES.   INSULIN ISOPHANE & REGULAR HUMAN (HUMULIN 70/30 KWIKPEN) (70-30) 100 UNIT/ML PEN    Inject 20 units each mornig   INSULIN PEN NEEDLE (B-D ULTRAFINE III SHORT PEN) 31G X 8 MM MISC    Use twice daily with administration of insulin and humulin DX E11.40, E11.65   INSULIN PEN NEEDLE (B-D  ULTRAFINE III SHORT PEN) 31G X 8 MM MISC    USE DAILY WITH ADMINISTRATION OF INSULIN DX E11.40, E11.65   MULTIPLE VITAMIN (MULTIVITAMIN WITH MINERALS) TABS TABLET    Take 1 tablet by mouth daily.   NYSTATIN-TRIAMCINOLONE (MYCOLOG II) CREAM    APPLY TOPICALLY 4 TIMES DAY TO RASH   TAMSULOSIN (FLOMAX) 0.4 MG CAPS CAPSULE    Take 1 capsule by mouth  daily for prostate  Modified Medications   No medications on file  Discontinued Medications   GABAPENTIN (NEURONTIN) 100 MG CAPSULE    Take 1 or 2 at bed to prevent restless legs    Review of Systems  Constitutional: Negative for activity change, appetite change, chills, diaphoresis, fatigue, fever and unexpected weight change.  HENT: Negative for congestion, ear pain, hearing loss, rhinorrhea, sore throat, tinnitus, trouble swallowing and voice change.   Eyes: Positive for visual disturbance (Severe diffuse visual disturbances related to retinal disease.).       Corrective lenses  Respiratory: Positive for chest tightness and shortness of breath. Negative for cough, choking and wheezing.   Cardiovascular: Negative.  Negative for chest pain, palpitations and leg swelling.  Gastrointestinal: Negative for abdominal distention, abdominal pain, constipation, diarrhea and nausea.  Endocrine: Negative  for cold intolerance, heat intolerance, polydipsia, polyphagia and polyuria.       Diabetic. Poor control.  Genitourinary: Negative for dysuria, frequency, testicular pain and urgency.       Not incontinent  Musculoskeletal: Negative for arthralgias, back pain, gait problem, joint swelling, myalgias and neck pain.  Skin: Positive for wound (right foreleg). Negative for color change, pallor and rash.  Allergic/Immunologic: Negative.   Neurological: Positive for weakness (generalized). Negative for dizziness, tremors, seizures, syncope, speech difficulty, numbness and headaches.  Hematological: Negative.  Negative for adenopathy. Does not bruise/bleed easily.   Psychiatric/Behavioral: Negative for behavioral problems, confusion, decreased concentration, hallucinations and sleep disturbance. The patient is not nervous/anxious.     Vitals:   11/23/15 1558  BP: 124/80  Pulse: 94  Temp: 97.8 F (36.6 C)  TempSrc: Oral  SpO2: 96%  Weight: 156 lb (70.8 kg)  Height: _0  (1.727 m)   Body mass index is 23.72 kg/m. Wt Readings from Last 3 Encounters:  11/23/15 156 lb (70.8 kg)  08/30/15 157 lb (71.2 kg)  05/25/15 157 lb (71.2 kg)      Physical Exam  Constitutional: He is oriented to person, place, and time. He appears well-developed and well-nourished. No distress.  HENT:  Head: Normocephalic and atraumatic.  Right Ear: External ear normal.  Left Ear: External ear normal.  Nose: Nose normal.  Mouth/Throat: Oropharynx is clear and moist. No oropharyngeal exudate.  Loss of hearing.  Eyes: Conjunctivae and EOM are normal. Pupils are equal, round, and reactive to light.  Severe visual losses due to macular degeneration and retinal hemorrhages.  Neck: Normal range of motion. Neck supple. No JVD present. No tracheal deviation present. No thyromegaly present.  Cardiovascular: Normal rate, regular rhythm, normal heart sounds and intact distal pulses.  Exam reveals no gallop and no friction rub.   No murmur heard. Diminished DP and PT bilaterally.  Pulmonary/Chest: Effort normal and breath sounds normal. No respiratory distress. He has no wheezes. He has no rales. He exhibits no tenderness.  Abdominal: Soft. Bowel sounds are normal. He exhibits no distension and no mass. There is no tenderness. There is no guarding.  Musculoskeletal: Normal range of motion. He exhibits no edema or tenderness.  Pain in the right knee with movement. Mild crepitance. Very small effusion of the knee.  Lymphadenopathy:    He has no cervical adenopathy.  Neurological: He is alert and oriented to person, place, and time. He has normal reflexes. No cranial nerve  deficit. Coordination normal.  Diminished sensation to monofilament testing.  Skin: Skin is warm and dry. No rash noted. No erythema. No pallor.  Multiple SK. Small puncture wounds of the right leg.  Psychiatric: He has a normal mood and affect. His behavior is normal. Judgment and thought content normal.    Labs reviewed: Lab Summary Latest Ref Rng & Units 08/24/2015 05/23/2015 02/25/2015 01/14/2015  Hemoglobin 13.0 - 17.0 g/dL (None) (None) 15.3 (None)  Hematocrit 39.0 - 52.0 % (None) (None) 44.8 (None)  White count 4.0 - 10.5 K/uL (None) (None) 9.0 (None)  Platelet count 150 - 400 K/uL (None) (None) 175 (None)  Sodium 135 - 146 mmol/L 143 144 138 143  Potassium 3.5 - 5.3 mmol/L 4.2 4.8 4.3 4.3  Calcium 8.6 - 10.3 mg/dL 10.6(H) 11.1(H) 11.1(H) 10.8(H)  Phosphorus - (None) (None) (None) (None)  Creatinine 0.70 - 1.11 mg/dL 1.07 0.87 1.05 0.96  AST 15 - 41 U/L (None) (None) 24 18  Alk Phos 38 - 126 U/L (  None) (None) 98 105  Bilirubin 0.3 - 1.2 mg/dL (None) (None) 0.4 0.4  Glucose 65 - 99 mg/dL 37(LL) 99 125(H) 120(H)  Cholesterol - (None) (None) (None) (None)  HDL cholesterol >39 mg/dL (None) (None) (None) 42  Triglycerides 0 - 149 mg/dL (None) (None) (None) 239(H)  LDL Direct - (None) (None) (None) (None)  LDL Calc 0 - 99 mg/dL (None) (None) (None) 187(H)  Total protein 6.5 - 8.1 g/dL (None) (None) 6.7 (None)  Albumin 3.5 - 5.0 g/dL (None) (None) 4.4 4.5  Some recent data might be hidden   No results found for: TSH, T3TOTAL, T4TOTAL, THYROIDAB Lab Results  Component Value Date   BUN 19 08/24/2015   BUN 15 05/23/2015   BUN 14 02/25/2015   Lab Results  Component Value Date   HGBA1C 7.6 (H) 08/24/2015   HGBA1C 8.1 (H) 05/23/2015   HGBA1C 7.9 (H) 01/14/2015   11/23/15 EKG: rate 106. Sinus tachycardia. RBBB. RVH with repolarization abnormality.  Assessment/Plan  1. Paroxysmal supraventricular tachycardia ([place]) ? Related to weak feelings - EKG 12-Lead - metoprolol (LOPRESSOR)  50 MG tablet; Take 1 tablet (50 mg total) by mouth 2 (two) times daily.  Dispense: 180 tablet; Refill: 3  2. Weak Check lab. May need cardiology referral if no significant abnormalities - CBC with Differential/Platelet - Comprehensive metabolic panel - TSH  3. DM type 2, uncontrolled, with neuropathy ([facility]) - Comprehensive metabolic panel  4. Essential hypertension - Comprehensive metabolic panel

## 2015-11-29 ENCOUNTER — Encounter: Payer: Self-pay | Admitting: Internal Medicine

## 2015-11-29 ENCOUNTER — Ambulatory Visit (INDEPENDENT_AMBULATORY_CARE_PROVIDER_SITE_OTHER): Payer: Medicare Other | Admitting: Internal Medicine

## 2015-11-29 VITALS — BP 124/68 | HR 80 | Temp 97.3°F | Resp 16 | Ht 68.0 in | Wt 158.6 lb

## 2015-11-29 DIAGNOSIS — E1140 Type 2 diabetes mellitus with diabetic neuropathy, unspecified: Secondary | ICD-10-CM | POA: Diagnosis not present

## 2015-11-29 DIAGNOSIS — I10 Essential (primary) hypertension: Secondary | ICD-10-CM | POA: Diagnosis not present

## 2015-11-29 DIAGNOSIS — R531 Weakness: Secondary | ICD-10-CM

## 2015-11-29 DIAGNOSIS — I471 Supraventricular tachycardia: Secondary | ICD-10-CM

## 2015-11-29 DIAGNOSIS — E1165 Type 2 diabetes mellitus with hyperglycemia: Secondary | ICD-10-CM

## 2015-11-29 DIAGNOSIS — IMO0002 Reserved for concepts with insufficient information to code with codable children: Secondary | ICD-10-CM

## 2015-11-29 NOTE — Progress Notes (Signed)
Facility  [place]    Place of Service:   OFFICE    Allergies  Allergen Reactions  . Aspirin Other (See Comments)    Bothers stomach, thins blood   . Atorvastatin Other (See Comments)    Muscular pain and weakness  . Prednisone Other (See Comments)    Upset stomach   . Simvastatin Other (See Comments)    Muscular pain and weakness  . Sulfa Antibiotics Other (See Comments)    Upset stomach   . Metformin Hcl Nausea Only  . Voltaren [Diclofenac Sodium] Other (See Comments)    Chief Complaint  Patient presents with  . Medical Management of Chronic Issues    1 week follow up    HPI:  Last seen 11/23/15 with weakness. Lab seemed normal except mild elevation in WBC and glucose of 145. He says he still feels lifeless at times.  Continues to have dyspnea with exertion.  Was started on metoprolol last visit for tachycardia. Pulse has slowed to 80.   Medications: Patient's Medications  New Prescriptions   No medications on file  Previous Medications   ACETAMINOPHEN (TYLENOL) 500 MG TABLET    Take 500 mg by mouth every 6 (six) hours as needed for pain. Take one tablet as needed for pain.   CHOLECALCIFEROL (VITAMIN D) 1000 UNITS TABLET    Take 2,000 Units by mouth daily. Take 2 capsules daily for vitamin D supplement   FAMOTIDINE (PEPCID) 20 MG TABLET    Take 20 mg by mouth as needed. Take one tablet as needed for reflux.   INSULIN GLARGINE (LANTUS SOLOSTAR) 100 UNIT/ML SOLOSTAR PEN    INJECT 40 UNITS AT BEDTIME FOR DIABETES.   INSULIN ISOPHANE & REGULAR HUMAN (HUMULIN 70/30 KWIKPEN) (70-30) 100 UNIT/ML PEN    Inject 20 units each mornig   INSULIN PEN NEEDLE (B-D ULTRAFINE III SHORT PEN) 31G X 8 MM MISC    Use twice daily with administration of insulin and humulin DX E11.40, E11.65   INSULIN PEN NEEDLE (B-D ULTRAFINE III SHORT PEN) 31G X 8 MM MISC    USE DAILY WITH ADMINISTRATION OF INSULIN DX E11.40, E11.65   METOPROLOL (LOPRESSOR) 50 MG TABLET    Take 1 tablet (50 mg total) by  mouth 2 (two) times daily.   MULTIPLE VITAMIN (MULTIVITAMIN WITH MINERALS) TABS TABLET    Take 1 tablet by mouth daily.   NYSTATIN-TRIAMCINOLONE (MYCOLOG II) CREAM    APPLY TOPICALLY 4 TIMES DAY TO RASH   TAMSULOSIN (FLOMAX) 0.4 MG CAPS CAPSULE    Take 1 capsule by mouth  daily for prostate  Modified Medications   No medications on file  Discontinued Medications   No medications on file    Review of Systems  Constitutional: Negative for activity change, appetite change, chills, diaphoresis, fatigue, fever and unexpected weight change.  HENT: Negative for congestion, ear pain, hearing loss, rhinorrhea, sore throat, tinnitus, trouble swallowing and voice change.   Eyes: Positive for visual disturbance (Severe diffuse visual disturbances related to retinal disease.).       Corrective lenses  Respiratory: Positive for chest tightness and shortness of breath. Negative for cough, choking and wheezing.   Cardiovascular: Negative.  Negative for chest pain, palpitations and leg swelling.  Gastrointestinal: Negative for abdominal distention, abdominal pain, constipation, diarrhea and nausea.  Endocrine: Negative for cold intolerance, heat intolerance, polydipsia, polyphagia and polyuria.       Diabetic. Poor control.  Genitourinary: Negative for dysuria, frequency, testicular pain and urgency.  Not incontinent  Musculoskeletal: Negative for arthralgias, back pain, gait problem, joint swelling, myalgias and neck pain.  Skin: Positive for wound (right foreleg). Negative for color change, pallor and rash.  Allergic/Immunologic: Negative.   Neurological: Positive for weakness (generalized). Negative for dizziness, tremors, seizures, syncope, speech difficulty, numbness and headaches.  Hematological: Negative.  Negative for adenopathy. Does not bruise/bleed easily.  Psychiatric/Behavioral: Negative for behavioral problems, confusion, decreased concentration, hallucinations and sleep disturbance. The  patient is not nervous/anxious.     Vitals:   11/29/15 1350  BP: 124/68  Pulse: 80  Resp: 16  Temp: 97.3 F (36.3 C)  TempSrc: Oral  SpO2: 96%  Weight: 158 lb 9.6 oz (71.9 kg)  Height: 5' 8"  (1.727 m)   Body mass index is 24.12 kg/m. Wt Readings from Last 3 Encounters:  11/29/15 158 lb 9.6 oz (71.9 kg)  11/23/15 156 lb (70.8 kg)  08/30/15 157 lb (71.2 kg)      Physical Exam  Constitutional: He is oriented to person, place, and time. He appears well-developed and well-nourished. No distress.  HENT:  Head: Normocephalic and atraumatic.  Right Ear: External ear normal.  Left Ear: External ear normal.  Nose: Nose normal.  Mouth/Throat: Oropharynx is clear and moist. No oropharyngeal exudate.  Loss of hearing.  Eyes: Conjunctivae and EOM are normal. Pupils are equal, round, and reactive to light.  Severe visual losses due to macular degeneration and retinal hemorrhages.  Neck: Normal range of motion. Neck supple. No JVD present. No tracheal deviation present. No thyromegaly present.  Cardiovascular: Normal rate, regular rhythm, normal heart sounds and intact distal pulses.  Exam reveals no gallop and no friction rub.   No murmur heard. Diminished DP and PT bilaterally.  Pulmonary/Chest: Effort normal and breath sounds normal. No respiratory distress. He has no wheezes. He has no rales. He exhibits no tenderness.  Abdominal: Soft. Bowel sounds are normal. He exhibits no distension and no mass. There is no tenderness. There is no guarding.  Musculoskeletal: Normal range of motion. He exhibits no edema or tenderness.  Pain in the right knee with movement. Mild crepitance. Very small effusion of the knee.  Lymphadenopathy:    He has no cervical adenopathy.  Neurological: He is alert and oriented to person, place, and time. He has normal reflexes. No cranial nerve deficit. Coordination normal.  Diminished sensation to monofilament testing.  Skin: Skin is warm and dry. No rash  noted. No erythema. No pallor.  Multiple SK.  Psychiatric: He has a normal mood and affect. His behavior is normal. Judgment and thought content normal.    Labs reviewed: Lab Summary Latest Ref Rng & Units 11/23/2015 08/24/2015 05/23/2015 02/25/2015  Hemoglobin 13.2 - 17.1 g/dL 13.7 (None) (None) 15.3  Hematocrit 38.5 - 50.0 % 40.3 (None) (None) 44.8  White count 3.8 - 10.8 K/uL 11.3(H) (None) (None) 9.0  Platelet count 140 - 400 K/uL 222 (None) (None) 175  Sodium 135 - 146 mmol/L 140 143 144 138  Potassium 3.5 - 5.3 mmol/L 4.8 4.2 4.8 4.3  Calcium 8.6 - 10.3 mg/dL 10.7(H) 10.6(H) 11.1(H) 11.1(H)  Phosphorus - (None) (None) (None) (None)  Creatinine 0.70 - 1.11 mg/dL 1.18(H) 1.07 0.87 1.05  AST 10 - 35 U/L 17 (None) (None) 24  Alk Phos 40 - 115 U/L 102 (None) (None) 98  Bilirubin 0.2 - 1.2 mg/dL 0.4 (None) (None) 0.4  Glucose 65 - 99 mg/dL 145(H) 37(LL) 99 125(H)  Cholesterol - (None) (None) (None) (None)  HDL cholesterol - (  None) (None) (None) (None)  Triglycerides - (None) (None) (None) (None)  LDL Direct - (None) (None) (None) (None)  LDL Calc - (None) (None) (None) (None)  Total protein 6.1 - 8.1 g/dL 6.2 (None) (None) 6.7  Albumin 3.6 - 5.1 g/dL 4.3 (None) (None) 4.4  Some recent data might be hidden   Lab Results  Component Value Date   TSH 1.64 11/23/2015   Lab Results  Component Value Date   BUN 12 11/23/2015   BUN 19 08/24/2015   BUN 15 05/23/2015   Lab Results  Component Value Date   HGBA1C 7.6 (H) 08/24/2015   HGBA1C 8.1 (H) 05/23/2015   HGBA1C 7.9 (H) 01/14/2015    Assessment/Plan  1. Weak Hopefully this will improve by next visit in 1 month  2. DM type 2, uncontrolled, with neuropathy ([place]) controlled  3. Essential hypertension controlled  4. Paroxysmal supraventricular tachycardia (HCC) Improved on metoprolol. May need additional studies such as 2D Echo or Holter, depending on how he feels at his next visit.

## 2015-12-03 ENCOUNTER — Other Ambulatory Visit: Payer: Self-pay | Admitting: Internal Medicine

## 2015-12-08 ENCOUNTER — Telehealth: Payer: Self-pay | Admitting: *Deleted

## 2015-12-08 NOTE — Telephone Encounter (Signed)
Received form from [employer] [phone] for [employer] [phone]) and Inserts [phone]). Form and last OV note placed for Dr. Nyoka Cowden to review and sign. Needs to be faxed back to [facility] Fax#: [phone] any questions to call [phone]

## 2015-12-13 ENCOUNTER — Encounter: Payer: Self-pay | Admitting: Internal Medicine

## 2015-12-21 DIAGNOSIS — M1711 Unilateral primary osteoarthritis, right knee: Secondary | ICD-10-CM | POA: Diagnosis not present

## 2015-12-26 DIAGNOSIS — H353233 Exudative age-related macular degeneration, bilateral, with inactive scar: Secondary | ICD-10-CM | POA: Diagnosis not present

## 2015-12-30 ENCOUNTER — Other Ambulatory Visit: Payer: Medicare Other

## 2016-01-03 ENCOUNTER — Ambulatory Visit: Payer: Medicare Other | Admitting: Internal Medicine

## 2016-01-06 ENCOUNTER — Other Ambulatory Visit: Payer: Medicare Other

## 2016-01-06 DIAGNOSIS — E1165 Type 2 diabetes mellitus with hyperglycemia: Principal | ICD-10-CM

## 2016-01-06 DIAGNOSIS — E785 Hyperlipidemia, unspecified: Secondary | ICD-10-CM

## 2016-01-06 DIAGNOSIS — I10 Essential (primary) hypertension: Secondary | ICD-10-CM | POA: Diagnosis not present

## 2016-01-06 DIAGNOSIS — E1140 Type 2 diabetes mellitus with diabetic neuropathy, unspecified: Secondary | ICD-10-CM

## 2016-01-06 DIAGNOSIS — IMO0002 Reserved for concepts with insufficient information to code with codable children: Secondary | ICD-10-CM

## 2016-01-06 LAB — COMPREHENSIVE METABOLIC PANEL
ALT: 15 U/L (ref 9–46)
AST: 16 U/L (ref 10–35)
Albumin: 4.3 g/dL (ref 3.6–5.1)
Alkaline Phosphatase: 97 U/L (ref 40–115)
BUN: 13 mg/dL (ref 7–25)
CO2: 25 mmol/L (ref 20–31)
Calcium: 10.7 mg/dL — ABNORMAL HIGH (ref 8.6–10.3)
Chloride: 110 mmol/L (ref 98–110)
Creat: 1.02 mg/dL (ref 0.70–1.11)
Glucose, Bld: 66 mg/dL (ref 65–99)
Potassium: 4.4 mmol/L (ref 3.5–5.3)
Sodium: 142 mmol/L (ref 135–146)
Total Bilirubin: 0.5 mg/dL (ref 0.2–1.2)
Total Protein: 6.3 g/dL (ref 6.1–8.1)

## 2016-01-06 LAB — LIPID PANEL
Cholesterol: 228 mg/dL — ABNORMAL HIGH (ref <200)
HDL: 44 mg/dL (ref >40)
LDL Cholesterol: 155 mg/dL — ABNORMAL HIGH (ref <100)
Total CHOL/HDL Ratio: 5.2 Ratio — ABNORMAL HIGH (ref <5.0)
Triglycerides: 147 mg/dL (ref <150)
VLDL: 29 mg/dL (ref <30)

## 2016-01-06 LAB — HEMOGLOBIN A1C
Hgb A1c MFr Bld: 7.4 % — ABNORMAL HIGH (ref <5.7)
Mean Plasma Glucose: 166 mg/dL

## 2016-01-18 ENCOUNTER — Encounter: Payer: Self-pay | Admitting: Internal Medicine

## 2016-01-18 ENCOUNTER — Ambulatory Visit (INDEPENDENT_AMBULATORY_CARE_PROVIDER_SITE_OTHER): Payer: Medicare Other | Admitting: Internal Medicine

## 2016-01-18 VITALS — BP 120/84 | HR 76 | Temp 97.9°F | Ht 68.0 in | Wt 161.0 lb

## 2016-01-18 DIAGNOSIS — I471 Supraventricular tachycardia, unspecified: Secondary | ICD-10-CM

## 2016-01-18 DIAGNOSIS — G8929 Other chronic pain: Secondary | ICD-10-CM

## 2016-01-18 DIAGNOSIS — R531 Weakness: Secondary | ICD-10-CM

## 2016-01-18 DIAGNOSIS — E1151 Type 2 diabetes mellitus with diabetic peripheral angiopathy without gangrene: Secondary | ICD-10-CM | POA: Diagnosis not present

## 2016-01-18 DIAGNOSIS — I10 Essential (primary) hypertension: Secondary | ICD-10-CM

## 2016-01-18 DIAGNOSIS — E785 Hyperlipidemia, unspecified: Secondary | ICD-10-CM | POA: Diagnosis not present

## 2016-01-18 DIAGNOSIS — M25561 Pain in right knee: Secondary | ICD-10-CM

## 2016-01-18 NOTE — Progress Notes (Signed)
Facility  [place]    Place of Service:   OFFICE    Allergies  Allergen Reactions  . Aspirin Other (See Comments)    Bothers stomach, thins blood   . Atorvastatin Other (See Comments)    Muscular pain and weakness  . Prednisone Other (See Comments)    Upset stomach   . Simvastatin Other (See Comments)    Muscular pain and weakness  . Sulfa Antibiotics Other (See Comments)    Upset stomach   . Metformin Hcl Nausea Only  . Voltaren [Diclofenac Sodium] Other (See Comments)    Chief Complaint  Patient presents with  . Medical Management of Chronic Issues    one month medication management weakness, tachycardia, blood sugar, blood pressure, review labs    HPI:  Last week had right shoulder problem where he dropped the sugar. It did not seem weak or painful, but it was discoordination of the muscles. Had no control for a while. Arm did not feel like it was there, but it was not numb. Was better after about 30 minutes. Has had previous rotator cuff surgery. Next day it seemed sore in the joint. No other neurologic symptoms.  Paroxysmal supraventricular tachycardia (HCC) - normal pulse rates since on metoprolol  Weak - improved overall  Essential hypertension - improved  DM (diabetes mellitus), type 2 with peripheral vascular complications (HCC) 0- improved, but still could be better  Hyperlipidemia, unspecified hyperlipidemia type - runs a high LDL, but does not tolerate cholesterol lowering meds.    Medications: Patient's Medications  New Prescriptions   No medications on file  Previous Medications   ACETAMINOPHEN (TYLENOL) 500 MG TABLET    Take 500 mg by mouth every 6 (six) hours as needed for pain. Take one tablet as needed for pain.   CHOLECALCIFEROL (VITAMIN D) 1000 UNITS TABLET    Take 2,000 Units by mouth daily. Take 2 capsules daily for vitamin D supplement   FAMOTIDINE (PEPCID) 20 MG TABLET    Take 20 mg by mouth as needed. Take one tablet as needed for reflux.   INSULIN GLARGINE (LANTUS SOLOSTAR) 100 UNIT/ML SOLOSTAR PEN    INJECT 40 UNITS AT BEDTIME FOR DIABETES.   INSULIN ISOPHANE & REGULAR HUMAN (HUMULIN 70/30 KWIKPEN) (70-30) 100 UNIT/ML PEN    Inject 20 units each mornig   INSULIN PEN NEEDLE (B-D ULTRAFINE III SHORT PEN) 31G X 8 MM MISC    Use twice daily with administration of insulin and humulin DX E11.40, E11.65   INSULIN PEN NEEDLE (B-D ULTRAFINE III SHORT PEN) 31G X 8 MM MISC    USE DAILY WITH ADMINISTRATION OF INSULIN DX E11.40, E11.65   METOPROLOL (LOPRESSOR) 50 MG TABLET    Take 1 tablet (50 mg total) by mouth 2 (two) times daily.   MULTIPLE VITAMIN (MULTIVITAMIN WITH MINERALS) TABS TABLET    Take 1 tablet by mouth daily.   NYSTATIN-TRIAMCINOLONE (MYCOLOG II) CREAM    APPLY TOPICALLY 4 TIMES DAY TO RASH   TAMSULOSIN (FLOMAX) 0.4 MG CAPS CAPSULE    TAKE 1 CAPSULE BY MOUTH  DAILY FOR PROSTATE  Modified Medications   No medications on file  Discontinued Medications   No medications on file    Review of Systems  Constitutional: Negative for activity change, appetite change, chills, diaphoresis, fatigue, fever and unexpected weight change.  HENT: Negative for congestion, ear pain, hearing loss, rhinorrhea, sore throat, tinnitus, trouble swallowing and voice change.   Eyes: Positive for visual disturbance (Severe diffuse  visual disturbances related to retinal disease.).       Corrective lenses  Respiratory: Positive for chest tightness and shortness of breath. Negative for cough, choking and wheezing.   Cardiovascular: Negative.  Negative for chest pain, palpitations and leg swelling.  Gastrointestinal: Negative for abdominal distention, abdominal pain, constipation, diarrhea and nausea.  Endocrine: Negative for cold intolerance, heat intolerance, polydipsia, polyphagia and polyuria.       Diabetic. Poor control.  Genitourinary: Negative for dysuria, frequency, testicular pain and urgency.       Not incontinent  Musculoskeletal: Negative for  arthralgias, back pain, gait problem, joint swelling, myalgias and neck pain.       Right knee discomfort. Seeing ortho.  Skin: Positive for wound (right foreleg). Negative for color change, pallor and rash.  Allergic/Immunologic: Negative.   Neurological: Positive for weakness (generalized). Negative for dizziness, tremors, seizures, syncope, speech difficulty, numbness and headaches.  Hematological: Negative.  Negative for adenopathy. Does not bruise/bleed easily.  Psychiatric/Behavioral: Negative for behavioral problems, confusion, decreased concentration, hallucinations and sleep disturbance. The patient is not nervous/anxious.     Vitals:   01/18/16 0942  BP: 120/84  Pulse: 76  Temp: 97.9 F (36.6 C)  TempSrc: Oral  SpO2: 96%  Weight: 161 lb (73 kg)  Height: 5' 8"  (1.727 m)   Body mass index is 24.48 kg/m. Wt Readings from Last 3 Encounters:  01/18/16 161 lb (73 kg)  11/29/15 158 lb 9.6 oz (71.9 kg)  11/23/15 156 lb (70.8 kg)      Physical Exam  Constitutional: He is oriented to person, place, and time. He appears well-developed and well-nourished. No distress.  HENT:  Head: Normocephalic and atraumatic.  Right Ear: External ear normal.  Left Ear: External ear normal.  Nose: Nose normal.  Mouth/Throat: Oropharynx is clear and moist. No oropharyngeal exudate.  Loss of hearing.  Eyes: Conjunctivae and EOM are normal. Pupils are equal, round, and reactive to light.  Severe visual losses due to macular degeneration and retinal hemorrhages.  Neck: Normal range of motion. Neck supple. No JVD present. No tracheal deviation present. No thyromegaly present.  Cardiovascular: Normal rate, regular rhythm, normal heart sounds and intact distal pulses.  Exam reveals no gallop and no friction rub.   No murmur heard. Diminished DP and PT bilaterally.  Pulmonary/Chest: Effort normal and breath sounds normal. No respiratory distress. He has no wheezes. He has no rales. He exhibits no  tenderness.  Abdominal: Soft. Bowel sounds are normal. He exhibits no distension and no mass. There is no tenderness. There is no guarding.  Musculoskeletal: Normal range of motion. He exhibits no edema or tenderness.  Pain in the right knee with movement. Mild crepitance. Very small effusion of the knee.  Lymphadenopathy:    He has no cervical adenopathy.  Neurological: He is alert and oriented to person, place, and time. He has normal reflexes. No cranial nerve deficit. Coordination normal.  Diminished sensation to monofilament testing.  Skin: Skin is warm and dry. No rash noted. No erythema. No pallor.  Multiple SK.  Psychiatric: He has a normal mood and affect. His behavior is normal. Judgment and thought content normal.    Labs reviewed: Lab Summary Latest Ref Rng & Units 01/06/2016 11/23/2015 08/24/2015  Hemoglobin 13.2 - 17.1 g/dL (None) 13.7 (None)  Hematocrit 38.5 - 50.0 % (None) 40.3 (None)  White count 3.8 - 10.8 K/uL (None) 11.3(H) (None)  Platelet count 140 - 400 K/uL (None) 222 (None)  Sodium 135 - 146  mmol/L 142 140 143  Potassium 3.5 - 5.3 mmol/L 4.4 4.8 4.2  Calcium 8.6 - 10.3 mg/dL 10.7(H) 10.7(H) 10.6(H)  Phosphorus - (None) (None) (None)  Creatinine 0.70 - 1.11 mg/dL 1.02 1.18(H) 1.07  AST 10 - 35 U/L 16 17 (None)  Alk Phos 40 - 115 U/L 97 102 (None)  Bilirubin 0.2 - 1.2 mg/dL 0.5 0.4 (None)  Glucose 65 - 99 mg/dL 66 145(H) 37(LL)  Cholesterol <200 mg/dL 228(H) (None) (None)  HDL cholesterol >40 mg/dL 44 (None) (None)  Triglycerides <150 mg/dL 147 (None) (None)  LDL Direct - (None) (None) (None)  LDL Calc <100 mg/dL 155(H) (None) (None)  Total protein 6.1 - 8.1 g/dL 6.3 6.2 (None)  Albumin 3.6 - 5.1 g/dL 4.3 4.3 (None)  Some recent data might be hidden   Lab Results  Component Value Date   TSH 1.64 11/23/2015   Lab Results  Component Value Date   BUN 13 01/06/2016   BUN 12 11/23/2015   BUN 19 08/24/2015   Lab Results  Component Value Date   HGBA1C 7.4  (H) 01/06/2016   HGBA1C 7.6 (H) 08/24/2015   HGBA1C 8.1 (H) 05/23/2015    Assessment/Plan  1. Paroxysmal supraventricular tachycardia (HCC) Better control with metoprolol  2. Weak improved  3. Essential hypertension - Comprehensive metabolic panel; Future  4. DM (diabetes mellitus), type 2 with peripheral vascular complications (HCC) Improving control - Hemoglobin A1c; Future - Comprehensive metabolic panel; Future - Microalbumin, urine; Future  5. Hyperlipidemia, unspecified hyperlipidemia type Stable. Statinintolerant  6. Chronic pain of right knee continnue with ortho appt.

## 2016-01-24 ENCOUNTER — Other Ambulatory Visit: Payer: Self-pay | Admitting: Internal Medicine

## 2016-01-30 DIAGNOSIS — J029 Acute pharyngitis, unspecified: Secondary | ICD-10-CM | POA: Diagnosis not present

## 2016-02-01 ENCOUNTER — Ambulatory Visit (INDEPENDENT_AMBULATORY_CARE_PROVIDER_SITE_OTHER): Payer: Medicare Other

## 2016-02-01 ENCOUNTER — Telehealth: Payer: Self-pay

## 2016-02-01 VITALS — BP 146/88 | HR 78 | Temp 97.8°F | Ht 68.0 in | Wt 160.0 lb

## 2016-02-01 DIAGNOSIS — Z0000 Encounter for general adult medical examination without abnormal findings: Secondary | ICD-10-CM

## 2016-02-01 DIAGNOSIS — E1151 Type 2 diabetes mellitus with diabetic peripheral angiopathy without gangrene: Secondary | ICD-10-CM

## 2016-02-01 MED ORDER — INSULIN PEN NEEDLE 31G X 8 MM MISC: 3 refills | Status: DC

## 2016-02-01 NOTE — Patient Instructions (Addendum)
[name] , Thank you for taking time to come for your Medicare Wellness Visit. I appreciate your ongoing commitment to your health goals. Please review the following plan we discussed and let me know if I can assist you in the future.   These are the goals we discussed: Goals      Patient Stated   . <enter goal here> (pt-stated)          Starting 02/01/16, I will maintain my current lifestyle.        This is a list of the screening recommended for you and due dates:  Health Maintenance  Topic Date Due  . Shingles Vaccine  01/22/2024*  . Urine Protein Check  05/22/2016  . Hemoglobin A1C  07/06/2016  . Complete foot exam   08/29/2016  . Eye exam for diabetics  08/30/2016  . Tetanus Vaccine  08/22/2020  . Flu Shot  Completed  . Pneumonia vaccines  Completed  *Topic was postponed. The date shown is not the original due date.  Preventive Care for Adults  A healthy lifestyle and preventive care can promote health and wellness. Preventive health guidelines for adults include the following key practices.  . A routine yearly physical is a good way to check with your health care provider about your health and preventive screening. It is a chance to share any concerns and updates on your health and to receive a thorough exam.  . Visit your dentist for a routine exam and preventive care every 6 months. Brush your teeth twice a day and floss once a day. Good oral hygiene prevents tooth decay and gum disease.  . The frequency of eye exams is based on your age, health, family medical history, use  of contact lenses, and other factors. Follow your health care provider's ecommendations for frequency of eye exams.  . Eat a healthy diet. Foods like vegetables, fruits, whole grains, low-fat dairy products, and lean protein foods contain the nutrients you need without too many calories. Decrease your intake of foods high in solid fats, added sugars, and salt. Eat the right amount of calories for  you. Get information about a proper diet from your health care provider, if necessary.  . Regular physical exercise is one of the most important things you can do for your health. Most adults should get at least 150 minutes of moderate-intensity exercise (any activity that increases your heart rate and causes you to sweat) each week. In addition, most adults need muscle-strengthening exercises on 2 or more days a week.  Silver Sneakers may be a benefit available to you. To determine eligibility, you may visit the website: www.silversneakers.com or contact program at (820)385-0891 Mon-Fri between 8AM-8PM.   . Maintain a healthy weight. The body mass index (BMI) is a screening tool to identify possible weight problems. It provides an estimate of body fat based on height and weight. Your health care provider can find your BMI and can help you achieve or maintain a healthy weight.   For adults 20 years and older: ? A BMI below 18.5 is considered underweight. ? A BMI of 18.5 to 24.9 is normal. ? A BMI of 25 to 29.9 is considered overweight. ? A BMI of 30 and above is considered obese.   . Maintain normal blood lipids and cholesterol levels by exercising and minimizing your intake of saturated fat. Eat a balanced diet with plenty of fruit and vegetables. Blood tests for lipids and cholesterol should begin at age 47 and be  repeated every 5 years. If your lipid or cholesterol levels are high, you are over 50, or you are at high risk for heart disease, you may need your cholesterol levels checked more frequently. Ongoing high lipid and cholesterol levels should be treated with medicines if diet and exercise are not working.  . If you smoke, find out from your health care provider how to quit. If you do not use tobacco, please do not start.  . If you choose to drink alcohol, please do not consume more than 2 drinks per day. One drink is considered to be 12 ounces (355 mL) of beer, 5 ounces (148 mL) of  wine, or 1.5 ounces (44 mL) of liquor.  . If you are 68-53 years old, ask your health care provider if you should take aspirin to prevent strokes.  . Use sunscreen. Apply sunscreen liberally and repeatedly throughout the day. You should seek shade when your shadow is shorter than you. Protect yourself by wearing long sleeves, pants, a wide-brimmed hat, and sunglasses year round, whenever you are outdoors.  . Once a month, do a whole body skin exam, using a mirror to look at the skin on your back. Tell your health care provider of new moles, moles that have irregular borders, moles that are larger than a pencil eraser, or moles that have changed in shape or color.

## 2016-02-01 NOTE — Progress Notes (Signed)
Quick Notes   Health Maintenance:  UTD on maintenance    Abnormal Screen: Unable to complete MMSE due to pt's being legally blind.     Patient Concerns:  None    Nurse Concerns:  None  I have reviewed the information entered by the Health Advisor. I was present in the office during the time of patient interaction and was available for consultation. I agree with the documentation and advice.  Viviann Spare Nyoka Cowden, MD

## 2016-02-01 NOTE — Progress Notes (Signed)
Subjective:   Micheal [name] is a 83 y.o. male who presents for an Initial Medicare Annual Wellness Visit.  Review of Systems   Cardiac Risk Factors include: advanced age (>66men, >65 women);diabetes mellitus;dyslipidemia;male gender;hypertension    Objective:    Today's Vitals   02/01/16 1056  BP: (!) 146/88  Pulse: 78  Temp: 97.8 F (36.6 C)  TempSrc: Oral  SpO2: 97%  Weight: 160 lb (72.6 kg)  Height: 5\' 8"  (1.727 m)  PainSc: 0-No pain   Body mass index is 24.33 kg/m.  Current Medications (verified) Outpatient Encounter Prescriptions as of 02/01/2016  Medication Sig  . acetaminophen (TYLENOL) 500 MG tablet Take 500 mg by mouth every 6 (six) hours as needed for pain. Take one tablet as needed for pain.  . cholecalciferol (VITAMIN D) 1000 UNITS tablet Take 2,000 Units by mouth daily. Take 2 capsules daily for vitamin D supplement  . docusate sodium (EQUATE STOOL SOFTENER) 100 MG capsule Take 100 mg by mouth daily.  . famotidine (PEPCID) 20 MG tablet Take 20 mg by mouth as needed. Take one tablet as needed for reflux.  . Insulin Glargine (LANTUS SOLOSTAR) 100 UNIT/ML Solostar Pen INJECT 40 UNITS AT BEDTIME FOR DIABETES.  . Insulin Isophane & Regular Human (HUMULIN 70/30 KWIKPEN) (70-30) 100 UNIT/ML PEN Inject 20 units each mornig  . Insulin Pen Needle (B-D ULTRAFINE III SHORT PEN) 31G X 8 MM MISC Use two needles once daily for insulin injections.  . metoprolol (LOPRESSOR) 50 MG tablet Take 1 tablet (50 mg total) by mouth 2 (two) times daily.  . Multiple Vitamin (MULTIVITAMIN WITH MINERALS) TABS tablet Take 1 tablet by mouth daily.  Marland Kitchen nystatin-triamcinolone (MYCOLOG II) cream APPLY TOPICALLY 4 TIMES DAY TO RASH  . tamsulosin (FLOMAX) 0.4 MG CAPS capsule TAKE 1 CAPSULE BY MOUTH  DAILY FOR PROSTATE  . [DISCONTINUED] B-D ULTRAFINE III SHORT PEN 31G X 8 MM MISC USE DAILY WITH  ADMINISTRATION OF INSULIN   No facility-administered encounter medications on file as of 02/01/2016.       Allergies (verified) Aspirin; Atorvastatin; Prednisone; Simvastatin; Sulfa antibiotics; Metformin hcl; and Voltaren [diclofenac sodium]   History: Past Medical History:  Diagnosis Date  . Allergic rhinitis, cause unspecified   . Carpal tunnel syndrome   . Cervical spondylosis without myelopathy   . Cervical spondylosis without myelopathy   . Cervicalgia   . Diverticulosis of colon (without mention of hemorrhage)   . Esophageal reflux   . Hypertrophy of prostate with urinary obstruction and other lower urinary tract symptoms (LUTS)   . Insomnia, unspecified   . Internal hemorrhoids without mention of complication   . Irritable bowel syndrome   . Macular degeneration (senile) of retina, unspecified   . Malignant neoplasm of prostate ([place])   . Neurogenic bladder, NOS   . Nonspecific (abnormal) findings on radiological and other examination of gastrointestinal tract   . Orthostatic hypotension   . Osteoarthrosis, unspecified whether generalized or localized, unspecified site   . Other and unspecified hyperlipidemia   . Other malaise and fatigue   . Other specified disorder of rectum and anus    Radiation Proctitus  . Paroxysmal supraventricular tachycardia ([place])   . Restless legs syndrome (RLS)   . Retinal detachment with retinal defect, unspecified   . Right bundle branch block    Incomplete  . Spermatocele    Right  . Trigger finger (acquired)   . Type II or unspecified type diabetes mellitus without mention of complication, not stated  as uncontrolled   . Type II or unspecified type diabetes mellitus without mention of complication, uncontrolled   . Unspecified essential hypertension   . Vitamin D deficiency    Past Surgical History:  Procedure Laterality Date  . Angiolipoma  1980   Right arm  . APPENDECTOMY  1951  . CHOLECYSTECTOMY  01/1994  . COLONOSCOPY  04/11/2010  . COLONOSCOPY W/ POLYPECTOMY  2005   Removed 2 (two) polyps  . DUPUYTREN CONTRACTURE RELEASE  Bilateral 1989  . EYE SURGERY  2006   Left  . EYE SURGERY  2006   Retina reattachment, right  . EYE SURGERY  02/1994   Cataract surgery, right  . EYE SURGERY  01/1995   Cataract surgery, left  . KNEE SURGERY  1979  . POLYPECTOMY  1955   Vocal cord  . RETINAL DETACHMENT SURGERY Right 2005  . SHOULDER ARTHROSCOPY Right 03/22/15   [place]  . TENDON RELEASE  01/1997   Fourth finger, left  . TRANSURETHRAL RESECTION OF PROSTATE  08/1989  . VITRECTOMY Left 04/23/2013   [facility]   Family History  Problem Relation Age of Onset  . Depression Mother   . Diabetes Mother   . Mental illness Mother     OCD  . Hypertension Sister   . Diabetes Daughter   . Hyperlipidemia Daughter    Social History   Occupational History  . Not on file.   Social History Main Topics  . Smoking status: Never Smoker  . Smokeless tobacco: Never Used  . Alcohol use No  . Drug use: No  . Sexual activity: No   Tobacco Counseling Counseling given: No   Activities of Daily Living In your present state of health, do you have any difficulty performing the following activities: 02/01/2016  Hearing? Y  Vision? Y  Difficulty concentrating or making decisions? Y  Walking or climbing stairs? Y  Dressing or bathing? N  Doing errands, shopping? Y  Preparing Food and eating ? Y  Using the Toilet? N  In the past six months, have you accidently leaked urine? Y  Do you have problems with loss of bowel control? N  Managing your Medications? N  Managing your Finances? Y  Housekeeping or managing your Housekeeping? Y  Some recent data might be hidden    Immunizations and Health Maintenance Immunization History  Administered Date(s) Administered  . Influenza,inj,Quad PF,36+ Mos 10/29/2012, 10/28/2013  . Influenza-Unspecified 10/25/2010, 11/07/2011, 10/07/2015  . Pneumococcal Conjugate-13 05/18/2014  . Pneumococcal Polysaccharide-23 01/23/2007  . Td 08/23/2010   Health Maintenance Due  Topic Date Due    . OPHTHALMOLOGY EXAM  02/02/1938  . ZOSTAVAX  02/01/1990    Patient Care Team: Estill Dooms, MD as PCP - General (Internal Medicine) Netta Cedars, MD as Consulting Physician (Orthopedic Surgery) Jasmine Awe, MD as Referring Physician (Ophthalmology) Rana Snare, MD as Consulting Physician (Urology)  Indicate any recent Medical Services you may have received from other than [facility] providers in the past year (date may be approximate).    Assessment:   This is a routine wellness examination for [place].   Hearing/Vision screen Hearing Screening Comments: Pt has bilateral hearing aides; last checked, 6 mos ago.  Vision Screening Comments: Pt has macular degeneration and is being followed by [place] is legally blind in both eyes.   Dietary issues and exercise activities discussed: Current Exercise Habits: The patient does not participate in regular exercise at present (Pt does yard work and works around [employer]. )  Goals      Patient Stated   . <enter goal here> (pt-stated)          Starting 02/01/16, I will maintain my current lifestyle.       Depression Screen PHQ 2/9 Scores 02/01/2016 08/30/2015 05/25/2015 02/24/2014  PHQ - 2 Score 0 0 1 0    Fall Risk Fall Risk  02/01/2016 11/29/2015 08/30/2015 05/25/2015 02/25/2015  Falls in the past year? No No No No No  Risk for fall due to : Impaired balance/gait - - - -    Cognitive Function: MMSE - Mini Mental State Exam 02/01/2016  Not completed: Unable to complete  Orientation to time 5  Orientation to Place 5  Registration 3  Attention/ Calculation 3  Recall 0  Language- name 2 objects 2  Language- repeat 1  Language- follow 3 step command 0  Language- read & follow direction 0  Write a sentence 0  Copy design 0  Total score 19        Screening Tests Health Maintenance  Topic Date Due  . OPHTHALMOLOGY EXAM  02[birth year]02/1938  . ZOSTAVAX  02/01/1990  . URINE MICROALBUMIN  05/22/2016  . HEMOGLOBIN A1C  07/06/2016   . FOOT EXAM  08/29/2016  . TETANUS/TDAP  08/22/2020  . INFLUENZA VACCINE  Completed  . PNA vac Low Risk Adult  Completed        Plan:    I have personally reviewed and addressed the Medicare Annual Wellness questionnaire and have noted the following in the patient's chart:  A. Medical and social history B. Use of alcohol, tobacco or illicit drugs  C. Current medications and supplements D. Functional ability and status E.  Nutritional status F.  Physical activity G. Advance directives H. List of other physicians I.  Hospitalizations, surgeries, and ER visits in previous 12 months J.  [place] to include hearing, vision, cognitive, depression L. Referrals and appointments - none  In addition, I have reviewed and discussed with patient certain preventive protocols, quality metrics, and best practice recommendations. A written personalized care plan for preventive services as well as general preventive health recommendations were provided to patient.  See attached scanned questionnaire for additional information.   Signed,   Allyn Kenner, LPN Health Advisor     I have reviewed the information entered by the Health Advisor. I was present in the office during the time of patient interaction and was available for consultation. I agree with the documentation and advice.  Viviann Spare Nyoka Cowden, MD

## 2016-02-01 NOTE — Telephone Encounter (Signed)
Mrs. Livers stopped in hallway to ask if we could call in needles for [facility].  On 01/25/16 only 40 was called in and prior to that only 20. Javonne takes injection once a day of Human and Lantus. He uses 2 needles daily. Needs 90 day supply. Checked with Dr. Nyoka Cowden he ok 180 needles. Faxed.

## 2016-02-02 DIAGNOSIS — M1711 Unilateral primary osteoarthritis, right knee: Secondary | ICD-10-CM | POA: Diagnosis not present

## 2016-02-10 ENCOUNTER — Telehealth: Payer: Self-pay | Admitting: *Deleted

## 2016-02-10 NOTE — Telephone Encounter (Signed)
This is not due to the metoprolol. He has had issues with this in the past. He should start taking his Pepcid (famotidine) twice daily to prevent the heartburn.

## 2016-02-10 NOTE — Telephone Encounter (Signed)
Patient wife called and stated that they had concerns with patient's Metoprolol. Stated that patient is having a lot of heartburn with hoarseness. Wonders if it is coming from the Metoprolol 50mg  that you increased to twice daily. Please Advise.

## 2016-02-10 NOTE — Telephone Encounter (Signed)
Patient's wife notified and agreed.

## 2016-02-17 ENCOUNTER — Other Ambulatory Visit: Payer: Self-pay | Admitting: *Deleted

## 2016-02-17 DIAGNOSIS — I471 Supraventricular tachycardia: Secondary | ICD-10-CM

## 2016-02-17 MED ORDER — METOPROLOL TARTRATE 50 MG PO TABS: 50.0000 mg | ORAL_TABLET | Freq: Two times a day (BID) | ORAL | 3 refills | Status: DC

## 2016-02-17 NOTE — Telephone Encounter (Signed)
Patient requested Rx to be sent to [facility].  

## 2016-03-15 DIAGNOSIS — M1711 Unilateral primary osteoarthritis, right knee: Secondary | ICD-10-CM | POA: Diagnosis not present

## 2016-04-23 DIAGNOSIS — H353233 Exudative age-related macular degeneration, bilateral, with inactive scar: Secondary | ICD-10-CM | POA: Diagnosis not present

## 2016-05-06 DIAGNOSIS — R05 Cough: Secondary | ICD-10-CM | POA: Diagnosis not present

## 2016-05-06 DIAGNOSIS — M549 Dorsalgia, unspecified: Secondary | ICD-10-CM | POA: Diagnosis not present

## 2016-05-06 DIAGNOSIS — R0600 Dyspnea, unspecified: Secondary | ICD-10-CM | POA: Diagnosis not present

## 2016-05-21 ENCOUNTER — Other Ambulatory Visit: Payer: Medicare Other

## 2016-05-23 ENCOUNTER — Ambulatory Visit: Payer: Medicare Other | Admitting: Internal Medicine

## 2016-05-24 DIAGNOSIS — M545 Low back pain: Secondary | ICD-10-CM | POA: Diagnosis not present

## 2016-06-05 ENCOUNTER — Other Ambulatory Visit: Payer: Medicare Other

## 2016-06-05 DIAGNOSIS — I10 Essential (primary) hypertension: Secondary | ICD-10-CM

## 2016-06-05 DIAGNOSIS — E1151 Type 2 diabetes mellitus with diabetic peripheral angiopathy without gangrene: Secondary | ICD-10-CM | POA: Diagnosis not present

## 2016-06-05 LAB — COMPREHENSIVE METABOLIC PANEL
ALT: 11 U/L (ref 9–46)
AST: 16 U/L (ref 10–35)
Albumin: 4.2 g/dL (ref 3.6–5.1)
Alkaline Phosphatase: 87 U/L (ref 40–115)
BUN: 17 mg/dL (ref 7–25)
CO2: 18 mmol/L — ABNORMAL LOW (ref 20–31)
Calcium: 10.8 mg/dL — ABNORMAL HIGH (ref 8.6–10.3)
Chloride: 109 mmol/L (ref 98–110)
Creat: 1.01 mg/dL (ref 0.70–1.11)
Glucose, Bld: 53 mg/dL — ABNORMAL LOW (ref 65–99)
Potassium: 4.1 mmol/L (ref 3.5–5.3)
Sodium: 143 mmol/L (ref 135–146)
Total Bilirubin: 0.5 mg/dL (ref 0.2–1.2)
Total Protein: 6.3 g/dL (ref 6.1–8.1)

## 2016-06-06 LAB — HEMOGLOBIN A1C
Hgb A1c MFr Bld: 7.8 % — ABNORMAL HIGH (ref <5.7)
Mean Plasma Glucose: 177 mg/dL

## 2016-06-06 LAB — MICROALBUMIN, URINE: Microalb, Ur: 1.1 mg/dL

## 2016-06-07 ENCOUNTER — Encounter: Payer: Self-pay | Admitting: Internal Medicine

## 2016-06-07 ENCOUNTER — Ambulatory Visit (INDEPENDENT_AMBULATORY_CARE_PROVIDER_SITE_OTHER): Payer: Medicare Other | Admitting: Internal Medicine

## 2016-06-07 VITALS — BP 120/70 | HR 56 | Temp 97.4°F | Ht 68.0 in | Wt 160.0 lb

## 2016-06-07 DIAGNOSIS — G729 Myopathy, unspecified: Secondary | ICD-10-CM

## 2016-06-07 DIAGNOSIS — G8929 Other chronic pain: Secondary | ICD-10-CM | POA: Diagnosis not present

## 2016-06-07 DIAGNOSIS — H353232 Exudative age-related macular degeneration, bilateral, with inactive choroidal neovascularization: Secondary | ICD-10-CM | POA: Diagnosis not present

## 2016-06-07 DIAGNOSIS — M545 Low back pain, unspecified: Secondary | ICD-10-CM

## 2016-06-07 DIAGNOSIS — E1151 Type 2 diabetes mellitus with diabetic peripheral angiopathy without gangrene: Secondary | ICD-10-CM | POA: Diagnosis not present

## 2016-06-07 DIAGNOSIS — C61 Malignant neoplasm of prostate: Secondary | ICD-10-CM

## 2016-06-07 MED ORDER — ZOSTER VAC RECOMB ADJUVANTED 50 MCG/0.5ML IM SUSR: 0.5000 mL | Freq: Once | INTRAMUSCULAR | 1 refills | Status: AC

## 2016-06-07 NOTE — Progress Notes (Signed)
Location:  [facility] clinic Provider:  Karell Tukes L. Mariea Clonts, D.O., C.M.D.  Code Status: full code Goals of Care:  Advanced Directives 06/07/2016  Does Patient Have a Medical Advance Directive? Yes  Type of Paramedic of [place];Living will  Does patient want to make changes to medical advance directive? -  Copy of [place] in Chart? No - copy requested  Would patient like information on creating a medical advance directive? -  we don't have a copy  Chief Complaint  Patient presents with  . Medical Management of Chronic Issues    4 mth follow-up ( former Dr. Nyoka Cowden pt)    HPI: Patient is a 83 y.o. male seen today for medical management of chronic diseases.  He is establishing with me after following with Dr. Nyoka Cowden for years (he has been seeing him since he took over for Dr. Manley Mason).  He has a h/o cervical spondylosis with myelopathy, BPH with LUTS, diverticulosis, insomnia, macular degeneration, neurogenic bladder, PSVT, restless legs, DMII, HTN and others.    DMII:  Managed with insulin therapy--lantus.   Lab Results  Component Value Date   HGBA1C 7.8 (H) 06/05/2016  Not on asa (allergy), statin (intolerant with myalgias), ace/arb.  Sugars doing better lately.  tachycardia:  Improved with lopressor.   BPH:  On flomax.  Had prostate cancer 6-7 years ago. Goes annually to see urology, Dr. Risa Grill.  Macular degeneration:  Says vision is blurry.  Can watch tv, but cannot read.  Had injections at [facility] for 10-12 years.  Eyes were not keeping the medication.  He's been stable for a year and 1/2 with no new bleeding.  His wife has to do all of the driving.  They had the flu shot at the drug store there.    Sees [facility] for injections.  He started out going to Dr. Veverly Fells, et al.  He was ready to schedule him for a TKA, and he started with injections.  Right knee  Had a job squatting, stooping, lifting in a printing press.  Had arthroscopic  type of procedure before they did it that way.  There was still some space between the bones.    Last year he had a right rotator cuff replacement.  Left knee is also hurting.  He can't get up out of a chair.  It's a huge effort and requires his arms.  Has to drop down into the chair.  He uses a cane to ambulate.  He was going to have a series of shots across his spine with Dr. Nelva Bush and Dr. Veverly Fells was thinking maybe his back was the problem.  This is to begin Thursday.  Once he's on his feet, he's able to walk his dog 3/4 of a mile. When he's laying in bed, they have to be just the right position or they hurt.    Has advance directives, but none are on file in epic.    Has spring allergies, went to med center in [place] where he was treated with abx when affected both lungs.  Seems to be better now at last after 2-3 months.  Last week, he spit up a wad of phlegm, it felt good and he's been better since.  Watery nose is back since then.  Takes claritin daily.    Past Medical History:  Diagnosis Date  . Allergic rhinitis, cause unspecified   . Carpal tunnel syndrome   . Cervical spondylosis without myelopathy   . Cervical spondylosis  without myelopathy   . Cervicalgia   . Diverticulosis of colon (without mention of hemorrhage)   . Esophageal reflux   . Hypertrophy of prostate with urinary obstruction and other lower urinary tract symptoms (LUTS)   . Insomnia, unspecified   . Internal hemorrhoids without mention of complication   . Irritable bowel syndrome   . Macular degeneration (senile) of retina, unspecified   . Malignant neoplasm of prostate ([place])   . Neurogenic bladder, NOS   . Nonspecific (abnormal) findings on radiological and other examination of gastrointestinal tract   . Orthostatic hypotension   . Osteoarthrosis, unspecified whether generalized or localized, unspecified site   . Other and unspecified hyperlipidemia   . Other malaise and fatigue   . Other specified disorder of  rectum and anus    Radiation Proctitus  . Paroxysmal supraventricular tachycardia ([place])   . Restless legs syndrome (RLS)   . Retinal detachment with retinal defect, unspecified   . Right bundle branch block    Incomplete  . Spermatocele    Right  . Trigger finger (acquired)   . Type II or unspecified type diabetes mellitus without mention of complication, not stated as uncontrolled   . Type II or unspecified type diabetes mellitus without mention of complication, uncontrolled   . Unspecified essential hypertension   . Vitamin D deficiency     Past Surgical History:  Procedure Laterality Date  . Angiolipoma  1980   Right arm  . APPENDECTOMY  1951  . CHOLECYSTECTOMY  01/1994  . COLONOSCOPY  04/11/2010  . COLONOSCOPY W/ POLYPECTOMY  2005   Removed 2 (two) polyps  . DUPUYTREN CONTRACTURE RELEASE Bilateral 1989  . EYE SURGERY  2006   Left  . EYE SURGERY  2006   Retina reattachment, right  . EYE SURGERY  02/1994   Cataract surgery, right  . EYE SURGERY  01/1995   Cataract surgery, left  . KNEE SURGERY  1979  . POLYPECTOMY  1955   Vocal cord  . RETINAL DETACHMENT SURGERY Right 2005  . SHOULDER ARTHROSCOPY Right 03/22/15   [facility]  . TENDON RELEASE  01/1997   Fourth finger, left  . TRANSURETHRAL RESECTION OF PROSTATE  08/1989  . VITRECTOMY Left 04/23/2013   [facility]    Allergies  Allergen Reactions  . Aspirin Other (See Comments)    Bothers stomach, thins blood   . Atorvastatin Other (See Comments)    Muscular pain and weakness  . Prednisone Other (See Comments)    Upset stomach   . Simvastatin Other (See Comments)    Muscular pain and weakness  . Sulfa Antibiotics Other (See Comments)    Upset stomach   . Metformin Hcl Nausea Only  . Voltaren [Diclofenac Sodium] Other (See Comments)    Allergies as of 06/07/2016      Reactions   Aspirin Other (See Comments)   Bothers stomach, thins blood    Atorvastatin Other (See Comments)   Muscular pain and weakness    Prednisone Other (See Comments)   Upset stomach    Simvastatin Other (See Comments)   Muscular pain and weakness   Sulfa Antibiotics Other (See Comments)   Upset stomach    Metformin Hcl Nausea Only   Voltaren [diclofenac Sodium] Other (See Comments)      Medication List       Accurate as of 06/07/16  1:15 PM. Always use your most recent med list.          acetaminophen 500 MG tablet  Commonly known as:  TYLENOL Take 500 mg by mouth every 6 (six) hours as needed for pain. Take one tablet as needed for pain.   cholecalciferol 1000 units tablet Commonly known as:  VITAMIN D Take 2,000 Units by mouth daily. Take 2 capsules daily for vitamin D supplement   EQUATE STOOL SOFTENER 100 MG capsule Generic drug:  docusate sodium Take 100 mg by mouth daily.   famotidine 20 MG tablet Commonly known as:  PEPCID Take 20 mg by mouth as needed. Take one tablet as needed for reflux.   Insulin Glargine 100 UNIT/ML Solostar Pen Commonly known as:  LANTUS SOLOSTAR INJECT 40 UNITS AT BEDTIME FOR DIABETES.   Insulin Isophane & Regular Human (70-30) 100 UNIT/ML PEN Commonly known as:  HUMULIN 70/30 KWIKPEN Inject 20 units each mornig   Insulin Pen Needle 31G X 8 MM Misc Commonly known as:  B-D ULTRAFINE III SHORT PEN Use two needles once daily for insulin injections.   metoprolol tartrate 50 MG tablet Commonly known as:  LOPRESSOR Take 1 tablet (50 mg total) by mouth 2 (two) times daily.   multivitamin with minerals Tabs tablet Take 1 tablet by mouth daily.   tamsulosin 0.4 MG Caps capsule Commonly known as:  FLOMAX TAKE 1 CAPSULE BY MOUTH  DAILY FOR PROSTATE       Review of Systems:  Review of Systems  Constitutional: Positive for malaise/fatigue. Negative for chills and fever.  HENT: Positive for congestion and hearing loss.   Eyes: Positive for blurred vision.  Respiratory: Positive for cough. Negative for shortness of breath.   Cardiovascular: Negative for chest pain and  palpitations.  Gastrointestinal: Negative for abdominal pain, blood in stool, constipation and melena.  Genitourinary: Negative for dysuria.       BPH  Musculoskeletal: Positive for joint pain. Negative for falls.  Skin: Negative for itching and rash.  Neurological: Positive for weakness. Negative for dizziness and loss of consciousness.  Psychiatric/Behavioral: Negative for memory loss. The patient is nervous/anxious.     Health Maintenance  Topic Date Due  . INFLUENZA VACCINE  08/22/2016  . FOOT EXAM  08/29/2016  . OPHTHALMOLOGY EXAM  08/30/2016  . HEMOGLOBIN A1C  12/06/2016  . URINE MICROALBUMIN  06/05/2017  . TETANUS/TDAP  08/22/2020  . PNA vac Low Risk Adult  Completed    Physical Exam: Vitals:   06/07/16 1307  Weight: 160 lb (72.6 kg)  Height: 5\' 8"  (1.727 m)   Body mass index is 24.33 kg/m. Physical Exam  Constitutional: He is oriented to person, place, and time. He appears well-developed and well-nourished. No distress.  Eyes:  Poor vision  Cardiovascular: Normal rate, regular rhythm, normal heart sounds and intact distal pulses.   Pulmonary/Chest: Effort normal and breath sounds normal.  Abdominal: Soft. Bowel sounds are normal.  Musculoskeletal:  Can barely get up out of chair even with use of arms, this is new; does have tenderness of bilateral knees, right greater than left and right shoulder  Neurological: He is alert and oriented to person, place, and time.  Skin: Skin is warm and dry.  Psychiatric: He has a normal mood and affect.  Very talkative and friendly gentleman    Labs reviewed: Basic Metabolic Panel:  Recent Labs  11/23/15 1650 01/06/16 0832 06/05/16 0839  NA 140 142 143  K 4.8 4.4 4.1  CL 107 110 109  CO2 24 25 18*  GLUCOSE 145* 66 53*  BUN 12 13 17   CREATININE 1.18* 1.02 1.01  CALCIUM 10.7* 10.7* 10.8*  TSH 1.64  --   --    Liver Function Tests:  Recent Labs  11/23/15 1650 01/06/16 0832 06/05/16 0839  AST 17 16 16   ALT 13  15 11   ALKPHOS 102 97 87  BILITOT 0.4 0.5 0.5  PROT 6.2 6.3 6.3  ALBUMIN 4.3 4.3 4.2   No results for input(s): LIPASE, AMYLASE in the last 8760 hours. No results for input(s): AMMONIA in the last 8760 hours. CBC:  Recent Labs  11/23/15 1650  WBC 11.3*  NEUTROABS 8,588*  HGB 13.7  HCT 40.3  MCV 88.8  PLT 222   Lipid Panel:  Recent Labs  01/06/16 0832  CHOL 228*  HDL 44  LDLCALC 155*  TRIG 147  CHOLHDL 5.2*   Lab Results  Component Value Date   HGBA1C 7.8 (H) 06/05/2016    Assessment/Plan 1. Myopathy -new onset, ? Due to hypercalcemia vs. Another etiology - Calcium, ionized - PTH, Intact and Calcium - Ambulatory referral to Physical Therapy  2. Hypercalcemia - needs w/u: - Calcium, ionized - PTH, Intact and Calcium - Ambulatory referral to Physical Therapy - COMPLETE METABOLIC PANEL WITH GFR; Future  3. DM (diabetes mellitus), type 2 with peripheral vascular complications (HCC) -cont current insulin regimen, f/u labs before next visit - COMPLETE METABOLIC PANEL WITH GFR; Future - Hemoglobin A1c; Future - Lipid panel; Future  4. Malignant neoplasm of prostate ([place]) -6-7 years ago, on flomax, follows with Dr. Risa Grill, had lupron  5. Exudative age-related macular degeneration of both eyes with inactive choroidal neovascularization ([place]) -along with prior retinal detachment, vision very poor and cannot drive, his wife drives him around  6. Chronic midline low back pain without sciatica -ongoing, cont tylenol every 8 hrs as needed and try to stay active to prevent stiffening up  Labs/tests ordered:   Orders Placed This Encounter  Procedures  . Calcium, ionized  . PTH, Intact and Calcium  . COMPLETE METABOLIC PANEL WITH GFR    Standing Status:   Future    Standing Expiration Date:   12/08/2016  . Hemoglobin A1c    Standing Status:   Future    Standing Expiration Date:   12/08/2016  . Lipid panel    Standing Status:   Future    Standing Expiration  Date:   12/08/2016  . Ambulatory referral to Physical Therapy    Referral Priority:   Routine    Referral Type:   Physical Medicine    Referral Reason:   Specialty Services Required    Requested Specialty:   Physical Therapy    Number of Visits Requested:   1    Next appt: 09/06/2016   Lezlie Ritchey L. Amery Vandenbos, D.O. [facility] [address], [place] [address] Cell Phone (Mon-Fri 8am-5pm):  [phone] On Call:  [phone] & follow prompts after 5pm & weekends Office Phone:  [phone] Office Fax:  [phone]

## 2016-06-08 ENCOUNTER — Telehealth: Payer: Self-pay

## 2016-06-08 LAB — PTH, INTACT AND CALCIUM
Calcium: 10.8 mg/dL — ABNORMAL HIGH (ref 8.6–10.3)
PTH: 108 pg/mL — ABNORMAL HIGH (ref 14–64)

## 2016-06-08 LAB — CALCIUM, IONIZED: Calcium, Ion: 6.1 mg/dL — ABNORMAL HIGH (ref 4.8–5.6)

## 2016-06-08 NOTE — Telephone Encounter (Signed)
Patient's wife called after receiving a call about Physical Therapy. Mr.Dirocco would like Physical Therapy set up in [place] vs [place].

## 2016-06-13 DIAGNOSIS — M545 Low back pain: Secondary | ICD-10-CM | POA: Diagnosis not present

## 2016-06-13 NOTE — Telephone Encounter (Signed)
Spoke with wife this afternoon, she has a specific place she would like for husband to go but has no information for me, She said she couldn't remember the name. She is going to get the information and give me a call back. I informed her that I would be out of the office the rest of the week and would return on Tuesday but she was welcome to leave the information on my voicemail.

## 2016-06-20 ENCOUNTER — Ambulatory Visit: Payer: Medicare Other

## 2016-06-20 DIAGNOSIS — G729 Myopathy, unspecified: Secondary | ICD-10-CM | POA: Diagnosis not present

## 2016-06-20 DIAGNOSIS — M545 Low back pain: Secondary | ICD-10-CM | POA: Diagnosis not present

## 2016-06-22 ENCOUNTER — Other Ambulatory Visit: Payer: Self-pay | Admitting: Internal Medicine

## 2016-06-22 DIAGNOSIS — IMO0002 Reserved for concepts with insufficient information to code with codable children: Secondary | ICD-10-CM

## 2016-06-22 DIAGNOSIS — E1140 Type 2 diabetes mellitus with diabetic neuropathy, unspecified: Secondary | ICD-10-CM

## 2016-06-22 DIAGNOSIS — E1165 Type 2 diabetes mellitus with hyperglycemia: Principal | ICD-10-CM

## 2016-06-25 ENCOUNTER — Encounter: Payer: Self-pay | Admitting: *Deleted

## 2016-06-25 DIAGNOSIS — M545 Low back pain: Secondary | ICD-10-CM | POA: Diagnosis not present

## 2016-06-25 DIAGNOSIS — G729 Myopathy, unspecified: Secondary | ICD-10-CM | POA: Diagnosis not present

## 2016-06-26 ENCOUNTER — Other Ambulatory Visit: Payer: Self-pay | Admitting: Internal Medicine

## 2016-06-26 DIAGNOSIS — E210 Primary hyperparathyroidism: Secondary | ICD-10-CM

## 2016-06-26 DIAGNOSIS — C61 Malignant neoplasm of prostate: Secondary | ICD-10-CM

## 2016-06-27 DIAGNOSIS — M1711 Unilateral primary osteoarthritis, right knee: Secondary | ICD-10-CM | POA: Diagnosis not present

## 2016-06-28 DIAGNOSIS — G729 Myopathy, unspecified: Secondary | ICD-10-CM | POA: Diagnosis not present

## 2016-06-28 DIAGNOSIS — M545 Low back pain: Secondary | ICD-10-CM | POA: Diagnosis not present

## 2016-07-02 DIAGNOSIS — M545 Low back pain: Secondary | ICD-10-CM | POA: Diagnosis not present

## 2016-07-02 DIAGNOSIS — G729 Myopathy, unspecified: Secondary | ICD-10-CM | POA: Diagnosis not present

## 2016-07-05 ENCOUNTER — Telehealth: Payer: Self-pay | Admitting: Internal Medicine

## 2016-07-05 NOTE — Telephone Encounter (Signed)
Checking on status of referral.  Thank you,  -LL

## 2016-07-05 NOTE — Telephone Encounter (Signed)
Patient is scheduled.pb

## 2016-07-11 DIAGNOSIS — M545 Low back pain: Secondary | ICD-10-CM | POA: Diagnosis not present

## 2016-07-20 ENCOUNTER — Encounter: Payer: Self-pay | Admitting: Endocrinology

## 2016-07-20 ENCOUNTER — Ambulatory Visit (INDEPENDENT_AMBULATORY_CARE_PROVIDER_SITE_OTHER): Payer: Medicare Other | Admitting: Endocrinology

## 2016-07-20 ENCOUNTER — Telehealth: Payer: Self-pay

## 2016-07-20 DIAGNOSIS — E213 Hyperparathyroidism, unspecified: Secondary | ICD-10-CM

## 2016-07-20 DIAGNOSIS — E210 Primary hyperparathyroidism: Secondary | ICD-10-CM | POA: Insufficient documentation

## 2016-07-20 LAB — VITAMIN D 25 HYDROXY (VIT D DEFICIENCY, FRACTURES): VITD: 40.49 ng/mL (ref 30.00–100.00)

## 2016-07-20 NOTE — Progress Notes (Signed)
Subjective:    Patient ID: Micheal [name], male    DOB: 15-Aug-1928, 83 y.o.   MRN: [id number]  HPI Pt is referred by Dr Mariea Clonts, for hypercalcemia.  Pt was noted to have moderate hypercalcemia in 2014 (it was normal in 2013). He has never had osteoporosis, urolithiasis, thyroid probs, sarcoidosis, or PUD.  He does not take A supplement, but he does take vit-D. Pt denies taking Li++, or HCTZ.  He has had 1 episode of pancreatitis, but does not recall the date.  Only bony fractures were forearms and right leg, both many years ago, with significant injuries. He has moderate arthralgias, worst at the lower back, and assoc depression.   Past Medical History:  Diagnosis Date  . Allergic rhinitis, cause unspecified   . Carpal tunnel syndrome   . Cervical spondylosis without myelopathy   . Cervical spondylosis without myelopathy   . Cervicalgia   . Diverticulosis of colon (without mention of hemorrhage)   . Esophageal reflux   . Hypertrophy of prostate with urinary obstruction and other lower urinary tract symptoms (LUTS)   . Insomnia, unspecified   . Internal hemorrhoids without mention of complication   . Irritable bowel syndrome   . Macular degeneration (senile) of retina, unspecified   . Malignant neoplasm of prostate ([place])   . Neurogenic bladder, NOS   . Nonspecific (abnormal) findings on radiological and other examination of gastrointestinal tract   . Orthostatic hypotension   . Osteoarthrosis, unspecified whether generalized or localized, unspecified site   . Other and unspecified hyperlipidemia   . Other malaise and fatigue   . Other specified disorder of rectum and anus    Radiation Proctitus  . Paroxysmal supraventricular tachycardia ([place])   . Restless legs syndrome (RLS)   . Retinal detachment with retinal defect, unspecified   . Right bundle branch block    Incomplete  . Spermatocele    Right  . Trigger finger (acquired)   . Type II or unspecified type diabetes mellitus  without mention of complication, not stated as uncontrolled   . Type II or unspecified type diabetes mellitus without mention of complication, uncontrolled   . Unspecified essential hypertension   . Vitamin D deficiency     Past Surgical History:  Procedure Laterality Date  . Angiolipoma  1980   Right arm  . APPENDECTOMY  1951  . CHOLECYSTECTOMY  01/1994  . COLONOSCOPY  04/11/2010  . COLONOSCOPY W/ POLYPECTOMY  2005   Removed 2 (two) polyps  . DUPUYTREN CONTRACTURE RELEASE Bilateral 1989  . EYE SURGERY  2006   Left  . EYE SURGERY  2006   Retina reattachment, right  . EYE SURGERY  02/1994   Cataract surgery, right  . EYE SURGERY  01/1995   Cataract surgery, left  . KNEE SURGERY  1979  . POLYPECTOMY  1955   Vocal cord  . RETINAL DETACHMENT SURGERY Right 2005  . SHOULDER ARTHROSCOPY Right 03/22/15   [place]  . TENDON RELEASE  01/1997   Fourth finger, left  . TRANSURETHRAL RESECTION OF PROSTATE  08/1989  . VITRECTOMY Left 04/23/2013   [facility]    Social History   Social History  . Marital status: Married    Spouse name: N/A  . Number of children: N/A  . Years of education: N/A   Occupational History  . Not on file.   Social History Main Topics  . Smoking status: Never Smoker  . Smokeless tobacco: Never Used  . Alcohol use No  .  Drug use: No  . Sexual activity: No   Other Topics Concern  . Not on file   Social History Narrative   Married   Never smoked   Alcohol none   Exercise walks dog twice a day   Living will       Current Outpatient Prescriptions on File Prior to Visit  Medication Sig Dispense Refill  . acetaminophen (TYLENOL) 500 MG tablet Take 500 mg by mouth every 6 (six) hours as needed for pain. Take one tablet as needed for pain.    . cholecalciferol (VITAMIN D) 1000 UNITS tablet Take 2,000 Units by mouth daily. Take 2 capsules daily for vitamin D supplement    . docusate sodium (EQUATE STOOL SOFTENER) 100 MG capsule Take 100 mg by mouth  daily.    . famotidine (PEPCID) 20 MG tablet Take 20 mg by mouth as needed. Take one tablet as needed for reflux.    . Insulin Isophane & Regular Human (HUMULIN 70/30 KWIKPEN) (70-30) 100 UNIT/ML PEN Inject 20 units each mornig 15 pen 5  . Insulin Pen Needle (B-D ULTRAFINE III SHORT PEN) 31G X 8 MM MISC Use two needles once daily for insulin injections. 180 each 3  . LANTUS SOLOSTAR 100 UNIT/ML Solostar Pen INJECT SUBCUTANEOUSLY 40  UNITS AT BEDTIME FOR  DIABETES 45 mL 6  . metoprolol (LOPRESSOR) 50 MG tablet Take 1 tablet (50 mg total) by mouth 2 (two) times daily. 180 tablet 3  . Multiple Vitamin (MULTIVITAMIN WITH MINERALS) TABS tablet Take 1 tablet by mouth daily.    . tamsulosin (FLOMAX) 0.4 MG CAPS capsule TAKE 1 CAPSULE BY MOUTH  DAILY FOR PROSTATE 90 capsule 2   No current facility-administered medications on file prior to visit.     Allergies  Allergen Reactions  . Aspirin Other (See Comments)    Bothers stomach, thins blood   . Atorvastatin Other (See Comments)    Muscular pain and weakness  . Prednisone Other (See Comments)    Upset stomach   . Simvastatin Other (See Comments)    Muscular pain and weakness  . Sulfa Antibiotics Other (See Comments)    Upset stomach   . Metformin Hcl Nausea Only  . Voltaren [Diclofenac Sodium] Other (See Comments)    Family History  Problem Relation Age of Onset  . Depression Mother   . Diabetes Mother   . Mental illness Mother        OCD  . Hypertension Sister   . Diabetes Daughter   . Hyperlipidemia Daughter   . Hyperparathyroidism Neg Hx     BP 130/80   Pulse 90   Ht 5\' 8"  (1.727 m)   Wt 158 lb (71.7 kg)   SpO2 90%   BMI 24.02 kg/m    Review of Systems denies weight loss, galactorrhea, hematuria, memory loss, heat intolerance, numbness, abdominal pain, muscle weakness, hypoglycemia, skin rash, sob, diarrhea, rhinorrhea, easy bruising.  He has frequent urination and chronic visual loss.    Objective:   Physical  Exam VS: see vs page GEN: no distress HEAD: head: no deformity eyes: no periorbital swelling, no proptosis external nose and ears are normal mouth: no lesion seen Ears: right hearing aid NECK: supple, thyroid is not enlarged CHEST WALL: no deformity.  Mild bilat pseudogynecomastia.  No kyphosis LUNGS: clear to auscultation CV: reg rate and rhythm, no murmur ABD: abdomen is soft, nontender.  no hepatosplenomegaly.  not distended.  no hernia MUSCULOSKELETAL: muscle bulk and strength are grossly normal.  no obvious joint swelling.  gait is normal and steady EXTEMITIES: No leg edema PULSES: no carotid bruit NEURO:  cn 2-12 grossly intact.   readily moves all 4's.  sensation is intact to touch on all 4's SKIN:  Normal texture and temperature.  No rash or suspicious lesion is visible.   NODES:  None palpable at the neck PSYCH: alert, well-oriented.  Does not appear anxious nor depressed.  Lab Results  Component Value Date   PTH 108 (H) 06/07/2016   CALCIUM 10.8 (H) 06/07/2016   CAION 6.1 (H) 06/07/2016   Lab Results  Component Value Date   ALT 11 06/05/2016   AST 16 06/05/2016   ALKPHOS 87 06/05/2016   BILITOT 0.5 06/05/2016    I personally reviewed electrocardiogram tracing (03/07/13): Indication: abd pain Impression: NSR.  No MI.  No hypertrophy.  NS-STWA Compared to 2000: ST is resolved     Assessment & Plan:  Hyperparathyroidism, prob primary.  He does not meet criteria for surgery, so we'll follow. Frail elderly state: this is a relative contraindication to surgery.   Patient Instructions  blood tests are requested for you today.  We'll let you know about the results.  Let's check a bone density test, at the [facility] office.  If there tests are OK, no treatment is needed for this, so please come back for a follow-up appointment in 6 months.

## 2016-07-20 NOTE — Telephone Encounter (Signed)
Called and scheduled a bone density scan at [place] ([address]) for this patient- the appointment is scheduled for 07/26/16 at 11 am and I notified the patient of this at the office today after his visit

## 2016-07-20 NOTE — Patient Instructions (Addendum)
blood tests are requested for you today.  We'll let you know about the results.  Let's check a bone density test, at the [facility] office.  If there tests are OK, no treatment is needed for this, so please come back for a follow-up appointment in 6 months.

## 2016-07-23 LAB — PTH, INTACT AND CALCIUM
Calcium: 11.7 mg/dL — ABNORMAL HIGH (ref 8.6–10.3)
PTH: 114 pg/mL — ABNORMAL HIGH (ref 14–64)

## 2016-07-24 DIAGNOSIS — M1711 Unilateral primary osteoarthritis, right knee: Secondary | ICD-10-CM | POA: Diagnosis not present

## 2016-07-24 DIAGNOSIS — M5136 Other intervertebral disc degeneration, lumbar region: Secondary | ICD-10-CM | POA: Diagnosis not present

## 2016-07-24 LAB — PROTEIN ELECTROPHORESIS, SERUM
Albumin ELP: 4.4 g/dL (ref 3.8–4.8)
Alpha-1-Globulin: 0.3 g/dL (ref 0.2–0.3)
Alpha-2-Globulin: 1 g/dL — ABNORMAL HIGH (ref 0.5–0.9)
Beta 2: 0.3 g/dL (ref 0.2–0.5)
Beta Globulin: 0.4 g/dL (ref 0.4–0.6)
Gamma Globulin: 0.4 g/dL — ABNORMAL LOW (ref 0.8–1.7)
Total Protein, Serum Electrophoresis: 6.7 g/dL (ref 6.1–8.1)

## 2016-07-26 ENCOUNTER — Ambulatory Visit (INDEPENDENT_AMBULATORY_CARE_PROVIDER_SITE_OTHER)
Admission: RE | Admit: 2016-07-26 | Discharge: 2016-07-26 | Disposition: A | Discharge status: Home / Self Care | Payer: Medicare Other | Source: Ambulatory Visit | Attending: Endocrinology | Admitting: Endocrinology

## 2016-07-26 DIAGNOSIS — E213 Hyperparathyroidism, unspecified: Secondary | ICD-10-CM | POA: Diagnosis not present

## 2016-07-27 ENCOUNTER — Telehealth: Payer: Self-pay

## 2016-07-27 NOTE — Telephone Encounter (Signed)
-----   Message from Renato Shin, MD sent at 07/26/2016  5:42 PM EDT ----- [place]. Please come back for a follow-up appointment in 2 weeks

## 2016-07-27 NOTE — Telephone Encounter (Signed)
I contacted the patient and advised of message via voicemail. Requested a call back from the the patient to schedule his 2 week follow up appointment.

## 2016-07-31 ENCOUNTER — Ambulatory Visit (INDEPENDENT_AMBULATORY_CARE_PROVIDER_SITE_OTHER): Payer: Medicare Other | Admitting: Endocrinology

## 2016-07-31 ENCOUNTER — Telehealth: Payer: Self-pay

## 2016-07-31 VITALS — BP 126/82 | HR 85 | Ht 68.0 in | Wt 160.0 lb

## 2016-07-31 DIAGNOSIS — E213 Hyperparathyroidism, unspecified: Secondary | ICD-10-CM | POA: Diagnosis not present

## 2016-07-31 NOTE — Patient Instructions (Addendum)
Your overactive parathyroid is causing or contributing to the osteoporosis.  The only effective treatment for this is surgery.   You have 2 options: one is to see Dr Mariea Clonts, to consider your risk of surgery.  The other is to have a once-a-year infusion, called "reclast," for the osteoporosis (this does not help the overacgive parathyroid).  Please return in 1 year.

## 2016-07-31 NOTE — Progress Notes (Signed)
Subjective:    Patient ID: Micheal [name], male    DOB: 12/16/1928, 83 y.o.   MRN: [id number]  HPI Pt returns for f/u of primary hyperparathyroidism (dx'ed 2014; he has never had urolithiasis; he has had 1 episode of pancreatitis, but does not recall the date; only bony fractures were forearms and right leg, both many years ago, with significant injuries; only criterion for surgery would be osteoporosis).  Main symptoms today are diffuse arthralgias and fatigue.  Past Medical History:  Diagnosis Date  . Allergic rhinitis, cause unspecified   . Carpal tunnel syndrome   . Cervical spondylosis without myelopathy   . Cervical spondylosis without myelopathy   . Cervicalgia   . Diverticulosis of colon (without mention of hemorrhage)   . Esophageal reflux   . Hypertrophy of prostate with urinary obstruction and other lower urinary tract symptoms (LUTS)   . Insomnia, unspecified   . Internal hemorrhoids without mention of complication   . Irritable bowel syndrome   . Macular degeneration (senile) of retina, unspecified   . Malignant neoplasm of prostate ([place])   . Neurogenic bladder, NOS   . Nonspecific (abnormal) findings on radiological and other examination of gastrointestinal tract   . Orthostatic hypotension   . Osteoarthrosis, unspecified whether generalized or localized, unspecified site   . Other and unspecified hyperlipidemia   . Other malaise and fatigue   . Other specified disorder of rectum and anus    Radiation Proctitus  . Paroxysmal supraventricular tachycardia ([place])   . Restless legs syndrome (RLS)   . Retinal detachment with retinal defect, unspecified   . Right bundle branch block    Incomplete  . Spermatocele    Right  . Trigger finger (acquired)   . Type II or unspecified type diabetes mellitus without mention of complication, not stated as uncontrolled   . Type II or unspecified type diabetes mellitus without mention of complication, uncontrolled   . Unspecified  essential hypertension   . Vitamin D deficiency     Past Surgical History:  Procedure Laterality Date  . Angiolipoma  1980   Right arm  . APPENDECTOMY  1951  . CHOLECYSTECTOMY  01/1994  . COLONOSCOPY  04/11/2010  . COLONOSCOPY W/ POLYPECTOMY  2005   Removed 2 (two) polyps  . DUPUYTREN CONTRACTURE RELEASE Bilateral 1989  . EYE SURGERY  2006   Left  . EYE SURGERY  2006   Retina reattachment, right  . EYE SURGERY  02/1994   Cataract surgery, right  . EYE SURGERY  01/1995   Cataract surgery, left  . KNEE SURGERY  1979  . POLYPECTOMY  1955   Vocal cord  . RETINAL DETACHMENT SURGERY Right 2005  . SHOULDER ARTHROSCOPY Right 03/22/15   [place]  . TENDON RELEASE  01/1997   Fourth finger, left  . TRANSURETHRAL RESECTION OF PROSTATE  08/1989  . VITRECTOMY Left 04/23/2013   [facility]    Social History   Social History  . Marital status: Married    Spouse name: N/A  . Number of children: N/A  . Years of education: N/A   Occupational History  . Not on file.   Social History Main Topics  . Smoking status: Never Smoker  . Smokeless tobacco: Never Used  . Alcohol use No  . Drug use: No  . Sexual activity: No   Other Topics Concern  . Not on file   Social History Narrative   Married   Never smoked   Alcohol none  Exercise walks dog twice a day   Living will       Current Outpatient Prescriptions on File Prior to Visit  Medication Sig Dispense Refill  . acetaminophen (TYLENOL) 500 MG tablet Take 500 mg by mouth every 6 (six) hours as needed for pain. Take one tablet as needed for pain.    . cholecalciferol (VITAMIN D) 1000 UNITS tablet Take 2,000 Units by mouth daily. Take 2 capsules daily for vitamin D supplement    . docusate sodium (EQUATE STOOL SOFTENER) 100 MG capsule Take 100 mg by mouth daily.    . famotidine (PEPCID) 20 MG tablet Take 20 mg by mouth as needed. Take one tablet as needed for reflux.    . Insulin Isophane & Regular Human (HUMULIN 70/30  KWIKPEN) (70-30) 100 UNIT/ML PEN Inject 20 units each mornig 15 pen 5  . Insulin Pen Needle (B-D ULTRAFINE III SHORT PEN) 31G X 8 MM MISC Use two needles once daily for insulin injections. 180 each 3  . LANTUS SOLOSTAR 100 UNIT/ML Solostar Pen INJECT SUBCUTANEOUSLY 40  UNITS AT BEDTIME FOR  DIABETES 45 mL 6  . metoprolol (LOPRESSOR) 50 MG tablet Take 1 tablet (50 mg total) by mouth 2 (two) times daily. 180 tablet 3  . Multiple Vitamin (MULTIVITAMIN WITH MINERALS) TABS tablet Take 1 tablet by mouth daily.    . Probiotic Product (PROBIOTIC-10) CHEW Chew 1 each by mouth daily.    . tamsulosin (FLOMAX) 0.4 MG CAPS capsule TAKE 1 CAPSULE BY MOUTH  DAILY FOR PROSTATE 90 capsule 2   No current facility-administered medications on file prior to visit.     Allergies  Allergen Reactions  . Aspirin Other (See Comments)    Bothers stomach, thins blood   . Atorvastatin Other (See Comments)    Muscular pain and weakness  . Prednisone Other (See Comments)    Upset stomach   . Simvastatin Other (See Comments)    Muscular pain and weakness  . Sulfa Antibiotics Other (See Comments)    Upset stomach   . Metformin Hcl Nausea Only  . Voltaren [Diclofenac Sodium] Other (See Comments)    Family History  Problem Relation Age of Onset  . Depression Mother   . Diabetes Mother   . Mental illness Mother        OCD  . Hypertension Sister   . Diabetes Daughter   . Hyperlipidemia Daughter   . Hyperparathyroidism Neg Hx     BP 126/82   Pulse 85   Ht 5\' 8"  (1.727 m)   Wt 160 lb (72.6 kg)   SpO2 96%   BMI 24.33 kg/m    Review of Systems Denies falls.      Objective:   Physical Exam VITAL SIGNS:  See vs page GENERAL: no distress Gait: normal and steady.     We reviewed DEXA today    Assessment & Plan:  Primary hyperparathyroidism.  We discussed surgery at advanced age vs rx'ing osteoporosis with reclast.  Patient Instructions  Your overactive parathyroid is causing or contributing to the  osteoporosis.  The only effective treatment for this is surgery.   You have 2 options: one is to see Dr Mariea Clonts, to consider your risk of surgery.  The other is to have a once-a-year infusion, called "reclast," for the osteoporosis (this does not help the overacgive parathyroid).  Please return in 1 year.

## 2016-07-31 NOTE — Telephone Encounter (Signed)
Patient walked in to the office today to see about getting scheduled for surgical clearance based on his recent appointment with Dr. Renato Shin at [facility].   Patient instructions on AVS from that visit state the following: "your overactive parathyroid is causing or contributing to the osteoporosis. The only effective treatment for this is surgery. You have 2 options: one is to see Dr. Mariea Clonts, to consider your risk of surgery. The other is to have a once a year infusion, called reclast for the osteoporosis (this does not help the overactive parathyroid. Please return in 1 year."  Patient is extremely nervous about both of these options and would like to discuss them at an appointment. Pt is scheduled for an appointment in August but he does not want to wait that long.  Please advise   FYI: Endocrinology note was copied and placed in Dr. Cyndi Lennert folder for review.

## 2016-08-01 ENCOUNTER — Encounter: Payer: Self-pay | Admitting: Nurse Practitioner

## 2016-08-01 ENCOUNTER — Ambulatory Visit (INDEPENDENT_AMBULATORY_CARE_PROVIDER_SITE_OTHER): Payer: Medicare Other | Admitting: Nurse Practitioner

## 2016-08-01 VITALS — BP 118/74 | HR 87 | Temp 97.9°F | Resp 18 | Ht 68.0 in | Wt 159.0 lb

## 2016-08-01 DIAGNOSIS — E213 Hyperparathyroidism, unspecified: Secondary | ICD-10-CM

## 2016-08-01 DIAGNOSIS — R5383 Other fatigue: Secondary | ICD-10-CM

## 2016-08-01 DIAGNOSIS — I471 Supraventricular tachycardia, unspecified: Secondary | ICD-10-CM

## 2016-08-01 LAB — CBC WITH DIFFERENTIAL/PLATELET
Basophils Absolute: 56 cells/uL (ref 0–200)
Basophils Relative: 1 %
Eosinophils Absolute: 336 cells/uL (ref 15–500)
Eosinophils Relative: 6 %
HCT: 39.2 % (ref 38.5–50.0)
Hemoglobin: 13.3 g/dL (ref 13.2–17.1)
Lymphocytes Relative: 22 %
Lymphs Abs: 1232 cells/uL (ref 850–3900)
MCH: 30 pg (ref 27.0–33.0)
MCHC: 33.9 g/dL (ref 32.0–36.0)
MCV: 88.3 fL (ref 80.0–100.0)
MPV: 12.1 fL (ref 7.5–12.5)
Monocytes Absolute: 616 cells/uL (ref 200–950)
Monocytes Relative: 11 %
Neutro Abs: 3360 cells/uL (ref 1500–7800)
Neutrophils Relative %: 60 %
Platelets: 229 10*3/uL (ref 140–400)
RBC: 4.44 MIL/uL (ref 4.20–5.80)
RDW: 13.1 % (ref 11.0–15.0)
WBC: 5.6 10*3/uL (ref 3.8–10.8)

## 2016-08-01 LAB — COMPLETE METABOLIC PANEL WITH GFR
ALT: 14 U/L (ref 9–46)
AST: 17 U/L (ref 10–35)
Albumin: 4.4 g/dL (ref 3.6–5.1)
Alkaline Phosphatase: 93 U/L (ref 40–115)
BUN: 13 mg/dL (ref 7–25)
CO2: 22 mmol/L (ref 20–31)
Calcium: 11.1 mg/dL — ABNORMAL HIGH (ref 8.6–10.3)
Chloride: 106 mmol/L (ref 98–110)
Creat: 0.94 mg/dL (ref 0.70–1.11)
GFR, Est African American: 85 mL/min (ref >=60)
GFR, Est Non African American: 73 mL/min (ref >=60)
Glucose, Bld: 136 mg/dL — ABNORMAL HIGH (ref 65–99)
Potassium: 4.8 mmol/L (ref 3.5–5.3)
Sodium: 138 mmol/L (ref 135–146)
Total Bilirubin: 0.5 mg/dL (ref 0.2–1.2)
Total Protein: 6.4 g/dL (ref 6.1–8.1)

## 2016-08-01 LAB — TSH: TSH: 1.3 mIU/L (ref 0.40–4.50)

## 2016-08-01 NOTE — Telephone Encounter (Signed)
Pt scheduled with Janett Billow today at 1 pm.   Message forwarded to [facility]

## 2016-08-01 NOTE — Telephone Encounter (Signed)
Only alternative is to have him come see Janett Billow instead.

## 2016-08-01 NOTE — Progress Notes (Signed)
Careteam: Patient Care Team: Gayland Curry, DO as PCP - General (Geriatric Medicine) Netta Cedars, MD as Consulting Physician (Orthopedic Surgery) Jasmine Awe, MD as Referring Physician (Ophthalmology) Rana Snare, MD as Consulting Physician (Urology)  Advanced Directive information Does Patient Have a Medical Advance Directive?: Yes, Type of Advance Directive: [place];Living will  Allergies  Allergen Reactions  . Aspirin Other (See Comments)    Bothers stomach, thins blood   . Atorvastatin Other (See Comments)    Muscular pain and weakness  . Prednisone Other (See Comments)    Upset stomach   . Simvastatin Other (See Comments)    Muscular pain and weakness  . Sulfa Antibiotics Other (See Comments)    Upset stomach   . Metformin Hcl Nausea Only  . Voltaren [Diclofenac Sodium] Other (See Comments)    Chief Complaint  Patient presents with  . Acute Visit    Pt is being seen for surgical clearance for removal of overactive parathyroid.      HPI: Patient is a 83 y.o. male seen in the office today due to need for parathyroid removal due to hyperparathyroid. Hx of tachycardia, right bundle branch block,  DM last A1c 7.8 on insulin, hyperlipidemia. Last stress test was over 8 years ago by Dr Gwenlyn Found.  Last year had surgery for his rotator cuff without adverse effect. Pt reports frequent hoariness and weakness which endocrine feels like surgery will help. Very active person.  Saw endocrinologist who recommended surgery.  Blood sugars range- 100s-179 fasting, but closer to 100-120, in the evening 160-180 but has been higher after cortisone shots to knee.  Review of Systems:  Review of Systems  Constitutional: Positive for malaise/fatigue. Negative for chills and fever.  HENT: Positive for hearing loss. Negative for congestion.   Eyes: Positive for blurred vision.  Respiratory: Negative for cough and shortness of breath.   Cardiovascular:  Negative for chest pain and palpitations.  Gastrointestinal: Negative for abdominal pain, blood in stool, constipation and melena.  Genitourinary: Negative for dysuria.       BPH  Musculoskeletal: Positive for joint pain. Negative for falls.  Skin: Negative for itching and rash.  Neurological: Positive for weakness. Negative for dizziness and loss of consciousness.  Psychiatric/Behavioral: Negative for memory loss. The patient is nervous/anxious.     Past Medical History:  Diagnosis Date  . Allergic rhinitis, cause unspecified   . Carpal tunnel syndrome   . Cervical spondylosis without myelopathy   . Cervical spondylosis without myelopathy   . Cervicalgia   . Diverticulosis of colon (without mention of hemorrhage)   . Esophageal reflux   . Hypertrophy of prostate with urinary obstruction and other lower urinary tract symptoms (LUTS)   . Insomnia, unspecified   . Internal hemorrhoids without mention of complication   . Irritable bowel syndrome   . Macular degeneration (senile) of retina, unspecified   . Malignant neoplasm of prostate ([place])   . Neurogenic bladder, NOS   . Nonspecific (abnormal) findings on radiological and other examination of gastrointestinal tract   . Orthostatic hypotension   . Osteoarthrosis, unspecified whether generalized or localized, unspecified site   . Other and unspecified hyperlipidemia   . Other malaise and fatigue   . Other specified disorder of rectum and anus    Radiation Proctitus  . Paroxysmal supraventricular tachycardia ([place])   . Restless legs syndrome (RLS)   . Retinal detachment with retinal defect, unspecified   . Right bundle branch block  Incomplete  . Spermatocele    Right  . Trigger finger (acquired)   . Type II or unspecified type diabetes mellitus without mention of complication, not stated as uncontrolled   . Type II or unspecified type diabetes mellitus without mention of complication, uncontrolled   . Unspecified essential  hypertension   . Vitamin D deficiency    Past Surgical History:  Procedure Laterality Date  . Angiolipoma  1980   Right arm  . APPENDECTOMY  1951  . CHOLECYSTECTOMY  01/1994  . COLONOSCOPY  04/11/2010  . COLONOSCOPY W/ POLYPECTOMY  2005   Removed 2 (two) polyps  . DUPUYTREN CONTRACTURE RELEASE Bilateral 1989  . EYE SURGERY  2006   Left  . EYE SURGERY  2006   Retina reattachment, right  . EYE SURGERY  02/1994   Cataract surgery, right  . EYE SURGERY  01/1995   Cataract surgery, left  . KNEE SURGERY  1979  . POLYPECTOMY  1955   Vocal cord  . RETINAL DETACHMENT SURGERY Right 2005  . SHOULDER ARTHROSCOPY Right 03/22/15   [place]  . TENDON RELEASE  01/1997   Fourth finger, left  . TRANSURETHRAL RESECTION OF PROSTATE  08/1989  . VITRECTOMY Left 04/23/2013   [facility]   Social History:   reports that he has never smoked. He has never used smokeless tobacco. He reports that he does not drink alcohol or use drugs.  Family History  Problem Relation Age of Onset  . Depression Mother   . Diabetes Mother   . Mental illness Mother        OCD  . Hypertension Sister   . Diabetes Daughter   . Hyperlipidemia Daughter   . Hyperparathyroidism Neg Hx     Medications: Patient's Medications  New Prescriptions   No medications on file  Previous Medications   ACETAMINOPHEN (TYLENOL) 500 MG TABLET    Take 500 mg by mouth every 6 (six) hours as needed for pain. Take one tablet as needed for pain.   CHOLECALCIFEROL (VITAMIN D) 1000 UNITS TABLET    Take 2,000 Units by mouth daily. Take 2 capsules daily for vitamin D supplement   DOCUSATE SODIUM (EQUATE STOOL SOFTENER) 100 MG CAPSULE    Take 100 mg by mouth daily.   FAMOTIDINE (PEPCID) 20 MG TABLET    Take 20 mg by mouth as needed. Take one tablet as needed for reflux.   INSULIN ISOPHANE & REGULAR HUMAN (HUMULIN 70/30 KWIKPEN) (70-30) 100 UNIT/ML PEN    Inject 20 units each mornig   INSULIN PEN NEEDLE (B-D ULTRAFINE III SHORT PEN) 31G X 8  MM MISC    Use two needles once daily for insulin injections.   LANTUS SOLOSTAR 100 UNIT/ML SOLOSTAR PEN    INJECT SUBCUTANEOUSLY 40  UNITS AT BEDTIME FOR  DIABETES   METOPROLOL (LOPRESSOR) 50 MG TABLET    Take 1 tablet (50 mg total) by mouth 2 (two) times daily.   MULTIPLE VITAMIN (MULTIVITAMIN WITH MINERALS) TABS TABLET    Take 1 tablet by mouth daily.   PROBIOTIC PRODUCT (PROBIOTIC-10) CHEW    Chew 1 each by mouth daily.   TAMSULOSIN (FLOMAX) 0.4 MG CAPS CAPSULE    TAKE 1 CAPSULE BY MOUTH  DAILY FOR PROSTATE  Modified Medications   No medications on file  Discontinued Medications   No medications on file     Physical Exam:  Vitals:   08/01/16 1305  BP: 118/74  Pulse: 87  Resp: 18  Temp: 97.9 F (  36.6 C)  TempSrc: Oral  SpO2: 96%  Weight: 159 lb (72.1 kg)  Height: 5' 8"  (1.727 m)   Body mass index is 24.18 kg/m.  Physical Exam  Constitutional: He is oriented to person, place, and time. He appears well-developed and well-nourished. No distress.  Eyes:  Poor vision  Cardiovascular: Normal rate, regular rhythm and normal heart sounds.   Pulmonary/Chest: Effort normal and breath sounds normal.  Abdominal: Soft. Bowel sounds are normal.  Musculoskeletal:  Weak; Can barely get up out of chair even with use of arms, tenderness of bilateral knees, right greater than left and right shoulder with limited ROM to right  Neurological: He is alert and oriented to person, place, and time.  Skin: Skin is warm and dry.  Psychiatric: He has a normal mood and affect.  Very talkative and friendly gentleman    Labs reviewed: Basic Metabolic Panel:  Recent Labs  11/23/15 1650 01/06/16 0832 06/05/16 0839 06/07/16 1416 07/20/16 1434  NA 140 142 143  --   --   K 4.8 4.4 4.1  --   --   CL 107 110 109  --   --   CO2 24 25 18*  --   --   GLUCOSE 145* 66 53*  --   --   BUN 12 13 17   --   --   CREATININE 1.18* 1.02 1.01  --   --   CALCIUM 10.7* 10.7* 10.8* 10.8* 11.7*  TSH 1.64  --    --   --   --    Liver Function Tests:  Recent Labs  11/23/15 1650 01/06/16 0832 06/05/16 0839  AST 17 16 16   ALT 13 15 11   ALKPHOS 102 97 87  BILITOT 0.4 0.5 0.5  PROT 6.2 6.3 6.3  ALBUMIN 4.3 4.3 4.2   No results for input(s): LIPASE, AMYLASE in the last 8760 hours. No results for input(s): AMMONIA in the last 8760 hours. CBC:  Recent Labs  11/23/15 1650  WBC 11.3*  NEUTROABS 8,588*  HGB 13.7  HCT 40.3  MCV 88.8  PLT 222   Lipid Panel:  Recent Labs  01/06/16 0832  CHOL 228*  HDL 44  LDLCALC 155*  TRIG 147  CHOLHDL 5.2*   TSH:  Recent Labs  11/23/15 1650  TSH 1.64   A1C: Lab Results  Component Value Date   HGBA1C 7.8 (H) 06/05/2016     Assessment/Plan 1. Hyperparathyroidism ([place]) -would like to seek surgical intervention, ENT referral placed - CMP with eGFR - CBC with Differential/Platelets - Ambulatory referral to ENT  2. Fatigue, unspecified type -most likely due to hyperparathyroid - TSH  3. Paroxysmal supraventricular tachycardia ([place]) -treated by Dr Nyoka Cowden, saw cardiologist in the past for treadmill stress test which was negative (per pt); do not see records for this in epic  - ECHOCARDIOGRAM COMPLETE; Future -EKG with right BBB rate 82, unchanged from previous in 2015  [place]. Harle Battiest  [facility] [phone] 8 am - 5 pm) [phone] (after hours)

## 2016-08-09 ENCOUNTER — Other Ambulatory Visit (HOSPITAL_COMMUNITY): Payer: Medicare Other

## 2016-08-09 ENCOUNTER — Ambulatory Visit (HOSPITAL_COMMUNITY): Payer: Medicare Other | Attending: Cardiology

## 2016-08-09 ENCOUNTER — Other Ambulatory Visit: Payer: Self-pay

## 2016-08-09 DIAGNOSIS — I471 Supraventricular tachycardia: Secondary | ICD-10-CM | POA: Insufficient documentation

## 2016-08-09 DIAGNOSIS — I351 Nonrheumatic aortic (valve) insufficiency: Secondary | ICD-10-CM | POA: Diagnosis not present

## 2016-08-17 ENCOUNTER — Emergency Department (HOSPITAL_COMMUNITY)
Admission: EM | Admit: 2016-08-17 | Discharge: 2016-08-17 | Disposition: A | Discharge status: Home / Self Care | Payer: Medicare Other | Attending: Emergency Medicine | Admitting: Emergency Medicine

## 2016-08-17 ENCOUNTER — Encounter (HOSPITAL_COMMUNITY): Payer: Self-pay | Admitting: Emergency Medicine

## 2016-08-17 ENCOUNTER — Emergency Department (HOSPITAL_COMMUNITY): Payer: Medicare Other

## 2016-08-17 DIAGNOSIS — I10 Essential (primary) hypertension: Secondary | ICD-10-CM | POA: Diagnosis not present

## 2016-08-17 DIAGNOSIS — Z79899 Other long term (current) drug therapy: Secondary | ICD-10-CM | POA: Diagnosis not present

## 2016-08-17 DIAGNOSIS — E1159 Type 2 diabetes mellitus with other circulatory complications: Secondary | ICD-10-CM | POA: Insufficient documentation

## 2016-08-17 DIAGNOSIS — Z794 Long term (current) use of insulin: Secondary | ICD-10-CM | POA: Diagnosis not present

## 2016-08-17 DIAGNOSIS — M19071 Primary osteoarthritis, right ankle and foot: Secondary | ICD-10-CM | POA: Diagnosis not present

## 2016-08-17 DIAGNOSIS — M1711 Unilateral primary osteoarthritis, right knee: Secondary | ICD-10-CM | POA: Diagnosis not present

## 2016-08-17 DIAGNOSIS — R404 Transient alteration of awareness: Secondary | ICD-10-CM | POA: Diagnosis not present

## 2016-08-17 DIAGNOSIS — R531 Weakness: Secondary | ICD-10-CM | POA: Diagnosis not present

## 2016-08-17 DIAGNOSIS — M79604 Pain in right leg: Secondary | ICD-10-CM | POA: Insufficient documentation

## 2016-08-17 LAB — BASIC METABOLIC PANEL
Anion gap: 8 (ref 5–15)
BUN: 15 mg/dL (ref 6–20)
CO2: 23 mmol/L (ref 22–32)
Calcium: 10.9 mg/dL — ABNORMAL HIGH (ref 8.9–10.3)
Chloride: 108 mmol/L (ref 101–111)
Creatinine, Ser: 0.89 mg/dL (ref 0.61–1.24)
GFR calc Af Amer: >60 mL/min (ref >60)
GFR calc non Af Amer: >60 mL/min (ref >60)
Glucose, Bld: 145 mg/dL — ABNORMAL HIGH (ref 65–99)
Potassium: 4.1 mmol/L (ref 3.5–5.1)
Sodium: 139 mmol/L (ref 135–145)

## 2016-08-17 MED ORDER — ACETAMINOPHEN 325 MG PO TABS
650.0000 mg | ORAL_TABLET | Freq: Once | ORAL | Status: AC
  Administered 2016-08-17: 650 mg via ORAL
  Filled 2016-08-17: qty 2

## 2016-08-17 MED ORDER — DICLOFENAC SODIUM 1 % TD GEL: 2.0000 g | Freq: Four times a day (QID) | TRANSDERMAL | 0 refills | Status: DC

## 2016-08-17 NOTE — ED Provider Notes (Signed)
[facility] DEPT Provider Note   CSN: [id number] Arrival date & time: 08/17/16  1734     History   Chief Complaint Chief Complaint  Patient presents with  . Knee Pain    HPI Micheal [name] is a 83 y.o. male.  The history is provided by the patient and the spouse. No language interpreter was used.  Knee Pain     Micheal [name] is a 83 y.o. male who presents to the Emergency Department complaining of leg pain.  He has a history of hyper parathyroid as well as arthritis in his knees. He was feeling well this morning when he walked down his driveway and backed into the house. He went to sit down and when he got back up he noted severe pain from his right knee to his foot. Pain is located along the back of his leg and worse with movement. He describes it as a throbbing toothache type sensation. He denies any muscle spasms, injuries, back pain, fevers, vomiting, diarrhea, abdominal pain, numbness, weakness. He did take a Tylenol with partial improvement in symptoms. Currently in the emergency department his leg does not hurt when he is resting on the stretcher but when he goes to move it he endorses recurrent pain. Past Medical History:  Diagnosis Date  . Allergic rhinitis, cause unspecified   . Carpal tunnel syndrome   . Cervical spondylosis without myelopathy   . Cervical spondylosis without myelopathy   . Cervicalgia   . Diverticulosis of colon (without mention of hemorrhage)   . Esophageal reflux   . Hypertrophy of prostate with urinary obstruction and other lower urinary tract symptoms (LUTS)   . Insomnia, unspecified   . Internal hemorrhoids without mention of complication   . Irritable bowel syndrome   . Macular degeneration (senile) of retina, unspecified   . Malignant neoplasm of prostate ([place])   . Neurogenic bladder, NOS   . Nonspecific (abnormal) findings on radiological and other examination of gastrointestinal tract   . Orthostatic hypotension   .  Osteoarthrosis, unspecified whether generalized or localized, unspecified site   . Other and unspecified hyperlipidemia   . Other malaise and fatigue   . Other specified disorder of rectum and anus    Radiation Proctitus  . Paroxysmal supraventricular tachycardia ([place])   . Restless legs syndrome (RLS)   . Retinal detachment with retinal defect, unspecified   . Right bundle branch block    Incomplete  . Spermatocele    Right  . Trigger finger (acquired)   . Type II or unspecified type diabetes mellitus without mention of complication, not stated as uncontrolled   . Type II or unspecified type diabetes mellitus without mention of complication, uncontrolled   . Unspecified essential hypertension   . Vitamin D deficiency     Patient Active Problem List   Diagnosis Date Noted  . Hyperparathyroidism ([place]) 07/20/2016  . Weak 11/23/2015  . Rotator cuff syndrome 05/25/2015  . Restless leg syndrome 05/25/2015  . Lumbago 09/14/2014  . Right shoulder pain 09/14/2014  . Knee pain, right 05/18/2014  . DM (diabetes mellitus), type 2 with peripheral vascular complications ([place]) 60/10/9321  . Diabetic neuropathy ([place]) 02/24/2014  . Legally blind 10/28/2013  . AMD (age-related macular degeneration), wet ([place]) 07/22/2013  . Dyspnea 07/22/2013  . Subretinal hemorrhage 04/24/2013  . Benign fibroma of prostate 04/22/2013  . Retinal hemorrhage of left eye 08/13/2012  . Malignant neoplasm of prostate ([place]) 03/24/2012  . Unspecified vitamin D deficiency 03/24/2012  .  Hyperlipidemia 03/24/2012  . Carpal tunnel syndrome 03/24/2012  . Retinal detachment with retinal defect, unspecified 03/24/2012  . Essential hypertension 03/24/2012  . Right bundle branch block 03/24/2012  . Paroxysmal supraventricular tachycardia ([place]) 03/24/2012  . Internal hemorrhoids without mention of complication 16/10/9602  . Orthostatic hypotension 03/24/2012  . Allergic rhinitis, cause unspecified 03/24/2012  . Esophageal  reflux 03/24/2012  . Diverticulosis of colon (without mention of hemorrhage) 03/24/2012  . Irritable bowel syndrome 03/24/2012  . Other specified disorder of rectum and anus 03/24/2012  . Neurogenic bladder, NOS 03/24/2012  . BPH (benign prostatic hyperplasia) 03/24/2012  . Spermatocele 03/24/2012  . Detached retina 09/21/2011    Past Surgical History:  Procedure Laterality Date  . Angiolipoma  1980   Right arm  . APPENDECTOMY  1951  . CHOLECYSTECTOMY  01/1994  . COLONOSCOPY  04/11/2010  . COLONOSCOPY W/ POLYPECTOMY  2005   Removed 2 (two) polyps  . DUPUYTREN CONTRACTURE RELEASE Bilateral 1989  . EYE SURGERY  2006   Left  . EYE SURGERY  2006   Retina reattachment, right  . EYE SURGERY  02/1994   Cataract surgery, right  . EYE SURGERY  01/1995   Cataract surgery, left  . KNEE SURGERY  1979  . POLYPECTOMY  1955   Vocal cord  . RETINAL DETACHMENT SURGERY Right 2005  . SHOULDER ARTHROSCOPY Right 03/22/15   [place]  . TENDON RELEASE  01/1997   Fourth finger, left  . TRANSURETHRAL RESECTION OF PROSTATE  08/1989  . VITRECTOMY Left 04/23/2013   [place] Medications    Prior to Admission medications   Medication Sig Start Date End Date Taking? Authorizing Provider  acetaminophen (TYLENOL) 500 MG tablet Take 1,000 mg by mouth every 6 (six) hours as needed for moderate pain. Take one tablet as needed for pain.    Yes [provider]  cholecalciferol (VITAMIN D) 1000 UNITS tablet Take 2,000 Units by mouth daily. Take 2 capsules daily for vitamin D supplement   Yes [provider]  docusate sodium (EQUATE STOOL SOFTENER) 100 MG capsule Take 100 mg by mouth at bedtime.    Yes [provider]  famotidine (PEPCID) 20 MG tablet Take 20 mg by mouth daily as needed for heartburn or indigestion. Take one tablet as needed for reflux.    Yes [provider]  Insulin Isophane & Regular Human (HUMULIN 70/30 KWIKPEN) (70-30) 100 UNIT/ML PEN  Inject 20 units each mornig 06/15/15  Yes Estill Dooms, MD  LANTUS SOLOSTAR 100 UNIT/ML Solostar Pen INJECT SUBCUTANEOUSLY 40  UNITS AT BEDTIME FOR  DIABETES 06/22/16  Yes Estill Dooms, MD  metoprolol (LOPRESSOR) 50 MG tablet Take 1 tablet (50 mg total) by mouth 2 (two) times daily. 02/17/16  Yes Estill Dooms, MD  Multiple Vitamin (MULTIVITAMIN WITH MINERALS) TABS tablet Take 1 tablet by mouth daily.   Yes [provider]  Probiotic Product (PROBIOTIC-10) CHEW Chew 1 each by mouth daily.   Yes [provider]  tamsulosin (FLOMAX) 0.4 MG CAPS capsule TAKE 1 CAPSULE BY MOUTH  DAILY FOR PROSTATE 12/05/15  Yes Estill Dooms, MD  diclofenac sodium (VOLTAREN) 1 % GEL Apply 2 g topically 4 (four) times daily. 08/17/16   Quintella Reichert, MD  Insulin Pen Needle (B-D ULTRAFINE III SHORT PEN) 31G X 8 MM MISC Use two needles once daily for insulin injections. 02/01/16   Estill Dooms, MD    Family History Family History  Problem Relation Age of Onset  . Depression Mother   . Diabetes Mother   . Mental illness Mother        OCD  . Hypertension Sister   . Diabetes Daughter   . Hyperlipidemia Daughter   . Hyperparathyroidism Neg Hx     Social History Social History  Substance Use Topics  . Smoking status: Never Smoker  . Smokeless tobacco: Never Used  . Alcohol use No     Allergies   Aspirin; Atorvastatin; Prednisone; Simvastatin; Sulfa antibiotics; Metformin hcl; and Voltaren [diclofenac sodium]   Review of Systems Review of Systems  All other systems reviewed and are negative.    Physical Exam Updated Vital Signs BP (!) 143/78 (BP Location: Left Arm)   Pulse 79   Temp 97.9 F (36.6 C) (Oral)   Resp 15   SpO2 96%   Physical Exam  Constitutional: He is oriented to person, place, and time. He appears well-developed and well-nourished.  HENT:  Head: Normocephalic and atraumatic.  Cardiovascular: Normal rate and regular rhythm.   No murmur  heard. Pulmonary/Chest: Effort normal and breath sounds normal. No respiratory distress.  Abdominal: Soft. There is no tenderness. There is no rebound and no guarding.  Musculoskeletal: He exhibits no edema or tenderness.  1+ DP pulses bilaterally, 2+ PT pulses bilaterally, 2+ femoral pulses bilaterally. Bilateral lower extremity with no erythema, edema or lesions. There is no tenderness to palpation throughout the knee, calf, ankle, foot. There is pain to the posterior calf with dorsiflexion and plantarflexion of the foot with no palpable muscle spasm. Flexion and extension is intact in the knee but there is reproducible pain to the anterior and posterior leg with range of motion of the knee. Sensation to light touch intact throughout bilateral lower extremities with 5 out of 5 strength in bilateral lower extremities.  Neurological: He is alert and oriented to person, place, and time.  Skin: Skin is warm and dry.  Psychiatric: He has a normal mood and affect. His behavior is normal.  Nursing note and vitals reviewed.    ED Treatments / Results  Labs (all labs ordered are listed, but only abnormal results are displayed) Labs Reviewed  BASIC METABOLIC PANEL - Abnormal; Notable for the following:       Result Value   Glucose, Bld 145 (*)    Calcium 10.9 (*)    All other components within normal limits    EKG  EKG Interpretation None       Radiology Dg Tibia/fibula Right  Result Date: 08/17/2016 CLINICAL DATA:  Right knee instability. Right knee surgery approximately 40 years ago. EXAM: RIGHT TIBIA AND FIBULA - 2 VIEW COMPARISON:  Right knee radiographs obtained at the same time. FINDINGS: Mild right knee degenerative changes. Otherwise, unremarkable tibia and fibula. Arterial calcifications. IMPRESSION: No acute abnormality. Electronically Signed   By: Claudie Revering M.D.   On: 08/17/2016 19:11   Dg Knee Complete 4 Views Right  Result Date: 08/17/2016 CLINICAL DATA:  Right knee  instability. Right knee surgery approximately 40 years ago. EXAM: RIGHT KNEE - COMPLETE 4+ VIEW COMPARISON:  Right lower leg radiographs obtained at the same time. FINDINGS: Moderate to marked medial joint space narrowing and mild associated spur formation. Mild to moderate posterior patellar spur formation. Small effusion. IMPRESSION: Medial compartment and patellofemoral degenerative changes and small effusion. Electronically Signed   By: Claudie Revering M.D.   On: 08/17/2016 19:13    Procedures Procedures (including critical care time)  Medications  Ordered in ED Medications  acetaminophen (TYLENOL) tablet 650 mg (650 mg Oral Given 08/17/16 1907)     Initial Impression / Assessment and Plan / ED Course  I have reviewed the triage vital signs and the nursing notes.  Pertinent labs & imaging results that were available during my care of the patient were reviewed by me and considered in my medical decision making (see chart for details).     Patient here for evaluation of right leg pain that is atraumatic. Pain is worse with movement of the foot and knee swells ambulation. He is neurovascularly intact on examination with no evidence of acute injury. Clinical presentation is not consistent with acute fracture, acute infectious process, PVD or DVT. Consultation on home care for muscular pain with outpatient follow up and return precautions.  Final Clinical Impressions(s) / ED Diagnoses   Final diagnoses:  Right leg pain    New Prescriptions Discharge Medication List as of 08/17/2016  8:48 PM    START taking these medications   Details  diclofenac sodium (VOLTAREN) 1 % GEL Apply 2 g topically 4 (four) times daily., Starting Fri 08/17/2016, Print         Quintella Reichert, MD 08/18/16 [phone]

## 2016-08-17 NOTE — ED Triage Notes (Signed)
Pt reports his R knee has been giving out with weight bearing today. No known injury.

## 2016-08-17 NOTE — ED Notes (Signed)
Patient transported to X-ray 

## 2016-08-27 DIAGNOSIS — H353233 Exudative age-related macular degeneration, bilateral, with inactive scar: Secondary | ICD-10-CM | POA: Diagnosis not present

## 2016-09-04 ENCOUNTER — Other Ambulatory Visit: Payer: Medicare Other

## 2016-09-04 DIAGNOSIS — E1151 Type 2 diabetes mellitus with diabetic peripheral angiopathy without gangrene: Secondary | ICD-10-CM

## 2016-09-05 DIAGNOSIS — E213 Hyperparathyroidism, unspecified: Secondary | ICD-10-CM | POA: Diagnosis not present

## 2016-09-05 LAB — COMPLETE METABOLIC PANEL WITH GFR
ALT: 16 U/L (ref 9–46)
AST: 17 U/L (ref 10–35)
Albumin: 4.2 g/dL (ref 3.6–5.1)
Alkaline Phosphatase: 78 U/L (ref 40–115)
BUN: 10 mg/dL (ref 7–25)
CO2: 22 mmol/L (ref 20–32)
Calcium: 10.7 mg/dL — ABNORMAL HIGH (ref 8.6–10.3)
Chloride: 109 mmol/L (ref 98–110)
Creat: 0.92 mg/dL (ref 0.70–1.11)
GFR, Est African American: 87 mL/min (ref >=60)
GFR, Est Non African American: 75 mL/min (ref >=60)
Glucose, Bld: 143 mg/dL — ABNORMAL HIGH (ref 65–99)
Potassium: 4.8 mmol/L (ref 3.5–5.3)
Sodium: 142 mmol/L (ref 135–146)
Total Bilirubin: 0.4 mg/dL (ref 0.2–1.2)
Total Protein: 5.9 g/dL — ABNORMAL LOW (ref 6.1–8.1)

## 2016-09-05 LAB — HEMOGLOBIN A1C
Hgb A1c MFr Bld: 7.8 % — ABNORMAL HIGH (ref <5.7)
Mean Plasma Glucose: 177 mg/dL

## 2016-09-05 LAB — LIPID PANEL
Cholesterol: 254 mg/dL — ABNORMAL HIGH (ref <200)
HDL: 35 mg/dL — ABNORMAL LOW (ref >40)
LDL Cholesterol: 162 mg/dL — ABNORMAL HIGH (ref <100)
Total CHOL/HDL Ratio: 7.3 Ratio — ABNORMAL HIGH (ref <5.0)
Triglycerides: 285 mg/dL — ABNORMAL HIGH (ref <150)
VLDL: 57 mg/dL — ABNORMAL HIGH (ref <30)

## 2016-09-06 ENCOUNTER — Encounter: Payer: Self-pay | Admitting: Internal Medicine

## 2016-09-06 ENCOUNTER — Ambulatory Visit (INDEPENDENT_AMBULATORY_CARE_PROVIDER_SITE_OTHER): Payer: Medicare Other | Admitting: Internal Medicine

## 2016-09-06 VITALS — BP 130/80 | HR 81 | Temp 97.8°F | Wt 166.0 lb

## 2016-09-06 DIAGNOSIS — C61 Malignant neoplasm of prostate: Secondary | ICD-10-CM

## 2016-09-06 DIAGNOSIS — M545 Low back pain, unspecified: Secondary | ICD-10-CM

## 2016-09-06 DIAGNOSIS — E1151 Type 2 diabetes mellitus with diabetic peripheral angiopathy without gangrene: Secondary | ICD-10-CM

## 2016-09-06 DIAGNOSIS — G8929 Other chronic pain: Secondary | ICD-10-CM

## 2016-09-06 DIAGNOSIS — E210 Primary hyperparathyroidism: Secondary | ICD-10-CM

## 2016-09-06 DIAGNOSIS — M159 Polyosteoarthritis, unspecified: Secondary | ICD-10-CM | POA: Insufficient documentation

## 2016-09-06 DIAGNOSIS — M810 Age-related osteoporosis without current pathological fracture: Secondary | ICD-10-CM | POA: Diagnosis not present

## 2016-09-06 NOTE — Progress Notes (Signed)
Location:  [facility] clinic Provider:  Jondavid Schreier L. Mariea Clonts, D.O., C.M.D.  Code Status: DNR Goals of Care:  Advanced Directives 09/06/2016  Does Patient Have a Medical Advance Directive? Yes  Type of Paramedic of [place];Living will  Does patient want to make changes to medical advance directive? -  Copy of [place] in Chart? No - copy requested  Would patient like information on creating a medical advance directive? -     Chief Complaint  Patient presents with  . Medical Management of Chronic Issues    23mth follow-up    HPI: Patient is a 83 y.o. male seen today for medical management of chronic diseases.    Hyperparathyroidism:  Went to ENT and was told they don't do parathyroid surgery.  He has severe blah feeling like the flu is coming on.  Extreme fatigue.  Diffuse aches have progressed.    Right leg hurts from the quadricep down to the foot.  Also has arthritis in his right knee.  Pain goes from his back into his quad and into his calf.  He did go to therapy and he was having too much pain to participate.  He did get a back injection and then a left knee injection.  They help temporarily with his arthritis.  Urination has been a problem since his prostate surgery.  He goes a lot--twice since he's been here.  Gets up every 2 hours.  Can't start or stop easily. He has to hurry up and go or he has to change his clothes.    DMII:  Well controlled with hba1c 7.8.  Has neuropathy and PVD  Has had pancreatitis and diverticulitis in the past.  Past Medical History:  Diagnosis Date  . Allergic rhinitis, cause unspecified   . Carpal tunnel syndrome   . Cervical spondylosis without myelopathy   . Cervical spondylosis without myelopathy   . Cervicalgia   . Diverticulosis of colon (without mention of hemorrhage)   . Esophageal reflux   . Hypertrophy of prostate with urinary obstruction and other lower urinary tract symptoms (LUTS)   . Insomnia,  unspecified   . Internal hemorrhoids without mention of complication   . Irritable bowel syndrome   . Macular degeneration (senile) of retina, unspecified   . Malignant neoplasm of prostate ([place])   . Neurogenic bladder, NOS   . Nonspecific (abnormal) findings on radiological and other examination of gastrointestinal tract   . Orthostatic hypotension   . Osteoarthrosis, unspecified whether generalized or localized, unspecified site   . Other and unspecified hyperlipidemia   . Other malaise and fatigue   . Other specified disorder of rectum and anus    Radiation Proctitus  . Paroxysmal supraventricular tachycardia ([place])   . Restless legs syndrome (RLS)   . Retinal detachment with retinal defect, unspecified   . Right bundle branch block    Incomplete  . Spermatocele    Right  . Trigger finger (acquired)   . Type II or unspecified type diabetes mellitus without mention of complication, not stated as uncontrolled   . Type II or unspecified type diabetes mellitus without mention of complication, uncontrolled   . Unspecified essential hypertension   . Vitamin D deficiency     Past Surgical History:  Procedure Laterality Date  . Angiolipoma  1980   Right arm  . APPENDECTOMY  1951  . CHOLECYSTECTOMY  01/1994  . COLONOSCOPY  04/11/2010  . COLONOSCOPY W/ POLYPECTOMY  2005   Removed 2 (  two) polyps  . DUPUYTREN CONTRACTURE RELEASE Bilateral 1989  . EYE SURGERY  2006   Left  . EYE SURGERY  2006   Retina reattachment, right  . EYE SURGERY  02/1994   Cataract surgery, right  . EYE SURGERY  01/1995   Cataract surgery, left  . KNEE SURGERY  1979  . POLYPECTOMY  1955   Vocal cord  . RETINAL DETACHMENT SURGERY Right 2005  . SHOULDER ARTHROSCOPY Right 03/22/15   [place]  . TENDON RELEASE  01/1997   Fourth finger, left  . TRANSURETHRAL RESECTION OF PROSTATE  08/1989  . VITRECTOMY Left 04/23/2013   [facility]    Allergies  Allergen Reactions  . Aspirin Other (See Comments)     Bothers stomach, thins blood   . Atorvastatin Other (See Comments)    Muscular pain and weakness  . Prednisone Other (See Comments)    Upset stomach   . Simvastatin Other (See Comments)    Muscular pain and weakness  . Sulfa Antibiotics Other (See Comments)    Upset stomach   . Metformin Hcl Nausea Only  . Voltaren [Diclofenac Sodium] Other (See Comments)    Allergies as of 09/06/2016      Reactions   Aspirin Other (See Comments)   Bothers stomach, thins blood    Atorvastatin Other (See Comments)   Muscular pain and weakness   Prednisone Other (See Comments)   Upset stomach    Simvastatin Other (See Comments)   Muscular pain and weakness   Sulfa Antibiotics Other (See Comments)   Upset stomach    Metformin Hcl Nausea Only   Voltaren [diclofenac Sodium] Other (See Comments)      Medication List       Accurate as of 09/06/16 11:30 AM. Always use your most recent med list.          acetaminophen 500 MG tablet Commonly known as:  TYLENOL Take 1,000 mg by mouth every 6 (six) hours as needed for moderate pain. Take one tablet as needed for pain.   cholecalciferol 1000 units tablet Commonly known as:  VITAMIN D Take 2,000 Units by mouth daily. Take 2 capsules daily for vitamin D supplement   diclofenac sodium 1 % Gel Commonly known as:  VOLTAREN Apply 2 g topically 4 (four) times daily.   EQUATE STOOL SOFTENER 100 MG capsule Generic drug:  docusate sodium Take 100 mg by mouth at bedtime.   famotidine 20 MG tablet Commonly known as:  PEPCID Take 20 mg by mouth daily as needed for heartburn or indigestion. Take one tablet as needed for reflux.   Insulin Isophane & Regular Human (70-30) 100 UNIT/ML PEN Commonly known as:  HUMULIN 70/30 KWIKPEN Inject 20 units each mornig   Insulin Pen Needle 31G X 8 MM Misc Commonly known as:  B-D ULTRAFINE III SHORT PEN Use two needles once daily for insulin injections.   LANTUS SOLOSTAR 100 UNIT/ML Solostar Pen Generic drug:   Insulin Glargine INJECT SUBCUTANEOUSLY 40  UNITS AT BEDTIME FOR  DIABETES   metoprolol tartrate 50 MG tablet Commonly known as:  LOPRESSOR Take 1 tablet (50 mg total) by mouth 2 (two) times daily.   multivitamin with minerals Tabs tablet Take 1 tablet by mouth daily.   PROBIOTIC-10 Chew Chew 1 each by mouth daily.   tamsulosin 0.4 MG Caps capsule Commonly known as:  FLOMAX TAKE 1 CAPSULE BY MOUTH  DAILY FOR PROSTATE       Review of Systems:  Review of Systems  Constitutional: Positive for malaise/fatigue. Negative for chills and fever.  HENT: Positive for hearing loss.   Eyes: Positive for blurred vision.  Respiratory: Negative for cough and shortness of breath.   Cardiovascular: Negative for chest pain, palpitations and leg swelling.  Gastrointestinal: Negative for abdominal pain, blood in stool, constipation and melena.  Genitourinary: Negative for dysuria.  Musculoskeletal: Positive for joint pain, myalgias and neck pain. Negative for falls.  Skin: Negative for itching and rash.  Neurological: Positive for tingling, sensory change and weakness. Negative for dizziness and loss of consciousness.  Psychiatric/Behavioral: Positive for depression. Negative for memory loss.    Health Maintenance  Topic Date Due  . INFLUENZA VACCINE  08/22/2016  . FOOT EXAM  08/29/2016  . OPHTHALMOLOGY EXAM  08/30/2016  . HEMOGLOBIN A1C  03/07/2017  . URINE MICROALBUMIN  06/05/2017  . TETANUS/TDAP  08/22/2020  . PNA vac Low Risk Adult  Completed    Physical Exam: Vitals:   09/06/16 1052  BP: 130/80  Pulse: 81  Temp: 97.8 F (36.6 C)  TempSrc: Oral  SpO2: 97%  Weight: 166 lb (75.3 kg)   Body mass index is 25.24 kg/m. Physical Exam  Constitutional: He is oriented to person, place, and time. He appears well-developed. No distress.  Eyes:  glasses  Cardiovascular: Normal rate, regular rhythm, normal heart sounds and intact distal pulses.   Pulmonary/Chest: Effort normal and  breath sounds normal. No respiratory distress.  Abdominal: Soft. Bowel sounds are normal.  Musculoskeletal:  Unable to get up out of chair w/o using arms, knees, back and whole right leg ache  Neurological: He is alert and oriented to person, place, and time.  Skin: Skin is warm and dry.    Labs reviewed: Basic Metabolic Panel:  Recent Labs  11/23/15 1650  08/01/16 1410 08/17/16 1909 09/04/16 0843  NA 140  < > 138 139 142  K 4.8  < > 4.8 4.1 4.8  CL 107  < > 106 108 109  CO2 24  < > 22 23 22   GLUCOSE 145*  < > 136* 145* 143*  BUN 12  < > 13 15 10   CREATININE 1.18*  < > 0.94 0.89 0.92  CALCIUM 10.7*  < > 11.1* 10.9* 10.7*  TSH 1.64  --  1.30  --   --   < > = values in this interval not displayed. Liver Function Tests:  Recent Labs  06/05/16 0839 08/01/16 1410 09/04/16 0843  AST 16 17 17   ALT 11 14 16   ALKPHOS 87 93 78  BILITOT 0.5 0.5 0.4  PROT 6.3 6.4 5.9*  ALBUMIN 4.2 4.4 4.2   No results for input(s): LIPASE, AMYLASE in the last 8760 hours. No results for input(s): AMMONIA in the last 8760 hours. CBC:  Recent Labs  11/23/15 1650 08/01/16 1410  WBC 11.3* 5.6  NEUTROABS 8,588* 3,360  HGB 13.7 13.3  HCT 40.3 39.2  MCV 88.8 88.3  PLT 222 229   Lipid Panel:  Recent Labs  01/06/16 0832 09/04/16 0843  CHOL 228* 254*  HDL 44 35*  LDLCALC 155* 162*  TRIG 147 285*  CHOLHDL 5.2* 7.3*   Lab Results  Component Value Date   HGBA1C 7.8 (H) 09/04/2016    Procedures since last visit: Dg Tibia/fibula Right  Result Date: 08/17/2016 CLINICAL DATA:  Right knee instability. Right knee surgery approximately 40 years ago. EXAM: RIGHT TIBIA AND FIBULA - 2 VIEW COMPARISON:  Right knee radiographs obtained at the same time. FINDINGS: Mild  right knee degenerative changes. Otherwise, unremarkable tibia and fibula. Arterial calcifications. IMPRESSION: No acute abnormality. Electronically Signed   By: Claudie Revering M.D.   On: 08/17/2016 19:11   Dg Knee Complete 4 Views  Right  Result Date: 08/17/2016 CLINICAL DATA:  Right knee instability. Right knee surgery approximately 40 years ago. EXAM: RIGHT KNEE - COMPLETE 4+ VIEW COMPARISON:  Right lower leg radiographs obtained at the same time. FINDINGS: Moderate to marked medial joint space narrowing and mild associated spur formation. Mild to moderate posterior patellar spur formation. Small effusion. IMPRESSION: Medial compartment and patellofemoral degenerative changes and small effusion. Electronically Signed   By: Claudie Revering M.D.   On: 08/17/2016 19:13    Assessment/Plan 1. Primary hyperparathyroidism ([place]) - ENT does not do this surgery so will send on to gen surgery - Ambulatory referral to General Surgery - COMPLETE METABOLIC PANEL WITH GFR; Future - PTH, intact and calcium; Future  2. DM (diabetes mellitus), type 2 with peripheral vascular complications ([facility]) - cont current regimen and monitor - Hemoglobin A1c; Future  3. Malignant neoplasm of prostate ([place]) -f/u with Dr. Risa Grill for worsening urinary symptoms  4. Chronic midline low back pain without sciatica -ongoing, seems all of his arthritis pains are much worse lately, suspect due to his hyperparathyroidism  5. Generalized osteoarthritis of multiple sites -bilateral knees and back, legs, cont tylenol, does not like stronger meds  6. Age-related osteoporosis without current pathological fracture -noted on bone density by endocrine -hyperpara is leaching ca from his bones  Labs/tests ordered:   Orders Placed This Encounter  Procedures  . Hemoglobin A1c    Standing Status:   Future    Standing Expiration Date:   05/07/2017  . COMPLETE METABOLIC PANEL WITH GFR    Standing Status:   Future    Standing Expiration Date:   05/07/2017  . PTH, intact and calcium    Standing Status:   Future    Standing Expiration Date:   05/07/2017  . Ambulatory referral to General Surgery    Referral Priority:   Routine    Referral Type:   Surgical     Referral Reason:   Specialty Services Required    Requested Specialty:   General Surgery    Number of Visits Requested:   1   Next appt:  01/17/2017 med mgt, labs before  [place]. Johnna Bollier, D.O. [facility] [address], [place] [address] Cell Phone (Mon-Fri 8am-5pm):  [phone] On Call:  [phone] & follow prompts after 5pm & weekends Office Phone:  [phone] Office Fax:  [phone]

## 2016-09-14 ENCOUNTER — Other Ambulatory Visit: Payer: Self-pay | Admitting: Internal Medicine

## 2016-10-01 DIAGNOSIS — E210 Primary hyperparathyroidism: Secondary | ICD-10-CM | POA: Diagnosis not present

## 2016-10-02 ENCOUNTER — Other Ambulatory Visit (HOSPITAL_COMMUNITY): Payer: Self-pay | Admitting: Surgery

## 2016-10-02 DIAGNOSIS — E210 Primary hyperparathyroidism: Secondary | ICD-10-CM

## 2016-10-03 DIAGNOSIS — E210 Primary hyperparathyroidism: Secondary | ICD-10-CM | POA: Diagnosis not present

## 2016-10-13 ENCOUNTER — Other Ambulatory Visit: Payer: Self-pay | Admitting: Internal Medicine

## 2016-10-13 DIAGNOSIS — I471 Supraventricular tachycardia: Secondary | ICD-10-CM

## 2016-10-15 ENCOUNTER — Other Ambulatory Visit: Payer: Self-pay | Admitting: *Deleted

## 2016-10-15 DIAGNOSIS — E1151 Type 2 diabetes mellitus with diabetic peripheral angiopathy without gangrene: Secondary | ICD-10-CM

## 2016-10-15 DIAGNOSIS — IMO0002 Reserved for concepts with insufficient information to code with codable children: Secondary | ICD-10-CM

## 2016-10-15 DIAGNOSIS — E1165 Type 2 diabetes mellitus with hyperglycemia: Principal | ICD-10-CM

## 2016-10-15 DIAGNOSIS — E1140 Type 2 diabetes mellitus with diabetic neuropathy, unspecified: Secondary | ICD-10-CM

## 2016-10-15 MED ORDER — INSULIN ISOPHANE & REGULAR (HUMAN 70-30)100 UNIT/ML KWIKPEN: PEN_INJECTOR | SUBCUTANEOUS | 3 refills | Status: DC

## 2016-10-15 MED ORDER — INSULIN PEN NEEDLE 31G X 8 MM MISC: 3 refills | Status: DC

## 2016-10-15 NOTE — Telephone Encounter (Signed)
[facility] 

## 2016-10-16 ENCOUNTER — Encounter (HOSPITAL_COMMUNITY)
Admission: RE | Admit: 2016-10-16 | Discharge: 2016-10-16 | Disposition: A | Discharge status: Home / Self Care | Payer: Medicare Other | Source: Ambulatory Visit | Attending: Surgery | Admitting: Surgery

## 2016-10-16 ENCOUNTER — Ambulatory Visit (HOSPITAL_COMMUNITY)
Admission: RE | Admit: 2016-10-16 | Discharge: 2016-10-16 | Disposition: A | Discharge status: Home / Self Care | Payer: Medicare Other | Source: Ambulatory Visit | Attending: Surgery | Admitting: Surgery

## 2016-10-16 DIAGNOSIS — E210 Primary hyperparathyroidism: Secondary | ICD-10-CM | POA: Insufficient documentation

## 2016-10-16 DIAGNOSIS — E0590 Thyrotoxicosis, unspecified without thyrotoxic crisis or storm: Secondary | ICD-10-CM | POA: Diagnosis not present

## 2016-10-16 MED ORDER — TECHNETIUM TC 99M SESTAMIBI GENERIC - CARDIOLITE: 23.0000 | Freq: Once | INTRAVENOUS | Status: DC | PRN

## 2016-10-19 DIAGNOSIS — Z23 Encounter for immunization: Secondary | ICD-10-CM | POA: Diagnosis not present

## 2016-10-30 ENCOUNTER — Other Ambulatory Visit: Payer: Self-pay | Admitting: Surgery

## 2016-10-30 DIAGNOSIS — E210 Primary hyperparathyroidism: Secondary | ICD-10-CM

## 2016-10-31 ENCOUNTER — Encounter: Payer: Self-pay | Admitting: Internal Medicine

## 2016-11-01 ENCOUNTER — Other Ambulatory Visit: Payer: Medicare Other

## 2016-11-07 ENCOUNTER — Ambulatory Visit
Admission: RE | Admit: 2016-11-07 | Discharge: 2016-11-07 | Disposition: A | Discharge status: Home / Self Care | Payer: Medicare Other | Source: Ambulatory Visit | Attending: Surgery | Admitting: Surgery

## 2016-11-07 DIAGNOSIS — E210 Primary hyperparathyroidism: Secondary | ICD-10-CM

## 2016-11-07 DIAGNOSIS — E042 Nontoxic multinodular goiter: Secondary | ICD-10-CM | POA: Diagnosis not present

## 2016-11-20 ENCOUNTER — Other Ambulatory Visit: Payer: Self-pay | Admitting: Surgery

## 2016-11-20 DIAGNOSIS — E210 Primary hyperparathyroidism: Secondary | ICD-10-CM

## 2016-11-20 DIAGNOSIS — M1711 Unilateral primary osteoarthritis, right knee: Secondary | ICD-10-CM | POA: Diagnosis not present

## 2016-11-22 ENCOUNTER — Ambulatory Visit
Admission: RE | Admit: 2016-11-22 | Discharge: 2016-11-22 | Disposition: A | Discharge status: Home / Self Care | Payer: Medicare Other | Source: Ambulatory Visit | Attending: Surgery | Admitting: Surgery

## 2016-11-22 DIAGNOSIS — E210 Primary hyperparathyroidism: Secondary | ICD-10-CM

## 2016-11-22 DIAGNOSIS — E0590 Thyrotoxicosis, unspecified without thyrotoxic crisis or storm: Secondary | ICD-10-CM | POA: Diagnosis not present

## 2016-11-22 MED ORDER — IOPAMIDOL (ISOVUE-370) INJECTION 76%
75.0000 mL | Freq: Once | INTRAVENOUS | Status: AC | PRN
  Administered 2016-11-22: 75 mL via INTRAVENOUS

## 2016-11-24 DIAGNOSIS — M25561 Pain in right knee: Secondary | ICD-10-CM | POA: Diagnosis not present

## 2016-11-26 ENCOUNTER — Ambulatory Visit: Payer: Self-pay | Admitting: Surgery

## 2016-11-27 DIAGNOSIS — C61 Malignant neoplasm of prostate: Secondary | ICD-10-CM | POA: Diagnosis not present

## 2016-11-28 DIAGNOSIS — M1711 Unilateral primary osteoarthritis, right knee: Secondary | ICD-10-CM | POA: Diagnosis not present

## 2016-12-11 DIAGNOSIS — C61 Malignant neoplasm of prostate: Secondary | ICD-10-CM | POA: Diagnosis not present

## 2016-12-11 DIAGNOSIS — N5082 Scrotal pain: Secondary | ICD-10-CM | POA: Diagnosis not present

## 2016-12-11 DIAGNOSIS — N3941 Urge incontinence: Secondary | ICD-10-CM | POA: Diagnosis not present

## 2016-12-20 ENCOUNTER — Other Ambulatory Visit: Payer: Self-pay | Admitting: *Deleted

## 2016-12-20 DIAGNOSIS — E1151 Type 2 diabetes mellitus with diabetic peripheral angiopathy without gangrene: Secondary | ICD-10-CM

## 2016-12-20 MED ORDER — INSULIN PEN NEEDLE 31G X 8 MM MISC: 3 refills | Status: DC

## 2016-12-20 NOTE — Telephone Encounter (Signed)
Micheal [name] with [employer] called and stated that the directions for the pen needles needed to be changed to Use twice. Faxed.

## 2016-12-21 NOTE — Progress Notes (Signed)
ECHO 08-09-16 epic  EKG 08-01-16 epic   Roberto Scales NP 08-01-16 epic

## 2016-12-21 NOTE — Patient Instructions (Signed)
Micheal [name]  12/21/2016   Your procedure is scheduled on: 12-27-17  Report to [facility] Main  Entrance Take [place]  elevators to 3rd floor to  [place] at [phone].    Call this number if you have problems the morning of surgery [phone]    Remember: ONLY 1 PERSON MAY GO WITH YOU TO SHORT STAY TO GET  READY MORNING OF Micheal [name].    Do not eat food or drink liquids :After Midnight.     Take these medicines the morning of surgery with A SIP OF WATER: metoprolol, tamsulosin, Claritin, tylenol if needed                                You may not have any metal on your body including hair pins and              piercings  Do not wear jewelry, make-up, lotions, powders or perfumes, deodorant                       Men may shave face and neck.   Do not bring valuables to the hospital. [place].  Contacts, dentures or bridgework may not be worn into surgery.      Patients discharged the day of surgery will not be allowed to drive home.  Name and phone number of your driver:  Special Instructions: N/A              Please read over the following fact sheets you were given: _____________________________________________________________________            [facility] - Preparing for Surgery Before surgery, you can play an important role.  Because skin is not sterile, your skin needs to be as free of germs as possible.  You can reduce the number of germs on your skin by washing with CHG (chlorahexidine gluconate) soap before surgery.  CHG is an antiseptic cleaner which kills germs and bonds with the skin to continue killing germs even after washing. Please DO NOT use if you have an allergy to CHG or antibacterial soaps.  If your skin becomes reddened/irritated stop using the CHG and inform your nurse when you arrive at Short Stay. Do not shave (including legs and underarms) for at least 48 hours prior  to the first CHG shower.  You may shave your face/neck. Please follow these instructions carefully:  1.  Shower with CHG Soap the night before surgery and the  morning of Surgery.  2.  If you choose to wash your hair, wash your hair first as usual with your  normal  shampoo.  3.  After you shampoo, rinse your hair and body thoroughly to remove the  shampoo.                           4.  Use CHG as you would any other liquid soap.  You can apply chg directly  to the skin and wash                       Gently with a scrungie or clean washcloth.  5.  Apply the CHG  Soap to your body ONLY FROM THE NECK DOWN.   Do not use on face/ open                           Wound or open sores. Avoid contact with eyes, ears mouth and genitals (private parts).                       Wash face,  Genitals (private parts) with your normal soap.             6.  Wash thoroughly, paying special attention to the area where your surgery  will be performed.  7.  Thoroughly rinse your body with warm water from the neck down.  8.  DO NOT shower/wash with your normal soap after using and rinsing off  the CHG Soap.                9.  Pat yourself dry with a clean towel.            10.  Wear clean pajamas.            11.  Place clean sheets on your bed the night of your first shower and do not  sleep with pets. Day of Surgery : Do not apply any lotions/deodorants the morning of surgery.  Please wear clean clothes to the hospital/surgery center.  FAILURE TO FOLLOW THESE INSTRUCTIONS MAY RESULT IN THE CANCELLATION OF YOUR SURGERY PATIENT SIGNATURE_________________________________  NURSE SIGNATURE__________________________________  ________________________________________________________________________    How to Manage Your Diabetes Before and After Surgery  Why is it important to control my blood sugar before and after surgery? . Improving blood sugar levels before and after surgery helps healing and can limit  problems. . A way of improving blood sugar control is eating a healthy diet by: o  Eating less sugar and carbohydrates o  Increasing activity/exercise o  Talking with your doctor about reaching your blood sugar goals . High blood sugars (greater than 180 mg/dL) can raise your risk of infections and slow your recovery, so you will need to focus on controlling your diabetes during the weeks before surgery. . Make sure that the doctor who takes care of your diabetes knows about your planned surgery including the date and location.  How do I manage my blood sugar before surgery? . Check your blood sugar at least 4 times a day, starting 2 days before surgery, to make sure that the level is not too high or low. o Check your blood sugar the morning of your surgery when you wake up and every 2 hours until you get to the Short Stay unit. . If your blood sugar is less than 70 mg/dL, you will need to treat for low blood sugar: o Do not take insulin. o Treat a low blood sugar (less than 70 mg/dL) with  cup of clear juice (cranberry or apple), 4 glucose tablets, OR glucose gel. o Recheck blood sugar in 15 minutes after treatment (to make sure it is greater than 70 mg/dL). If your blood sugar is not greater than 70 mg/dL on recheck, call [phone] for further instructions. . Report your blood sugar to the short stay nurse when you get to Short Stay.  . If you are admitted to the hospital after surgery: o Your blood sugar will be checked by the staff and you will probably be given insulin after surgery (instead of oral diabetes medicines) to  make sure you have good blood sugar levels. o The goal for blood sugar control after surgery is 80-180 mg/dL.   WHAT DO I DO ABOUT MY DIABETES MEDICATION?   . THE DAY BEFORE SURGERY o take  14   units of    HUMULIN 70-30   insulin.  o take 20 units of  LANTUS insulin       . THE MORNING OF SURGERY,  o DO NOT TAKE HUMULIN 70/30 INSULIN !!!    Patient  Signature:  Date:   Nurse Signature:  Date:   Reviewed and Endorsed by [facility] Patient Education Committee, August 2015

## 2016-12-24 ENCOUNTER — Encounter (HOSPITAL_COMMUNITY): Payer: Self-pay | Admitting: Surgery

## 2016-12-24 NOTE — H&P (Signed)
General Surgery [facility]  Micheal [name] DOB: [birth year] Married / Language: Cleophus Molt / Race: White Male   History of Present Illness   The patient is a 83 year old male who presents with primary hyperparathyroidism.  CC: primary hyperparathyroidism  Patient is referred by Dr. Hollace Kinnier for evaluation of suspected primary hyperparathyroidism. Patient's endocrinologist is Dr. Renato Shin. Patient was noted on routine laboratory studies over the past year to have elevated serum calcium levels. Calcium level has ranged from 10.6-11.7 most recently. Recent intact PTH levels have ranged from 108-114. Vitamin D level is actually normal at 40. Patient has complained of chronic fatigue which is getting worse. He complains of joint pain, particularly in the right knee. Patient has undergone previous bone density scanning with evidence of osteoporosis. He denies any history of nephrolithiasis. He does have urinary frequency but has a history of prostate cancer treated with radiation. Patient has had previous vocal cord surgery as a teenager. He has had no other head or neck surgery. There is no family history of parathyroid disease or endocrine neoplasms. Patient has not had any diagnostic imaging. He has not had a 24-hour urine collection for calcium. He presents today accompanied by his wife to discuss further evaluation and hopefully surgical management.   Past Surgical History  Appendectomy  Cataract Surgery  Bilateral. Gallbladder Surgery - Laparoscopic  Knee Surgery  Left. Oral Surgery  Shoulder Surgery  Right. Tonsillectomy   Diagnostic Studies History  Colonoscopy  1-5 years ago  Medication History  Tamsulosin HCl (Oral) Specific strength unknown - Active. Acetaminophen (500MG  Tablet, 1000mg  Oral) Active. Vitamin D (2000UNIT Tablet, Oral) Active. Pepcid (20MG  Tablet, Oral) Active. Probiotic Product (Oral) Active. Diclofenac  Sodium (2% Solution, Transdermal) Active. Lantus SoloStar (100UNIT/ML Soln Pen-inj, Subcutaneous) Active. Metoprolol Tartrate (50MG  Tablet, Oral) Active. Insulin Pen Needle (30G X 8 MM Misc,) Active. HumuLIN 70/30 Pen ((70-30) 100UNIT/ML Suspension, Subcutaneous) Active. Medications Reconciled  Social History Caffeine use  Coffee. No drug use   Family History  Alcohol Abuse  Brother. Arthritis  Mother, Sister. Diabetes Mellitus  Mother, Sister.  Other Problems  Arthritis  Back Pain  Diabetes Mellitus  Diverticulosis  Gastroesophageal Reflux Disease  Hypercholesterolemia  Prostate Cancer  Thyroid Disease     Review of Systems General Present- Fatigue and Weight Gain. Not Present- Appetite Loss, Chills, Fever, Night Sweats and Weight Loss. HEENT Present- Hearing Loss, Hoarseness, Seasonal Allergies and Wears glasses/contact lenses. Not Present- Earache, Nose Bleed, Oral Ulcers, Ringing in the Ears, Sinus Pain, Sore Throat, Visual Disturbances and Yellow Eyes. Cardiovascular Present- Rapid Heart Rate, Shortness of Breath and Swelling of Extremities. Not Present- Chest Pain, Difficulty Breathing Lying Down, Leg Cramps and Palpitations. Gastrointestinal Present- Bloating and Excessive gas. Not Present- Abdominal Pain, Bloody Stool, Change in Bowel Habits, Chronic diarrhea, Constipation, Difficulty Swallowing, Gets full quickly at meals, Hemorrhoids, Indigestion, Nausea, Rectal Pain and Vomiting. Male Genitourinary Present- Impotence, Nocturia, Urgency and Urine Leakage. Not Present- Blood in Urine, Change in Urinary Stream, Frequency and Painful Urination.  Vitals  Weight: 164 lb Height: 67in Body Surface Area: 1.86 m Body Mass Index: 25.69 kg/m  Temp.: 97.33F  Pulse: 78 (Regular)  BP: 126/84 (Sitting, Left Arm, Standard)  Physical Exam  See vital signs recorded above  GENERAL APPEARANCE Development: normal Nutritional status: normal Gross  deformities: none  SKIN Rash, lesions, ulcers: none Induration, erythema: none Nodules: none palpable  EYES Conjunctiva and lids: normal Pupils: equal and reactive Iris: normal bilaterally  EARS,  NOSE, MOUTH, THROAT External ears: no lesion or deformity External nose: no lesion or deformity Hearing: grossly normal Lips: no lesion or deformity Dentition: normal for age Oral mucosa: moist  NECK Symmetric: yes Trachea: midline Thyroid: no palpable nodules in the thyroid bed  CHEST Respiratory effort: normal Retraction or accessory muscle use: no Breath sounds: normal bilaterally Rales, rhonchi, wheeze: none  CARDIOVASCULAR Auscultation: regular rhythm, normal rate Murmurs: none Pulses: carotid and radial pulse 2+ palpable Lower extremity edema: none Lower extremity varicosities: none  MUSCULOSKELETAL Station and gait: normal Digits and nails: no clubbing or cyanosis Muscle strength: grossly normal all extremities Range of motion: grossly normal all extremities Deformity: none  LYMPHATIC Cervical: none palpable Supraclavicular: none palpable  PSYCHIATRIC Oriented to person, place, and time: yes Mood and affect: normal for situation Judgment and insight: appropriate for situation    Assessment & Plan  PRIMARY HYPERPARATHYROIDISM (E21.0)  Pt Education - Pamphlet Given - The Parathyroid Surgery Book: discussed with patient and provided information. Follow Up - Call [facility] office after tests / studies doneto discuss further plans  Patient is referred by his primary care physician and his endocrinologist for evaluation and management of primary hyperparathyroidism. He is accompanied by his wife. They are provided with written literature on parathyroid disease to review at home.  Patient has biochemical evidence of primary hyperparathyroidism. He is symptomatic. I have recommended obtaining a 24-hour urine collection for calcium. I have also recommended  proceeding with nuclear medicine parathyroid scanning in hopes of confirming the diagnosis and localizing a parathyroid adenoma. If this study is successful, the patient will likely be a good candidate for minimally invasive parathyroid surgery. If the nuclear medicine scan is unrevealing, then we will obtain a high-resolution ultrasound examination of the neck. If this does not reveal a thyroid adenoma, then we will obtain a 4D CT scan of the neck.  We discussed operative intervention with minimally invasive surgery. We discussed the hospital stay to be anticipated. We discussed his postoperative recovery.  Patient and his wife understand the plan for further evaluation and management. Theywish to proceed. We will contact them when the results of his studies are available.  ADDENDUM:  Initial diagnostic studies were unrevealing.  CT scan however reveals a left inferior parathyroid adenoma.  Discussed with patient.  We will plan to proceed with minimally invasive surgery.  The risks and benefits of the procedure have been discussed at length with the patient.  The patient understands the proposed procedure, potential alternative treatments, and the course of recovery to be expected.  All of the patient's questions have been answered at this time.  The patient wishes to proceed with surgery.  Armandina Gemma, [place] [facility] Office: [phone]

## 2016-12-25 ENCOUNTER — Other Ambulatory Visit: Payer: Self-pay

## 2016-12-25 ENCOUNTER — Encounter (HOSPITAL_COMMUNITY): Payer: Self-pay

## 2016-12-25 ENCOUNTER — Encounter (INDEPENDENT_AMBULATORY_CARE_PROVIDER_SITE_OTHER): Payer: Self-pay

## 2016-12-25 ENCOUNTER — Encounter (HOSPITAL_COMMUNITY)
Admission: RE | Admit: 2016-12-25 | Discharge: 2016-12-25 | Disposition: A | Discharge status: Home / Self Care | Payer: Medicare Other | Source: Ambulatory Visit | Attending: Surgery | Admitting: Surgery

## 2016-12-25 DIAGNOSIS — K219 Gastro-esophageal reflux disease without esophagitis: Secondary | ICD-10-CM | POA: Diagnosis not present

## 2016-12-25 DIAGNOSIS — E1151 Type 2 diabetes mellitus with diabetic peripheral angiopathy without gangrene: Secondary | ICD-10-CM | POA: Diagnosis not present

## 2016-12-25 DIAGNOSIS — I10 Essential (primary) hypertension: Secondary | ICD-10-CM | POA: Diagnosis not present

## 2016-12-25 DIAGNOSIS — E210 Primary hyperparathyroidism: Secondary | ICD-10-CM | POA: Diagnosis not present

## 2016-12-25 DIAGNOSIS — Z8546 Personal history of malignant neoplasm of prostate: Secondary | ICD-10-CM | POA: Diagnosis not present

## 2016-12-25 DIAGNOSIS — Z79899 Other long term (current) drug therapy: Secondary | ICD-10-CM | POA: Diagnosis not present

## 2016-12-25 DIAGNOSIS — Z794 Long term (current) use of insulin: Secondary | ICD-10-CM | POA: Diagnosis not present

## 2016-12-25 DIAGNOSIS — M810 Age-related osteoporosis without current pathological fracture: Secondary | ICD-10-CM | POA: Diagnosis not present

## 2016-12-25 DIAGNOSIS — E119 Type 2 diabetes mellitus without complications: Secondary | ICD-10-CM | POA: Diagnosis not present

## 2016-12-25 LAB — BASIC METABOLIC PANEL
Anion gap: 5 (ref 5–15)
BUN: 17 mg/dL (ref 6–20)
CO2: 25 mmol/L (ref 22–32)
Calcium: 10.5 mg/dL — ABNORMAL HIGH (ref 8.9–10.3)
Chloride: 108 mmol/L (ref 101–111)
Creatinine, Ser: 0.95 mg/dL (ref 0.61–1.24)
GFR calc Af Amer: >60 mL/min (ref >60)
GFR calc non Af Amer: >60 mL/min (ref >60)
Glucose, Bld: 158 mg/dL — ABNORMAL HIGH (ref 65–99)
Potassium: 4.7 mmol/L (ref 3.5–5.1)
Sodium: 138 mmol/L (ref 135–145)

## 2016-12-25 LAB — CBC
HCT: 38.6 % — ABNORMAL LOW (ref 39.0–52.0)
Hemoglobin: 12.9 g/dL — ABNORMAL LOW (ref 13.0–17.0)
MCH: 30.1 pg (ref 26.0–34.0)
MCHC: 33.4 g/dL (ref 30.0–36.0)
MCV: 90 fL (ref 78.0–100.0)
Platelets: 236 10*3/uL (ref 150–400)
RBC: 4.29 MIL/uL (ref 4.22–5.81)
RDW: 12.4 % (ref 11.5–15.5)
WBC: 5.1 10*3/uL (ref 4.0–10.5)

## 2016-12-25 LAB — HEMOGLOBIN A1C
Hgb A1c MFr Bld: 8.6 % — ABNORMAL HIGH (ref 4.8–5.6)
Mean Plasma Glucose: 200.12 mg/dL

## 2016-12-25 LAB — GLUCOSE, CAPILLARY: Glucose-Capillary: 171 mg/dL — ABNORMAL HIGH (ref 65–99)

## 2016-12-25 NOTE — Progress Notes (Signed)
HGBA1C ROUTED VIA Epic TO DR Harlow Asa

## 2016-12-27 ENCOUNTER — Encounter (HOSPITAL_COMMUNITY)
Admission: RE | Disposition: A | Discharge status: Home / Self Care | Payer: Self-pay | Source: Ambulatory Visit | Attending: Surgery

## 2016-12-27 ENCOUNTER — Ambulatory Visit (HOSPITAL_COMMUNITY): Payer: Medicare Other | Admitting: Anesthesiology

## 2016-12-27 ENCOUNTER — Encounter (HOSPITAL_COMMUNITY): Payer: Self-pay | Admitting: *Deleted

## 2016-12-27 ENCOUNTER — Other Ambulatory Visit: Payer: Self-pay

## 2016-12-27 ENCOUNTER — Ambulatory Visit (HOSPITAL_COMMUNITY)
Admission: RE | Admit: 2016-12-27 | Discharge: 2016-12-27 | Disposition: A | Discharge status: Home / Self Care | Payer: Medicare Other | Source: Ambulatory Visit | Attending: Surgery | Admitting: Surgery

## 2016-12-27 DIAGNOSIS — I10 Essential (primary) hypertension: Secondary | ICD-10-CM | POA: Insufficient documentation

## 2016-12-27 DIAGNOSIS — M810 Age-related osteoporosis without current pathological fracture: Secondary | ICD-10-CM | POA: Diagnosis not present

## 2016-12-27 DIAGNOSIS — K219 Gastro-esophageal reflux disease without esophagitis: Secondary | ICD-10-CM | POA: Diagnosis not present

## 2016-12-27 DIAGNOSIS — E210 Primary hyperparathyroidism: Secondary | ICD-10-CM | POA: Diagnosis present

## 2016-12-27 DIAGNOSIS — C61 Malignant neoplasm of prostate: Secondary | ICD-10-CM | POA: Diagnosis not present

## 2016-12-27 DIAGNOSIS — D351 Benign neoplasm of parathyroid gland: Secondary | ICD-10-CM | POA: Diagnosis not present

## 2016-12-27 DIAGNOSIS — Z794 Long term (current) use of insulin: Secondary | ICD-10-CM | POA: Diagnosis not present

## 2016-12-27 DIAGNOSIS — Z8546 Personal history of malignant neoplasm of prostate: Secondary | ICD-10-CM | POA: Insufficient documentation

## 2016-12-27 DIAGNOSIS — E785 Hyperlipidemia, unspecified: Secondary | ICD-10-CM | POA: Diagnosis not present

## 2016-12-27 DIAGNOSIS — E119 Type 2 diabetes mellitus without complications: Secondary | ICD-10-CM | POA: Insufficient documentation

## 2016-12-27 DIAGNOSIS — I471 Supraventricular tachycardia: Secondary | ICD-10-CM | POA: Diagnosis not present

## 2016-12-27 DIAGNOSIS — E1151 Type 2 diabetes mellitus with diabetic peripheral angiopathy without gangrene: Secondary | ICD-10-CM | POA: Diagnosis not present

## 2016-12-27 DIAGNOSIS — Z79899 Other long term (current) drug therapy: Secondary | ICD-10-CM | POA: Diagnosis not present

## 2016-12-27 HISTORY — PX: PARATHYROIDECTOMY: SHX19

## 2016-12-27 LAB — GLUCOSE, CAPILLARY
Glucose-Capillary: 126 mg/dL — ABNORMAL HIGH (ref 65–99)
Glucose-Capillary: 187 mg/dL — ABNORMAL HIGH (ref 65–99)

## 2016-12-27 SURGERY — PARATHYROIDECTOMY
Anesthesia: General | Laterality: Left

## 2016-12-27 MED ORDER — FENTANYL CITRATE (PF) 100 MCG/2ML IJ SOLN
25.0000 ug | INTRAMUSCULAR | Status: DC | PRN
  Administered 2016-12-27 (×2): 50 ug via INTRAVENOUS

## 2016-12-27 MED ORDER — ONDANSETRON HCL 4 MG/2ML IJ SOLN
INTRAMUSCULAR | Status: AC
  Filled 2016-12-27: qty 2

## 2016-12-27 MED ORDER — LACTATED RINGERS IV SOLN
INTRAVENOUS | Status: DC
  Administered 2016-12-27: 07:00:00 via INTRAVENOUS

## 2016-12-27 MED ORDER — ROCURONIUM BROMIDE 50 MG/5ML IV SOSY
PREFILLED_SYRINGE | INTRAVENOUS | Status: DC | PRN
  Administered 2016-12-27: 50 mg via INTRAVENOUS

## 2016-12-27 MED ORDER — EPHEDRINE 5 MG/ML INJ
INTRAVENOUS | Status: AC
  Filled 2016-12-27: qty 10

## 2016-12-27 MED ORDER — PROPOFOL 10 MG/ML IV BOLUS
INTRAVENOUS | Status: DC | PRN
  Administered 2016-12-27: 100 mg via INTRAVENOUS

## 2016-12-27 MED ORDER — CEFAZOLIN SODIUM-DEXTROSE 2-4 GM/100ML-% IV SOLN
2.0000 g | INTRAVENOUS | Status: AC
  Administered 2016-12-27: 2 g via INTRAVENOUS
  Filled 2016-12-27: qty 100

## 2016-12-27 MED ORDER — ONDANSETRON HCL 4 MG/2ML IJ SOLN
INTRAMUSCULAR | Status: DC | PRN
  Administered 2016-12-27: 4 mg via INTRAVENOUS

## 2016-12-27 MED ORDER — FENTANYL CITRATE (PF) 100 MCG/2ML IJ SOLN
INTRAMUSCULAR | Status: AC
  Filled 2016-12-27: qty 2

## 2016-12-27 MED ORDER — EPHEDRINE SULFATE 50 MG/ML IJ SOLN
INTRAMUSCULAR | Status: DC | PRN
  Administered 2016-12-27 (×2): 5 mg via INTRAVENOUS
  Administered 2016-12-27: 10 mg via INTRAVENOUS

## 2016-12-27 MED ORDER — LIDOCAINE 2% (20 MG/ML) 5 ML SYRINGE
INTRAMUSCULAR | Status: DC | PRN
  Administered 2016-12-27: 80 mg via INTRAVENOUS

## 2016-12-27 MED ORDER — SUGAMMADEX SODIUM 200 MG/2ML IV SOLN
INTRAVENOUS | Status: AC
  Filled 2016-12-27: qty 2

## 2016-12-27 MED ORDER — LIDOCAINE 2% (20 MG/ML) 5 ML SYRINGE
INTRAMUSCULAR | Status: AC
  Filled 2016-12-27: qty 5

## 2016-12-27 MED ORDER — BUPIVACAINE-EPINEPHRINE (PF) 0.25% -1:200000 IJ SOLN
INTRAMUSCULAR | Status: DC | PRN
  Administered 2016-12-27: 10 mL

## 2016-12-27 MED ORDER — 0.9 % SODIUM CHLORIDE (POUR BTL) OPTIME
TOPICAL | Status: DC | PRN
  Administered 2016-12-27: 1000 mL

## 2016-12-27 MED ORDER — FENTANYL CITRATE (PF) 100 MCG/2ML IJ SOLN: INTRAMUSCULAR | Status: DC | PRN

## 2016-12-27 MED ORDER — TRAMADOL HCL 50 MG PO TABS
50.0000 mg | ORAL_TABLET | Freq: Four times a day (QID) | ORAL | Status: DC | PRN
  Administered 2016-12-27: 50 mg via ORAL
  Filled 2016-12-27: qty 1

## 2016-12-27 MED ORDER — FENTANYL CITRATE (PF) 250 MCG/5ML IJ SOLN
INTRAMUSCULAR | Status: AC
  Filled 2016-12-27: qty 5

## 2016-12-27 MED ORDER — DEXAMETHASONE SODIUM PHOSPHATE 10 MG/ML IJ SOLN
INTRAMUSCULAR | Status: DC | PRN
  Administered 2016-12-27: 10 mg via INTRAVENOUS

## 2016-12-27 MED ORDER — TRAMADOL HCL 50 MG PO TABS: 50.0000 mg | ORAL_TABLET | Freq: Four times a day (QID) | ORAL | 0 refills | Status: DC | PRN

## 2016-12-27 MED ORDER — CHLORHEXIDINE GLUCONATE CLOTH 2 % EX PADS: 6.0000 | MEDICATED_PAD | Freq: Once | CUTANEOUS | Status: DC

## 2016-12-27 MED ORDER — SUGAMMADEX SODIUM 200 MG/2ML IV SOLN
INTRAVENOUS | Status: DC | PRN
  Administered 2016-12-27: 150 mg via INTRAVENOUS

## 2016-12-27 MED ORDER — DEXAMETHASONE SODIUM PHOSPHATE 10 MG/ML IJ SOLN
INTRAMUSCULAR | Status: AC
  Filled 2016-12-27: qty 1

## 2016-12-27 MED ORDER — FENTANYL CITRATE (PF) 250 MCG/5ML IJ SOLN
INTRAMUSCULAR | Status: DC | PRN
  Administered 2016-12-27: 100 ug via INTRAVENOUS
  Administered 2016-12-27: 25 ug via INTRAVENOUS

## 2016-12-27 MED ORDER — PROPOFOL 10 MG/ML IV BOLUS
INTRAVENOUS | Status: AC
  Filled 2016-12-27: qty 20

## 2016-12-27 MED ORDER — ROCURONIUM BROMIDE 50 MG/5ML IV SOSY
PREFILLED_SYRINGE | INTRAVENOUS | Status: AC
  Filled 2016-12-27: qty 5

## 2016-12-27 MED ORDER — BUPIVACAINE-EPINEPHRINE (PF) 0.25% -1:200000 IJ SOLN
INTRAMUSCULAR | Status: AC
  Filled 2016-12-27: qty 30

## 2016-12-27 SURGICAL SUPPLY — 39 items
ADH SKN CLS APL DERMABOND .7 (GAUZE/BANDAGES/DRESSINGS)
ATTRACTOMAT 16X20 MAGNETIC DRP (DRAPES) ×2 IMPLANT
BLADE HEX COATED 2.75 (ELECTRODE) ×2 IMPLANT
BLADE SURG 15 STRL LF DISP TIS (BLADE) ×1 IMPLANT
BLADE SURG 15 STRL SS (BLADE) ×2
CHLORAPREP W/TINT 26ML (MISCELLANEOUS) ×4 IMPLANT
CLIP VESOCCLUDE MED 6/CT (CLIP) ×4 IMPLANT
CLIP VESOCCLUDE SM WIDE 6/CT (CLIP) ×4 IMPLANT
DERMABOND ADVANCED (GAUZE/BANDAGES/DRESSINGS)
DERMABOND ADVANCED .7 DNX12 (GAUZE/BANDAGES/DRESSINGS) IMPLANT
DRAPE LAPAROTOMY T 98X78 PEDS (DRAPES) ×2 IMPLANT
ELECT PENCIL ROCKER SW 15FT (MISCELLANEOUS) ×2 IMPLANT
ELECT REM PT RETURN 15FT ADLT (MISCELLANEOUS) ×2 IMPLANT
GAUZE SPONGE 4X4 12PLY STRL (GAUZE/BANDAGES/DRESSINGS) ×1 IMPLANT
GAUZE SPONGE 4X4 16PLY XRAY LF (GAUZE/BANDAGES/DRESSINGS) ×2 IMPLANT
GLOVE SURG ORTHO 8.0 STRL STRW (GLOVE) ×2 IMPLANT
GOWN STRL REUS W/TWL XL LVL3 (GOWN DISPOSABLE) ×6 IMPLANT
HEMOSTAT SURGICEL 2X4 FIBR (HEMOSTASIS) ×1 IMPLANT
ILLUMINATOR WAVEGUIDE N/F (MISCELLANEOUS) IMPLANT
KIT BASIN OR (CUSTOM PROCEDURE TRAY) ×2 IMPLANT
LIGHT WAVEGUIDE WIDE FLAT (MISCELLANEOUS) IMPLANT
NDL HYPO 25X1 1.5 SAFETY (NEEDLE) ×1 IMPLANT
NEEDLE HYPO 25X1 1.5 SAFETY (NEEDLE) ×2 IMPLANT
PACK BASIC VI WITH GOWN DISP (CUSTOM PROCEDURE TRAY) ×2 IMPLANT
POWDER SURGICEL 3.0 GRAM (HEMOSTASIS) IMPLANT
STAPLER VISISTAT 35W (STAPLE) ×2 IMPLANT
STRIP CLOSURE SKIN 1/2X4 (GAUZE/BANDAGES/DRESSINGS) ×1 IMPLANT
SUT MNCRL AB 4-0 PS2 18 (SUTURE) ×2 IMPLANT
SUT SILK 2 0 (SUTURE)
SUT SILK 2-0 18XBRD TIE 12 (SUTURE) IMPLANT
SUT SILK 3 0 (SUTURE)
SUT SILK 3-0 18XBRD TIE 12 (SUTURE) IMPLANT
SUT VIC AB 3-0 SH 18 (SUTURE) ×2 IMPLANT
SYR BULB IRRIGATION 50ML (SYRINGE) ×2 IMPLANT
SYR CONTROL 10ML LL (SYRINGE) ×2 IMPLANT
TAPE PAPER MEDFIX 1IN X 10YD (GAUZE/BANDAGES/DRESSINGS) ×1 IMPLANT
TOWEL OR 17X26 10 PK STRL BLUE (TOWEL DISPOSABLE) ×2 IMPLANT
TOWEL OR NON WOVEN STRL DISP B (DISPOSABLE) ×2 IMPLANT
YANKAUER SUCT BULB TIP 10FT TU (MISCELLANEOUS) ×2 IMPLANT

## 2016-12-27 NOTE — Discharge Instructions (Signed)
General Anesthesia, Adult, Care After °These instructions provide you with information about caring for yourself after your procedure. Your health care provider may also give you more specific instructions. Your treatment has been planned according to current medical practices, but problems sometimes occur. Call your health care provider if you have any problems or questions after your procedure. °What can I expect after the procedure? °After the procedure, it is common to have: °· Vomiting. °· A sore throat. °· Mental slowness. ° °It is common to feel: °· Nauseous. °· Cold or shivery. °· Sleepy. °· Tired. °· Sore or achy, even in parts of your body where you did not have surgery. ° °Follow these instructions at home: °For at least 24 hours after the procedure: °· Do not: °? Participate in activities where you could fall or become injured. °? Drive. °? Use heavy machinery. °? Drink alcohol. °? Take sleeping pills or medicines that cause drowsiness. °? Make important decisions or sign legal documents. °? Take care of children on your own. °· Rest. °Eating and drinking °· If you vomit, drink water, juice, or soup when you can drink without vomiting. °· Drink enough fluid to keep your urine clear or pale yellow. °· Make sure you have little or no nausea before eating solid foods. °· Follow the diet recommended by your health care provider. °General instructions °· Have a responsible adult stay with you until you are awake and alert. °· Return to your normal activities as told by your health care provider. Ask your health care provider what activities are safe for you. °· Take over-the-counter and prescription medicines only as told by your health care provider. °· If you smoke, do not smoke without supervision. °· Keep all follow-up visits as told by your health care provider. This is important. °Contact a health care provider if: °· You continue to have nausea or vomiting at home, and medicines are not helpful. °· You  cannot drink fluids or start eating again. °· You cannot urinate after 8-12 hours. °· You develop a skin rash. °· You have fever. °· You have increasing redness at the site of your procedure. °Get help right away if: °· You have difficulty breathing. °· You have chest pain. °· You have unexpected bleeding. °· You feel that you are having a life-threatening or urgent problem. °This information is not intended to replace advice given to you by your health care provider. Make sure you discuss any questions you have with your health care provider. °Document Released: 04/16/2000 Document Revised: 06/13/2015 Document Reviewed: 12/23/2014 °Elsevier Interactive Patient Education © 2018 [employer] ° °

## 2016-12-27 NOTE — Anesthesia Postprocedure Evaluation (Signed)
Anesthesia Post Note  Patient: Micheal [name]  Procedure(s) Performed: LEFT INFERIOR PARATHYROIDECTOMY (Left )     Patient location during evaluation: PACU Anesthesia Type: General Level of consciousness: awake and alert Pain management: pain level controlled Vital Signs Assessment: post-procedure vital signs reviewed and stable Respiratory status: spontaneous breathing, nonlabored ventilation, respiratory function stable and patient connected to nasal cannula oxygen Cardiovascular status: blood pressure returned to baseline and stable Postop Assessment: no apparent nausea or vomiting Anesthetic complications: no    Last Vitals:  Vitals:   12/27/16 1046 12/27/16 1128  BP: (!) 141/76 138/63  Pulse:    Resp: 16 16  Temp: (!) 36.3 C (!) 36.2 C  SpO2: 96% 94%    Last Pain:  Vitals:   12/27/16 1200  TempSrc:   PainSc: 4                  Carlia Bomkamp,JAMES TERRILL

## 2016-12-27 NOTE — Op Note (Signed)
OPERATIVE REPORT - PARATHYROIDECTOMY  Preoperative diagnosis: Primary hyperparathyroidism  Postop diagnosis: Same  Procedure: Left inferior minimally invasive parathyroidectomy  Surgeon:  Armandina Gemma, MD  Anesthesia: General endotracheal  Estimated blood loss: Minimal  Preparation: ChloraPrep  Indications: Patient is referred by Dr. Hollace Kinnier for evaluation of suspected primary hyperparathyroidism. Patient's endocrinologist is Dr. Renato Shin. Patient was noted on routine laboratory studies over the past year to have elevated serum calcium levels. Calcium level has ranged from 10.6-11.7 most recently. Recent intact PTH levels have ranged from 108-114. Vitamin D level is actually normal at 40. Patient has complained of chronic fatigue which is getting worse. He complains of joint pain, particularly in the right knee. Patient has undergone previous bone density scanning with evidence of osteoporosis. Localization studies included a 4D-CT scan which localized an adenoma to the left inferior position.  Procedure: The patient was prepared in the pre-operative holding area. The patient was brought to the operating room and placed in a supine position on the operating room table. Following administration of general anesthesia, the patient was positioned and then prepped and draped in the usual strict aseptic fashion. After ascertaining that an adequate level of anesthesia been achieved, a neck incision was made with a #15 blade. Dissection was carried through subcutaneous tissues and platysma. Hemostasis was obtained with the electrocautery. Skin flaps were developed circumferentially and a Weitlander retractor was placed for exposure.  Strap muscles were incised in the midline. Strap muscles were reflected lateralley exposing the thyroid lobe. With gentle blunt dissection the thyroid lobe was mobilized.  Dissection was carried through adipose tissue and an enlarged parathyroid gland was  identified. It was gently mobilized. Vascular structures were divided between small and medium ligaclips. Care was taken to avoid the recurrent laryngeal nerve and the esophagus. The parathyroid gland was completely excised. It was submitted to pathology where frozen section confirmed parathyroid tissue consistent with adenoma.  Neck was irrigated with warm saline and good hemostasis was noted. Fibrillar was placed in the operative field. Strap muscles were reapproximated in the midline with interrupted 3-0 Vicryl sutures. Platysma was closed with interrupted 3-0 Vicryl sutures. Skin was closed with a running 4-0 Monocryl subcuticular suture. Marcaine was infiltrated circumferentially. Wound was washed and dried and Steristrips were applied. Patient was awakened from anesthesia and brought to the recovery room. The patient tolerated the procedure well.   Armandina Gemma, MD [facility] Office: [phone]

## 2016-12-27 NOTE — Anesthesia Procedure Notes (Signed)
Procedure Name: Intubation Date/Time: 12/27/2016 8:38 AM Performed by: Dione Booze, CRNA Pre-anesthesia Checklist: Patient identified, Emergency Drugs available, Suction available and Patient being monitored Patient Re-evaluated:Patient Re-evaluated prior to induction Oxygen Delivery Method: Circle system utilized Preoxygenation: Pre-oxygenation with 100% oxygen Induction Type: IV induction Ventilation: Oral airway inserted - appropriate to patient size and Mask ventilation without difficulty Laryngoscope Size: Mac and 4 Grade View: Grade I Tube type: Oral Tube size: 7.5 mm Number of attempts: 1 Airway Equipment and Method: Stylet Placement Confirmation: breath sounds checked- equal and bilateral,  ETT inserted through vocal cords under direct vision and positive ETCO2 Secured at: 22 cm Tube secured with: Tape Dental Injury: Teeth and Oropharynx as per pre-operative assessment

## 2016-12-27 NOTE — Interval H&P Note (Signed)
History and Physical Interval Note:  12/27/2016 8:08 AM  Micheal [name]  has presented today for surgery, with the diagnosis of PRIMARY HYPERPARATHYROIDISM  The various methods of treatment have been discussed with the patient and family. After consideration of risks, benefits and other options for treatment, the patient has consented to    Procedure(s): LEFT INFERIOR PARATHYROIDECTOMY (Left) as a surgical intervention .    The patient's history has been reviewed, patient examined, no change in status, stable for surgery.  I have reviewed the patient's chart and labs.  Questions were answered to the patient's satisfaction.    Armandina Gemma, [facility] Office: [place]

## 2016-12-27 NOTE — Transfer of Care (Signed)
Immediate Anesthesia Transfer of Care Note  Patient: Micheal [name]  Procedure(s) Performed: LEFT INFERIOR PARATHYROIDECTOMY (Left )  Patient Location: PACU  Anesthesia Type:General  Level of Consciousness: awake, alert , oriented and patient cooperative  Airway & Oxygen Therapy: Patient Spontanous Breathing and Patient connected to face mask oxygen  Post-op Assessment: Report given to RN and Post -op Vital signs reviewed and stable  Post vital signs: Reviewed and stable  Last Vitals:  Vitals:   12/27/16 0709  BP: 122/61  Pulse: 69  Resp: 16  Temp: 36.6 C  SpO2: 99%    Last Pain:  Vitals:   12/27/16 0709  TempSrc: Oral      Patients Stated Pain Goal: 4 (20/94/70 9628)  Complications: No apparent anesthesia complications

## 2016-12-27 NOTE — Anesthesia Preprocedure Evaluation (Addendum)
Anesthesia Evaluation  Patient identified by MRN, date of birth, ID band Patient awake    Reviewed: Allergy & Precautions, NPO status , Patient's Chart, lab work & pertinent test results  Airway Mallampati: II  TM Distance: >3 FB Neck ROM: Limited    Dental  (+) Teeth Intact   Pulmonary shortness of breath,    breath sounds clear to auscultation       Cardiovascular hypertension, + Peripheral Vascular Disease  + dysrhythmias  Rhythm:Regular Rate:Normal     Neuro/Psych  Neuromuscular disease    GI/Hepatic GERD  ,  Endo/Other  diabetesHyper parathyroidism  Renal/GU      Musculoskeletal  (+) Arthritis ,   Abdominal   Peds  Hematology   Anesthesia Other Findings   Reproductive/Obstetrics                            Anesthesia Physical Anesthesia Plan  ASA: III  Anesthesia Plan: General   Post-op Pain Management:    Induction: Intravenous  PONV Risk Score and Plan: 3 and Treatment may vary due to age or medical condition, Ondansetron and Dexamethasone  Airway Management Planned: Oral ETT  Additional Equipment:   Intra-op Plan:   Post-operative Plan: Extubation in OR  Informed Consent: I have reviewed the patients History and Physical, chart, labs and discussed the procedure including the risks, benefits and alternatives for the proposed anesthesia with the patient or authorized representative who has indicated his/her understanding and acceptance.   Dental advisory given  Plan Discussed with:   Anesthesia Plan Comments:         Anesthesia Quick Evaluation

## 2017-01-01 NOTE — Progress Notes (Signed)
Benign final path.  Micheal [name] - please notify patient.  Will give copy of path report at follow up visit.  Armandina Gemma, [place] [facility] Office: [phone]

## 2017-01-09 ENCOUNTER — Ambulatory Visit: Payer: Medicare Other | Admitting: Endocrinology

## 2017-01-09 DIAGNOSIS — E210 Primary hyperparathyroidism: Secondary | ICD-10-CM | POA: Diagnosis not present

## 2017-01-14 ENCOUNTER — Other Ambulatory Visit: Payer: Medicare Other

## 2017-01-17 ENCOUNTER — Ambulatory Visit: Payer: Medicare Other | Admitting: Internal Medicine

## 2017-01-21 ENCOUNTER — Other Ambulatory Visit: Payer: Medicare Other

## 2017-01-21 DIAGNOSIS — E1151 Type 2 diabetes mellitus with diabetic peripheral angiopathy without gangrene: Secondary | ICD-10-CM

## 2017-01-21 DIAGNOSIS — E210 Primary hyperparathyroidism: Secondary | ICD-10-CM | POA: Diagnosis not present

## 2017-01-23 ENCOUNTER — Telehealth: Payer: Self-pay | Admitting: Internal Medicine

## 2017-01-23 LAB — COMPLETE METABOLIC PANEL WITH GFR
AG Ratio: 2.3 (calc) (ref 1.0–2.5)
ALT: 12 U/L (ref 9–46)
AST: 14 U/L (ref 10–35)
Albumin: 4.2 g/dL (ref 3.6–5.1)
Alkaline phosphatase (APISO): 80 U/L (ref 40–115)
BUN: 12 mg/dL (ref 7–25)
CO2: 26 mmol/L (ref 20–32)
Calcium: 9.2 mg/dL (ref 8.6–10.3)
Chloride: 106 mmol/L (ref 98–110)
Creat: 0.92 mg/dL (ref 0.70–1.11)
GFR, Est African American: 87 mL/min/{1.73_m2} (ref >=60)
GFR, Est Non African American: 75 mL/min/{1.73_m2} (ref >=60)
Globulin: 1.8 g/dL (calc) — ABNORMAL LOW (ref 1.9–3.7)
Glucose, Bld: 156 mg/dL — ABNORMAL HIGH (ref 65–99)
Potassium: 4.1 mmol/L (ref 3.5–5.3)
Sodium: 140 mmol/L (ref 135–146)
Total Bilirubin: 0.6 mg/dL (ref 0.2–1.2)
Total Protein: 6 g/dL — ABNORMAL LOW (ref 6.1–8.1)

## 2017-01-23 LAB — HEMOGLOBIN A1C
Hgb A1c MFr Bld: 9.2 % of total Hgb — ABNORMAL HIGH (ref <5.7)
Mean Plasma Glucose: 217 (calc)
eAG (mmol/L): 12 (calc)

## 2017-01-23 LAB — PTH, INTACT AND CALCIUM
Calcium: 9.2 mg/dL (ref 8.6–10.3)
PTH: 60 pg/mL (ref 14–64)

## 2017-01-23 NOTE — Telephone Encounter (Signed)
I left a message asking the patient to call and schedule AWV-S that's due on or after 02/01/17. VDM (DD)

## 2017-01-24 ENCOUNTER — Ambulatory Visit (INDEPENDENT_AMBULATORY_CARE_PROVIDER_SITE_OTHER): Payer: Medicare Other | Admitting: Internal Medicine

## 2017-01-24 ENCOUNTER — Encounter: Payer: Self-pay | Admitting: Internal Medicine

## 2017-01-24 VITALS — BP 122/78 | HR 76 | Temp 97.8°F | Wt 166.0 lb

## 2017-01-24 DIAGNOSIS — M159 Polyosteoarthritis, unspecified: Secondary | ICD-10-CM

## 2017-01-24 DIAGNOSIS — E1165 Type 2 diabetes mellitus with hyperglycemia: Secondary | ICD-10-CM | POA: Diagnosis not present

## 2017-01-24 DIAGNOSIS — E1151 Type 2 diabetes mellitus with diabetic peripheral angiopathy without gangrene: Secondary | ICD-10-CM

## 2017-01-24 DIAGNOSIS — I10 Essential (primary) hypertension: Secondary | ICD-10-CM | POA: Diagnosis not present

## 2017-01-24 DIAGNOSIS — E210 Primary hyperparathyroidism: Secondary | ICD-10-CM | POA: Diagnosis not present

## 2017-01-24 DIAGNOSIS — E1140 Type 2 diabetes mellitus with diabetic neuropathy, unspecified: Secondary | ICD-10-CM

## 2017-01-24 DIAGNOSIS — H353232 Exudative age-related macular degeneration, bilateral, with inactive choroidal neovascularization: Secondary | ICD-10-CM

## 2017-01-24 DIAGNOSIS — IMO0002 Reserved for concepts with insufficient information to code with codable children: Secondary | ICD-10-CM

## 2017-01-24 NOTE — Progress Notes (Signed)
Location:  [facility] clinic Provider:  Timothee Gali L. Mariea Clonts, D.O., C.M.D.  Code Status: DNR Goals of Care:  Advanced Directives 01/24/2017  Does Patient Have a Medical Advance Directive? Yes  Type of Advance Directive Living will  Does patient want to make changes to medical advance directive? No - Patient declined  Copy of [place] in Chart? -  Would patient like information on creating a medical advance directive? -   Chief Complaint  Patient presents with  . Medical Management of Chronic Issues    59mth follow-up    HPI: Patient is a 83 y.o. male seen today for medical management of chronic diseases.    Around November first he had an injection in his knee joint which helped, but is wearing off now.  He had his CT of his knee with Dr. Veverly Fells and he said he needs a replacement.  He's afraid about having surgery on his knee at his age going on 89.    His PTH and calcium have normalized.  He had parathyroid adenoma removed.  He is moving a lot better except the knee.  If he does any active things, his legs get sore.    Left leg itches real bad off and on and on his left palm and over his lower back on the right side.  The itching began after he helped with cleaning up from the shrubbery being trimmed.  I suspect this is a neuropathy from his diabetes.  There has never been a rash.  He used the tylenol pm for it and finally after a week the part on his back got better.    His sugar has trended up--he had the cortisone shot in early nov which kicked it up for 2 wks.  Then Christmas and New year's.  He knew it would be up some.  It was in the high 200s after the shot.  Once was 300.  On lantus 40  Units at bedtime and then humulin 70/30 20 units in the morning.  He takes his sugar twice a day before his insulin.  AM was 179.  PM ones have been upper 100s to lower 200s.  He's hitting the donut hold in August and then insulin is a few hundred dollars.  He's been eating more sweets and  starches.  More of the taters and crackers and fried fish.  Only likes fried seafood, not other kinds.    Has had severe macular, wet and has had two retinal detachments and it's a miracle he can see.  He's taken all 4 types of injections.  He is no longer on anything and has been stable for 8 mos at a time.  He's to go right away to [facility] if he has any bleeding so he can get a laser tx.  He cannot read.  He sees colors and he can see me from the side.  He does not drive.  His wife drives.  He reports he can read the pen and he does the 20 clicks and he knows the turns for the lantus with a magnifying glass.    Past Medical History:  Diagnosis Date  . Allergic rhinitis, cause unspecified   . Carpal tunnel syndrome   . Cervical spondylosis without myelopathy   . Cervical spondylosis without myelopathy   . Cervicalgia   . Diverticulosis of colon (without mention of hemorrhage)   . Esophageal reflux   . Hypertrophy of prostate with urinary obstruction and other lower urinary  tract symptoms (LUTS)   . Insomnia, unspecified   . Internal hemorrhoids without mention of complication   . Irritable bowel syndrome   . Macular degeneration (senile) of retina, unspecified   . Malignant neoplasm of prostate ([place])   . Neurogenic bladder, NOS   . Nonspecific (abnormal) findings on radiological and other examination of gastrointestinal tract   . Orthostatic hypotension   . Osteoarthrosis, unspecified whether generalized or localized, unspecified site   . Other and unspecified hyperlipidemia   . Other malaise and fatigue   . Other specified disorder of rectum and anus    Radiation Proctitus  . Paroxysmal supraventricular tachycardia ([place])   . Restless legs syndrome (RLS)   . Retinal detachment with retinal defect, unspecified   . Right bundle branch block    Incomplete  . Spermatocele    Right  . Trigger finger (acquired)   . Type II or unspecified type diabetes mellitus without mention of  complication, not stated as uncontrolled   . Type II or unspecified type diabetes mellitus without mention of complication, uncontrolled   . Unspecified essential hypertension   . Vitamin D deficiency     Past Surgical History:  Procedure Laterality Date  . Angiolipoma  1980   Right arm  . APPENDECTOMY  1951  . CHOLECYSTECTOMY  01/1994  . COLONOSCOPY  04/11/2010  . COLONOSCOPY W/ POLYPECTOMY  2005   Removed 2 (two) polyps  . DUPUYTREN CONTRACTURE RELEASE Bilateral 1989  . EYE SURGERY  2006   Left  . EYE SURGERY  2006   Retina reattachment, right  . EYE SURGERY  02/1994   Cataract surgery, right  . EYE SURGERY  01/1995   Cataract surgery, left  . KNEE SURGERY  1979  . PARATHYROIDECTOMY Left 12/27/2016   Procedure: LEFT INFERIOR PARATHYROIDECTOMY;  Surgeon: Armandina Gemma, MD;  Location: [facility];  Service: General;  Laterality: Left;  . POLYPECTOMY  1955   Vocal cord  . RETINAL DETACHMENT SURGERY Right 2005  . SHOULDER ARTHROSCOPY Right 03/22/15   [facility]  . TENDON RELEASE  01/1997   Fourth finger, left  . TRANSURETHRAL RESECTION OF PROSTATE  08/1989  . VITRECTOMY Left 04/23/2013   [facility]    Allergies  Allergen Reactions  . Aspirin Other (See Comments)    Bothers stomach, thins blood   . Atorvastatin Other (See Comments)    Muscular pain and weakness  . Prednisone Other (See Comments)    Upset stomach   . Simvastatin Other (See Comments)    Muscular pain and weakness  . Sulfa Antibiotics Other (See Comments)    Upset stomach   . Latex Rash  . Metformin Hcl Nausea Only  . Voltaren [Diclofenac Sodium] Other (See Comments)    Outpatient Encounter Medications as of 01/24/2017  Medication Sig  . acetaminophen (TYLENOL) 500 MG tablet Take 1,000 mg by mouth every 8 (eight) hours as needed for moderate pain. Take one tablet as needed for pain.   Marland Kitchen alum & mag hydroxide-simeth (MAALOX/MYLANTA) 200-200-20 MG/5ML suspension Take 30 mLs by mouth as needed for indigestion or  heartburn.  . cholecalciferol (VITAMIN D) 400 units TABS tablet Take 400 Units by mouth daily.  Marland Kitchen docusate sodium (COLACE) 100 MG capsule Take 100 mg by mouth daily.  . famotidine (PEPCID AC) 10 MG chewable tablet Chew 10 mg by mouth daily as needed for heartburn.  . Insulin Isophane & Regular Human (HUMULIN 70/30 KWIKPEN) (70-30) 100 UNIT/ML PEN Inject 20 units each mornig  .  Insulin Pen Needle (B-D ULTRAFINE III SHORT PEN) 31G X 8 MM MISC Use twice daily for insulin  . LANTUS SOLOSTAR 100 UNIT/ML Solostar Pen INJECT SUBCUTANEOUSLY 40  UNITS AT BEDTIME FOR  DIABETES  . loratadine (CLARITIN) 10 MG tablet Take 10 mg by mouth daily.  . metoprolol tartrate (LOPRESSOR) 50 MG tablet TAKE 1 TABLET BY MOUTH TWO  TIMES DAILY  . Multiple Vitamin (MULTIVITAMIN WITH MINERALS) TABS tablet Take 1 tablet by mouth daily.  . Probiotic Product (PROBIOTIC-10) CHEW Chew 2 each by mouth daily.   . tamsulosin (FLOMAX) 0.4 MG CAPS capsule TAKE 1 CAPSULE BY MOUTH  DAILY FOR PROSTATE  . traMADol (ULTRAM) 50 MG tablet Take 1-2 tablets (50-100 mg total) by mouth every 6 (six) hours as needed for moderate pain.  Marland Kitchen trolamine salicylate (ASPERCREME) 10 % cream Apply 1 application topically as needed for muscle pain.  . [DISCONTINUED] diclofenac sodium (VOLTAREN) 1 % GEL Apply 2 g topically 4 (four) times daily. (Patient not taking: Reported on 12/18/2016)   No facility-administered encounter medications on file as of 01/24/2017.     Review of Systems:  Review of Systems  Constitutional: Negative for chills, fever and malaise/fatigue.  HENT: Negative for congestion.   Eyes: Positive for blurred vision.       Advanced macular degeneration  Respiratory: Negative for cough and shortness of breath.   Cardiovascular: Negative for chest pain and palpitations.  Gastrointestinal: Negative for abdominal pain, blood in stool, constipation and melena.  Genitourinary: Negative for dysuria.  Musculoskeletal: Positive for joint  pain.       Right knee, some generalized, but strength and mobility much better after parathyroidectomy  Skin: Negative for itching and rash.  Neurological: Positive for sensory change. Negative for dizziness and weakness.  Endo/Heme/Allergies:       Hyperglycemia from DM worse, hyperpara s/p parathyroidectomy  Psychiatric/Behavioral: Negative for depression.    Health Maintenance  Topic Date Due  . FOOT EXAM  08/29/2016  . OPHTHALMOLOGY EXAM  08/30/2016  . URINE MICROALBUMIN  06/05/2017  . HEMOGLOBIN A1C  07/21/2017  . TETANUS/TDAP  08/22/2020  . INFLUENZA VACCINE  Completed  . PNA vac Low Risk Adult  Completed    Physical Exam: Vitals:   01/24/17 1430  BP: 122/78  Pulse: 76  Temp: 97.8 F (36.6 C)  TempSrc: Oral  SpO2: 98%  Weight: 166 lb (75.3 kg)   Body mass index is 26.79 kg/m. Physical Exam  Constitutional: He is oriented to person, place, and time. No distress.  HENT:  Head: Normocephalic and atraumatic.  Eyes:  Poor vision  Cardiovascular: Normal rate, regular rhythm, normal heart sounds and intact distal pulses.  Pulmonary/Chest: Effort normal and breath sounds normal.  Abdominal: Soft. Bowel sounds are normal. He exhibits no distension. There is no tenderness.  Musculoskeletal: He exhibits tenderness.  Of right knee  Neurological: He is alert and oriented to person, place, and time.  Skin: Skin is warm and dry. Capillary refill takes less than 2 seconds.  Psychiatric: He has a normal mood and affect.    Labs reviewed: Basic Metabolic Panel: Recent Labs    08/01/16 1410  09/04/16 0843 12/25/16 1035 01/21/17 0846  NA 138   < > 142 138 140  K 4.8   < > 4.8 4.7 4.1  CL 106   < > 109 108 106  CO2 22   < > 22 25 26   GLUCOSE 136*   < > 143* 158* 156*  BUN 13   < >  10 17 12   CREATININE 0.94   < > 0.92 0.95 0.92  CALCIUM 11.1*   < > 10.7* 10.5* 9.2  9.2  TSH 1.30  --   --   --   --    < > = values in this interval not displayed.   Liver Function  Tests: Recent Labs    06/05/16 0839 08/01/16 1410 09/04/16 0843 01/21/17 0846  AST 16 17 17 14   ALT 11 14 16 12   ALKPHOS 87 93 78  --   BILITOT 0.5 0.5 0.4 0.6  PROT 6.3 6.4 5.9* 6.0*  ALBUMIN 4.2 4.4 4.2  --    No results for input(s): LIPASE, AMYLASE in the last 8760 hours. No results for input(s): AMMONIA in the last 8760 hours. CBC: Recent Labs    08/01/16 1410 12/25/16 1035  WBC 5.6 5.1  NEUTROABS 3,360  --   HGB 13.3 12.9*  HCT 39.2 38.6*  MCV 88.3 90.0  PLT 229 236   Lipid Panel: Recent Labs    09/04/16 0843  CHOL 254*  HDL 35*  LDLCALC 162*  TRIG 285*  CHOLHDL 7.3*   Lab Results  Component Value Date   HGBA1C 9.2 (H) 01/21/2017    Assessment/Plan 1. Primary hyperparathyroidism (HCC) -s/p parathyroidectomy--was benign parathyroid adenoma -calcium and PTH now normal -is clinically improved with mobility, gait speed, flexibility, but still has terrible knee pain and some other arthralgias due to OA - CBC with Differential/Platelet; Future - COMPLETE METABOLIC PANEL WITH GFR; Future  2. DM (diabetes mellitus), type 2 with peripheral vascular complications (HCC) -control has deteriorated--counseled on dietary changes and will increase lantus/basaglar to 45 units at hs and continue his am humulin 70/30, cost is a major issue for him and he goes into the donut hole early in the year - CBC with Differential/Platelet; Future - COMPLETE METABOLIC PANEL WITH GFR; Future - Hemoglobin A1c; Future - Lipid panel; Future  3. Generalized osteoarthritis of multiple sites -ongoing, right knee biggest issue and may need replacement, cont tramadol when sever pain, aspercreme topically, tylenol for mild to moderate pain  4. Exudative age-related macular degeneration of both eyes with inactive choroidal neovascularization ([place]) -cont to follow with ophtho for this, does not drive  5. DM type 2, uncontrolled, with neuropathy ([place]) -seems to be cause of "itching" on  shin he's noting since control has worsened--challenge is cost for him so it makes the new drugs prohibitive, plus he may not be making insulin anymore at this stage -can cut down on fried food though - Hemoglobin A1c; Future  6. Essential hypertension -bp at goal with current regimen, no changes - COMPLETE METABOLIC PANEL WITH GFR; Future  Labs/tests ordered:   Orders Placed This Encounter  Procedures  . CBC with Differential/Platelet    Standing Status:   Future    Standing Expiration Date:   09/24/2017  . COMPLETE METABOLIC PANEL WITH GFR    Standing Status:   Future    Standing Expiration Date:   09/24/2017  . Hemoglobin A1c    Standing Status:   Future    Standing Expiration Date:   09/24/2017  . Lipid panel    Standing Status:   Future    Standing Expiration Date:   09/24/2017   Next appt:  03/01/2017   Nitya Cauthon L. Aarian Cleaver, D.O. [facility] [address], [place] [address] Cell Phone (Mon-Fri 8am-5pm):  [phone] On Call:  [phone] & follow prompts after  5pm & weekends Office Phone:  [phone] Office Fax:  [phone]

## 2017-01-24 NOTE — Patient Instructions (Signed)
Cut down on the fried fish, starchy carbs like taters to help lower your sugar.

## 2017-01-30 MED ORDER — INSULIN GLARGINE 100 UNIT/ML SOLOSTAR PEN: 45.0000 [IU] | PEN_INJECTOR | Freq: Every day | SUBCUTANEOUS | 6 refills | Status: DC

## 2017-02-15 ENCOUNTER — Other Ambulatory Visit: Payer: Self-pay | Admitting: *Deleted

## 2017-02-15 DIAGNOSIS — I471 Supraventricular tachycardia: Secondary | ICD-10-CM

## 2017-02-15 MED ORDER — METOPROLOL TARTRATE 50 MG PO TABS: 50.0000 mg | ORAL_TABLET | Freq: Two times a day (BID) | ORAL | 1 refills | Status: DC

## 2017-02-15 NOTE — Telephone Encounter (Signed)
[facility] 

## 2017-02-19 DIAGNOSIS — M25561 Pain in right knee: Secondary | ICD-10-CM | POA: Diagnosis not present

## 2017-02-25 DIAGNOSIS — H353233 Exudative age-related macular degeneration, bilateral, with inactive scar: Secondary | ICD-10-CM | POA: Diagnosis not present

## 2017-03-01 ENCOUNTER — Ambulatory Visit: Payer: Medicare Other

## 2017-03-04 ENCOUNTER — Ambulatory Visit (INDEPENDENT_AMBULATORY_CARE_PROVIDER_SITE_OTHER): Payer: Medicare Other

## 2017-03-04 VITALS — BP 118/62 | HR 86 | Temp 97.6°F | Ht 66.0 in | Wt 161.0 lb

## 2017-03-04 DIAGNOSIS — Z0000 Encounter for general adult medical examination without abnormal findings: Secondary | ICD-10-CM | POA: Diagnosis not present

## 2017-03-04 NOTE — Progress Notes (Signed)
Subjective:   Micheal [name] is a 84 y.o. male who presents for Medicare Annual/Subsequent preventive examination.  Last AWV 02/01/2016       Objective:    Vitals: BP 118/62 (BP Location: Right Arm, Patient Position: Sitting)   Pulse 86   Temp 97.6 F (36.4 C) (Oral)   Ht 5\' 6"  (1.676 m)   Wt 161 lb (73 kg)   SpO2 97%   BMI 25.99 kg/m   Body mass index is 25.99 kg/m.  Advanced Directives 03/04/2017 01/24/2017 12/25/2016 09/06/2016 08/17/2016 08/01/2016 06/07/2016  Does Patient Have a Medical Advance Directive? Yes Yes Yes Yes Yes Yes Yes  Type of Advance Directive - Living will Living will [place];Living will [place];Living will [place];Living will [place];Living will  Does patient want to make changes to medical advance directive? No - Patient declined No - Patient declined No - Patient declined - - - -  Copy of [place] in Chart? - - - No - copy requested No - copy requested No - copy requested No - copy requested  Would patient like information on creating a medical advance directive? - - - - - - -    Tobacco Social History   Tobacco Use  Smoking Status Never Smoker  Smokeless Tobacco Never Used     Counseling given: Not Answered   Clinical Intake:  Pre-visit preparation completed: No  Pain : No/denies pain     Nutritional Risks: None Diabetes: Yes CBG done?: No Did pt. bring in CBG monitor from home?: No  How often do you need to have someone help you when you read instructions, pamphlets, or other written materials from your doctor or pharmacy?: 3 - Sometimes What is the last grade level you completed in school?: High School  Interpreter Needed?: No  Information entered by :: [name], RN  Past Medical History:  Diagnosis Date  . Allergic rhinitis, cause unspecified   . Carpal tunnel syndrome   . Cervical spondylosis without myelopathy   .  Cervical spondylosis without myelopathy   . Cervicalgia   . Diverticulosis of colon (without mention of hemorrhage)   . Esophageal reflux   . Hypertrophy of prostate with urinary obstruction and other lower urinary tract symptoms (LUTS)   . Insomnia, unspecified   . Internal hemorrhoids without mention of complication   . Irritable bowel syndrome   . Macular degeneration (senile) of retina, unspecified   . Malignant neoplasm of prostate ([place])   . Neurogenic bladder, NOS   . Nonspecific (abnormal) findings on radiological and other examination of gastrointestinal tract   . Orthostatic hypotension   . Osteoarthrosis, unspecified whether generalized or localized, unspecified site   . Other and unspecified hyperlipidemia   . Other malaise and fatigue   . Other specified disorder of rectum and anus    Radiation Proctitus  . Paroxysmal supraventricular tachycardia ([place])   . Restless legs syndrome (RLS)   . Retinal detachment with retinal defect, unspecified   . Right bundle branch block    Incomplete  . Spermatocele    Right  . Trigger finger (acquired)   . Type II or unspecified type diabetes mellitus without mention of complication, not stated as uncontrolled   . Type II or unspecified type diabetes mellitus without mention of complication, uncontrolled   . Unspecified essential hypertension   . Vitamin D deficiency    Past Surgical History:  Procedure Laterality Date  .  Angiolipoma  1980   Right arm  . APPENDECTOMY  1951  . CHOLECYSTECTOMY  01/1994  . COLONOSCOPY  04/11/2010  . COLONOSCOPY W/ POLYPECTOMY  2005   Removed 2 (two) polyps  . DUPUYTREN CONTRACTURE RELEASE Bilateral 1989  . EYE SURGERY  2006   Left  . EYE SURGERY  2006   Retina reattachment, right  . EYE SURGERY  02/1994   Cataract surgery, right  . EYE SURGERY  01/1995   Cataract surgery, left  . KNEE SURGERY  1979  . PARATHYROIDECTOMY Left 12/27/2016   Procedure: LEFT INFERIOR PARATHYROIDECTOMY;  Surgeon:  Armandina Gemma, MD;  Location: [facility];  Service: General;  Laterality: Left;  . POLYPECTOMY  1955   Vocal cord  . RETINAL DETACHMENT SURGERY Right 2005  . SHOULDER ARTHROSCOPY Right 03/22/15   [place]  . TENDON RELEASE  01/1997   Fourth finger, left  . TRANSURETHRAL RESECTION OF PROSTATE  08/1989  . VITRECTOMY Left 04/23/2013   [facility]   Family History  Problem Relation Age of Onset  . Depression Mother   . Diabetes Mother   . Mental illness Mother        OCD  . Hypertension Sister   . Diabetes Daughter   . Hyperlipidemia Daughter   . Hyperparathyroidism Neg Hx    Social History   Socioeconomic History  . Marital status: Married    Spouse name: None  . Number of children: None  . Years of education: None  . Highest education level: None  Social Needs  . Financial resource strain: Not hard at all  . Food insecurity - worry: Never true  . Food insecurity - inability: Never true  . Transportation needs - medical: No  . Transportation needs - non-medical: No  Occupational History  . None  Tobacco Use  . Smoking status: Never Smoker  . Smokeless tobacco: Never Used  Substance and Sexual Activity  . Alcohol use: No    Alcohol/week: 0.0 oz  . Drug use: No  . Sexual activity: No  Other Topics Concern  . None  Social History Narrative   Married   Never smoked   Alcohol none   Exercise walks dog twice a day   Living will    Outpatient Encounter Medications as of 03/04/2017  Medication Sig  . acetaminophen (TYLENOL) 500 MG tablet Take 1,000 mg by mouth every 8 (eight) hours as needed for moderate pain. Take one tablet as needed for pain.   Marland Kitchen alum & mag hydroxide-simeth (MAALOX/MYLANTA) 200-200-20 MG/5ML suspension Take 30 mLs by mouth as needed for indigestion or heartburn.  . cholecalciferol (VITAMIN D) 400 units TABS tablet Take 400 Units by mouth daily.  Marland Kitchen docusate sodium (COLACE) 100 MG capsule Take 100 mg by mouth daily.  . famotidine (PEPCID AC) 10 MG chewable  tablet Chew 10 mg by mouth daily as needed for heartburn.  . Insulin Glargine (LANTUS SOLOSTAR) 100 UNIT/ML Solostar Pen Inject 45 Units into the skin daily at 10 pm.  . Insulin Isophane & Regular Human (HUMULIN 70/30 KWIKPEN) (70-30) 100 UNIT/ML PEN Inject 20 units each mornig  . Insulin Pen Needle (B-D ULTRAFINE III SHORT PEN) 31G X 8 MM MISC Use twice daily for insulin  . loratadine (CLARITIN) 10 MG tablet Take 10 mg by mouth daily.  . metoprolol tartrate (LOPRESSOR) 50 MG tablet Take 1 tablet (50 mg total) by mouth 2 (two) times daily.  . Probiotic Product (PROBIOTIC-10) CHEW Chew 2 each by mouth daily.   Marland Kitchen  tamsulosin (FLOMAX) 0.4 MG CAPS capsule TAKE 1 CAPSULE BY MOUTH  DAILY FOR PROSTATE  . trolamine salicylate (ASPERCREME) 10 % cream Apply 1 application topically as needed for muscle pain.  . [DISCONTINUED] Multiple Vitamin (MULTIVITAMIN WITH MINERALS) TABS tablet Take 1 tablet by mouth daily.  . [DISCONTINUED] traMADol (ULTRAM) 50 MG tablet Take 1-2 tablets (50-100 mg total) by mouth every 6 (six) hours as needed for moderate pain. (Patient taking differently: Take 50-100 mg by mouth every 6 (six) hours as needed for moderate pain. )   No facility-administered encounter medications on file as of 03/04/2017.     Activities of Daily Living In your present state of health, do you have any difficulty performing the following activities: 03/04/2017 12/25/2016  Hearing? Tempie Donning  Vision? Y Y  Comment macular degeneration -  Difficulty concentrating or making decisions? Y N  Walking or climbing stairs? Y N  Dressing or bathing? N N  Doing errands, shopping? Y N  Preparing Food and eating ? N -  Using the Toilet? N -  In the past six months, have you accidently leaked urine? N -  Do you have problems with loss of bowel control? N -  Managing your Medications? Y -  Managing your Finances? Y -  Housekeeping or managing your Housekeeping? Y -  Some recent data might be hidden    Patient Care  Team: Gayland Curry, DO as PCP - General (Geriatric Medicine) Netta Cedars, MD as Consulting Physician (Orthopedic Surgery) Jasmine Awe, MD as Referring Physician (Ophthalmology) Rana Snare, MD as Consulting Physician (Urology)   Assessment:   This is a routine wellness examination for [place].  Exercise Activities and Dietary recommendations Current Exercise Habits: Home exercise routine, Type of exercise: walking, Time (Minutes): 30, Frequency (Times/Week): 3, Weekly Exercise (Minutes/Week): 90, Exercise limited by: None identified  Goals    None      Fall Risk Fall Risk  03/04/2017 01/24/2017 09/06/2016 08/01/2016 06/07/2016  Falls in the past year? No No No No No  Risk for fall due to : - - - - -   Is the patient's home free of loose throw rugs in walkways, pet beds, electrical cords, etc?   yes      Grab bars in the bathroom? yes      Handrails on the stairs?   yes      Adequate lighting?   yes  Timed Get Up and Go Performed: 23 seconds, fall risk  Depression Screen PHQ 2/9 Scores 03/04/2017 01/24/2017 09/06/2016 06/07/2016  PHQ - 2 Score 0 0 0 0    Cognitive Function MMSE - Mini Mental State Exam 03/04/2017 02/01/2016  Not completed: (No Data) Unable to complete  Orientation to time 4 5  Orientation to Place 4 5  Registration 3 3  Attention/ Calculation 4 3  Recall 0 0  Language- name 2 objects 2 2  Language- repeat 1 1  Language- follow 3 step command 3 0  Language- read & follow direction 1 0  Write a sentence 1 0  Copy design 0 0  Total score 23 19        Immunization History  Administered Date(s) Administered  . Influenza, High Dose Seasonal PF 10/19/2016  . Influenza,inj,Quad PF,6+ Mos 10/29/2012, 10/28/2013  . Influenza-Unspecified 10/25/2010, 11/07/2011, 10/07/2015  . Pneumococcal Conjugate-13 05/18/2014  . Pneumococcal Polysaccharide-23 01/23/2007  . Td 08/23/2010    Qualifies for Shingles Vaccine? Yes, educated and wants to  wait  Screening Tests  Health Maintenance  Topic Date Due  . FOOT EXAM  08/29/2016  . OPHTHALMOLOGY EXAM  08/30/2016  . URINE MICROALBUMIN  06/05/2017  . HEMOGLOBIN A1C  07/21/2017  . TETANUS/TDAP  08/22/2020  . INFLUENZA VACCINE  Completed  . PNA vac Low Risk Adult  Completed   Cancer Screenings: Lung: Low Dose CT Chest recommended if Age 38-80 years, 30 pack-year currently smoking OR have quit w/in 15years. Patient does not qualify. Colorectal: up to date  Additional Screenings:  Hepatitis B/HIV/Syphillis: not indicated Hepatitis C Screening: declined    Plan:    I have personally reviewed and addressed the Medicare Annual Wellness questionnaire and have noted the following in the patient's chart:  A. Medical and social history B. Use of alcohol, tobacco or illicit drugs  C. Current medications and supplements D. Functional ability and status E.  Nutritional status F.  Physical activity G. Advance directives H. List of other physicians I.  Hospitalizations, surgeries, and ER visits in previous 12 months J.  [place] to include hearing, vision, cognitive, depression L. Referrals and appointments - none  In addition, I have reviewed and discussed with patient certain preventive protocols, quality metrics, and best practice recommendations. A written personalized care plan for preventive services as well as general preventive health recommendations were provided to patient.  See attached scanned questionnaire for additional information.   Signed,   [name], RN Nurse Health Advisor   Quick Notes   Health Maintenance: Foot exam due. Shingrix due-pt will check on price. Pt saw eye doctor last week.     Abnormal Screen: MMSE 23/30, did not pass clock drawing. Unable to complete due to macular degeneration.     Patient Concerns: None     Nurse Concerns: None

## 2017-03-04 NOTE — Patient Instructions (Signed)
Micheal [name] , Thank you for taking time to come for your Medicare Wellness Visit. I appreciate your ongoing commitment to your health goals. Please review the following plan we discussed and let me know if I can assist you in the future.   Screening recommendations/referrals: Colonoscopy excluded, you are over age 82 Recommended yearly ophthalmology/optometry visit for glaucoma screening and checkup Recommended yearly dental visit for hygiene and checkup  Vaccinations: Influenza vaccine up to date, due 2019 fall seaon Pneumococcal vaccine up to date Tdap vaccine up to date, Due 01/23/2019 Shingles vaccine due    Advanced directives: In Chart  Conditions/risks identified: none  Next appointment: Micheal Dense, RN 03/07/2018 @ 10am  Preventive Care 62 Years and Older, Male Preventive care refers to lifestyle choices and visits with your health care provider that can promote health and wellness. What does preventive care include?  A yearly physical exam. This is also called an annual well check.  Dental exams once or twice a year.  Routine eye exams. Ask your health care provider how often you should have your eyes checked.  Personal lifestyle choices, including:  Daily care of your teeth and gums.  Regular physical activity.  Eating a healthy diet.  Avoiding tobacco and drug use.  Limiting alcohol use.  Practicing safe sex.  Taking low doses of aspirin every day.  Taking vitamin and mineral supplements as recommended by your health care provider. What happens during an annual well check? The services and screenings done by your health care provider during your annual well check will depend on your age, overall health, lifestyle risk factors, and family history of disease. Counseling  Your health care provider may ask you questions about your:  Alcohol use.  Tobacco use.  Drug use.  Emotional well-being.  Home and relationship well-being.  Sexual  activity.  Eating habits.  History of falls.  Memory and ability to understand (cognition).  Work and work Statistician. Screening  You may have the following tests or measurements:  Height, weight, and BMI.  Blood pressure.  Lipid and cholesterol levels. These may be checked every 5 years, or more frequently if you are over 37 years old.  Skin check.  Lung cancer screening. You may have this screening every year starting at age 3 if you have a 30-pack-year history of smoking and currently smoke or have quit within the past 15 years.  Fecal occult blood test (FOBT) of the stool. You may have this test every year starting at age 79.  Flexible sigmoidoscopy or colonoscopy. You may have a sigmoidoscopy every 5 years or a colonoscopy every 10 years starting at age 73.  Prostate cancer screening. Recommendations will vary depending on your family history and other risks.  Hepatitis C blood test.  Hepatitis B blood test.  Sexually transmitted disease (STD) testing.  Diabetes screening. This is done by checking your blood sugar (glucose) after you have not eaten for a while (fasting). You may have this done every 1-3 years.  Abdominal aortic aneurysm (AAA) screening. You may need this if you are a current or former smoker.  Osteoporosis. You may be screened starting at age 18 if you are at high risk. Talk with your health care provider about your test results, treatment options, and if necessary, the need for more tests. Vaccines  Your health care provider may recommend certain vaccines, such as:  Influenza vaccine. This is recommended every year.  Tetanus, diphtheria, and acellular pertussis (Tdap, Td) vaccine. You may need a  Td booster every 10 years.  Zoster vaccine. You may need this after age 86.  Pneumococcal 13-valent conjugate (PCV13) vaccine. One dose is recommended after age 70.  Pneumococcal polysaccharide (PPSV23) vaccine. One dose is recommended after age  69. Talk to your health care provider about which screenings and vaccines you need and how often you need them. This information is not intended to replace advice given to you by your health care provider. Make sure you discuss any questions you have with your health care provider. Document Released: 02/04/2015 Document Revised: 09/28/2015 Document Reviewed: 11/09/2014 Elsevier Interactive Patient Education  2017 [place] Prevention in the Home Falls can cause injuries. They can happen to people of all ages. There are many things you can do to make your home safe and to help prevent falls. What can I do on the outside of my home?  Regularly fix the edges of walkways and driveways and fix any cracks.  Remove anything that might make you trip as you walk through a door, such as a raised step or threshold.  Trim any bushes or trees on the path to your home.  Use bright outdoor lighting.  Clear any walking paths of anything that might make someone trip, such as rocks or tools.  Regularly check to see if handrails are loose or broken. Make sure that both sides of any steps have handrails.  Any raised decks and porches should have guardrails on the edges.  Have any leaves, snow, or ice cleared regularly.  Use sand or salt on walking paths during winter.  Clean up any spills in your garage right away. This includes oil or grease spills. What can I do in the bathroom?  Use night lights.  Install grab bars by the toilet and in the tub and shower. Do not use towel bars as grab bars.  Use non-skid mats or decals in the tub or shower.  If you need to sit down in the shower, use a plastic, non-slip stool.  Keep the floor dry. Clean up any water that spills on the floor as soon as it happens.  Remove soap buildup in the tub or shower regularly.  Attach bath mats securely with double-sided non-slip rug tape.  Do not have throw rugs and other things on the floor that can make  you trip. What can I do in the bedroom?  Use night lights.  Make sure that you have a light by your bed that is easy to reach.  Do not use any sheets or blankets that are too big for your bed. They should not hang down onto the floor.  Have a firm chair that has side arms. You can use this for support while you get dressed.  Do not have throw rugs and other things on the floor that can make you trip. What can I do in the kitchen?  Clean up any spills right away.  Avoid walking on wet floors.  Keep items that you use a lot in easy-to-reach places.  If you need to reach something above you, use a strong step stool that has a grab bar.  Keep electrical cords out of the way.  Do not use floor polish or wax that makes floors slippery. If you must use wax, use non-skid floor wax.  Do not have throw rugs and other things on the floor that can make you trip. What can I do with my stairs?  Do not leave any items on the stairs.  Make sure that there are handrails on both sides of the stairs and use them. Fix handrails that are broken or loose. Make sure that handrails are as long as the stairways.  Check any carpeting to make sure that it is firmly attached to the stairs. Fix any carpet that is loose or worn.  Avoid having throw rugs at the top or bottom of the stairs. If you do have throw rugs, attach them to the floor with carpet tape.  Make sure that you have a light switch at the top of the stairs and the bottom of the stairs. If you do not have them, ask someone to add them for you. What else can I do to help prevent falls?  Wear shoes that:  Do not have high heels.  Have rubber bottoms.  Are comfortable and fit you well.  Are closed at the toe. Do not wear sandals.  If you use a stepladder:  Make sure that it is fully opened. Do not climb a closed stepladder.  Make sure that both sides of the stepladder are locked into place.  Ask someone to hold it for you, if  possible.  Clearly mark and make sure that you can see:  Any grab bars or handrails.  First and last steps.  Where the edge of each step is.  Use tools that help you move around (mobility aids) if they are needed. These include:  Canes.  Walkers.  Scooters.  Crutches.  Turn on the lights when you go into a dark area. Replace any light bulbs as soon as they burn out.  Set up your furniture so you have a clear path. Avoid moving your furniture around.  If any of your floors are uneven, fix them.  If there are any pets around you, be aware of where they are.  Review your medicines with your doctor. Some medicines can make you feel dizzy. This can increase your chance of falling. Ask your doctor what other things that you can do to help prevent falls. This information is not intended to replace advice given to you by your health care provider. Make sure you discuss any questions you have with your health care provider. Document Released: 11/04/2008 Document Revised: 06/16/2015 Document Reviewed: 02/12/2014 Elsevier Interactive Patient Education  2017 [employer].

## 2017-03-19 ENCOUNTER — Telehealth: Payer: Self-pay | Admitting: *Deleted

## 2017-03-19 NOTE — Telephone Encounter (Signed)
Received fax from [facility] for Right TKA. (Dr. Esmond Plants) Pending Surgery Date, patient is wanting done in April. Scheduled an appointment with Dr. Mariea Clonts for 03/25/17 for Clearance. Placed form in [employer].   [place] [phone] Fax: [phone]

## 2017-03-20 ENCOUNTER — Other Ambulatory Visit: Payer: Self-pay | Admitting: Internal Medicine

## 2017-03-25 ENCOUNTER — Encounter: Payer: Self-pay | Admitting: Internal Medicine

## 2017-03-25 ENCOUNTER — Ambulatory Visit (INDEPENDENT_AMBULATORY_CARE_PROVIDER_SITE_OTHER): Payer: Medicare Other | Admitting: Internal Medicine

## 2017-03-25 VITALS — BP 120/80 | HR 76 | Temp 97.6°F | Wt 159.0 lb

## 2017-03-25 DIAGNOSIS — E210 Primary hyperparathyroidism: Secondary | ICD-10-CM | POA: Diagnosis not present

## 2017-03-25 DIAGNOSIS — M25561 Pain in right knee: Secondary | ICD-10-CM | POA: Diagnosis not present

## 2017-03-25 DIAGNOSIS — I4510 Unspecified right bundle-branch block: Secondary | ICD-10-CM

## 2017-03-25 DIAGNOSIS — G8929 Other chronic pain: Secondary | ICD-10-CM | POA: Diagnosis not present

## 2017-03-25 DIAGNOSIS — Z01818 Encounter for other preprocedural examination: Secondary | ICD-10-CM

## 2017-03-25 DIAGNOSIS — E785 Hyperlipidemia, unspecified: Secondary | ICD-10-CM

## 2017-03-25 DIAGNOSIS — E1151 Type 2 diabetes mellitus with diabetic peripheral angiopathy without gangrene: Secondary | ICD-10-CM

## 2017-03-25 NOTE — Patient Instructions (Signed)
IMPORTANT: The night before your surgery, reduce your lantus to 23 units.  This is because you are not able to eat in the morning due to having surgery.

## 2017-03-25 NOTE — Progress Notes (Signed)
Provider:  Rexene Edison. Mariea Clonts, D.O., C.M.D. Location:   [facility]   Previous PCP: Gayland Curry, DO Patient Care Team: Gayland Curry, DO as PCP - General (Geriatric Medicine) Netta Cedars, MD as Consulting Physician (Orthopedic Surgery) Jasmine Awe, MD as Referring Physician (Ophthalmology) Rana Snare, MD as Consulting Physician (Urology)  Extended Emergency Contact Information Primary Emergency Contact: Arna Snipe [place] Phone: [phone] Work Phone: [phone] Mobile Phone: [phone] Relation: Daughter Secondary Emergency Contact: Schiefelbein,Evelyn Address: [place], [place] Phone: [phone] Relation: Spouse  Code Status: DNR  Goals of Care: Advanced Directive information Advanced Directives 03/25/2017  Does Patient Have a Medical Advance Directive? Yes  Type of Advance Directive Living will  Does patient want to make changes to medical advance directive? No - Patient declined  Copy of [place] in Chart? -  Would patient like information on creating a medical advance directive? -      Chief Complaint  Patient presents with  . Medical Clearance    right knee surgery clearance  . ACP    LIVING WILL    HPI: Patient is a 84 y.o. male seen today in office surgical clearance of total knee replacement, by Dr. Veverly Fells. He is accompanied today by his wife, who is says are "his eyes and ears", as he cant see well or hear well anymore. He has a history of right bundle branch block (incomplete), PSVT, DMII, HTN, and HLD. He also recently underwent parathyroid removal Dec 2018, this went well. Last stress test was over 8 years ago by Dr. Gwenlyn Found. In 2017 he had rotator cuff repaired without adverse effect.   DM II: Today he reports a low blood sugar this morning 69, of which he is not use to and decided to eat breakfast and not fast, blood sugar came  up to 134 post meal. Normal fasting blood sugar ranges 100-130, and in the evening it would be closer to 180-200's.  Recent (12/2016) A1c was 9.2, which is elevated from earlier that month at 8.6. He has been getting cortisone shots in his knee, which causes some of the higher levels.He is on Humulin 70/30 and Lantus and reports not having issues with them.   Today he denies chest pain, racing heart rate, shortness of breath, cough. He denies any unusually bleeding or easy bruising. He denies feeling tired or weak. He denies dizziness or syncope events. He reports he is sleeping well. He is eating without difficulty. He denies trouble with bladder or bowel. He denies recent falls. He denies confusion. He denies skin breakdown.   He does report mild anxiety about the procedure. He is ready to feel better, but still "being put to sleep makes me anxious". He is pleasant in conversations and very friendly.   Past Medical History:  Diagnosis Date  . Allergic rhinitis, cause unspecified   . Carpal tunnel syndrome   . Cervical spondylosis without myelopathy   . Cervical spondylosis without myelopathy   . Cervicalgia   . Diverticulosis of colon (without mention of hemorrhage)   . Esophageal reflux   . Hypertrophy of prostate with urinary obstruction and other lower urinary tract symptoms (LUTS)   . Insomnia, unspecified   . Internal hemorrhoids without mention of complication   . Irritable bowel syndrome   . Macular degeneration (senile) of retina,  unspecified   . Malignant neoplasm of prostate ([place])   . Neurogenic bladder, NOS   . Nonspecific (abnormal) findings on radiological and other examination of gastrointestinal tract   . Orthostatic hypotension   . Osteoarthrosis, unspecified whether generalized or localized, unspecified site   . Other and unspecified hyperlipidemia   . Other malaise and fatigue   . Other specified disorder of rectum and anus    Radiation Proctitus  . Paroxysmal  supraventricular tachycardia ([place])   . Restless legs syndrome (RLS)   . Retinal detachment with retinal defect, unspecified   . Right bundle branch block    Incomplete  . Spermatocele    Right  . Trigger finger (acquired)   . Type II or unspecified type diabetes mellitus without mention of complication, not stated as uncontrolled   . Type II or unspecified type diabetes mellitus without mention of complication, uncontrolled   . Unspecified essential hypertension   . Vitamin D deficiency    Past Surgical History:  Procedure Laterality Date  . Angiolipoma  1980   Right arm  . APPENDECTOMY  1951  . CHOLECYSTECTOMY  01/1994  . COLONOSCOPY  04/11/2010  . COLONOSCOPY W/ POLYPECTOMY  2005   Removed 2 (two) polyps  . DUPUYTREN CONTRACTURE RELEASE Bilateral 1989  . EYE SURGERY  2006   Left  . EYE SURGERY  2006   Retina reattachment, right  . EYE SURGERY  02/1994   Cataract surgery, right  . EYE SURGERY  01/1995   Cataract surgery, left  . KNEE SURGERY  1979  . PARATHYROIDECTOMY Left 12/27/2016   Procedure: LEFT INFERIOR PARATHYROIDECTOMY;  Surgeon: Armandina Gemma, MD;  Location: [facility];  Service: General;  Laterality: Left;  . POLYPECTOMY  1955   Vocal cord  . RETINAL DETACHMENT SURGERY Right 2005  . SHOULDER ARTHROSCOPY Right 03/22/15   [facility]  . TENDON RELEASE  01/1997   Fourth finger, left  . TRANSURETHRAL RESECTION OF PROSTATE  08/1989  . VITRECTOMY Left 04/23/2013   [facility]    reports that  has never smoked. he has never used smokeless tobacco. He reports that he does not drink alcohol or use drugs.  Functional Status Survey:    Family History  Problem Relation Age of Onset  . Depression Mother   . Diabetes Mother   . Mental illness Mother        OCD  . Hypertension Sister   . Diabetes Daughter   . Hyperlipidemia Daughter   . Hyperparathyroidism Neg Hx     Health Maintenance  Topic Date Due  . FOOT EXAM  08/29/2016  . OPHTHALMOLOGY EXAM  08/30/2016  .  URINE MICROALBUMIN  06/05/2017  . HEMOGLOBIN A1C  07/21/2017  . TETANUS/TDAP  08/22/2020  . INFLUENZA VACCINE  Completed  . PNA vac Low Risk Adult  Completed    Allergies  Allergen Reactions  . Aspirin Other (See Comments)    Bothers stomach, thins blood   . Atorvastatin Other (See Comments)    Muscular pain and weakness  . Prednisone Other (See Comments)    Upset stomach   . Simvastatin Other (See Comments)    Muscular pain and weakness  . Sulfa Antibiotics Other (See Comments)    Upset stomach   . Latex Rash  . Metformin Hcl Nausea Only  . Voltaren [Diclofenac Sodium] Other (See Comments)    Outpatient Encounter Medications as of 03/25/2017  Medication Sig  . acetaminophen (TYLENOL) 500 MG tablet Take 1,000 mg by mouth every  8 (eight) hours as needed for moderate pain. Take one tablet as needed for pain.   Marland Kitchen alum & mag hydroxide-simeth (MAALOX/MYLANTA) 200-200-20 MG/5ML suspension Take 30 mLs by mouth as needed for indigestion or heartburn.  . cholecalciferol (VITAMIN D) 400 units TABS tablet Take 400 Units by mouth daily.  Marland Kitchen docusate sodium (COLACE) 100 MG capsule Take 100 mg by mouth daily.  . famotidine (PEPCID AC) 10 MG chewable tablet Chew 10 mg by mouth daily as needed for heartburn.  . Insulin Glargine (LANTUS SOLOSTAR) 100 UNIT/ML Solostar Pen Inject 45 Units into the skin daily at 10 pm.  . Insulin Isophane & Regular Human (HUMULIN 70/30 KWIKPEN) (70-30) 100 UNIT/ML PEN Inject 20 units each mornig  . Insulin Pen Needle (B-D ULTRAFINE III SHORT PEN) 31G X 8 MM MISC Use twice daily for insulin  . loratadine (CLARITIN) 10 MG tablet Take 10 mg by mouth daily.  . metoprolol tartrate (LOPRESSOR) 50 MG tablet Take 1 tablet (50 mg total) by mouth 2 (two) times daily.  . Probiotic Product (PROBIOTIC-10) CHEW Chew 2 each by mouth daily.   . tamsulosin (FLOMAX) 0.4 MG CAPS capsule TAKE 1 CAPSULE BY MOUTH  DAILY FOR PROSTATE  . trolamine salicylate (ASPERCREME) 10 % cream Apply 1  application topically as needed for muscle pain.   No facility-administered encounter medications on file as of 03/25/2017.     Review of Systems  Constitutional: Negative for chills, fever and malaise/fatigue.  HENT: Positive for hearing loss.   Eyes:       Glasses  Respiratory: Negative for cough and shortness of breath.   Cardiovascular: Negative for chest pain, palpitations and leg swelling.  Gastrointestinal: Negative for blood in stool and heartburn.  Genitourinary: Negative for dysuria and hematuria.       BPH  Musculoskeletal: Positive for joint pain.       "bum knee"  Neurological: Negative.   Psychiatric/Behavioral: Negative for depression and memory loss. The patient is nervous/anxious. The patient does not have insomnia.     Vitals:   03/25/17 0853  Weight: 159 lb (72.1 kg)   Body mass index is 25.66 kg/m. Physical Exam  Constitutional: He is oriented to person, place, and time. He appears well-developed and well-nourished.  HENT:  Head: Normocephalic.  Eyes:  Poor vision  Neck: Normal range of motion. Neck supple.  Cardiovascular: Normal rate and regular rhythm.  Sock line edema   Pulmonary/Chest: Effort normal and breath sounds normal.  Abdominal: Soft. Bowel sounds are normal.  Musculoskeletal: Normal range of motion.  Mild effort demonstrated standing up from chair.     Neurological: He is alert and oriented to person, place, and time.  Skin: Skin is warm and dry.  Psychiatric: He has a normal mood and affect. His behavior is normal. Thought content normal.  Pleasant and talkative gentleman  Vitals reviewed.   Labs reviewed: Basic Metabolic Panel: Recent Labs    09/04/16 0843 12/25/16 1035 01/21/17 0846  NA 142 138 140  K 4.8 4.7 4.1  CL 109 108 106  CO2 22 25 26   GLUCOSE 143* 158* 156*  BUN 10 17 12   CREATININE 0.92 0.95 0.92  CALCIUM 10.7* 10.5* 9.2  9.2   Liver Function Tests: Recent Labs    06/05/16 0839 08/01/16 1410  09/04/16 0843 01/21/17 0846  AST 16 17 17 14   ALT 11 14 16 12   ALKPHOS 87 93 78  --   BILITOT 0.5 0.5 0.4 0.6  PROT 6.3 6.4  5.9* 6.0*  ALBUMIN 4.2 4.4 4.2  --    No results for input(s): LIPASE, AMYLASE in the last 8760 hours. No results for input(s): AMMONIA in the last 8760 hours. CBC: Recent Labs    08/01/16 1410 12/25/16 1035  WBC 5.6 5.1  NEUTROABS 3,360  --   HGB 13.3 12.9*  HCT 39.2 38.6*  MCV 88.3 90.0  PLT 229 236   Cardiac Enzymes: No results for input(s): CKTOTAL, CKMB, CKMBINDEX, TROPONINI in the last 8760 hours. BNP: Invalid input(s): POCBNP Lab Results  Component Value Date   HGBA1C 9.2 (H) 01/21/2017   Lab Results  Component Value Date   TSH 1.30 08/01/2016   No results found for: VITAMINB12 No results found for: FOLATE No results found for: IRON, TIBC, FERRITIN  Imaging and Procedures Recently:  Assessment/Plan  1. Pre-operative clearance Today in office he is anxious about being put to sleep at his age. He was reassured with the use of the ECG, RCRI (1.0-1.3%) related to DM II with insulin use. He was also reminded of his recent surgeries of rotator cuff  (2017) as well as parathyroid removal (2018). He did well with both of these. Furthermore we will order labs to assess the parathyroid as well as a complete work up for surgerical clearance.   - EKG 12-Lead - CBC with Differential/Platelet - Lipid panel - PTH, intact and calcium - COMPLETE METABOLIC PANEL WITH GFR  2. DM (diabetes mellitus), type 2 with peripheral vascular complications ([place]) Today in the office he reports a low blood sugar of 69, this is rare for him. He ate breakfast and it quickly returned to 134. He was advised to reduce the amount of lantus by half, and only take 23 units the night before to prevent a hypoglycemic event. This was provided in patient information as well.   - COMPLETE METABOLIC PANEL WITH GFR  3. Chronic pain of right knee He is in th office today for  surgical clearance of a total knee replacement (right). This is related to the extensive history of knee trouble and arthritis that is requiring steroid injections. He reports no issues with the knee today, but the steroid injection is still working he says. Mild trouble with lifting himself out of chair.  4. Primary hyperparathyroidism ([place]) He had a removal in Dec 2018 to help with this. Will assess labs to recheck the surgery. He denies an signs or symptoms and actually reports feeling much better since the surgery.   - PTH, intact and calcium  5. Hyperlipidemia, unspecified hyperlipidemia type His last lipid profile was elevated. Unsure of the diet he is on, as he has been educated on the best foods to help with cholesterol control. Will continue as current, would benefit from medication, but unsure if desired at this time. Will assess with labs.    - Lipid panel  6. Right bundle branch block (RBBB) on electrocardiogram (ECG) Today in office an new ECG was performed which demonstrated a Right bundle branch block, which is stable and was seen on previous ECG. He also has a recent ECHO that demonstrated LV EF: 60% -   65% (07/2016). He denies chest pain or a racing heart rate recently as well as today in the office. His cardiac risk index is (RCRI) is 1.0-1.3% this is related to his DM which requires treatment with insulin. He was made aware of this relatively low risk to help reassure him of this procedure.   Labs/tests ordered: Orders Placed This  Encounter  Procedures  . CBC with Differential/Platelet  . Lipid panel    Order Specific Question:   Has the patient fasted?    Answer:   No  . PTH, intact and calcium  . COMPLETE METABOLIC PANEL WITH GFR  . EKG 12-Lead     Next Visit: 05/30/2017   Karen Kays, RN, DNP student  Geriatrics [facility] [phone], [place] [address] Cell Phone (Mon-Fri 8am-5pm):  [phone] On Call:   [phone] & follow prompts after 5pm & weekends Office Phone:  [phone] Office Fax:  [phone]

## 2017-03-26 LAB — CBC WITH DIFFERENTIAL/PLATELET
Basophils Absolute: 67 cells/uL (ref 0–200)
Basophils Relative: 1.1 %
Eosinophils Absolute: 409 cells/uL (ref 15–500)
Eosinophils Relative: 6.7 %
HCT: 38.6 % (ref 38.5–50.0)
Hemoglobin: 13.6 g/dL (ref 13.2–17.1)
Lymphs Abs: 885 cells/uL (ref 850–3900)
MCH: 30.2 pg (ref 27.0–33.0)
MCHC: 35.2 g/dL (ref 32.0–36.0)
MCV: 85.8 fL (ref 80.0–100.0)
MPV: 12.1 fL (ref 7.5–12.5)
Monocytes Relative: 15.3 %
Neutro Abs: 3806 cells/uL (ref 1500–7800)
Neutrophils Relative %: 62.4 %
Platelets: 253 10*3/uL (ref 140–400)
RBC: 4.5 10*6/uL (ref 4.20–5.80)
RDW: 12 % (ref 11.0–15.0)
Total Lymphocyte: 14.5 %
WBC mixed population: 933 cells/uL (ref 200–950)
WBC: 6.1 10*3/uL (ref 3.8–10.8)

## 2017-03-26 LAB — COMPLETE METABOLIC PANEL WITH GFR
AG Ratio: 2.2 (calc) (ref 1.0–2.5)
ALT: 11 U/L (ref 9–46)
AST: 15 U/L (ref 10–35)
Albumin: 4.4 g/dL (ref 3.6–5.1)
Alkaline phosphatase (APISO): 86 U/L (ref 40–115)
BUN: 15 mg/dL (ref 7–25)
CO2: 27 mmol/L (ref 20–32)
Calcium: 9.9 mg/dL (ref 8.6–10.3)
Chloride: 107 mmol/L (ref 98–110)
Creat: 0.94 mg/dL (ref 0.70–1.11)
GFR, Est African American: 84 mL/min/{1.73_m2} (ref >=60)
GFR, Est Non African American: 73 mL/min/{1.73_m2} (ref >=60)
Globulin: 2 g/dL (calc) (ref 1.9–3.7)
Glucose, Bld: 81 mg/dL (ref 65–139)
Potassium: 4.8 mmol/L (ref 3.5–5.3)
Sodium: 143 mmol/L (ref 135–146)
Total Bilirubin: 0.5 mg/dL (ref 0.2–1.2)
Total Protein: 6.4 g/dL (ref 6.1–8.1)

## 2017-03-26 LAB — LIPID PANEL
Cholesterol: 232 mg/dL — ABNORMAL HIGH (ref <200)
HDL: 39 mg/dL — ABNORMAL LOW (ref >40)
LDL Cholesterol (Calc): 162 mg/dL (calc) — ABNORMAL HIGH
Non-HDL Cholesterol (Calc): 193 mg/dL (calc) — ABNORMAL HIGH (ref <130)
Total CHOL/HDL Ratio: 5.9 (calc) — ABNORMAL HIGH (ref <5.0)
Triglycerides: 162 mg/dL — ABNORMAL HIGH (ref <150)

## 2017-03-26 LAB — PTH, INTACT AND CALCIUM
Calcium: 9.9 mg/dL (ref 8.6–10.3)
PTH: 57 pg/mL (ref 14–64)

## 2017-04-01 DIAGNOSIS — C44212 Basal cell carcinoma of skin of right ear and external auricular canal: Secondary | ICD-10-CM | POA: Diagnosis not present

## 2017-04-01 DIAGNOSIS — L820 Inflamed seborrheic keratosis: Secondary | ICD-10-CM | POA: Diagnosis not present

## 2017-04-01 DIAGNOSIS — D225 Melanocytic nevi of trunk: Secondary | ICD-10-CM | POA: Diagnosis not present

## 2017-04-01 DIAGNOSIS — L570 Actinic keratosis: Secondary | ICD-10-CM | POA: Diagnosis not present

## 2017-04-01 DIAGNOSIS — X32XXXD Exposure to sunlight, subsequent encounter: Secondary | ICD-10-CM | POA: Diagnosis not present

## 2017-04-18 DIAGNOSIS — M25569 Pain in unspecified knee: Secondary | ICD-10-CM | POA: Diagnosis not present

## 2017-04-29 DIAGNOSIS — Z85828 Personal history of other malignant neoplasm of skin: Secondary | ICD-10-CM | POA: Diagnosis not present

## 2017-04-29 DIAGNOSIS — Z08 Encounter for follow-up examination after completed treatment for malignant neoplasm: Secondary | ICD-10-CM | POA: Diagnosis not present

## 2017-05-02 NOTE — H&P (Signed)
Micheal [name] is an 84 y.o. male.    Chief Complaint: right knee pain  HPI: Pt is a 84 y.o. male complaining of right knee pain for multiple years. Pain had continually increased since the beginning. X-rays in the clinic show end-stage arthritic changes of the right knee. Pt has tried various conservative treatments which have failed to alleviate their symptoms, including injections and therapy. Various options are discussed with the patient. Risks, benefits and expectations were discussed with the patient. Patient understand the risks, benefits and expectations and wishes to proceed with surgery.   PCP:  Gayland Curry, DO  D/C Plans: Home  PMH: Past Medical History:  Diagnosis Date  . Allergic rhinitis, cause unspecified   . Carpal tunnel syndrome   . Cervical spondylosis without myelopathy   . Cervical spondylosis without myelopathy   . Cervicalgia   . Diverticulosis of colon (without mention of hemorrhage)   . Esophageal reflux   . Hypertrophy of prostate with urinary obstruction and other lower urinary tract symptoms (LUTS)   . Insomnia, unspecified   . Internal hemorrhoids without mention of complication   . Irritable bowel syndrome   . Macular degeneration (senile) of retina, unspecified   . Malignant neoplasm of prostate ([place])   . Neurogenic bladder, NOS   . Nonspecific (abnormal) findings on radiological and other examination of gastrointestinal tract   . Orthostatic hypotension   . Osteoarthrosis, unspecified whether generalized or localized, unspecified site   . Other and unspecified hyperlipidemia   . Other malaise and fatigue   . Other specified disorder of rectum and anus    Radiation Proctitus  . Paroxysmal supraventricular tachycardia ([place])   . Restless legs syndrome (RLS)   . Retinal detachment with retinal defect, unspecified   . Right bundle branch block    Incomplete  . Spermatocele    Right  . Trigger finger (acquired)   . Type II or unspecified  type diabetes mellitus without mention of complication, not stated as uncontrolled   . Type II or unspecified type diabetes mellitus without mention of complication, uncontrolled   . Unspecified essential hypertension   . Vitamin D deficiency     PSH: Past Surgical History:  Procedure Laterality Date  . Angiolipoma  1980   Right arm  . APPENDECTOMY  1951  . CHOLECYSTECTOMY  01/1994  . COLONOSCOPY  04/11/2010  . COLONOSCOPY W/ POLYPECTOMY  2005   Removed 2 (two) polyps  . DUPUYTREN CONTRACTURE RELEASE Bilateral 1989  . EYE SURGERY  2006   Left  . EYE SURGERY  2006   Retina reattachment, right  . EYE SURGERY  02/1994   Cataract surgery, right  . EYE SURGERY  01/1995   Cataract surgery, left  . KNEE SURGERY  1979  . PARATHYROIDECTOMY Left 12/27/2016   Procedure: LEFT INFERIOR PARATHYROIDECTOMY;  Surgeon: Armandina Gemma, MD;  Location: [facility];  Service: General;  Laterality: Left;  . POLYPECTOMY  1955   Vocal cord  . RETINAL DETACHMENT SURGERY Right 2005  . SHOULDER ARTHROSCOPY Right 03/22/15   [facility]  . TENDON RELEASE  01/1997   Fourth finger, left  . TRANSURETHRAL RESECTION OF PROSTATE  08/1989  . VITRECTOMY Left 04/23/2013   [facility]    Social History:  reports that he has never smoked. He has never used smokeless tobacco. He reports that he does not drink alcohol or use drugs.  Allergies:  Allergies  Allergen Reactions  . Aspirin Other (See Comments)  Bothers stomach, thins blood   . Atorvastatin Other (See Comments)    Muscular pain and weakness  . Prednisone Other (See Comments)    Upset stomach   . Simvastatin Other (See Comments)    Muscular pain and weakness  . Sulfa Antibiotics Other (See Comments)    Upset stomach   . Latex Rash  . Metformin Hcl Nausea Only  . Voltaren [Diclofenac Sodium] Other (See Comments)    Medications: No current facility-administered medications for this encounter.    Current Outpatient Medications  Medication Sig  Dispense Refill  . acetaminophen (TYLENOL) 500 MG tablet Take 1,000 mg by mouth every 8 (eight) hours as needed for moderate pain. Take one tablet as needed for pain.     Marland Kitchen alum & mag hydroxide-simeth (MAALOX/MYLANTA) 200-200-20 MG/5ML suspension Take 30 mLs by mouth as needed for indigestion or heartburn.    . cholecalciferol (VITAMIN D) 400 units TABS tablet Take 400 Units by mouth daily.    Marland Kitchen docusate sodium (COLACE) 100 MG capsule Take 100 mg by mouth daily.    . famotidine (PEPCID AC) 10 MG chewable tablet Chew 10 mg by mouth daily as needed for heartburn.    . Insulin Glargine (LANTUS SOLOSTAR) 100 UNIT/ML Solostar Pen Inject 45 Units into the skin daily at 10 pm. 45 mL 6  . Insulin Isophane & Regular Human (HUMULIN 70/30 KWIKPEN) (70-30) 100 UNIT/ML PEN Inject 20 units each mornig 30 pen 3  . Insulin Pen Needle (B-D ULTRAFINE III SHORT PEN) 31G X 8 MM MISC Use twice daily for insulin 180 each 3  . loratadine (CLARITIN) 10 MG tablet Take 10 mg by mouth daily.    . metoprolol tartrate (LOPRESSOR) 50 MG tablet Take 1 tablet (50 mg total) by mouth 2 (two) times daily. 180 tablet 1  . Probiotic Product (PROBIOTIC-10) CHEW Chew 2 each by mouth daily.     . tamsulosin (FLOMAX) 0.4 MG CAPS capsule TAKE 1 CAPSULE BY MOUTH  DAILY FOR PROSTATE 90 capsule 1  . trolamine salicylate (ASPERCREME) 10 % cream Apply 1 application topically as needed for muscle pain.      No results found for this or any previous visit (from the past 48 hour(s)). No results found.  ROS: Pain with rom of the right lower extremity  Physical Exam:  Alert and oriented 84 y.o. male in no acute distress Cranial nerves 2-12 intact Cervical spine: full rom with no tenderness, nv intact distally Chest: active breath sounds bilaterally, no wheeze rhonchi or rales Heart: regular rate and rhythm, no murmur Abd: non tender non distended with active bowel sounds Hip is stable with rom  Right knee with moderate crepitus with  rom Medial and lateral joint line tenderness nv intact distally Antalgic gait  Assessment/Plan Assessment: right knee end stage osteoarthritis  Plan: Patient will undergo a right total knee by Dr. Veverly [facility]ells at [facility]. Risks benefits and expectations were discussed with the patient. Patient understand risks, benefits and expectations and wishes to proceed.  Merla Riches PA-C, MPAS [facility] is now [employer] [address], [place], [place], [place] [address] Phone: [phone] [email or web address]  [employer]

## 2017-05-03 NOTE — Pre-Procedure Instructions (Signed)
Micheal [name]  05/03/2017      [id number] - [place], [place] - [address] [place] [address] Phone: [phone] Fax: [phone]  [place], [place] [address] [place] [address] Phone: [phone] Fax: [phone]    Your procedure is scheduled on May 17, 2017.  Report to [facility] Admitting at 830 AM.  Call this number if you have problems the morning of surgery:  [phone]   Remember:  Do not eat food or drink liquids after midnight.  Take these medicines the morning of surgery with A SIP OF WATER  Metoprolol (lopressor) Tylenol-if needed Famotidine (pepcid)-if needed Loratadine (claritin) tamsulosin (flomax)  7 days prior to surgery STOP taking any Aspirin (unless otherwise instructed by your surgeon), Aleve, Naproxen, Ibuprofen, Motrin, Advil, Goody's, BC's, all herbal medications, fish oil, and all vitamins  Continue all other medications as instructed by your physician except follow the above medication instructions before surgery   WHAT DO I DO ABOUT MY DIABETES MEDICATION?  Marland Kitchen Do not take oral diabetes medicines (pills) the morning of surgery.  . THE NIGHT BEFORE SURGERY, take 20 units of lantus insulin (1/2 of your normal dose).       . THE MORNING OF SURGERY, take no Humulin 70/30 insulin.  . The day of surgery, do not take other diabetes injectables, including Byetta (exenatide), Bydureon (exenatide ER), Victoza (liraglutide), or Trulicity (dulaglutide).  Reviewed and Endorsed by [facility] Patient Education Committee, August 2015  How to Manage Your Diabetes Before and After Surgery  Why is it important to control my blood sugar before and after surgery? . Improving blood sugar levels before and after surgery helps healing and can limit problems. . A way of improving blood sugar control is eating a healthy diet by: o  Eating  less sugar and carbohydrates o  Increasing activity/exercise o  Talking with your doctor about reaching your blood sugar goals . High blood sugars (greater than 180 mg/dL) can raise your risk of infections and slow your recovery, so you will need to focus on controlling your diabetes during the weeks before surgery. . Make sure that the doctor who takes care of your diabetes knows about your planned surgery including the date and location.  How do I manage my blood sugar before surgery? . Check your blood sugar at least 4 times a day, starting 2 days before surgery, to make sure that the level is not too high or low. o Check your blood sugar the morning of your surgery when you wake up and every 2 hours until you get to the Short Stay unit. . If your blood sugar is less than 70 mg/dL, you will need to treat for low blood sugar: o Do not take insulin. o Treat a low blood sugar (less than 70 mg/dL) with  cup of clear juice (cranberry or apple), 4 glucose tablets, OR glucose gel. Recheck blood sugar in 15 minutes after treatment (to make sure it is greater than 70 mg/dL). If your blood sugar is not greater than 70 mg/dL on recheck, call [phone] o  for further instructions. . Report your blood sugar to the short stay nurse when you get to Short Stay.  . If you are admitted to the hospital after surgery: o Your blood sugar will be checked by the staff and you will probably be given insulin after surgery (instead  of oral diabetes medicines) to make sure you have good blood sugar levels. o The goal for blood sugar control after surgery is 80-180 mg/dL.   Contacts, dentures or bridgework may not be worn into surgery.  Leave your suitcase in the car.  After surgery it may be brought to your room.  For patients admitted to the hospital, discharge time will be determined by your treatment team.  Patients discharged the day of surgery will not be allowed to drive home.   [place]- Preparing  For Surgery  Before surgery, you can play an important role. Because skin is not sterile, your skin needs to be as free of germs as possible. You can reduce the number of germs on your skin by washing with CHG (chlorahexidine gluconate) Soap before surgery.  CHG is an antiseptic cleaner which kills germs and bonds with the skin to continue killing germs even after washing.  Please do not use if you have an allergy to CHG or antibacterial soaps. If your skin becomes reddened/irritated stop using the CHG.  Do not shave (including legs and underarms) for at least 48 hours prior to first CHG shower. It is OK to shave your face.  Please follow these instructions carefully.   1. Shower the NIGHT BEFORE SURGERY and the MORNING OF SURGERY with CHG.   2. If you chose to wash your hair, wash your hair first as usual with your normal shampoo.  3. After you shampoo, rinse your hair and body thoroughly to remove the shampoo.  4. Use CHG as you would any other liquid soap. You can apply CHG directly to the skin and wash gently with a scrungie or a clean washcloth.   5. Apply the CHG Soap to your body ONLY FROM THE NECK DOWN.  Do not use on open wounds or open sores. Avoid contact with your eyes, ears, mouth and genitals (private parts). Wash Face and genitals (private parts)  with your normal soap.  6. Wash thoroughly, paying special attention to the area where your surgery will be performed.  7. Thoroughly rinse your body with warm water from the neck down.  8. DO NOT shower/wash with your normal soap after using and rinsing off the CHG Soap.  9. Pat yourself dry with a CLEAN TOWEL.  10. Wear CLEAN PAJAMAS to bed the night before surgery, wear comfortable clothes the morning of surgery  11. Place CLEAN SHEETS on your bed the night of your first shower and DO NOT SLEEP WITH PETS.  Day of Surgery: Do not apply any deodorants/lotions. Please wear clean clothes to the hospital/surgery center.     Do  not wear jewelry, make-up or nail polish.  Do not wear lotions, powders, or perfumes, or deodorant.  Men may shave face and neck.  Do not bring valuables to the hospital.  [facility] is not responsible for any belongings or valuables.  Please read over the following fact sheets that you were given. Pain Booklet, Coughing and Deep Breathing, MRSA Information and Surgical Site Infection Prevention

## 2017-05-06 ENCOUNTER — Other Ambulatory Visit: Payer: Self-pay

## 2017-05-06 ENCOUNTER — Encounter (HOSPITAL_COMMUNITY)
Admission: RE | Admit: 2017-05-06 | Discharge: 2017-05-06 | Disposition: A | Discharge status: Home / Self Care | Payer: Medicare Other | Source: Ambulatory Visit | Attending: Orthopedic Surgery | Admitting: Orthopedic Surgery

## 2017-05-06 ENCOUNTER — Encounter (HOSPITAL_COMMUNITY): Payer: Self-pay

## 2017-05-06 DIAGNOSIS — Z01812 Encounter for preprocedural laboratory examination: Secondary | ICD-10-CM | POA: Diagnosis not present

## 2017-05-06 LAB — GLUCOSE, CAPILLARY: Glucose-Capillary: 188 mg/dL — ABNORMAL HIGH (ref 65–99)

## 2017-05-06 LAB — CBC
HCT: 40.3 % (ref 39.0–52.0)
Hemoglobin: 13.3 g/dL (ref 13.0–17.0)
MCH: 29.7 pg (ref 26.0–34.0)
MCHC: 33 g/dL (ref 30.0–36.0)
MCV: 90 fL (ref 78.0–100.0)
Platelets: 226 10*3/uL (ref 150–400)
RBC: 4.48 MIL/uL (ref 4.22–5.81)
RDW: 12.8 % (ref 11.5–15.5)
WBC: 5.7 10*3/uL (ref 4.0–10.5)

## 2017-05-06 LAB — BASIC METABOLIC PANEL
Anion gap: 9 (ref 5–15)
BUN: 11 mg/dL (ref 6–20)
CO2: 22 mmol/L (ref 22–32)
Calcium: 9.6 mg/dL (ref 8.9–10.3)
Chloride: 109 mmol/L (ref 101–111)
Creatinine, Ser: 0.92 mg/dL (ref 0.61–1.24)
GFR calc Af Amer: >60 mL/min (ref >60)
GFR calc non Af Amer: >60 mL/min (ref >60)
Glucose, Bld: 159 mg/dL — ABNORMAL HIGH (ref 65–99)
Potassium: 4.8 mmol/L (ref 3.5–5.1)
Sodium: 140 mmol/L (ref 135–145)

## 2017-05-06 LAB — HEMOGLOBIN A1C
Hgb A1c MFr Bld: 8.2 % — ABNORMAL HIGH (ref 4.8–5.6)
Mean Plasma Glucose: 188.64 mg/dL

## 2017-05-06 LAB — SURGICAL PCR SCREEN
MRSA, PCR: NEGATIVE
Staphylococcus aureus: NEGATIVE

## 2017-05-06 NOTE — Progress Notes (Signed)
PCP - Hollace Kinnier Cardiologist - patient denies  Chest x-ray - n/a EKG - 03/25/2017 Stress Test - patient and patient's wife state it was so long ago if he did, they do not recall  ECHO - 08/09/2016 Cardiac Cath - patient denies  Sleep Study - patient denies  Fasting Blood Sugar - 110-130's Checks Blood Sugar 1-2 times a day   Anesthesia review: yes, history of SVT, RBBB on EKG  Patient denies shortness of breath, fever, cough and chest pain at PAT appointment   Patient verbalized understanding of instructions that were given to them at the PAT appointment. Patient was also instructed that they will need to review over the PAT instructions again at home before surgery.

## 2017-05-07 ENCOUNTER — Encounter (HOSPITAL_COMMUNITY): Payer: Self-pay | Admitting: Vascular Surgery

## 2017-05-07 NOTE — Progress Notes (Addendum)
Anesthesia Chart Review:  Case:  [id number] Date/Time:  05/17/17 1015   Procedure:  TOTAL KNEE ARTHROPLASTY (Right )   Anesthesia type:  Choice   Pre-op diagnosis:  Right knee osteoarthritis   Location:  [facility] OR ROOM 04 / [facility]   Surgeon:  Netta Cedars, MD      DISCUSSION: Patient is an 84 year old male for the above procedure. History includes PSVT, RBBB, DM2, prostate cancer, neurogenic bladder, primary hyperparathyroidism (s/p left inferior parathyroidectomy 12/27/16). Patient has medical clearance for surgery and his A1c has dropped from 9.2 to 8.2 in 4 months. From an anesthesia standpoint, I anticipate that he can proceed as planned if no acute changes; however, since A1c is > 7.5, she is anticipating that case will be postponed and will discuss further with surgeon and nurse case manager.   VS: BP (!) 131/59   Pulse 75   Temp 36.6 C   Resp 20   Ht 5\' 7"  (1.702 m)   Wt 161 lb 9.6 oz (73.3 kg)   SpO2 100%   BMI 25.31 kg/m   PROVIDERS: Patient Care Team: Netta Cedars, MD as Consulting Physician (Orthopedic Surgery) Rana Snare, MD as Consulting Physician (Urology)  Gayland Curry, DO as PCP She signed a note of medical clearance for this surgery. See 03/25/17 progress note in Epic for more details. Renato Shin, MD as Endocrinologist  LABS: CBC WNL. BMET WNL except glucose 159. A1c is 8.2, which is down from 9.2 on 01/21/17.   EKG: 03/25/17: SR, right BBB. EKG felt stable when compared to 08/01/16 tracing.   CV: Echo 08/09/16: Study Conclusions - Left ventricle: The cavity size was normal. Wall thickness was   normal. Systolic function was normal. The estimated ejection   fraction was in the range of 60% to 65%. Wall motion was normal;   there were no regional wall motion abnormalities. Doppler   parameters are consistent with abnormal left ventricular   relaxation (grade 1 diastolic dysfunction). - Aortic valve: Trileaflet; mildly thickened, mildly calcified   leaflets. There  was trivial regurgitation. - Mitral valve: Calcified annulus.  Past Medical History:  Diagnosis Date  . Allergic rhinitis, cause unspecified   . Carpal tunnel syndrome   . Cervical spondylosis without myelopathy   . Cervical spondylosis without myelopathy   . Cervicalgia   . Diverticulosis of colon (without mention of hemorrhage)   . Esophageal reflux   . Hypertrophy of prostate with urinary obstruction and other lower urinary tract symptoms (LUTS)   . Insomnia, unspecified   . Internal hemorrhoids without mention of complication   . Irritable bowel syndrome   . Macular degeneration (senile) of retina, unspecified   . Malignant neoplasm of prostate ([place])   . Neurogenic bladder, NOS   . Nonspecific (abnormal) findings on radiological and other examination of gastrointestinal tract   . Orthostatic hypotension   . Osteoarthrosis, unspecified whether generalized or localized, unspecified site   . Other and unspecified hyperlipidemia   . Other malaise and fatigue   . Other specified disorder of rectum and anus    Radiation Proctitus  . Paroxysmal supraventricular tachycardia ([place])   . Restless legs syndrome (RLS)   . Retinal detachment with retinal defect, unspecified   . Right bundle branch block    Incomplete  . Spermatocele    Right  . Trigger finger (acquired)   . Type II or unspecified type diabetes mellitus without mention of complication, not stated as uncontrolled   . Type  II or unspecified type diabetes mellitus without mention of complication, uncontrolled   . Unspecified essential hypertension    patient denies, states he has a "fast heart rate, but no high BP"  . Vitamin D deficiency     Past Surgical History:  Procedure Laterality Date  . Angiolipoma  1980   Right arm  . APPENDECTOMY  1951  . CHOLECYSTECTOMY  01/1994  . COLONOSCOPY  04/11/2010  . COLONOSCOPY W/ POLYPECTOMY  2005   Removed 2 (two) polyps  . DUPUYTREN CONTRACTURE RELEASE Bilateral 1989  . EYE  SURGERY  2006   Left  . EYE SURGERY  2006   Retina reattachment, right  . EYE SURGERY  02/1994   Cataract surgery, right  . EYE SURGERY  01/1995   Cataract surgery, left  . KNEE SURGERY  1979  . PARATHYROIDECTOMY Left 12/27/2016   Procedure: LEFT INFERIOR PARATHYROIDECTOMY;  Surgeon: Armandina Gemma, MD;  Location: [facility];  Service: General;  Laterality: Left;  . POLYPECTOMY  1955   Vocal cord  . RETINAL DETACHMENT SURGERY Right 2005  . SHOULDER ARTHROSCOPY Right 03/22/15   [place]  . TENDON RELEASE  01/1997   Fourth finger, left  . TRANSURETHRAL RESECTION OF PROSTATE  08/1989  . VITRECTOMY Left 04/23/2013   [facility]   Meds include vitamin D, Pepcid, Lantus 40 units nightly, Humulin 70/30 20 units every morning, Claritin, Lopressor, probiotic, Flomax.  [facility] Short Stay Center/Anesthesiology Phone [phone] 05/07/2017 12:20 PM

## 2017-05-17 ENCOUNTER — Inpatient Hospital Stay (HOSPITAL_COMMUNITY): Admission: RE | Admit: 2017-05-17 | Payer: Medicare Other | Source: Ambulatory Visit | Admitting: Orthopedic Surgery

## 2017-05-17 ENCOUNTER — Encounter (HOSPITAL_COMMUNITY): Admission: RE | Payer: Self-pay | Source: Ambulatory Visit

## 2017-05-17 SURGERY — ARTHROPLASTY, KNEE, TOTAL
Anesthesia: Choice | Laterality: Right

## 2017-05-20 ENCOUNTER — Encounter: Payer: Self-pay | Admitting: Internal Medicine

## 2017-05-20 ENCOUNTER — Ambulatory Visit (INDEPENDENT_AMBULATORY_CARE_PROVIDER_SITE_OTHER): Payer: Medicare Other | Admitting: Internal Medicine

## 2017-05-20 VITALS — BP 130/80 | HR 78 | Temp 98.0°F | Ht 66.0 in | Wt 161.0 lb

## 2017-05-20 DIAGNOSIS — E1140 Type 2 diabetes mellitus with diabetic neuropathy, unspecified: Secondary | ICD-10-CM | POA: Diagnosis not present

## 2017-05-20 DIAGNOSIS — G8929 Other chronic pain: Secondary | ICD-10-CM

## 2017-05-20 DIAGNOSIS — M159 Polyosteoarthritis, unspecified: Secondary | ICD-10-CM

## 2017-05-20 DIAGNOSIS — E1165 Type 2 diabetes mellitus with hyperglycemia: Secondary | ICD-10-CM | POA: Diagnosis not present

## 2017-05-20 DIAGNOSIS — IMO0002 Reserved for concepts with insufficient information to code with codable children: Secondary | ICD-10-CM

## 2017-05-20 DIAGNOSIS — M25561 Pain in right knee: Secondary | ICD-10-CM | POA: Diagnosis not present

## 2017-05-20 MED ORDER — INSULIN GLARGINE-LIXISENATIDE 100-33 UNT-MCG/ML ~~LOC~~ SOPN: 35.0000 [IU] | PEN_INJECTOR | Freq: Every day | SUBCUTANEOUS | 5 refills | Status: DC

## 2017-05-20 NOTE — Patient Instructions (Addendum)
Take your tylenol 1000mg  three times daily routinely for your arthritis pain.    Stop lantus.  Start soliqua 35 units each night between 8-10pm like you were taking the lantus.  Continue your humulin 70/30 in the morning.  DO NOT OVEREAT--STOP WHEN YOU ARE FULL OR YOU WILL GET NAUSEOUS.

## 2017-05-20 NOTE — Progress Notes (Signed)
Location:  [facility] clinic Provider:  Aleksis Jiggetts L. Mariea Clonts, D.O., C.M.D.  Code Status: DNR Goals of Care:  Advanced Directives 05/20/2017  Does Patient Have a Medical Advance Directive? Yes  Type of Advance Directive Living will  Does patient want to make changes to medical advance directive? No - Patient declined  Copy of [place] in Chart? -  Would patient like information on creating a medical advance directive? -     Chief Complaint  Patient presents with  . Follow-up    discuss diabetes A1c for knee surgery    HPI: Patient is a 84 y.o. male seen today for medical management of chronic diseases.    hba1c was 8.2 and orthopedic surgery will not perform his TKA at this level--must be under 7.5.  Pt reports never having an hba1c that low.    Shot has lasted since February.  He wants to get another shot to   Sugar record shows fastings:  81-192 Postprandial for evening meal:  149-217 Reports sweets cause it to peak and drop.  Carbs may keep it elevated all day even into the second day.   He's been out mowing and things which helps to lower it.  It can drop too quick. Takes his lantus 45 units between 8-10pm.  Ate out at lunch last night and had a philly cheesesteak with a small bag of potato chips and had a sweet cough drop (due to hayfever/allergies)--CBG was 200.  Does not change his insulin.   Takes his humulin 70/30 20 units each am, sometimes takes 22 units.   Twice a week, he has eggs, grits and bacon for breakfast.  Otherwise eats cereal with half a banana and blueberries in it.  They use 1% milk and splenda.  He hardly uses any oil to cook.   He carries a peppermint candy in case he drops his sugar.   He used to take pills--metformin--couldn't take it-bothered his stomach--tried two different times.    Reports his arthritis pain is all throughout his body so he debates if it's worth it to have the right knee done. Says he takes 2-3 tylenol per day here lately.   Can't take nsaids.    Has muscle pain all over so statins are not an option.  Past Medical History:  Diagnosis Date  . Allergic rhinitis, cause unspecified   . Carpal tunnel syndrome   . Cervical spondylosis without myelopathy   . Cervical spondylosis without myelopathy   . Cervicalgia   . Diverticulosis of colon (without mention of hemorrhage)   . Esophageal reflux   . Hypertrophy of prostate with urinary obstruction and other lower urinary tract symptoms (LUTS)   . Insomnia, unspecified   . Internal hemorrhoids without mention of complication   . Irritable bowel syndrome   . Macular degeneration (senile) of retina, unspecified   . Malignant neoplasm of prostate ([place])   . Neurogenic bladder, NOS   . Nonspecific (abnormal) findings on radiological and other examination of gastrointestinal tract   . Orthostatic hypotension   . Osteoarthrosis, unspecified whether generalized or localized, unspecified site   . Other and unspecified hyperlipidemia   . Other malaise and fatigue   . Other specified disorder of rectum and anus    Radiation Proctitus  . Paroxysmal supraventricular tachycardia ([place])   . Restless legs syndrome (RLS)   . Retinal detachment with retinal defect, unspecified   . Right bundle branch block    Incomplete  . Spermatocele  Right  . Trigger finger (acquired)   . Type II or unspecified type diabetes mellitus without mention of complication, not stated as uncontrolled   . Type II or unspecified type diabetes mellitus without mention of complication, uncontrolled   . Unspecified essential hypertension    patient denies, states he has a "fast heart rate, but no high BP"  . Vitamin D deficiency     Past Surgical History:  Procedure Laterality Date  . Angiolipoma  1980   Right arm  . APPENDECTOMY  1951  . CHOLECYSTECTOMY  01/1994  . COLONOSCOPY  04/11/2010  . COLONOSCOPY W/ POLYPECTOMY  2005   Removed 2 (two) polyps  . DUPUYTREN CONTRACTURE RELEASE  Bilateral 1989  . EYE SURGERY  2006   Left  . EYE SURGERY  2006   Retina reattachment, right  . EYE SURGERY  02/1994   Cataract surgery, right  . EYE SURGERY  01/1995   Cataract surgery, left  . KNEE SURGERY  1979  . PARATHYROIDECTOMY Left 12/27/2016   Procedure: LEFT INFERIOR PARATHYROIDECTOMY;  Surgeon: Armandina Gemma, MD;  Location: [facility];  Service: General;  Laterality: Left;  . POLYPECTOMY  1955   Vocal cord  . RETINAL DETACHMENT SURGERY Right 2005  . SHOULDER ARTHROSCOPY Right 03/22/15   [place]  . TENDON RELEASE  01/1997   Fourth finger, left  . TRANSURETHRAL RESECTION OF PROSTATE  08/1989  . VITRECTOMY Left 04/23/2013   [facility]    Allergies  Allergen Reactions  . Aspirin Other (See Comments)    Bothers stomach, thins blood   . Atorvastatin Other (See Comments)    Muscular pain and weakness  . Prednisone Other (See Comments)    Upset stomach, can take by shot not by mouth  . Simvastatin Other (See Comments)    Muscular pain and weakness  . Sulfa Antibiotics Other (See Comments)    Upset stomach   . Latex Rash  . Metformin Hcl Nausea Only  . Voltaren [Diclofenac Sodium] Other (See Comments)    Unknown    Outpatient Encounter Medications as of 05/20/2017  Medication Sig  . acetaminophen (TYLENOL) 500 MG tablet Take 1,000 mg by mouth every 8 (eight) hours as needed for moderate pain or headache.   Marland Kitchen alum & mag hydroxide-simeth (MAALOX/MYLANTA) 200-200-20 MG/5ML suspension Take 30 mLs by mouth as needed for indigestion or heartburn.  . cholecalciferol (VITAMIN D) 400 units TABS tablet Take 400 Units by mouth daily.  Marland Kitchen docusate sodium (COLACE) 100 MG capsule Take 100 mg by mouth daily.  . famotidine (PEPCID AC) 10 MG chewable tablet Chew 10 mg by mouth daily as needed for heartburn.  . Insulin Glargine (LANTUS SOLOSTAR) 100 UNIT/ML Solostar Pen Inject 45 Units into the skin daily at 10 pm.  . Insulin Isophane & Regular Human (HUMULIN 70/30 KWIKPEN) (70-30) 100  UNIT/ML PEN Inject 20 units each mornig  . Insulin Pen Needle (B-D ULTRAFINE III SHORT PEN) 31G X 8 MM MISC Use twice daily for insulin  . loratadine (CLARITIN) 10 MG tablet Take 10 mg by mouth daily.  . metoprolol tartrate (LOPRESSOR) 50 MG tablet Take 1 tablet (50 mg total) by mouth 2 (two) times daily.  . Probiotic Product (PROBIOTIC-10) CHEW Chew 1 each by mouth daily.   . tamsulosin (FLOMAX) 0.4 MG CAPS capsule TAKE 1 CAPSULE BY MOUTH  DAILY FOR PROSTATE  . trolamine salicylate (ASPERCREME) 10 % cream Apply 1 application topically as needed for muscle pain.   No facility-administered encounter medications  on file as of 05/20/2017.     Review of Systems:  Review of Systems  Constitutional: Negative for chills, fever and malaise/fatigue.  HENT: Positive for hearing loss.   Eyes: Positive for blurred vision.  Respiratory: Negative for cough and shortness of breath.   Cardiovascular: Negative for chest pain, palpitations and leg swelling.  Gastrointestinal: Negative for abdominal pain, blood in stool, constipation and melena.  Genitourinary: Negative for dysuria.  Musculoskeletal: Positive for joint pain. Negative for falls.  Skin: Negative for itching and rash.  Neurological: Negative for dizziness and loss of consciousness.  Endo/Heme/Allergies:       Diabetes   Psychiatric/Behavioral: Negative for depression and memory loss. The patient is not nervous/anxious.     Health Maintenance  Topic Date Due  . FOOT EXAM  08/29/2016  . OPHTHALMOLOGY EXAM  08/30/2016  . URINE MICROALBUMIN  06/05/2017  . INFLUENZA VACCINE  08/22/2017  . HEMOGLOBIN A1C  11/05/2017  . TETANUS/TDAP  08/22/2020  . PNA vac Low Risk Adult  Completed    Physical Exam: Vitals:   05/20/17 1117  BP: 130/80  Pulse: 78  Temp: 98 F (36.7 C)  TempSrc: Oral  SpO2: 98%  Weight: 161 lb (73 kg)  Height: 5\' 6"  (1.676 m)   Body mass index is 25.99 kg/m. Physical Exam  Constitutional: He is oriented to  person, place, and time. He appears well-developed and well-nourished. No distress.  HENT:  Head: Normocephalic and atraumatic.  Cardiovascular: Normal rate, regular rhythm, normal heart sounds and intact distal pulses.  Pulmonary/Chest: Effort normal and breath sounds normal. No respiratory distress.  Abdominal: Bowel sounds are normal.  Musculoskeletal: Normal range of motion.  Neurological: He is alert and oriented to person, place, and time.  Skin: Skin is warm and dry.  Psychiatric: He has a normal mood and affect.    Labs reviewed: Basic Metabolic Panel: Recent Labs    08/01/16 1410  01/21/17 0846 03/25/17 1017 05/06/17 1038  NA 138   < > 140 143 140  K 4.8   < > 4.1 4.8 4.8  CL 106   < > 106 107 109  CO2 22   < > 26 27 22   GLUCOSE 136*   < > 156* 81 159*  BUN 13   < > 12 15 11   CREATININE 0.94   < > 0.92 0.94 0.92  CALCIUM 11.1*   < > 9.2  9.2 9.9  9.9 9.6  TSH 1.30  --   --   --   --    < > = values in this interval not displayed.   Liver Function Tests: Recent Labs    06/05/16 0839 08/01/16 1410 09/04/16 0843 01/21/17 0846 03/25/17 1017  AST 16 17 17 14 15   ALT 11 14 16 12 11   ALKPHOS 87 93 78  --   --   BILITOT 0.5 0.5 0.4 0.6 0.5  PROT 6.3 6.4 5.9* 6.0* 6.4  ALBUMIN 4.2 4.4 4.2  --   --    No results for input(s): LIPASE, AMYLASE in the last 8760 hours. No results for input(s): AMMONIA in the last 8760 hours. CBC: Recent Labs    08/01/16 1410 12/25/16 1035 03/25/17 1017 05/06/17 1038  WBC 5.6 5.1 6.1 5.7  NEUTROABS 3,360  --  3,806  --   HGB 13.3 12.9* 13.6 13.3  HCT 39.2 38.6* 38.6 40.3  MCV 88.3 90.0 85.8 90.0  PLT 229 236 253 226   Lipid Panel: Recent Labs  09/04/16 0843 03/25/17 1017  CHOL 254* 232*  HDL 35* 39*  LDLCALC 162* 162*  TRIG 285* 162*  CHOLHDL 7.3* 5.9*   Lab Results  Component Value Date   HGBA1C 8.2 (H) 05/06/2017    Assessment/Plan 1. DM type 2, uncontrolled, with neuropathy ([place]) -extensively counseled  on diet, is active without a dedicated exercise program -will d/c lantus and begin soliqua in its place -he is to call back if he develops any hypoglycemia episodes, but goal is to get his sugar down so he can have his right tka - Hemoglobin A1c; Future - Basic metabolic panel; Future - Insulin Glargine-Lixisenatide (SOLIQUA) 100-33 UNT-MCG/ML SOPN; Inject 35 Units into the skin at bedtime.  Dispense: 3 mL; Refill: 5  2. Chronic pain of right knee -not too bad now with recent injection -will treat with scheduled tylenol for now and see how he does--emphasized importance of consistent use and avoiding more than 3g per 24 h  3. Generalized osteoarthritis of multiple sites -tx with tylenol, maintain activity  Labs/tests ordered:   Orders Placed This Encounter  Procedures  . Hemoglobin A1c    Standing Status:   Future    Standing Expiration Date:   11/19/2017  . Basic metabolic panel    Standing Status:   Future    Standing Expiration Date:   11/19/2017    Order Specific Question:   Has the patient fasted?    Answer:   Yes    Next appt:  08/19/2017  Maxim Bedel L. Li Fragoso, D.O. [facility] [address], [place] [address] Cell Phone (Mon-Fri 8am-5pm):  [phone] On Call:  [phone] & follow prompts after 5pm & weekends Office Phone:  [phone] Office Fax:  [phone]

## 2017-05-22 ENCOUNTER — Telehealth: Payer: Self-pay | Admitting: *Deleted

## 2017-05-22 NOTE — Telephone Encounter (Signed)
Patient notified and agreed.  

## 2017-05-22 NOTE — Telephone Encounter (Signed)
Patient stated that the new insulin Soliqua 35 units each night between 8-10 pm is causing him Jitteriness. Patient stated that he read the insert and it stated for him to take the medication in the morning. No other complaints. Please Advise.

## 2017-05-22 NOTE — Telephone Encounter (Signed)
I agree that he should take the soliqua in the morning within the hour before his breakfast and the 70/30 insulin within 15 mins before or after his evening meal.  He should try this and call back if the jitteriness persists or he finds his sugars are low.

## 2017-05-28 ENCOUNTER — Other Ambulatory Visit: Payer: Medicare Other

## 2017-05-30 ENCOUNTER — Ambulatory Visit: Payer: Medicare Other | Admitting: Internal Medicine

## 2017-06-25 DIAGNOSIS — M1711 Unilateral primary osteoarthritis, right knee: Secondary | ICD-10-CM | POA: Insufficient documentation

## 2017-06-25 DIAGNOSIS — M25562 Pain in left knee: Secondary | ICD-10-CM | POA: Diagnosis not present

## 2017-08-05 ENCOUNTER — Other Ambulatory Visit: Payer: Medicare Other

## 2017-08-08 DIAGNOSIS — M1711 Unilateral primary osteoarthritis, right knee: Secondary | ICD-10-CM | POA: Diagnosis not present

## 2017-08-15 ENCOUNTER — Other Ambulatory Visit: Payer: Medicare Other

## 2017-08-15 DIAGNOSIS — E1140 Type 2 diabetes mellitus with diabetic neuropathy, unspecified: Secondary | ICD-10-CM | POA: Diagnosis not present

## 2017-08-15 DIAGNOSIS — E210 Primary hyperparathyroidism: Secondary | ICD-10-CM

## 2017-08-15 DIAGNOSIS — E1151 Type 2 diabetes mellitus with diabetic peripheral angiopathy without gangrene: Secondary | ICD-10-CM | POA: Diagnosis not present

## 2017-08-15 DIAGNOSIS — I10 Essential (primary) hypertension: Secondary | ICD-10-CM | POA: Diagnosis not present

## 2017-08-15 DIAGNOSIS — IMO0002 Reserved for concepts with insufficient information to code with codable children: Secondary | ICD-10-CM

## 2017-08-15 DIAGNOSIS — E1165 Type 2 diabetes mellitus with hyperglycemia: Secondary | ICD-10-CM | POA: Diagnosis not present

## 2017-08-15 DIAGNOSIS — M1711 Unilateral primary osteoarthritis, right knee: Secondary | ICD-10-CM | POA: Diagnosis not present

## 2017-08-16 LAB — COMPLETE METABOLIC PANEL WITH GFR
AG Ratio: 2.4 (calc) (ref 1.0–2.5)
ALT: 12 U/L (ref 9–46)
AST: 15 U/L (ref 10–35)
Albumin: 4.4 g/dL (ref 3.6–5.1)
Alkaline phosphatase (APISO): 72 U/L (ref 40–115)
BUN: 14 mg/dL (ref 7–25)
CO2: 26 mmol/L (ref 20–32)
Calcium: 9.5 mg/dL (ref 8.6–10.3)
Chloride: 109 mmol/L (ref 98–110)
Creat: 0.89 mg/dL (ref 0.70–1.11)
GFR, Est African American: 89 mL/min/{1.73_m2} (ref >=60)
GFR, Est Non African American: 77 mL/min/{1.73_m2} (ref >=60)
Globulin: 1.8 g/dL (calc) — ABNORMAL LOW (ref 1.9–3.7)
Glucose, Bld: 92 mg/dL (ref 65–99)
Potassium: 4.1 mmol/L (ref 3.5–5.3)
Sodium: 141 mmol/L (ref 135–146)
Total Bilirubin: 0.5 mg/dL (ref 0.2–1.2)
Total Protein: 6.2 g/dL (ref 6.1–8.1)

## 2017-08-16 LAB — CBC WITH DIFFERENTIAL/PLATELET
Basophils Absolute: 39 cells/uL (ref 0–200)
Basophils Relative: 0.8 %
Eosinophils Absolute: 221 cells/uL (ref 15–500)
Eosinophils Relative: 4.5 %
HCT: 37.9 % — ABNORMAL LOW (ref 38.5–50.0)
Hemoglobin: 13.2 g/dL (ref 13.2–17.1)
Lymphs Abs: 936 cells/uL (ref 850–3900)
MCH: 30.5 pg (ref 27.0–33.0)
MCHC: 34.8 g/dL (ref 32.0–36.0)
MCV: 87.5 fL (ref 80.0–100.0)
MPV: 11.6 fL (ref 7.5–12.5)
Monocytes Relative: 13.6 %
Neutro Abs: 3038 cells/uL (ref 1500–7800)
Neutrophils Relative %: 62 %
Platelets: 200 10*3/uL (ref 140–400)
RBC: 4.33 10*6/uL (ref 4.20–5.80)
RDW: 12.6 % (ref 11.0–15.0)
Total Lymphocyte: 19.1 %
WBC mixed population: 666 cells/uL (ref 200–950)
WBC: 4.9 10*3/uL (ref 3.8–10.8)

## 2017-08-16 LAB — HEMOGLOBIN A1C
Hgb A1c MFr Bld: 8.4 % of total Hgb — ABNORMAL HIGH (ref <5.7)
Mean Plasma Glucose: 194 (calc)
eAG (mmol/L): 10.8 (calc)

## 2017-08-16 LAB — LIPID PANEL
Cholesterol: 251 mg/dL — ABNORMAL HIGH (ref <200)
HDL: 38 mg/dL — ABNORMAL LOW (ref >40)
LDL Cholesterol (Calc): 171 mg/dL (calc) — ABNORMAL HIGH
Non-HDL Cholesterol (Calc): 213 mg/dL (calc) — ABNORMAL HIGH (ref <130)
Total CHOL/HDL Ratio: 6.6 (calc) — ABNORMAL HIGH (ref <5.0)
Triglycerides: 256 mg/dL — ABNORMAL HIGH (ref <150)

## 2017-08-19 ENCOUNTER — Ambulatory Visit (INDEPENDENT_AMBULATORY_CARE_PROVIDER_SITE_OTHER): Payer: Medicare Other | Admitting: Internal Medicine

## 2017-08-19 ENCOUNTER — Encounter: Payer: Self-pay | Admitting: Internal Medicine

## 2017-08-19 VITALS — BP 128/70 | HR 77 | Temp 97.9°F | Ht 66.0 in | Wt 161.0 lb

## 2017-08-19 DIAGNOSIS — G8929 Other chronic pain: Secondary | ICD-10-CM

## 2017-08-19 DIAGNOSIS — I471 Supraventricular tachycardia, unspecified: Secondary | ICD-10-CM

## 2017-08-19 DIAGNOSIS — IMO0002 Reserved for concepts with insufficient information to code with codable children: Secondary | ICD-10-CM

## 2017-08-19 DIAGNOSIS — M159 Polyosteoarthritis, unspecified: Secondary | ICD-10-CM | POA: Diagnosis not present

## 2017-08-19 DIAGNOSIS — E663 Overweight: Secondary | ICD-10-CM

## 2017-08-19 DIAGNOSIS — E1165 Type 2 diabetes mellitus with hyperglycemia: Secondary | ICD-10-CM | POA: Diagnosis not present

## 2017-08-19 DIAGNOSIS — H353232 Exudative age-related macular degeneration, bilateral, with inactive choroidal neovascularization: Secondary | ICD-10-CM

## 2017-08-19 DIAGNOSIS — C61 Malignant neoplasm of prostate: Secondary | ICD-10-CM

## 2017-08-19 DIAGNOSIS — E1140 Type 2 diabetes mellitus with diabetic neuropathy, unspecified: Secondary | ICD-10-CM

## 2017-08-19 DIAGNOSIS — M25561 Pain in right knee: Secondary | ICD-10-CM | POA: Diagnosis not present

## 2017-08-19 DIAGNOSIS — R21 Rash and other nonspecific skin eruption: Secondary | ICD-10-CM

## 2017-08-19 MED ORDER — NYSTATIN-TRIAMCINOLONE 100000-0.1 UNIT/GM-% EX CREA: TOPICAL_CREAM | Freq: Four times a day (QID) | CUTANEOUS | 0 refills | Status: DC

## 2017-08-19 NOTE — Patient Instructions (Signed)
Fat and Cholesterol Restricted Diet Getting too much fat and cholesterol in your diet may cause health problems. Following this diet helps keep your fat and cholesterol at normal levels. This can keep you from getting sick. What types of fat should I choose?  Choose monosaturated and polyunsaturated fats. These are found in foods such as olive oil, canola oil, flaxseeds, walnuts, almonds, and seeds.  Eat more omega-3 fats. Good choices include salmon, mackerel, sardines, tuna, flaxseed oil, and ground flaxseeds.  Limit saturated fats. These are in animal products such as meats, butter, and cream. They can also be in plant products such as palm oil, palm kernel oil, and coconut oil.  Avoid foods with partially hydrogenated oils in them. These contain trans fats. Examples of foods that have trans fats are stick margarine, some tub margarines, cookies, crackers, and other baked goods. What general guidelines do I need to follow?  Check food labels. Look for the words "trans fat" and "saturated fat."  When preparing a meal: ? Fill half of your plate with vegetables and green salads. ? Fill one fourth of your plate with whole grains. Look for the word "whole" as the first word in the ingredient list. ? Fill one fourth of your plate with lean protein foods.  Eat more foods that have fiber, like apples, carrots, beans, peas, and barley.  Eat more home-cooked foods. Eat less at restaurants and buffets.  Limit or avoid alcohol.  Limit foods high in starch and sugar.  Limit fried foods.  Cook foods without frying them. Baking, boiling, grilling, and broiling are all great options.  Lose weight if you are overweight. Losing even a small amount of weight can help your overall health. It can also help prevent diseases such as diabetes and heart disease. What foods can I eat? Grains Whole grains, such as whole wheat or whole grain breads, crackers, cereals, and pasta. Unsweetened oatmeal,  bulgur, barley, quinoa, or brown rice. Corn or whole wheat flour tortillas. Vegetables Fresh or frozen vegetables (raw, steamed, roasted, or grilled). Green salads. Fruits All fresh, canned (in natural juice), or frozen fruits. Meat and Other Protein Products Ground beef (85% or leaner), grass-fed beef, or beef trimmed of fat. Skinless chicken or turkey. Ground chicken or turkey. Pork trimmed of fat. All fish and seafood. Eggs. Dried beans, peas, or lentils. Unsalted nuts or seeds. Unsalted canned or dry beans. Dairy Low-fat dairy products, such as skim or 1% milk, 2% or reduced-fat cheeses, low-fat ricotta or cottage cheese, or plain low-fat yogurt. Fats and Oils Tub margarines without trans fats. Light or reduced-fat mayonnaise and salad dressings. Avocado. Olive, canola, sesame, or safflower oils. Natural peanut or almond butter (choose ones without added sugar and oil). The items listed above may not be a complete list of recommended foods or beverages. Contact your dietitian for more options. What foods are not recommended? Grains White bread. White pasta. White rice. Cornbread. Bagels, pastries, and croissants. Crackers that contain trans fat. Vegetables White potatoes. Corn. Creamed or fried vegetables. Vegetables in a cheese sauce. Fruits Dried fruits. Canned fruit in light or heavy syrup. Fruit juice. Meat and Other Protein Products Fatty cuts of meat. Ribs, chicken wings, bacon, sausage, bologna, salami, chitterlings, fatback, hot dogs, bratwurst, and packaged luncheon meats. Liver and organ meats. Dairy Whole or 2% milk, cream, half-and-half, and cream cheese. Whole milk cheeses. Whole-fat or sweetened yogurt. Full-fat cheeses. Nondairy creamers and whipped toppings. Processed cheese, cheese spreads, or cheese curds. Sweets and Desserts Corn   syrup, sugars, honey, and molasses. Candy. Jam and jelly. Syrup. Sweetened cereals. Cookies, pies, cakes, donuts, muffins, and ice  cream. Fats and Oils Butter, stick margarine, lard, shortening, ghee, or bacon fat. Coconut, palm kernel, or palm oils. Beverages Alcohol. Sweetened drinks (such as sodas, lemonade, and fruit drinks or punches). The items listed above may not be a complete list of foods and beverages to avoid. Contact your dietitian for more information. This information is not intended to replace advice given to you by your health care provider. Make sure you discuss any questions you have with your health care provider. Document Released: 07/10/2011 Document Revised: 09/15/2015 Document Reviewed: 04/09/2013 Elsevier Interactive Patient Education  2018 [employer] Inc[employer]  

## 2017-08-19 NOTE — Progress Notes (Signed)
Location:  [facility] clinic Provider:  Kemiya Batdorf L. Mariea Clonts, D.O., C.M.D.  Code Status: DNR Goals of Care:  Advanced Directives 05/20/2017  Does Patient Have a Medical Advance Directive? Yes  Type of Advance Directive Living will  Does patient want to make changes to medical advance directive? No - Patient declined  Copy of [place] in Chart? -  Would patient like information on creating a medical advance directive? -     Chief Complaint  Patient presents with  . Medical Management of Chronic Issues    60mth follow-up    HPI: Patient is a 84 y.o. male seen today for medical management of chronic diseases.    Was up four times to the bathroom this morning.  He finally had to take imodium.    He's been getting the lubricant shots in his right knee at orthopedics.  He's had two so far.  His knees hurt so bad he can hardly walk.  He has taken some aleve since Thursday and the thinks it might be what messed up his stomach this morning.  He's going to go back on tylenol.  The injections have not really stopped it hurting.  Now left hurts as bad as right.  Hands ache now too. He's had all of his fingers operated on for locking.  He wakes up with the left arm numb from his elbow to his fingers.  Goes away when he stands up.  He says he does things he shouldn't at 80.    DMII:  He stopped the soliqua and is back on lantus with his humulin 70/30.  He felt awful. He wrings his hands.  hba1c 8.4 now (was down to 8.2).  Notices on weekends he must be eating too many carbs after church.  Takes thru Monday to get back on track.  79-119 in am lately.  At night 159-160.  Only one time was 69, but other hand was a little better.  Did have a cortisone shot between our visits.    vision continues to get worse. Per his visits, he's held his own.  Had three prior hemorrhages.   Remains on metoprolol for high pulse.    When he had prostate cancer, he got hormones and hen 40 shots of radiation.  PSA has been stable.  Still on flomax.     Past Medical History:  Diagnosis Date  . Allergic rhinitis, cause unspecified   . Carpal tunnel syndrome   . Cervical spondylosis without myelopathy   . Cervical spondylosis without myelopathy   . Cervicalgia   . Diverticulosis of colon (without mention of hemorrhage)   . Esophageal reflux   . Hypertrophy of prostate with urinary obstruction and other lower urinary tract symptoms (LUTS)   . Insomnia, unspecified   . Internal hemorrhoids without mention of complication   . Irritable bowel syndrome   . Macular degeneration (senile) of retina, unspecified   . Malignant neoplasm of prostate ([place])   . Neurogenic bladder, NOS   . Nonspecific (abnormal) findings on radiological and other examination of gastrointestinal tract   . Orthostatic hypotension   . Osteoarthrosis, unspecified whether generalized or localized, unspecified site   . Other and unspecified hyperlipidemia   . Other malaise and fatigue   . Other specified disorder of rectum and anus    Radiation Proctitus  . Paroxysmal supraventricular tachycardia ([place])   . Restless legs syndrome (RLS)   . Retinal detachment with retinal defect, unspecified   . Right bundle  branch block    Incomplete  . Spermatocele    Right  . Trigger finger (acquired)   . Type II or unspecified type diabetes mellitus without mention of complication, not stated as uncontrolled   . Type II or unspecified type diabetes mellitus without mention of complication, uncontrolled   . Unspecified essential hypertension    patient denies, states he has a "fast heart rate, but no high BP"  . Vitamin D deficiency     Past Surgical History:  Procedure Laterality Date  . Angiolipoma  1980   Right arm  . APPENDECTOMY  1951  . CHOLECYSTECTOMY  01/1994  . COLONOSCOPY  04/11/2010  . COLONOSCOPY W/ POLYPECTOMY  2005   Removed 2 (two) polyps  . DUPUYTREN CONTRACTURE RELEASE Bilateral 1989  . EYE SURGERY  2006   Left   . EYE SURGERY  2006   Retina reattachment, right  . EYE SURGERY  02/1994   Cataract surgery, right  . EYE SURGERY  01/1995   Cataract surgery, left  . KNEE SURGERY  1979  . PARATHYROIDECTOMY Left 12/27/2016   Procedure: LEFT INFERIOR PARATHYROIDECTOMY;  Surgeon: Armandina Gemma, MD;  Location: [facility];  Service: General;  Laterality: Left;  . POLYPECTOMY  1955   Vocal cord  . RETINAL DETACHMENT SURGERY Right 2005  . SHOULDER ARTHROSCOPY Right 03/22/15   [place]  . TENDON RELEASE  01/1997   Fourth finger, left  . TRANSURETHRAL RESECTION OF PROSTATE  08/1989  . VITRECTOMY Left 04/23/2013   [facility]    Allergies  Allergen Reactions  . Aspirin Other (See Comments)    Bothers stomach, thins blood   . Atorvastatin Other (See Comments)    Muscular pain and weakness  . Prednisone Other (See Comments)    Upset stomach, can take by shot not by mouth  . Simvastatin Other (See Comments)    Muscular pain and weakness  . Sulfa Antibiotics Other (See Comments)    Upset stomach   . Latex Rash  . Metformin Hcl Nausea Only  . Voltaren [Diclofenac Sodium] Other (See Comments)    Unknown    Outpatient Encounter Medications as of 08/19/2017  Medication Sig  . acetaminophen (TYLENOL) 500 MG tablet Take 1,000 mg by mouth 3 (three) times daily.  Marland Kitchen alum & mag hydroxide-simeth (MAALOX/MYLANTA) 200-200-20 MG/5ML suspension Take 30 mLs by mouth as needed for indigestion or heartburn.  . cholecalciferol (VITAMIN D) 400 units TABS tablet Take 400 Units by mouth daily.  Marland Kitchen docusate sodium (COLACE) 100 MG capsule Take 100 mg by mouth daily.  . famotidine (PEPCID AC) 10 MG chewable tablet Chew 10 mg by mouth daily as needed for heartburn.  . Insulin Glargine (LANTUS SOLOSTAR) 100 UNIT/ML Solostar Pen 45 units in the morning  . Insulin Isophane & Regular Human (HUMULIN 70/30 MIX) (70-30) 100 UNIT/ML PEN Inject 20 Units into the skin every evening.  . Insulin Pen Needle (B-D ULTRAFINE III SHORT PEN) 31G X 8  MM MISC Use twice daily for insulin  . loratadine (CLARITIN) 10 MG tablet Take 10 mg by mouth daily.  . metoprolol tartrate (LOPRESSOR) 50 MG tablet Take 1 tablet (50 mg total) by mouth 2 (two) times daily.  . Probiotic Product (PROBIOTIC-10) CHEW Chew 1 each by mouth daily.   . tamsulosin (FLOMAX) 0.4 MG CAPS capsule TAKE 1 CAPSULE BY MOUTH  DAILY FOR PROSTATE  . trolamine salicylate (ASPERCREME) 10 % cream Apply 1 application topically as needed for muscle pain.  . [DISCONTINUED] Insulin  Isophane & Regular Human (HUMULIN 70/30 KWIKPEN) (70-30) 100 UNIT/ML PEN Inject 20 units each mornig  . nystatin-triamcinolone (MYCOLOG II) cream Apply topically 4 (four) times daily.  . [DISCONTINUED] Insulin Glargine-Lixisenatide (SOLIQUA) 100-33 UNT-MCG/ML SOPN Inject 35 Units into the skin at bedtime.   No facility-administered encounter medications on file as of 08/19/2017.     Review of Systems:  Review of Systems  Constitutional: Negative for chills and fever.  HENT: Positive for hearing loss. Negative for congestion.   Eyes: Positive for blurred vision.  Respiratory: Negative for cough and shortness of breath.   Cardiovascular: Negative for chest pain, palpitations and leg swelling.  Gastrointestinal: Negative for abdominal pain.  Genitourinary: Negative for dysuria.  Musculoskeletal: Positive for joint pain. Negative for falls.  Neurological: Negative for dizziness.  Psychiatric/Behavioral: Negative for memory loss.    Health Maintenance  Topic Date Due  . FOOT EXAM  08/29/2016  . OPHTHALMOLOGY EXAM  08/30/2016  . URINE MICROALBUMIN  06/05/2017  . INFLUENZA VACCINE  08/22/2017  . HEMOGLOBIN A1C  02/15/2018  . TETANUS/TDAP  08/22/2020  . PNA vac Low Risk Adult  Completed    Physical Exam: Vitals:   08/19/17 1431  BP: 128/70  Pulse: 77  Temp: 97.9 F (36.6 C)  TempSrc: Oral  SpO2: 97%  Weight: 161 lb (73 kg)  Height: 5\' 6"  (1.676 m)   Body mass index is 25.99 kg/m. Physical  Exam  Constitutional: He is oriented to person, place, and time. He appears well-developed and well-nourished. No distress.  Eyes:  Glasses, poor vision--eye contact poor  Cardiovascular: Normal rate, regular rhythm, normal heart sounds and intact distal pulses.  Pulmonary/Chest: Effort normal and breath sounds normal. No respiratory distress.  Abdominal: Bowel sounds are normal.  Musculoskeletal: Normal range of motion.  tenderness of bilateral knees; difficulty getting up out of chair  Neurological: He is alert and oriented to person, place, and time.  Skin: Skin is warm and dry.  Psychiatric: He has a normal mood and affect.    Labs reviewed: Basic Metabolic Panel: Recent Labs    03/25/17 1017 05/06/17 1038 08/15/17 0820  NA 143 140 141  K 4.8 4.8 4.1  CL 107 109 109  CO2 27 22 26   GLUCOSE 81 159* 92  BUN 15 11 14   CREATININE 0.94 0.92 0.89  CALCIUM 9.9  9.9 9.6 9.5   Liver Function Tests: Recent Labs    09/04/16 0843 01/21/17 0846 03/25/17 1017 08/15/17 0820  AST 17 14 15 15   ALT 16 12 11 12   ALKPHOS 78  --   --   --   BILITOT 0.4 0.6 0.5 0.5  PROT 5.9* 6.0* 6.4 6.2  ALBUMIN 4.2  --   --   --    No results for input(s): LIPASE, AMYLASE in the last 8760 hours. No results for input(s): AMMONIA in the last 8760 hours. CBC: Recent Labs    03/25/17 1017 05/06/17 1038 08/15/17 0820  WBC 6.1 5.7 4.9  NEUTROABS 3,806  --  3,038  HGB 13.6 13.3 13.2  HCT 38.6 40.3 37.9*  MCV 85.8 90.0 87.5  PLT 253 226 200   Lipid Panel: Recent Labs    09/04/16 0843 03/25/17 1017 08/15/17 0820  CHOL 254* 232* 251*  HDL 35* 39* 38*  LDLCALC 162* 162* 171*  TRIG 285* 162* 256*  CHOLHDL 7.3* 5.9* 6.6*   Lab Results  Component Value Date   HGBA1C 8.4 (H) 08/15/2017    Assessment/Plan 1. Groin rash -  nystatin-triamcinolone (MYCOLOG II) cream; Apply topically 4 (four) times daily.  Dispense: 60 g; Refill: 0 sent in  2. DM type 2, uncontrolled, with neuropathy  ([place]) - cont lantus and humulin mix - staff calling rep to see if they can bring any samples of lantus or toujeo--pt nearing donut hole - CBC with Differential/Platelet; Future - Basic metabolic panel; Future - Hemoglobin A1c; Future  3. Chronic pain of right knee -getting injections but not noticing a difference, continue topicals and tylenols, aleve has already upset his stomach after just a week of use  4. Generalized osteoarthritis of multiple sites -cont tylenol and aspercreme  5. Overweight with body mass index (BMI) 25.0-29.9 -noted, diet information provided  6. Exudative age-related macular degeneration of both eyes with inactive choroidal neovascularization ([place]) -advanced, cont to follow with ophtho  7. Paroxysmal supraventricular tachycardia ([facility]) -not problematic since on metoprolol, rate controlled  8. Malignant neoplasm of prostate ([place]) -stable with flomax, no change in hs symptoms  Labs/tests ordered:   Orders Placed This Encounter  Procedures  . CBC with Differential/Platelet    Standing Status:   Future    Standing Expiration Date:   04/20/2018  . Basic metabolic panel    Standing Status:   Future    Standing Expiration Date:   04/20/2018    Order Specific Question:   Has the patient fasted?    Answer:   Yes  . Hemoglobin A1c    Standing Status:   Future    Standing Expiration Date:   04/20/2018    Next appt:  12/30/2017  Micheal [name] L. Trezure Cronk, D.O. [facility] [address], [place] [address] Cell Phone (Mon-Fri 8am-5pm):  [phone] On Call:  [phone] & follow prompts after 5pm & weekends Office Phone:  [phone] Office Fax:  [phone]

## 2017-08-22 DIAGNOSIS — M1711 Unilateral primary osteoarthritis, right knee: Secondary | ICD-10-CM | POA: Diagnosis not present

## 2017-08-26 DIAGNOSIS — H353233 Exudative age-related macular degeneration, bilateral, with inactive scar: Secondary | ICD-10-CM | POA: Diagnosis not present

## 2017-08-29 ENCOUNTER — Other Ambulatory Visit: Payer: Self-pay | Admitting: *Deleted

## 2017-08-29 DIAGNOSIS — S80812A Abrasion, left lower leg, initial encounter: Secondary | ICD-10-CM | POA: Diagnosis not present

## 2017-08-29 MED ORDER — INSULIN GLARGINE 100 UNIT/ML SOLOSTAR PEN: PEN_INJECTOR | SUBCUTANEOUS | 3 refills | Status: DC

## 2017-08-29 NOTE — Telephone Encounter (Signed)
[facility] 

## 2017-10-03 DIAGNOSIS — M1711 Unilateral primary osteoarthritis, right knee: Secondary | ICD-10-CM | POA: Diagnosis not present

## 2017-10-14 ENCOUNTER — Encounter: Payer: Self-pay | Admitting: Neurology

## 2017-10-14 ENCOUNTER — Ambulatory Visit: Payer: Medicare Other | Admitting: Internal Medicine

## 2017-10-14 ENCOUNTER — Telehealth: Payer: Self-pay | Admitting: Neurology

## 2017-10-14 ENCOUNTER — Ambulatory Visit (INDEPENDENT_AMBULATORY_CARE_PROVIDER_SITE_OTHER): Payer: Medicare Other | Admitting: Neurology

## 2017-10-14 VITALS — BP 119/73 | HR 85 | Ht 66.0 in | Wt 155.5 lb

## 2017-10-14 DIAGNOSIS — R202 Paresthesia of skin: Secondary | ICD-10-CM

## 2017-10-14 DIAGNOSIS — M542 Cervicalgia: Secondary | ICD-10-CM | POA: Diagnosis not present

## 2017-10-14 MED ORDER — GABAPENTIN 100 MG PO CAPS: ORAL_CAPSULE | ORAL | 3 refills | Status: DC

## 2017-10-14 NOTE — Progress Notes (Signed)
PATIENT: Micheal [name] DOB: [birth year]  Chief Complaint  Patient presents with  . Diabetic Neuropathy    He is here with his wife, Micheal [name].  Reports burning, tingling and numbness in his left arm and bilateral hands (left side worse).  He is also sensitive to any covers being on his feet at night.  He is trying to get his A1C down so he can have left knee surgery.  He has periodic steroid injections into his knee for pain.   Marland Kitchen PCP    Gayland Curry, DO     HISTORICAL  Micheal [name] is a 84 years old male, seen in request by his primary care physician Dr. Mariea Clonts, [facility] for evaluation of paresthesia, initial evaluation was on October 14, 2017.  He is accompanied by his wife at today's clinical visit  I have reviewed and summarized the referring note from the referring physician.  He has past medical history of diabetes, insulin-dependent, hypertension, macular degeneration, retinal detachment, poor vision, he gave up driving.  He could not read, can count fingers only  He had a history of chronic neck pain, difficulty turning his neck, especially to the left side, since 2018, he noticed worsening neck pain, radiating pain to left side, also noticed intermittent numbness tingling of his left hand during the day, 4 nights out of a week, he woke up noticed worsening left hand paresthesia, feeling like hot water running down, he has to move his hand to improve his sensation, also complains of mild bilateral hands joints pain, difficulty making a tight grip, he worked at [employer] job, previously had multiple surgery for trigger fingers, also had bilateral carpal tunnel release surgery many years ago,  He has chronic low back pain, right knee pain,, mild unsteady gait, intermittent numbness of right hand involving all 5 fingers, palm of his right hand  REVIEW OF SYSTEMS: Full 14 system review of systems performed and notable only for fatigue, hearing loss, incontinence, easy  bruising, feeling hot, cold, joint pain, allergy, runny nose, numbness, weakness, sleepiness, restless leg,, not enough sleep, decreased energy All other review of systems were negative.  ALLERGIES: Allergies  Allergen Reactions  . Aspirin Other (See Comments)    Bothers stomach, thins blood   . Atorvastatin Other (See Comments)    Muscular pain and weakness  . Prednisone Other (See Comments)    Upset stomach, can take by shot not by mouth  . Simvastatin Other (See Comments)    Muscular pain and weakness  . Sulfa Antibiotics Other (See Comments)    Upset stomach   . Latex Rash  . Metformin Hcl Nausea Only  . Voltaren [Diclofenac Sodium] Other (See Comments)    Unknown    HOME MEDICATIONS: Current Outpatient Medications  Medication Sig Dispense Refill  . acetaminophen (TYLENOL) 500 MG tablet Take 1,000 mg by mouth 3 (three) times daily.    Marland Kitchen alum & mag hydroxide-simeth (MAALOX/MYLANTA) 200-200-20 MG/5ML suspension Take 30 mLs by mouth as needed for indigestion or heartburn.    . cholecalciferol (VITAMIN D) 400 units TABS tablet Take 400 Units by mouth daily.    Marland Kitchen docusate sodium (COLACE) 100 MG capsule Take 100 mg by mouth daily.    . famotidine (PEPCID AC) 10 MG chewable tablet Chew 10 mg by mouth daily as needed for heartburn.    . Insulin Glargine (LANTUS SOLOSTAR) 100 UNIT/ML Solostar Pen 45 units in the morning 15 mL 3  . Insulin Isophane & Regular  Human (HUMULIN 70/30 MIX) (70-30) 100 UNIT/ML PEN Inject 25 Units into the skin every evening.     . Insulin Pen Needle (B-D ULTRAFINE III SHORT PEN) 31G X 8 MM MISC Use twice daily for insulin 180 each 3  . loratadine (CLARITIN) 10 MG tablet Take 10 mg by mouth daily.    . metoprolol tartrate (LOPRESSOR) 50 MG tablet Take 1 tablet (50 mg total) by mouth 2 (two) times daily. 180 tablet 1  . nystatin-triamcinolone (MYCOLOG II) cream Apply topically 4 (four) times daily. 60 g 0  . Probiotic Product (PROBIOTIC-10) CHEW Chew 1 each by  mouth daily.     . tamsulosin (FLOMAX) 0.4 MG CAPS capsule TAKE 1 CAPSULE BY MOUTH  DAILY FOR PROSTATE 90 capsule 1  . trolamine salicylate (ASPERCREME) 10 % cream Apply 1 application topically as needed for muscle pain.     No current facility-administered medications for this visit.     PAST MEDICAL HISTORY: Past Medical History:  Diagnosis Date  . Allergic rhinitis, cause unspecified   . Carpal tunnel syndrome   . Cervical spondylosis without myelopathy   . Cervical spondylosis without myelopathy   . Cervicalgia   . Diverticulosis of colon (without mention of hemorrhage)   . Esophageal reflux   . Hypertrophy of prostate with urinary obstruction and other lower urinary tract symptoms (LUTS)   . Insomnia, unspecified   . Internal hemorrhoids without mention of complication   . Irritable bowel syndrome   . Macular degeneration (senile) of retina, unspecified   . Malignant neoplasm of prostate ([place])   . Neurogenic bladder, NOS   . Neuropathy   . Nonspecific (abnormal) findings on radiological and other examination of gastrointestinal tract   . Orthostatic hypotension   . Osteoarthrosis, unspecified whether generalized or localized, unspecified site   . Other and unspecified hyperlipidemia   . Other malaise and fatigue   . Other specified disorder of rectum and anus    Radiation Proctitus  . Paroxysmal supraventricular tachycardia ([place])   . Restless legs syndrome (RLS)   . Retinal detachment with retinal defect, unspecified   . Right bundle branch block    Incomplete  . Spermatocele    Right  . Trigger finger (acquired)   . Type II or unspecified type diabetes mellitus without mention of complication, not stated as uncontrolled   . Type II or unspecified type diabetes mellitus without mention of complication, uncontrolled   . Unspecified essential hypertension    patient denies, states he has a "fast heart rate, but no high BP"  . Vitamin D deficiency     PAST SURGICAL  HISTORY: Past Surgical History:  Procedure Laterality Date  . Angiolipoma  1980   Right arm  . APPENDECTOMY  1951  . CHOLECYSTECTOMY  01/1994  . COLONOSCOPY  04/11/2010  . COLONOSCOPY W/ POLYPECTOMY  2005   Removed 2 (two) polyps  . DUPUYTREN CONTRACTURE RELEASE Bilateral 1989  . EYE SURGERY  2006   Left  . EYE SURGERY  2006   Retina reattachment, right  . EYE SURGERY  02/1994   Cataract surgery, right  . EYE SURGERY  01/1995   Cataract surgery, left  . KNEE SURGERY  1979  . PARATHYROIDECTOMY Left 12/27/2016   Procedure: LEFT INFERIOR PARATHYROIDECTOMY;  Surgeon: Armandina Gemma, MD;  Location: [facility];  Service: General;  Laterality: Left;  . POLYPECTOMY  1955   Vocal cord  . RETINAL DETACHMENT SURGERY Right 2005  . SHOULDER ARTHROSCOPY Right 03/22/15  [facility]  . TENDON RELEASE  01/1997   seven fingers  . TRANSURETHRAL RESECTION OF PROSTATE  08/1989  . VITRECTOMY Left 04/23/2013   [facility]    FAMILY HISTORY: Family History  Problem Relation Age of Onset  . Depression Mother   . Diabetes Mother   . Mental illness Mother        OCD  . Cerebral aneurysm Father        age 52  . Hypertension Sister   . Diabetes Daughter   . Hyperlipidemia Daughter   . Hyperparathyroidism Neg Hx     SOCIAL HISTORY: Social History   Socioeconomic History  . Marital status: Married    Spouse name: Not on file  . Number of children: 4  . Years of education: 10th  . Highest education level: Not on file  Occupational History  . Occupation: Retired  Scientific laboratory technician  . Financial resource strain: Not hard at all  . Food insecurity:    Worry: Never true    Inability: Never true  . Transportation needs:    Medical: No    Non-medical: No  Tobacco Use  . Smoking status: Never Smoker  . Smokeless tobacco: Never Used  Substance and Sexual Activity  . Alcohol use: No    Alcohol/week: 0.0 standard drinks  . Drug use: No  . Sexual activity: Never  Lifestyle  . Physical activity:     Days per week: 7 days    Minutes per session: 30 min  . Stress: Not at all  Relationships  . Social connections:    Talks on phone: More than three times a week    Gets together: More than three times a week    Attends religious service: More than 4 times per year    Active member of club or organization: No    Attends meetings of clubs or organizations: Never    Relationship status: Married  . Intimate partner violence:    Fear of current or ex partner: No    Emotionally abused: No    Physically abused: No    Forced sexual activity: No  Other Topics Concern  . Not on file  Social History Narrative   Married   Never smoked   Alcohol none   Exercise walks dog twice a day   Living will   Left-handed   2 cups caffeine per day        PHYSICAL EXAM   Vitals:   10/14/17 1307  BP: 119/73  Pulse: 85  Weight: 155 lb 8 oz (70.5 kg)  Height: 5\' 6"  (1.676 m)    Not recorded      Body mass index is 25.1 kg/m.  PHYSICAL EXAMNIATION:  Gen: NAD, conversant, well nourised, obese, well groomed                     Cardiovascular: Regular rate rhythm, no peripheral edema, warm, nontender. Eyes: Conjunctivae clear without exudates or hemorrhage Neck: Supple, no carotid bruits. Pulmonary: Clear to auscultation bilaterally   NEUROLOGICAL EXAM:  MENTAL STATUS: Speech:    Speech is normal; fluent and spontaneous with normal comprehension.  Cognition:     Orientation to time, place and person     Normal recent and remote memory     Normal Attention span and concentration     Normal Language, naming, repeating,spontaneous speech     Fund of knowledge   CRANIAL NERVES: CN II: Pupils were small, sluggish reactive, preserved peripheral visual  field, finger counting only, very poor central vision CN III, IV, VI: extraocular movement are normal. No ptosis. CN V: Facial sensation is intact to pinprick in all 3 divisions bilaterally. Corneal responses are intact.  CN VII: Face is  symmetric with normal eye closure and smile. CN VIII: Hearing is normal to rubbing fingers CN IX, X: Palate elevates symmetrically. Phonation is normal. CN XI: Difficulty turning his neck, especially to the left side CN XII: Tongue is midline with normal movements and no atrophy.  MOTOR: There is no pronator drift of out-stretched arms. Muscle bulk and tone are normal. Muscle strength is normal.  REFLEXES: Reflexes are hypoactive and symmetric at the biceps, triceps, knees, and ankles. Plantar responses are flexor.  SENSORY: Intact to light touch, pinprick, positional sensation and vibratory sensation are intact in fingers and toes.  COORDINATION: Rapid alternating movements and fine finger movements are intact. There is no dysmetria on finger-to-nose and heel-knee-shin.    GAIT/STANCE: He needs pushed up to get up from seated position, wide-based, unsteady, cautious gait Romberg is absent.   DIAGNOSTIC DATA (LABS, IMAGING, TESTING) - I reviewed patient records, labs, notes, testing and imaging myself where available.   ASSESSMENT AND PLAN  Micheal [name] is a 84 y.o. male   Bilateral upper extremity paresthesia  Differentiation diagnosis include cervical radiculopathy versus peripheral neuropathy, upper extremity neuropathy  EMG nerve conduction study  MRI of cervical spine  Gabapentin 100 mg titrating to 3 tablets every night to help him sleep   Marcial Pacas, M.D. Ph.D.  [facility] [address] [place], [place] [address] Ph: [phone] Fax: [phone]  CC:  Gayland Curry, DO

## 2017-10-14 NOTE — Telephone Encounter (Signed)
Medicare/[place] order sent to [facility]. They will reach out to the pt to schedule.

## 2017-10-21 ENCOUNTER — Ambulatory Visit
Admission: RE | Admit: 2017-10-21 | Discharge: 2017-10-21 | Disposition: A | Discharge status: Home / Self Care | Payer: Medicare Other | Source: Ambulatory Visit | Attending: Neurology | Admitting: Neurology

## 2017-10-21 DIAGNOSIS — R202 Paresthesia of skin: Secondary | ICD-10-CM

## 2017-10-21 DIAGNOSIS — M542 Cervicalgia: Secondary | ICD-10-CM | POA: Diagnosis not present

## 2017-10-22 ENCOUNTER — Telehealth: Payer: Self-pay | Admitting: Neurology

## 2017-10-22 NOTE — Telephone Encounter (Signed)
Please call patient, MRI of cervical spine showed multilevel degenerative changes, there is no evidence of spinal canal stenosis, variable degree of foraminal narrowing at different levels, there is no evidence of nerve root compression.    IMPRESSION: This MRI of the cervical spine without contrast shows multilevel degenerative changes as detailed above.  Most significant findings are: 1.    At C3-C4, there appears to be fusion associated with reduced disc height.  There is no nerve root compression or spinal stenosis. 2.    At C4-C5, there is mild spinal stenosis due to disc protrusion, uncovertebral spurring and mild left facet hypertrophy.  There is moderate left foraminal narrowing but no definite nerve root compression. 3.    At C5-C6, there are degenerative changes causing moderate right foraminal narrowing.  There does not appear to be nerve root compression or spinal stenosis. 4.    There are mild degenerative changes at the other cervical levels that do not lead to spinal stenosis or nerve root compression.

## 2017-10-22 NOTE — Telephone Encounter (Signed)
Spoke to patient - he is aware of his MRI results.  He will keep his pending appt for [place] on 11/13/17.

## 2017-10-22 NOTE — Telephone Encounter (Signed)
Spoke to patient - he is aware of his MRI results.  He will keep his pending appt for [place] on 11/13/17.

## 2017-10-22 NOTE — Telephone Encounter (Signed)
Please call patient, MRI of cervical spine showed multilevel degenerative changes, there is no evidence of spinal nerve roots, or spinal cord compression.   IMPRESSION: This MRI of the cervical spine without contrast shows multilevel degenerative changes as detailed above.  Most significant findings are: 1.    At C3-C4, there appears to be fusion associated with reduced disc height.  There is no nerve root compression or spinal stenosis. 2.    At C4-C5, there is mild spinal stenosis due to disc protrusion, uncovertebral spurring and mild left facet hypertrophy.  There is moderate left foraminal narrowing but no definite nerve root compression. 3.    At C5-C6, there are degenerative changes causing moderate right foraminal narrowing.  There does not appear to be nerve root compression or spinal stenosis. 4.    There are mild degenerative changes at the other cervical levels that do not lead to spinal stenosis or nerve root compression.

## 2017-10-28 DIAGNOSIS — X32XXXD Exposure to sunlight, subsequent encounter: Secondary | ICD-10-CM | POA: Diagnosis not present

## 2017-10-28 DIAGNOSIS — D225 Melanocytic nevi of trunk: Secondary | ICD-10-CM | POA: Diagnosis not present

## 2017-10-28 DIAGNOSIS — L570 Actinic keratosis: Secondary | ICD-10-CM | POA: Diagnosis not present

## 2017-11-01 DIAGNOSIS — Z23 Encounter for immunization: Secondary | ICD-10-CM | POA: Diagnosis not present

## 2017-11-11 ENCOUNTER — Other Ambulatory Visit: Payer: Self-pay | Admitting: Internal Medicine

## 2017-11-11 DIAGNOSIS — I471 Supraventricular tachycardia: Secondary | ICD-10-CM

## 2017-11-13 ENCOUNTER — Ambulatory Visit (INDEPENDENT_AMBULATORY_CARE_PROVIDER_SITE_OTHER): Payer: Medicare Other | Admitting: Neurology

## 2017-11-13 DIAGNOSIS — M542 Cervicalgia: Secondary | ICD-10-CM

## 2017-11-13 DIAGNOSIS — R202 Paresthesia of skin: Secondary | ICD-10-CM

## 2017-11-13 DIAGNOSIS — G5602 Carpal tunnel syndrome, left upper limb: Secondary | ICD-10-CM | POA: Diagnosis not present

## 2017-11-13 NOTE — Progress Notes (Signed)
PATIENT: Micheal [name] DOB: 07/01/1928  No chief complaint on file.    HISTORICAL  Micheal [name] is a 84 years old male, seen in request by his primary care physician Dr. Mariea Clonts, [facility] for evaluation of paresthesia, initial evaluation was on October 14, 2017.  He is accompanied by his wife at today's clinical visit  I have reviewed and summarized the referring note from the referring physician.  He has past medical history of diabetes, insulin-dependent, hypertension, macular degeneration, retinal detachment, poor vision, he gave up driving.  He could not read, can count fingers only  He had a history of chronic neck pain, difficulty turning his neck, especially to the left side, since 2018, he noticed worsening neck pain, radiating pain to left side, also noticed intermittent numbness tingling of his left hand during the day, 4 nights out of a week, he woke up noticed worsening left hand paresthesia, feeling like hot water running down, he has to move his hand to improve his sensation, also complains of mild bilateral hands joints pain, difficulty making a tight grip, he worked at [employer] job, previously had multiple surgery for trigger fingers, also had bilateral carpal tunnel release surgery many years ago,  He has chronic low back pain, right knee pain,, mild unsteady gait, intermittent numbness of right hand involving all 5 fingers, palm of his right hand  UPDATE Nov 13 2017: We have personally reviewed MRI cervical spine on October 21, 2017, multilevel degenerative changes, most severe at L4-5 level, with left facet hypertrophy, uncovertebral spurring, evidence of moderate left foraminal narrowing, there was no significant canal stenosis, no evidence of spinal cord compression  EMG nerve conduction study showed evidence of severe left carpal tunnel syndrome, mild left ulnar neuropathy across the wrist, chronic left cervical radiculopathy, no evidence of active  process.  Today he reported a previous history of left wrist fracture, still has limited range of motion of left wrist  REVIEW OF SYSTEMS: Full 14 system review of systems performed and notable only for as above All other review of systems were negative.  ALLERGIES: Allergies  Allergen Reactions  . Aspirin Other (See Comments)    Bothers stomach, thins blood   . Atorvastatin Other (See Comments)    Muscular pain and weakness  . Prednisone Other (See Comments)    Upset stomach, can take by shot not by mouth  . Simvastatin Other (See Comments)    Muscular pain and weakness  . Sulfa Antibiotics Other (See Comments)    Upset stomach   . Latex Rash  . Metformin Hcl Nausea Only  . Voltaren [Diclofenac Sodium] Other (See Comments)    Unknown    HOME MEDICATIONS: Current Outpatient Medications  Medication Sig Dispense Refill  . acetaminophen (TYLENOL) 500 MG tablet Take 1,000 mg by mouth 3 (three) times daily.    Marland Kitchen alum & mag hydroxide-simeth (MAALOX/MYLANTA) 200-200-20 MG/5ML suspension Take 30 mLs by mouth as needed for indigestion or heartburn.    . cholecalciferol (VITAMIN D) 400 units TABS tablet Take 400 Units by mouth daily.    Marland Kitchen docusate sodium (COLACE) 100 MG capsule Take 100 mg by mouth daily.    . famotidine (PEPCID AC) 10 MG chewable tablet Chew 10 mg by mouth daily as needed for heartburn.    . gabapentin (NEURONTIN) 100 MG capsule One to 3 tabs every night. 90 capsule 3  . Insulin Glargine (LANTUS SOLOSTAR) 100 UNIT/ML Solostar Pen 45 units in the morning 15 mL  3  . Insulin Isophane & Regular Human (HUMULIN 70/30 MIX) (70-30) 100 UNIT/ML PEN Inject 25 Units into the skin every evening.     . Insulin Pen Needle (B-D ULTRAFINE III SHORT PEN) 31G X 8 MM MISC Use twice daily for insulin 180 each 3  . loratadine (CLARITIN) 10 MG tablet Take 10 mg by mouth daily.    . metoprolol tartrate (LOPRESSOR) 50 MG tablet TAKE 1 TABLET BY MOUTH TWO  TIMES DAILY 180 tablet 1  .  nystatin-triamcinolone (MYCOLOG II) cream Apply topically 4 (four) times daily. 60 g 0  . Probiotic Product (PROBIOTIC-10) CHEW Chew 1 each by mouth daily.     . tamsulosin (FLOMAX) 0.4 MG CAPS capsule TAKE 1 CAPSULE BY MOUTH  DAILY FOR PROSTATE 90 capsule 1  . trolamine salicylate (ASPERCREME) 10 % cream Apply 1 application topically as needed for muscle pain.     No current facility-administered medications for this visit.     PAST MEDICAL HISTORY: Past Medical History:  Diagnosis Date  . Allergic rhinitis, cause unspecified   . Carpal tunnel syndrome   . Cervical spondylosis without myelopathy   . Cervical spondylosis without myelopathy   . Cervicalgia   . Diverticulosis of colon (without mention of hemorrhage)   . Esophageal reflux   . Hypertrophy of prostate with urinary obstruction and other lower urinary tract symptoms (LUTS)   . Insomnia, unspecified   . Internal hemorrhoids without mention of complication   . Irritable bowel syndrome   . Macular degeneration (senile) of retina, unspecified   . Malignant neoplasm of prostate ([place])   . Neurogenic bladder, NOS   . Neuropathy   . Nonspecific (abnormal) findings on radiological and other examination of gastrointestinal tract   . Orthostatic hypotension   . Osteoarthrosis, unspecified whether generalized or localized, unspecified site   . Other and unspecified hyperlipidemia   . Other malaise and fatigue   . Other specified disorder of rectum and anus    Radiation Proctitus  . Paroxysmal supraventricular tachycardia ([place])   . Restless legs syndrome (RLS)   . Retinal detachment with retinal defect, unspecified   . Right bundle branch block    Incomplete  . Spermatocele    Right  . Trigger finger (acquired)   . Type II or unspecified type diabetes mellitus without mention of complication, not stated as uncontrolled   . Type II or unspecified type diabetes mellitus without mention of complication, uncontrolled   .  Unspecified essential hypertension    patient denies, states he has a "fast heart rate, but no high BP"  . Vitamin D deficiency     PAST SURGICAL HISTORY: Past Surgical History:  Procedure Laterality Date  . Angiolipoma  1980   Right arm  . APPENDECTOMY  1951  . CHOLECYSTECTOMY  01/1994  . COLONOSCOPY  04/11/2010  . COLONOSCOPY W/ POLYPECTOMY  2005   Removed 2 (two) polyps  . DUPUYTREN CONTRACTURE RELEASE Bilateral 1989  . EYE SURGERY  2006   Left  . EYE SURGERY  2006   Retina reattachment, right  . EYE SURGERY  02/1994   Cataract surgery, right  . EYE SURGERY  01/1995   Cataract surgery, left  . KNEE SURGERY  1979  . PARATHYROIDECTOMY Left 12/27/2016   Procedure: LEFT INFERIOR PARATHYROIDECTOMY;  Surgeon: Armandina Gemma, MD;  Location: [facility];  Service: General;  Laterality: Left;  . POLYPECTOMY  1955   Vocal cord  . RETINAL DETACHMENT SURGERY Right 2005  . SHOULDER  ARTHROSCOPY Right 03/22/15   [facility]  . TENDON RELEASE  01/1997   seven fingers  . TRANSURETHRAL RESECTION OF PROSTATE  08/1989  . VITRECTOMY Left 04/23/2013   [facility]    FAMILY HISTORY: Family History  Problem Relation Age of Onset  . Depression Mother   . Diabetes Mother   . Mental illness Mother        OCD  . Cerebral aneurysm Father        age 34  . Hypertension Sister   . Diabetes Daughter   . Hyperlipidemia Daughter   . Hyperparathyroidism Neg Hx     SOCIAL HISTORY: Social History   Socioeconomic History  . Marital status: Married    Spouse name: Not on file  . Number of children: 4  . Years of education: 10th  . Highest education level: Not on file  Occupational History  . Occupation: Retired  Scientific laboratory technician  . Financial resource strain: Not hard at all  . Food insecurity:    Worry: Never true    Inability: Never true  . Transportation needs:    Medical: No    Non-medical: No  Tobacco Use  . Smoking status: Never Smoker  . Smokeless tobacco: Never Used  Substance and Sexual  Activity  . Alcohol use: No    Alcohol/week: 0.0 standard drinks  . Drug use: No  . Sexual activity: Never  Lifestyle  . Physical activity:    Days per week: 7 days    Minutes per session: 30 min  . Stress: Not at all  Relationships  . Social connections:    Talks on phone: More than three times a week    Gets together: More than three times a week    Attends religious service: More than 4 times per year    Active member of club or organization: No    Attends meetings of clubs or organizations: Never    Relationship status: Married  . Intimate partner violence:    Fear of current or ex partner: No    Emotionally abused: No    Physically abused: No    Forced sexual activity: No  Other Topics Concern  . Not on file  Social History Narrative   Married   Never smoked   Alcohol none   Exercise walks dog twice a day   Living will   Left-handed   2 cups caffeine per day        PHYSICAL EXAM   There were no vitals filed for this visit.  Not recorded      There is no height or weight on file to calculate BMI.  PHYSICAL EXAMNIATION:  Gen: NAD, conversant, well nourised, obese, well groomed                     Cardiovascular: Regular rate rhythm, no peripheral edema, warm, nontender. Eyes: Conjunctivae clear without exudates or hemorrhage Neck: Supple, no carotid bruits. Pulmonary: Clear to auscultation bilaterally   NEUROLOGICAL EXAM:  MENTAL STATUS: Speech:    Speech is normal; fluent and spontaneous with normal comprehension.  Cognition:     Orientation to time, place and person     Normal recent and remote memory     Normal Attention span and concentration     Normal Language, naming, repeating,spontaneous speech     Fund of knowledge   CRANIAL NERVES: CN II: Pupils were small, sluggish reactive, preserved peripheral visual field, finger counting only, very poor central vision CN  III, IV, VI: extraocular movement are normal. No ptosis. CN V: Facial  sensation is intact to pinprick in all 3 divisions bilaterally. Corneal responses are intact.  CN VII: Face is symmetric with normal eye closure and smile. CN VIII: Hearing is normal to rubbing fingers CN IX, X: Palate elevates symmetrically. Phonation is normal. CN XI: Difficulty turning his neck, especially to the left side CN XII: Tongue is midline with normal movements and no atrophy.  MOTOR: Limited range of motion of left wrist, there is no significant bilateral upper extremity proximal distal muscle weakness, there is no significant bilateral lower extremity muscle weakness, complains of bilateral knee pain,  REFLEXES: Reflexes are hypoactive and symmetric at the biceps, triceps, knees, and ankles. Plantar responses are flexor.  SENSORY: Decreased pinprick and, light touch at bilateral finger pads.  COORDINATION: Rapid alternating movements and fine finger movements are intact. There is no dysmetria on finger-to-nose and heel-knee-shin.    GAIT/STANCE: He needs pushed up to get up from seated position, wide-based, unsteady, cautious gait Romberg is absent.   DIAGNOSTIC DATA (LABS, IMAGING, TESTING) - I reviewed patient records, labs, notes, testing and imaging myself where available.   ASSESSMENT AND PLAN  Micheal [name] is a 84 y.o. male   Bilateral upper extremity paresthesia  EMG nerve conduction study confirmed severe left carpal tunnel syndromes, mild left ulnar neuropathy across the wrist, with superimposed chronic left cervical radiculopathy, no evidence of active process.  Continue titrating up gabapentin 100 mg to 300 mg every night to help him sleep,  Wrist splint, neck stretching exercise, heating pad likely help,   Marcial Pacas, M.D. Ph.D.  [facility] [address], [place], [place] [address] Ph: [phone] Fax: [phone]  CC:  Gayland Curry, DO

## 2017-11-13 NOTE — Procedures (Signed)
Full Name: Micheal [name] Gender: Male MRN #: [id number] Date of Birth: Apr 12, 2028    Visit Date: 11/13/17 10:50 Age: 84 Years 2 Months Old Examining Physician: Marcial Pacas, MD  Referring Physician: Krista Blue, MD History: 84 year old male with history of chronic neck pain, radiating pain to left shoulder, arm.   Summary of the tests:  Nerve conduction study:  Left median and ulnar sensory responses showed moderate to mildly prolonged peak latency, with moderate to severely decreased to snap amplitude.  Left median motor responses showed severely prolonged distal latency, moderately decreased the C map amplitude.  Left ulnar motor responses showed 20% amplitude drop across left elbow, with 14 m/s velocity drop.  Right sural sensory responses were normal.  Right superficial peroneal sensory responses showed upper limit of peak latency, with mildly decreased snap amplitude.  Right peroneal to EDB and tibial motor responses are normal.  Electromyography: Selective needle examinations were performed at left upper extremity muscles and left cervical paraspinal muscles.  There is evidence of chronic neuropathic changes involving left abductor pollicis brevis; there is also evidence of mild chronic neuropathic changes involving left abductor digiti minimum, biceps, pronator teres.  Conclusion: This is an abnormal study.  There is electrodiagnostic evidence of severe left carpal tunnel syndrome, mild left ulnar neuropathy across left elbow, mild chronic left cervical radiculopathy, there is no evidence of active process.    ------------------------------- Marcial Pacas, M.D. Ph.D.  [facility] [place], [place] [address] Tel: [phone] Fax: [phone]        [facility]    Nerve / Sites Muscle Latency Ref. Amplitude Ref. Rel Amp Segments Distance Velocity Ref. Area    ms ms mV mV %  cm m/s m/s mVms  L Median - APB     Wrist APB 6.9 ?4.4 2.2 ?4.0 100 Wrist  - APB 7   7.8     Upper arm APB 11.3  2.3  105 Upper arm - Wrist 22 50 ?49 8.6  L Ulnar - ADM     Wrist ADM 3.1 ?3.3 9.0 ?6.0 100 Wrist - ADM 7   26.3     B.Elbow ADM 6.5  8.2  91.6 B.Elbow - Wrist 20 59 ?49 25.6     A.Elbow ADM 8.8  7.2  87.7 A.Elbow - B.Elbow 10 45 ?49 24.4         A.Elbow - Wrist      R Peroneal - EDB     Ankle EDB 5.8 ?6.5 2.5 ?2.0 100 Ankle - EDB 9   10.2     Fib head EDB 12.4  2.1  87.2 Fib head - Ankle 30 46 ?44 11.0     Pop fossa EDB 15.1  2.1  96.2 Pop fossa - Fib head 12 44 ?44 10.7         Pop fossa - Ankle      R Tibial - AH     Ankle AH 4.3 ?5.8 16.1 ?4.0 100 Ankle - AH 9   33.1     Pop fossa AH 13.4  9.8  61 Pop fossa - Ankle 37 41 ?41 27.1             SNC    Nerve / Sites Rec. Site Peak Lat Ref.  Amp Ref. Segments Distance    ms ms V V  cm  L Radial - Anatomical snuff box (Forearm)     Forearm Wrist 2.9 ?2.9 13 ?15  Forearm - Wrist 10  R Sural - Ankle (Calf)     Calf Ankle 4.0 ?4.4 6 ?6 Calf - Ankle 14  R Superficial peroneal - Ankle     Lat leg Ankle 4.4 ?4.4 4 ?6 Lat leg - Ankle 14  L Median - Orthodromic (Dig II, Mid palm)     Dig II Wrist 5.5 ?3.4 4 ?10 Dig II - Wrist 13  L Ulnar - Orthodromic, (Dig V, Mid palm)     Dig V Wrist 3.6 ?3.1 2 ?5 Dig V - Wrist 13               F  Wave    Nerve F Lat Ref.   ms ms  R Tibial - AH 56.0 ?56.0  L Ulnar - ADM 29.6 ?32.0         EMG full       EMG Summary Table    Spontaneous MUAP Recruitment  Muscle IA Fib PSW Fasc Other Amp Dur. Poly Pattern  L. First dorsal interosseous Normal None None None _______ Normal Normal Normal Normal  L. Abductor pollicis brevis Normal None None None _______ Increased Increased Normal Reduced  L. Abductor digiti minimi (manus) Normal None None None _______ Normal Normal Normal Reduced  L. Pronator teres Normal None None None _______ Normal Normal Normal  Reduced   L. Brachioradialis Normal None None None _______ Normal Normal Normal Normal  L. Biceps brachii Normal  None None None _______ Normal Normal Normal Reduced  L. Deltoid Normal None None None _______ Normal Normal Normal Normal  L. Cervical paraspinals Normal None None None _______ Normal Normal Normal Normal

## 2017-11-15 ENCOUNTER — Other Ambulatory Visit: Payer: Self-pay | Admitting: Internal Medicine

## 2017-11-15 DIAGNOSIS — E1165 Type 2 diabetes mellitus with hyperglycemia: Principal | ICD-10-CM

## 2017-11-15 DIAGNOSIS — E1140 Type 2 diabetes mellitus with diabetic neuropathy, unspecified: Secondary | ICD-10-CM

## 2017-11-15 DIAGNOSIS — IMO0002 Reserved for concepts with insufficient information to code with codable children: Secondary | ICD-10-CM

## 2017-12-03 DIAGNOSIS — M1712 Unilateral primary osteoarthritis, left knee: Secondary | ICD-10-CM | POA: Insufficient documentation

## 2017-12-04 DIAGNOSIS — M1712 Unilateral primary osteoarthritis, left knee: Secondary | ICD-10-CM | POA: Diagnosis not present

## 2017-12-22 ENCOUNTER — Other Ambulatory Visit: Payer: Self-pay | Admitting: Internal Medicine

## 2017-12-25 ENCOUNTER — Other Ambulatory Visit: Payer: Medicare Other

## 2017-12-25 DIAGNOSIS — IMO0002 Reserved for concepts with insufficient information to code with codable children: Secondary | ICD-10-CM

## 2017-12-25 DIAGNOSIS — E1140 Type 2 diabetes mellitus with diabetic neuropathy, unspecified: Secondary | ICD-10-CM | POA: Diagnosis not present

## 2017-12-25 DIAGNOSIS — E1165 Type 2 diabetes mellitus with hyperglycemia: Principal | ICD-10-CM

## 2017-12-25 DIAGNOSIS — C61 Malignant neoplasm of prostate: Secondary | ICD-10-CM | POA: Diagnosis not present

## 2017-12-26 LAB — CBC WITH DIFFERENTIAL/PLATELET
Basophils Absolute: 26 cells/uL (ref 0–200)
Basophils Relative: 0.3 %
Eosinophils Absolute: 123 cells/uL (ref 15–500)
Eosinophils Relative: 1.4 %
HCT: 39.6 % (ref 38.5–50.0)
Hemoglobin: 13.7 g/dL (ref 13.2–17.1)
Lymphs Abs: 1250 cells/uL (ref 850–3900)
MCH: 29.8 pg (ref 27.0–33.0)
MCHC: 34.6 g/dL (ref 32.0–36.0)
MCV: 86.3 fL (ref 80.0–100.0)
MPV: 11.2 fL (ref 7.5–12.5)
Monocytes Relative: 8.4 %
Neutro Abs: 6662 cells/uL (ref 1500–7800)
Neutrophils Relative %: 75.7 %
Platelets: 228 10*3/uL (ref 140–400)
RBC: 4.59 10*6/uL (ref 4.20–5.80)
RDW: 12.5 % (ref 11.0–15.0)
Total Lymphocyte: 14.2 %
WBC mixed population: 739 cells/uL (ref 200–950)
WBC: 8.8 10*3/uL (ref 3.8–10.8)

## 2017-12-26 LAB — HEMOGLOBIN A1C
Hgb A1c MFr Bld: 7.8 % of total Hgb — ABNORMAL HIGH (ref <5.7)
Mean Plasma Glucose: 177 (calc)
eAG (mmol/L): 9.8 (calc)

## 2017-12-26 LAB — BASIC METABOLIC PANEL
BUN: 19 mg/dL (ref 7–25)
CO2: 29 mmol/L (ref 20–32)
Calcium: 9.6 mg/dL (ref 8.6–10.3)
Chloride: 107 mmol/L (ref 98–110)
Creat: 0.98 mg/dL (ref 0.70–1.11)
Glucose, Bld: 48 mg/dL — ABNORMAL LOW (ref 65–99)
Potassium: 4.4 mmol/L (ref 3.5–5.3)
Sodium: 142 mmol/L (ref 135–146)

## 2017-12-30 ENCOUNTER — Ambulatory Visit (INDEPENDENT_AMBULATORY_CARE_PROVIDER_SITE_OTHER): Payer: Medicare Other | Admitting: Internal Medicine

## 2017-12-30 ENCOUNTER — Encounter: Payer: Self-pay | Admitting: Internal Medicine

## 2017-12-30 VITALS — BP 130/80 | HR 70 | Temp 97.6°F | Ht 66.0 in | Wt 157.0 lb

## 2017-12-30 DIAGNOSIS — C61 Malignant neoplasm of prostate: Secondary | ICD-10-CM

## 2017-12-30 DIAGNOSIS — Z23 Encounter for immunization: Secondary | ICD-10-CM

## 2017-12-30 DIAGNOSIS — E11641 Type 2 diabetes mellitus with hypoglycemia with coma: Secondary | ICD-10-CM | POA: Diagnosis not present

## 2017-12-30 DIAGNOSIS — G5602 Carpal tunnel syndrome, left upper limb: Secondary | ICD-10-CM | POA: Diagnosis not present

## 2017-12-30 DIAGNOSIS — M159 Polyosteoarthritis, unspecified: Secondary | ICD-10-CM

## 2017-12-30 DIAGNOSIS — H353232 Exudative age-related macular degeneration, bilateral, with inactive choroidal neovascularization: Secondary | ICD-10-CM

## 2017-12-30 DIAGNOSIS — Z794 Long term (current) use of insulin: Secondary | ICD-10-CM | POA: Diagnosis not present

## 2017-12-30 DIAGNOSIS — M5412 Radiculopathy, cervical region: Secondary | ICD-10-CM

## 2017-12-30 MED ORDER — ZOSTER VAC RECOMB ADJUVANTED 50 MCG/0.5ML IM SUSR: 0.5000 mL | Freq: Once | INTRAMUSCULAR | 1 refills | Status: AC

## 2017-12-30 NOTE — Patient Instructions (Signed)
Decrease your evening humulin 70/30 insulin to 22 units (from 25 units) at night.   Keep your lantus the same.   Continue a small snack with your gabapentin at bedtime.

## 2017-12-30 NOTE — Progress Notes (Signed)
Location:  [facility] Provider:  Gari Hartsell L. Mariea Clonts, D.O., C.M.D.  Code Status: DNR Goals of Care:  Advanced Directives 05/20/2017  Does Patient Have a Medical Advance Directive? Yes  Type of Advance Directive Living will  Does patient want to make changes to medical advance directive? No - Patient declined  Copy of [place] in Chart? -  Would patient like information on creating a medical advance directive? -     Chief Complaint  Patient presents with  . Medical Management of Chronic Issues    78mth follow-up    HPI: Patient is a 84 y.o. male seen today for medical management of chronic diseases.    His wife reports that his sugars have been low at times and I had noticed that his fasting glucose was 48 at his lab draw.  His hba1c was much improved to 7.8 from 8.4.    His pain from arthritis is so bad.  His left knee hurts as bad as the right.  The more he thinks about the knee replacement, the more he worries.    He went to the neurologist, Dr. Krista Blue.  He takes gabapentin 100mg  three at night.  It does help him sleep at night.    His hands, wrists, neck, legs all hurt.   Dr. Krista Blue felt like a lot of his symptoms were from his spine.  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  He had to turn knobs a lot and bump with his palm on some buttons.  Walks his little dog 1.5 miles to 2 miles a day.  He hurts but says it's better to use it than to sit a while.  His knees and legs won't pick him up.  We tried him on mobic and celebrex but he did not tolerate them.  He's on aspercreme and tylenol max dose.  He does use a cane when he walks a lot.    Says he's gone down more in the last 1.5 years.  He'd been more active until his eyes really got worse.  No change at eye doctor in 2 years.    PSA stable for 10 years.  His had been localized to the prostate.  Took avodart for several years.  Has been clear since TURP.    Past Medical History:    Diagnosis Date  . Allergic rhinitis, cause unspecified   . Carpal tunnel syndrome   . Cervical spondylosis without myelopathy   . Cervical spondylosis without myelopathy   . Cervicalgia   . Diverticulosis of colon (without mention of hemorrhage)   . Esophageal reflux   . Hypertrophy of prostate with urinary obstruction and other lower urinary tract symptoms (LUTS)   . Insomnia, unspecified   . Internal hemorrhoids without mention of complication   . Irritable bowel syndrome   . Macular degeneration (senile) of retina, unspecified   . Malignant neoplasm of prostate ([place])   . Neurogenic bladder, NOS   . Neuropathy   . Nonspecific (abnormal) findings on radiological and other examination of gastrointestinal tract   . Orthostatic hypotension   . Osteoarthrosis, unspecified whether generalized or localized, unspecified site   . Other and unspecified hyperlipidemia   . Other malaise and fatigue   . Other specified disorder of rectum and anus    Radiation Proctitus  . Paroxysmal supraventricular tachycardia ([place])   . Restless legs syndrome (RLS)   . Retinal detachment with retinal defect, unspecified   .  Right bundle branch block    Incomplete  . Spermatocele    Right  . Trigger finger (acquired)   . Type II or unspecified type diabetes mellitus without mention of complication, not stated as uncontrolled   . Type II or unspecified type diabetes mellitus without mention of complication, uncontrolled   . Unspecified essential hypertension    patient denies, states he has a "fast heart rate, but no high BP"  . Vitamin D deficiency     Past Surgical History:  Procedure Laterality Date  . Angiolipoma  1980   Right arm  . APPENDECTOMY  1951  . CHOLECYSTECTOMY  01/1994  . COLONOSCOPY  04/11/2010  . COLONOSCOPY W/ POLYPECTOMY  2005   Removed 2 (two) polyps  . DUPUYTREN CONTRACTURE RELEASE Bilateral 1989  . EYE SURGERY  2006   Left  . EYE SURGERY  2006   Retina reattachment,  right  . EYE SURGERY  02/1994   Cataract surgery, right  . EYE SURGERY  01/1995   Cataract surgery, left  . KNEE SURGERY  1979  . PARATHYROIDECTOMY Left 12/27/2016   Procedure: LEFT INFERIOR PARATHYROIDECTOMY;  Surgeon: Armandina Gemma, MD;  Location: [facility];  Service: General;  Laterality: Left;  . POLYPECTOMY  1955   Vocal cord  . RETINAL DETACHMENT SURGERY Right 2005  . SHOULDER ARTHROSCOPY Right 03/22/15   [place]  . TENDON RELEASE  01/1997   seven fingers  . TRANSURETHRAL RESECTION OF PROSTATE  08/1989  . VITRECTOMY Left 04/23/2013   [facility]    Allergies  Allergen Reactions  . Aspirin Other (See Comments)    Bothers stomach, thins blood   . Atorvastatin Other (See Comments)    Muscular pain and weakness  . Prednisone Other (See Comments)    Upset stomach, can take by shot not by mouth  . Simvastatin Other (See Comments)    Muscular pain and weakness  . Sulfa Antibiotics Other (See Comments)    Upset stomach   . Latex Rash  . Metformin Hcl Nausea Only  . Voltaren [Diclofenac Sodium] Other (See Comments)    Unknown    Outpatient Encounter Medications as of 12/30/2017  Medication Sig  . acetaminophen (TYLENOL) 500 MG tablet Take 1,000 mg by mouth 3 (three) times daily.  Marland Kitchen alum & mag hydroxide-simeth (MAALOX/MYLANTA) 200-200-20 MG/5ML suspension Take 30 mLs by mouth as needed for indigestion or heartburn.  . cholecalciferol (VITAMIN D) 400 units TABS tablet Take 400 Units by mouth daily.  Marland Kitchen docusate sodium (COLACE) 100 MG capsule Take 100 mg by mouth daily.  . famotidine (PEPCID AC) 10 MG chewable tablet Chew 10 mg by mouth daily as needed for heartburn.  . gabapentin (NEURONTIN) 100 MG capsule One to 3 tabs every night.  . Insulin Glargine (LANTUS SOLOSTAR) 100 UNIT/ML Solostar Pen INJECT 45 UNITS  SUBCUTANEOUSLY IN THE  MORNING  . Insulin Isophane & Regular Human (HUMULIN 70/30 MIX) (70-30) 100 UNIT/ML PEN Inject 25 Units into the skin every evening.   . Insulin Pen  Needle (B-D ULTRAFINE III SHORT PEN) 31G X 8 MM MISC Use twice daily for insulin  . loratadine (CLARITIN) 10 MG tablet Take 10 mg by mouth daily.  . metoprolol tartrate (LOPRESSOR) 50 MG tablet TAKE 1 TABLET BY MOUTH TWO  TIMES DAILY  . nystatin-triamcinolone (MYCOLOG II) cream Apply topically 4 (four) times daily.  . Probiotic Product (PROBIOTIC-10) CHEW Chew 1 each by mouth daily.   . tamsulosin (FLOMAX) 0.4 MG CAPS capsule TAKE 1  CAPSULE BY MOUTH  DAILY FOR PROSTATE  . trolamine salicylate (ASPERCREME) 10 % cream Apply 1 application topically as needed for muscle pain.  . [DISCONTINUED] HUMULIN 70/30 KWIKPEN (70-30) 100 UNIT/ML PEN INJECT 20 UNITS EVERY  MORNING   No facility-administered encounter medications on file as of 12/30/2017.     Review of Systems:  Review of Systems  Constitutional: Negative for chills, fever and malaise/fatigue.  HENT: Positive for hearing loss.   Eyes: Positive for blurred vision.  Respiratory: Negative for cough and shortness of breath.   Cardiovascular: Negative for chest pain, palpitations and leg swelling.  Gastrointestinal: Negative for abdominal pain, constipation, diarrhea, nausea and vomiting.  Genitourinary: Positive for frequency and urgency. Negative for dysuria.       Leakage  Musculoskeletal: Positive for joint pain. Negative for back pain, falls, myalgias and neck pain.  Skin: Negative for itching and rash.  Neurological: Positive for tingling and sensory change. Negative for dizziness and weakness.  Endo/Heme/Allergies: Bruises/bleeds easily.  Psychiatric/Behavioral: Positive for memory loss. Negative for depression. The patient is not nervous/anxious.        Mild memory loss per wife    Health Maintenance  Topic Date Due  . FOOT EXAM  08/29/2016  . OPHTHALMOLOGY EXAM  08/30/2016  . URINE MICROALBUMIN  06/05/2017  . INFLUENZA VACCINE  08/22/2017  . HEMOGLOBIN A1C  06/26/2018  . TETANUS/TDAP  08/22/2020  . PNA vac Low Risk Adult   Completed    Physical Exam: Vitals:   12/30/17 0921  BP: 130/80  Pulse: 70  Temp: 97.6 F (36.4 C)  TempSrc: Oral  SpO2: 92%  Weight: 157 lb (71.2 kg)  Height: 5\' 6"  (1.676 m)   Body mass index is 25.34 kg/m. Physical Exam Constitutional:      Appearance: Normal appearance.  HENT:     Head: Normocephalic and atraumatic.  Neck:     Musculoskeletal: Normal range of motion and neck supple.  Cardiovascular:     Rate and Rhythm: Normal rate and regular rhythm.     Pulses: Normal pulses.     Heart sounds: Normal heart sounds.  Pulmonary:     Effort: Pulmonary effort is normal.     Breath sounds: Normal breath sounds.  Abdominal:     Palpations: Abdomen is soft.  Musculoskeletal:        General: Tenderness present. No swelling.     Right lower leg: No edema.     Left lower leg: No edema.     Comments: Of bilateral knees  Skin:    General: Skin is warm and dry.  Neurological:     General: No focal deficit present.     Mental Status: He is alert and oriented to person, place, and time.  Psychiatric:        Mood and Affect: Mood normal.        Behavior: Behavior normal.        Thought Content: Thought content normal.        Judgment: Judgment normal.     Labs reviewed: Basic Metabolic Panel: Recent Labs    05/06/17 1038 08/15/17 0820 12/25/17 0826  NA 140 141 142  K 4.8 4.1 4.4  CL 109 109 107  CO2 22 26 29   GLUCOSE 159* 92 48*  BUN 11 14 19   CREATININE 0.92 0.89 0.98  CALCIUM 9.6 9.5 9.6   Liver Function Tests: Recent Labs    01/21/17 0846 03/25/17 1017 08/15/17 0820  AST 14 15 15   ALT  12 11 12   BILITOT 0.6 0.5 0.5  PROT 6.0* 6.4 6.2   No results for input(s): LIPASE, AMYLASE in the last 8760 hours. No results for input(s): AMMONIA in the last 8760 hours. CBC: Recent Labs    03/25/17 1017 05/06/17 1038 08/15/17 0820 12/25/17 0826  WBC 6.1 5.7 4.9 8.8  NEUTROABS 3,806  --  3,038 6,662  HGB 13.6 13.3 13.2 13.7  HCT 38.6 40.3 37.9* 39.6   MCV 85.8 90.0 87.5 86.3  PLT 253 226 200 228   Lipid Panel: Recent Labs    03/25/17 1017 08/15/17 0820  CHOL 232* 251*  HDL 39* 38*  LDLCALC 162* 171*  TRIG 162* 256*  CHOLHDL 5.9* 6.6*   Lab Results  Component Value Date   HGBA1C 7.8 (H) 12/25/2017    Procedures since last visit: No results found.  Assessment/Plan 1. Chronic cervical radiculopathy -with sensory loss in fingers and down arms at times  2. Carpal tunnel syndrome on left -also noted when he saw Dr. Krista Blue  3. Generalized osteoarthritis of multiple sites -continue to stay active, use tylenol, topicals, and I ordered for OT to come out to the house more for his wife's balance but also b/c he's having challenges on stairs as well so he might also benefit from PT again -he's really hesitant to have knee surgery even if they would do it -hba1c has come down, but still not under 7 which is unrealistic for him it seems without inducing hypoglycemia and falls  4. Exudative age-related macular degeneration of both eyes with inactive choroidal neovascularization ([place] [address]) -following with retina specialist for injections  5. Type 2 diabetes mellitus with hypoglycemia and coma, with long-term current use of insulin ([facility]) - control has improved, but he's now had some lows already so will decrease his insulin slightly to address this - Microalbumin / creatinine urine ratio  6. Malignant neoplasm of prostate ([place]) -with long term side effect of urinary leakage   Labs/tests ordered:   Orders Placed This Encounter  Procedures  . Microalbumin / creatinine urine ratio   Next appt:  03/07/2018   Semaj Kham L. Akiva Josey, D.O. [facility] [address], [place] [address] Cell Phone (Mon-Fri 8am-5pm):  [phone] On Call:  [phone] & follow prompts after 5pm & weekends Office Phone:  [phone] Office Fax:  [phone]

## 2017-12-31 LAB — MICROALBUMIN / CREATININE URINE RATIO
Creatinine, Urine: 31 mg/dL (ref 20–320)
Microalb Creat Ratio: 23 mcg/mg creat (ref <30)
Microalb, Ur: 0.7 mg/dL

## 2018-01-01 DIAGNOSIS — R102 Pelvic and perineal pain: Secondary | ICD-10-CM | POA: Diagnosis not present

## 2018-01-01 DIAGNOSIS — C61 Malignant neoplasm of prostate: Secondary | ICD-10-CM | POA: Diagnosis not present

## 2018-01-01 DIAGNOSIS — N3941 Urge incontinence: Secondary | ICD-10-CM | POA: Diagnosis not present

## 2018-01-03 ENCOUNTER — Encounter: Payer: Self-pay | Admitting: *Deleted

## 2018-02-03 ENCOUNTER — Other Ambulatory Visit: Payer: Self-pay | Admitting: *Deleted

## 2018-02-03 DIAGNOSIS — E1151 Type 2 diabetes mellitus with diabetic peripheral angiopathy without gangrene: Secondary | ICD-10-CM

## 2018-02-03 MED ORDER — INSULIN PEN NEEDLE 31G X 8 MM MISC: 3 refills | Status: DC

## 2018-02-03 NOTE — Telephone Encounter (Signed)
[facility] 

## 2018-03-05 ENCOUNTER — Telehealth: Payer: Self-pay | Admitting: *Deleted

## 2018-03-05 NOTE — Telephone Encounter (Signed)
Received fax from [place] [facility] [phone] Althea Charon [phone] Fax: [phone] for Therapeutic Shoes and Inserts.  Called and verify with patient. He does want this product. Verbal from patient to send records.   Placed in Dr. Cyndi Lennert folder to fill out and sign.

## 2018-03-07 ENCOUNTER — Encounter: Payer: Medicare Other | Admitting: Family

## 2018-03-07 ENCOUNTER — Ambulatory Visit (INDEPENDENT_AMBULATORY_CARE_PROVIDER_SITE_OTHER): Payer: Medicare Other | Admitting: Family

## 2018-03-07 ENCOUNTER — Ambulatory Visit: Payer: Self-pay

## 2018-03-07 ENCOUNTER — Encounter: Payer: Self-pay | Admitting: Family

## 2018-03-07 VITALS — BP 122/70 | HR 73 | Temp 97.4°F | Resp 18 | Ht 67.0 in | Wt 163.0 lb

## 2018-03-07 DIAGNOSIS — Z0000 Encounter for general adult medical examination without abnormal findings: Secondary | ICD-10-CM

## 2018-03-07 NOTE — Patient Instructions (Signed)
Micheal [name] , Thank you for taking time to come for your Medicare Wellness Visit. I appreciate your ongoing commitment to your health goals. Please review the following plan we discussed and let me know if I can assist you in the future.   Screening recommendations/referrals: Colonoscopy: Up to date  Recommended yearly ophthalmology/optometry visit for glaucoma screening and checkup Recommended yearly dental visit for hygiene and checkup  Vaccinations: Influenza vaccine: Up to date  Pneumococcal vaccine: Up to date  Tdap vaccine: Due 08/22/2020 Shingles vaccine: Declined    Advanced directives: Copy in chart   Conditions/risks identified: Gender,Age,Hypertension,Hyperlidemia,Type 2 DM  Next appointment:1 year   Preventive Care 53 Years and Older, Male Preventive care refers to lifestyle choices and visits with your health care provider that can promote health and wellness. What does preventive care include?  A yearly physical exam. This is also called an annual well check.  Dental exams once or twice a year.  Routine eye exams. Ask your health care provider how often you should have your eyes checked.  Personal lifestyle choices, including:  Daily care of your teeth and gums.  Regular physical activity.  Eating a healthy diet.  Avoiding tobacco and drug use.  Limiting alcohol use.  Practicing safe sex.  Taking low doses of aspirin every day.  Taking vitamin and mineral supplements as recommended by your health care provider. What happens during an annual well check? The services and screenings done by your health care provider during your annual well check will depend on your age, overall health, lifestyle risk factors, and family history of disease. Counseling  Your health care provider may ask you questions about your:  Alcohol use.  Tobacco use.  Drug use.  Emotional well-being.  Home and relationship well-being.  Sexual activity.  Eating  habits.  History of falls.  Memory and ability to understand (cognition).  Work and work Statistician. Screening  You may have the following tests or measurements:  Height, weight, and BMI.  Blood pressure.  Lipid and cholesterol levels. These may be checked every 5 years, or more frequently if you are over 70 years old.  Skin check.  Lung cancer screening. You may have this screening every year starting at age 87 if you have a 30-pack-year history of smoking and currently smoke or have quit within the past 15 years.  Fecal occult blood test (FOBT) of the stool. You may have this test every year starting at age 83.  Flexible sigmoidoscopy or colonoscopy. You may have a sigmoidoscopy every 5 years or a colonoscopy every 10 years starting at age 67.  Prostate cancer screening. Recommendations will vary depending on your family history and other risks.  Hepatitis C blood test.  Hepatitis B blood test.  Sexually transmitted disease (STD) testing.  Diabetes screening. This is done by checking your blood sugar (glucose) after you have not eaten for a while (fasting). You may have this done every 1-3 years.  Abdominal aortic aneurysm (AAA) screening. You may need this if you are a current or former smoker.  Osteoporosis. You may be screened starting at age 60 if you are at high risk. Talk with your health care provider about your test results, treatment options, and if necessary, the need for more tests. Vaccines  Your health care provider may recommend certain vaccines, such as:  Influenza vaccine. This is recommended every year.  Tetanus, diphtheria, and acellular pertussis (Tdap, Td) vaccine. You may need a Td booster every 10 years.  Zoster  vaccine. You may need this after age 30.  Pneumococcal 13-valent conjugate (PCV13) vaccine. One dose is recommended after age 40.  Pneumococcal polysaccharide (PPSV23) vaccine. One dose is recommended after age 72. Talk to your health  care provider about which screenings and vaccines you need and how often you need them. This information is not intended to replace advice given to you by your health care provider. Make sure you discuss any questions you have with your health care provider. Document Released: 02/04/2015 Document Revised: 09/28/2015 Document Reviewed: 11/09/2014 Elsevier Interactive Patient Education  2017 [place] Prevention in the Home Falls can cause injuries. They can happen to people of all ages. There are many things you can do to make your home safe and to help prevent falls. What can I do on the outside of my home?  Regularly fix the edges of walkways and driveways and fix any cracks.  Remove anything that might make you trip as you walk through a door, such as a raised step or threshold.  Trim any bushes or trees on the path to your home.  Use bright outdoor lighting.  Clear any walking paths of anything that might make someone trip, such as rocks or tools.  Regularly check to see if handrails are loose or broken. Make sure that both sides of any steps have handrails.  Any raised decks and porches should have guardrails on the edges.  Have any leaves, snow, or ice cleared regularly.  Use sand or salt on walking paths during winter.  Clean up any spills in your garage right away. This includes oil or grease spills. What can I do in the bathroom?  Use night lights.  Install grab bars by the toilet and in the tub and shower. Do not use towel bars as grab bars.  Use non-skid mats or decals in the tub or shower.  If you need to sit down in the shower, use a plastic, non-slip stool.  Keep the floor dry. Clean up any water that spills on the floor as soon as it happens.  Remove soap buildup in the tub or shower regularly.  Attach bath mats securely with double-sided non-slip rug tape.  Do not have throw rugs and other things on the floor that can make you trip. What can I do  in the bedroom?  Use night lights.  Make sure that you have a light by your bed that is easy to reach.  Do not use any sheets or blankets that are too big for your bed. They should not hang down onto the floor.  Have a firm chair that has side arms. You can use this for support while you get dressed.  Do not have throw rugs and other things on the floor that can make you trip. What can I do in the kitchen?  Clean up any spills right away.  Avoid walking on wet floors.  Keep items that you use a lot in easy-to-reach places.  If you need to reach something above you, use a strong step stool that has a grab bar.  Keep electrical cords out of the way.  Do not use floor polish or wax that makes floors slippery. If you must use wax, use non-skid floor wax.  Do not have throw rugs and other things on the floor that can make you trip. What can I do with my stairs?  Do not leave any items on the stairs.  Make sure that there are handrails  on both sides of the stairs and use them. Fix handrails that are broken or loose. Make sure that handrails are as long as the stairways.  Check any carpeting to make sure that it is firmly attached to the stairs. Fix any carpet that is loose or worn.  Avoid having throw rugs at the top or bottom of the stairs. If you do have throw rugs, attach them to the floor with carpet tape.  Make sure that you have a light switch at the top of the stairs and the bottom of the stairs. If you do not have them, ask someone to add them for you. What else can I do to help prevent falls?  Wear shoes that:  Do not have high heels.  Have rubber bottoms.  Are comfortable and fit you well.  Are closed at the toe. Do not wear sandals.  If you use a stepladder:  Make sure that it is fully opened. Do not climb a closed stepladder.  Make sure that both sides of the stepladder are locked into place.  Ask someone to hold it for you, if possible.  Clearly mark  and make sure that you can see:  Any grab bars or handrails.  First and last steps.  Where the edge of each step is.  Use tools that help you move around (mobility aids) if they are needed. These include:  Canes.  Walkers.  Scooters.  Crutches.  Turn on the lights when you go into a dark area. Replace any light bulbs as soon as they burn out.  Set up your furniture so you have a clear path. Avoid moving your furniture around.  If any of your floors are uneven, fix them.  If there are any pets around you, be aware of where they are.  Review your medicines with your doctor. Some medicines can make you feel dizzy. This can increase your chance of falling. Ask your doctor what other things that you can do to help prevent falls. This information is not intended to replace advice given to you by your health care provider. Make sure you discuss any questions you have with your health care provider. Document Released: 11/04/2008 Document Revised: 06/16/2015 Document Reviewed: 02/12/2014 Elsevier Interactive Patient Education  2017 [employer].

## 2018-03-07 NOTE — Progress Notes (Signed)
Subjective:   Micheal [name] is a 85 y.o. male who presents for Medicare Annual/Subsequent preventive examination.  Review of Systems:   Cardiac Risk Factors include: advanced age (>43men, >66 women);diabetes mellitus;dyslipidemia;male gender;hypertension     Objective:    Vitals: BP 122/70   Pulse 73   Temp (!) 97.4 F (36.3 C) (Oral)   Resp 18   Ht 5\' 7"  (1.702 m)   Wt 163 lb (73.9 kg)   SpO2 96%   BMI 25.53 kg/m   Body mass index is 25.53 kg/m.  Advanced Directives 03/07/2018 05/20/2017 05/06/2017 03/25/2017 03/04/2017 01/24/2017 12/25/2016  Does Patient Have a Medical Advance Directive? Yes Yes Yes Yes Yes Yes Yes  Type of Advance Directive Living will Living will Living will;Healthcare Power of [place] will - Living will Living will  Does patient want to make changes to medical advance directive? - No - Patient declined No - Patient declined No - Patient declined No - Patient declined No - Patient declined No - Patient declined  Copy of [place] in Chart? - - No - copy requested - - - -  Would patient like information on creating a medical advance directive? - - - - - - -    Tobacco Social History   Tobacco Use  Smoking Status Never Smoker  Smokeless Tobacco Never Used     Counseling given: Not Answered   Clinical Intake:  Pre-visit preparation completed: No  Pain : 0-10 Pain Score: 8  Pain Type: Chronic pain Pain Location: Knee Pain Orientation: Right, Other (Comment)(bilateral but worst on right knee ) Pain Descriptors / Indicators: Constant, Aching Pain Onset: Other (comment)(several years ago) Pain Frequency: Constant Pain Relieving Factors: Tylenol,Gabapentin  Effect of Pain on Daily Activities: Hurts with walking but still walk.   Pain Relieving Factors: Tylenol,Gabapentin   BMI - recorded: 25.53 Nutritional Status: BMI 25 -29 Overweight Nutritional Risks: None Diabetes: Yes CBG done?: No Did pt. bring in CBG monitor  from home?: No(Recalls CBG 90's-200's )  How often do you need to have someone help you when you read instructions, pamphlets, or other written materials from your doctor or pharmacy?: 5 - Always(impaired vision.wife /daughter assist ) What is the last grade level you completed in school?: 8 grade   Interpreter Needed?: No  Information entered by :: Dinah Ngetich FNP-C   Past Medical History:  Diagnosis Date  . Allergic rhinitis, cause unspecified   . Carpal tunnel syndrome   . Cervical spondylosis without myelopathy   . Cervical spondylosis without myelopathy   . Cervicalgia   . Diverticulosis of colon (without mention of hemorrhage)   . Esophageal reflux   . Hypertrophy of prostate with urinary obstruction and other lower urinary tract symptoms (LUTS)   . Insomnia, unspecified   . Internal hemorrhoids without mention of complication   . Irritable bowel syndrome   . Macular degeneration (senile) of retina, unspecified   . Malignant neoplasm of prostate ([place])   . Neurogenic bladder, NOS   . Neuropathy   . Nonspecific (abnormal) findings on radiological and other examination of gastrointestinal tract   . Orthostatic hypotension   . Osteoarthrosis, unspecified whether generalized or localized, unspecified site   . Other and unspecified hyperlipidemia   . Other malaise and fatigue   . Other specified disorder of rectum and anus    Radiation Proctitus  . Paroxysmal supraventricular tachycardia ([place])   . Restless legs syndrome (RLS)   . Retinal detachment with retinal  defect, unspecified   . Right bundle branch block    Incomplete  . Spermatocele    Right  . Trigger finger (acquired)   . Type II or unspecified type diabetes mellitus without mention of complication, not stated as uncontrolled   . Type II or unspecified type diabetes mellitus without mention of complication, uncontrolled   . Unspecified essential hypertension    patient denies, states he has a "fast heart rate,  but no high BP"  . Vitamin D deficiency    Past Surgical History:  Procedure Laterality Date  . Angiolipoma  1980   Right arm  . APPENDECTOMY  1951  . CHOLECYSTECTOMY  01/1994  . COLONOSCOPY  04/11/2010  . COLONOSCOPY W/ POLYPECTOMY  2005   Removed 2 (two) polyps  . DUPUYTREN CONTRACTURE RELEASE Bilateral 1989  . EYE SURGERY  2006   Left  . EYE SURGERY  2006   Retina reattachment, right  . EYE SURGERY  02/1994   Cataract surgery, right  . EYE SURGERY  01/1995   Cataract surgery, left  . KNEE SURGERY  1979  . PARATHYROIDECTOMY Left 12/27/2016   Procedure: LEFT INFERIOR PARATHYROIDECTOMY;  Surgeon: Armandina Gemma, MD;  Location: [facility];  Service: General;  Laterality: Left;  . POLYPECTOMY  1955   Vocal cord  . RETINAL DETACHMENT SURGERY Right 2005  . SHOULDER ARTHROSCOPY Right 03/22/15   [place]  . TENDON RELEASE  01/1997   seven fingers  . TRANSURETHRAL RESECTION OF PROSTATE  08/1989  . VITRECTOMY Left 04/23/2013   [facility]   Family History  Problem Relation Age of Onset  . Depression Mother   . Diabetes Mother   . Mental illness Mother        OCD  . Cerebral aneurysm Father        age 58  . Hypertension Sister   . Diabetes Daughter   . Hyperlipidemia Daughter   . Hyperparathyroidism Neg Hx    Social History   Socioeconomic History  . Marital status: Married    Spouse name: Not on file  . Number of children: 4  . Years of education: 10th  . Highest education level: Not on file  Occupational History  . Occupation: Retired  Scientific laboratory technician  . Financial resource strain: Not hard at all  . Food insecurity:    Worry: Never true    Inability: Never true  . Transportation needs:    Medical: No    Non-medical: No  Tobacco Use  . Smoking status: Never Smoker  . Smokeless tobacco: Never Used  Substance and Sexual Activity  . Alcohol use: No    Alcohol/week: 0.0 standard drinks  . Drug use: No  . Sexual activity: Never  Lifestyle  . Physical activity:     Days per week: 7 days    Minutes per session: 30 min  . Stress: Not at all  Relationships  . Social connections:    Talks on phone: More than three times a week    Gets together: More than three times a week    Attends religious service: More than 4 times per year    Active member of club or organization: No    Attends meetings of clubs or organizations: Never    Relationship status: Married  Other Topics Concern  . Not on file  Social History Narrative   Married   Never smoked   Alcohol none   Exercise walks dog twice a day   Living will  Left-handed   2 cups caffeine per day       Outpatient Encounter Medications as of 03/07/2018  Medication Sig  . acetaminophen (TYLENOL) 500 MG tablet Take 1,000 mg by mouth 3 (three) times daily.  Marland Kitchen alum & mag hydroxide-simeth (MAALOX/MYLANTA) 200-200-20 MG/5ML suspension Take 30 mLs by mouth as needed for indigestion or heartburn.  . cholecalciferol (VITAMIN D) 400 units TABS tablet Take 400 Units by mouth daily.  Marland Kitchen docusate sodium (COLACE) 100 MG capsule Take 100 mg by mouth daily.  . famotidine (PEPCID AC) 10 MG chewable tablet Chew 10 mg by mouth daily as needed for heartburn.  . gabapentin (NEURONTIN) 100 MG capsule One to 3 tabs every night.  . Insulin Glargine (LANTUS SOLOSTAR) 100 UNIT/ML Solostar Pen INJECT 45 UNITS  SUBCUTANEOUSLY IN THE  MORNING  . Insulin Isophane & Regular Human (HUMULIN 70/30 MIX) (70-30) 100 UNIT/ML PEN Inject 22 Units into the skin every evening.  . Insulin Pen Needle (B-D ULTRAFINE III SHORT PEN) 31G X 8 MM MISC Use twice daily for insulin  . loratadine (CLARITIN) 10 MG tablet Take 10 mg by mouth daily.  . metoprolol tartrate (LOPRESSOR) 50 MG tablet TAKE 1 TABLET BY MOUTH TWO  TIMES DAILY  . nystatin-triamcinolone (MYCOLOG II) cream Apply topically 4 (four) times daily.  . Probiotic Product (PROBIOTIC-10) CHEW Chew 1 each by mouth daily.   . tamsulosin (FLOMAX) 0.4 MG CAPS capsule TAKE 1 CAPSULE BY MOUTH   DAILY FOR PROSTATE  . trolamine salicylate (ASPERCREME) 10 % cream Apply 1 application topically as needed for muscle pain.   No facility-administered encounter medications on file as of 03/07/2018.     Activities of Daily Living In your present state of health, do you have any difficulty performing the following activities: 03/07/2018 05/06/2017  Hearing? Tempie Donning  Comment wears hearing aids  -  Vision? Y Y  Comment see opthalmology every 6 months.  -  Difficulty concentrating or making decisions? N N  Walking or climbing stairs? Y N  Comment due to knee pain  -  Dressing or bathing? N N  Doing errands, shopping? Y Y  Comment Needs assist with driving  due to vision loss  Preparing Food and eating ? N -  Using the Toilet? N -  In the past six months, have you accidently leaked urine? N -  Do you have problems with loss of bowel control? N -  Managing your Medications? N -  Managing your Finances? N -  Housekeeping or managing your Housekeeping? N -  Some recent data might be hidden    Patient Care Team: Gayland Curry, DO as PCP - General (Geriatric Medicine) Netta Cedars, MD as Consulting Physician (Orthopedic Surgery) Jasmine Awe, MD as Referring Physician (Ophthalmology) Rana Snare, MD as Consulting Physician (Urology)   Assessment:   This is a routine wellness examination for [place].  Exercise Activities and Dietary recommendations Current Exercise Habits: Home exercise routine, Type of exercise: walking, Time (Minutes): 60, Frequency (Times/Week): 7, Weekly Exercise (Minutes/Week): 420, Intensity: Mild, Exercise limited by: Other - see comments(bilateral knee pain )  Goals    . <enter goal here> (pt-stated)     Starting 02/01/16, I will maintain my current lifestyle.        Fall Risk Fall Risk  03/07/2018 12/30/2017 08/19/2017 05/20/2017 03/25/2017  Falls in the past year? 0 0 No No No  Number falls in past yr: 0 0 - - -  Injury with Fall? 0  0 - - -  Risk for  fall due to : - - - - -   Is the patient's home free of loose throw rugs in walkways, pet beds, electrical cords, etc?   yes      Grab bars in the bathroom? yes      Handrails on the stairs?   yes      Adequate lighting?   yes  Depression Screen PHQ 2/9 Scores 03/07/2018 12/30/2017 08/19/2017 05/20/2017  PHQ - 2 Score 0 0 0 0    Cognitive Function MMSE - Mini Mental State Exam 03/07/2018 03/04/2017 02/01/2016  Not completed: - (No Data) Unable to complete  Orientation to time 4 4 5   Orientation to Place 5 4 5   Registration 3 3 3   Attention/ Calculation 4 4 3   Recall 1 0 0  Language- name 2 objects 2 2 2   Language- repeat 1 1 1   Language- follow 3 step command 2 3 0  Language- read & follow direction 1 1 0  Write a sentence 1 1 0  Copy design 1 0 0  Total score 25 23 19         Immunization History  Administered Date(s) Administered  . Influenza, High Dose Seasonal PF 10/19/2016, 11/01/2017  . Influenza,inj,Quad PF,6+ Mos 10/29/2012, 10/28/2013  . Influenza-Unspecified 10/25/2010, 11/07/2011, 10/07/2015  . Pneumococcal Conjugate-13 05/18/2014  . Pneumococcal Polysaccharide-23 01/23/2007  . Td 08/23/2010    Qualifies for Shingles Vaccine? Declined   Screening Tests Health Maintenance  Topic Date Due  . HEMOGLOBIN A1C  06/26/2018  . OPHTHALMOLOGY EXAM  08/27/2018  . FOOT EXAM  12/31/2018  . URINE MICROALBUMIN  12/31/2018  . TETANUS/TDAP  08/22/2020  . INFLUENZA VACCINE  Completed  . PNA vac Low Risk Adult  Completed   Cancer Screenings: Lung: Low Dose CT Chest recommended if Age 75-80 years, 30 pack-year currently smoking OR have quit w/in 15years. Patient does not qualify. Colorectal: Up to date   Additional Screenings: Hepatitis C Screening:Declined       Plan:   low carbohydrate,low saturated fats,high vegetable diet and exercise.   I have personally reviewed and noted the following in the patient's chart:   . Medical and social history . Use of alcohol,  tobacco or illicit drugs  . Current medications and supplements . Functional ability and status . Nutritional status . Physical activity . Advanced directives . List of other physicians . Hospitalizations, surgeries, and ER visits in previous 12 months . Vitals . Screenings to include cognitive, depression, and falls . Referrals and appointments  In addition, I have reviewed and discussed with patient certain preventive protocols, quality metrics, and best practice recommendations. A written personalized care plan for preventive services as well as general preventive health recommendations were provided to patient.   Sandrea Hughs, NP  03/07/2018

## 2018-04-03 ENCOUNTER — Other Ambulatory Visit: Payer: Self-pay

## 2018-04-03 ENCOUNTER — Encounter: Payer: Self-pay | Admitting: Internal Medicine

## 2018-04-03 ENCOUNTER — Ambulatory Visit (INDEPENDENT_AMBULATORY_CARE_PROVIDER_SITE_OTHER): Payer: Medicare Other | Admitting: Internal Medicine

## 2018-04-03 VITALS — BP 118/70 | HR 81 | Temp 97.8°F | Ht 67.0 in | Wt 162.0 lb

## 2018-04-03 DIAGNOSIS — I10 Essential (primary) hypertension: Secondary | ICD-10-CM | POA: Diagnosis not present

## 2018-04-03 DIAGNOSIS — H353232 Exudative age-related macular degeneration, bilateral, with inactive choroidal neovascularization: Secondary | ICD-10-CM | POA: Diagnosis not present

## 2018-04-03 DIAGNOSIS — E1151 Type 2 diabetes mellitus with diabetic peripheral angiopathy without gangrene: Secondary | ICD-10-CM | POA: Diagnosis not present

## 2018-04-03 DIAGNOSIS — E210 Primary hyperparathyroidism: Secondary | ICD-10-CM

## 2018-04-03 DIAGNOSIS — M159 Polyosteoarthritis, unspecified: Secondary | ICD-10-CM

## 2018-04-03 DIAGNOSIS — C61 Malignant neoplasm of prostate: Secondary | ICD-10-CM

## 2018-04-03 DIAGNOSIS — I471 Supraventricular tachycardia, unspecified: Secondary | ICD-10-CM

## 2018-04-03 DIAGNOSIS — M5412 Radiculopathy, cervical region: Secondary | ICD-10-CM | POA: Diagnosis not present

## 2018-04-03 DIAGNOSIS — G5602 Carpal tunnel syndrome, left upper limb: Secondary | ICD-10-CM

## 2018-04-05 LAB — COMPREHENSIVE METABOLIC PANEL
AG Ratio: 2.4 (calc) (ref 1.0–2.5)
ALT: 17 U/L (ref 9–46)
AST: 21 U/L (ref 10–35)
Albumin: 4.5 g/dL (ref 3.6–5.1)
Alkaline phosphatase (APISO): 62 U/L (ref 35–144)
BUN: 13 mg/dL (ref 7–25)
CO2: 26 mmol/L (ref 20–32)
Calcium: 9.8 mg/dL (ref 8.6–10.3)
Chloride: 110 mmol/L (ref 98–110)
Creat: 1.06 mg/dL (ref 0.70–1.11)
Globulin: 1.9 g/dL (calc) (ref 1.9–3.7)
Glucose, Bld: 78 mg/dL (ref 65–99)
Potassium: 4.6 mmol/L (ref 3.5–5.3)
Sodium: 144 mmol/L (ref 135–146)
Total Bilirubin: 0.5 mg/dL (ref 0.2–1.2)
Total Protein: 6.4 g/dL (ref 6.1–8.1)

## 2018-04-05 LAB — TEST AUTHORIZATION

## 2018-04-05 LAB — CBC
HCT: 39.5 % (ref 38.5–50.0)
Hemoglobin: 13.4 g/dL (ref 13.2–17.1)
MCH: 29.9 pg (ref 27.0–33.0)
MCHC: 33.9 g/dL (ref 32.0–36.0)
MCV: 88.2 fL (ref 80.0–100.0)
MPV: 11.6 fL (ref 7.5–12.5)
Platelets: 241 10*3/uL (ref 140–400)
RBC: 4.48 10*6/uL (ref 4.20–5.80)
RDW: 12.2 % (ref 11.0–15.0)
WBC: 6.4 10*3/uL (ref 3.8–10.8)

## 2018-04-05 LAB — HEMOGLOBIN A1C
Hgb A1c MFr Bld: 7.8 % of total Hgb — ABNORMAL HIGH (ref <5.7)
Mean Plasma Glucose: 177 (calc)
eAG (mmol/L): 9.8 (calc)

## 2018-04-07 NOTE — Progress Notes (Signed)
Location:  [facility] clinic Provider:  Jianni Batten L. Mariea Clonts, D.O., C.M.D.  Goals of Care:  Advanced Directives 03/07/2018  Does Patient Have a Medical Advance Directive? Yes  Type of Advance Directive Living will  Does patient want to make changes to medical advance directive? -  Copy of [place] in Chart? -  Would patient like information on creating a medical advance directive? -     Chief Complaint  Patient presents with  . Medical Management of Chronic Issues    2mth follow-up    HPI: Patient is a 85 y.o. male seen today for medical management of chronic diseases.    Both of his knees continue to bother him.  In Nov, right knee injection did help.  Left has not had an xray or anything.  He wants to avoid a knee replacement and the surgeons have refused to do it due to his hba1c (actually quite good for him now at 7.8).    His CBGs are sometimes in the low 200s at night lately.  He admits to having had a fried fish sandwich with fries from [employer] that ran it up last night.  It was then 79 this morning.  Most evenings, it's lower than that.  He takes humulin 32 units at hs and 45 units of lantus in the daytime.  He's not had lower readings than the 70s and no symptoms of hypoglycemia.  He had his hearing aids cleaned with significant improvement today in his hearing.  The right one was actually broken.  Left he can now hear the tv on 20-21 when he had it cranked up to 30 for a while which was driving his wife crazy.  They're old hearing aids and new ones are recommended but too costly for him.  He reports that he needs a prescription for his diabetic shoes--"doc comfort" which a lady at the shoe place said she faxed over a form and our staff told him we got it and completed it, but he's not heard since.  This was back on 03/05/18.  It was signed by me 2/13 and faxed back that same day.  It's scanned on 2/17.    His left arm and hand are having more issues of feeling like  electricity is passing through them--feeling hot and burning.  He's already been told it's coming from his neck when he had his nerve conduction study so he knows he's not getting that operated on.  He is "using the medication from neurology" which is gabapentin 300mg  at hs from Dr. Krista Blue.  Past Medical History:  Diagnosis Date  . Allergic rhinitis, cause unspecified   . Carpal tunnel syndrome   . Cervical spondylosis without myelopathy   . Cervical spondylosis without myelopathy   . Cervicalgia   . Diverticulosis of colon (without mention of hemorrhage)   . Esophageal reflux   . Hypertrophy of prostate with urinary obstruction and other lower urinary tract symptoms (LUTS)   . Insomnia, unspecified   . Internal hemorrhoids without mention of complication   . Irritable bowel syndrome   . Macular degeneration (senile) of retina, unspecified   . Malignant neoplasm of prostate ([place])   . Neurogenic bladder, NOS   . Neuropathy   . Nonspecific (abnormal) findings on radiological and other examination of gastrointestinal tract   . Orthostatic hypotension   . Osteoarthrosis, unspecified whether generalized or localized, unspecified site   . Other and unspecified hyperlipidemia   . Other malaise and fatigue   .  Other specified disorder of rectum and anus    Radiation Proctitus  . Paroxysmal supraventricular tachycardia ([place])   . Restless legs syndrome (RLS)   . Retinal detachment with retinal defect, unspecified   . Right bundle branch block    Incomplete  . Spermatocele    Right  . Trigger finger (acquired)   . Type II or unspecified type diabetes mellitus without mention of complication, not stated as uncontrolled   . Type II or unspecified type diabetes mellitus without mention of complication, uncontrolled   . Unspecified essential hypertension    patient denies, states he has a "fast heart rate, but no high BP"  . Vitamin D deficiency     Past Surgical History:  Procedure  Laterality Date  . Angiolipoma  1980   Right arm  . APPENDECTOMY  1951  . CHOLECYSTECTOMY  01/1994  . COLONOSCOPY  04/11/2010  . COLONOSCOPY W/ POLYPECTOMY  2005   Removed 2 (two) polyps  . DUPUYTREN CONTRACTURE RELEASE Bilateral 1989  . EYE SURGERY  2006   Left  . EYE SURGERY  2006   Retina reattachment, right  . EYE SURGERY  02/1994   Cataract surgery, right  . EYE SURGERY  01/1995   Cataract surgery, left  . KNEE SURGERY  1979  . PARATHYROIDECTOMY Left 12/27/2016   Procedure: LEFT INFERIOR PARATHYROIDECTOMY;  Surgeon: Armandina Gemma, MD;  Location: [facility];  Service: General;  Laterality: Left;  . POLYPECTOMY  1955   Vocal cord  . RETINAL DETACHMENT SURGERY Right 2005  . SHOULDER ARTHROSCOPY Right 03/22/15   [facility]  . TENDON RELEASE  01/1997   seven fingers  . TRANSURETHRAL RESECTION OF PROSTATE  08/1989  . VITRECTOMY Left 04/23/2013   [facility]    Allergies  Allergen Reactions  . Aspirin Other (See Comments)    Bothers stomach, thins blood   . Atorvastatin Other (See Comments)    Muscular pain and weakness  . Prednisone Other (See Comments)    Upset stomach, can take by shot not by mouth  . Simvastatin Other (See Comments)    Muscular pain and weakness  . Sulfa Antibiotics Other (See Comments)    Upset stomach   . Latex Rash  . Metformin Hcl Nausea Only  . Voltaren [Diclofenac Sodium] Other (See Comments)    Unknown    Outpatient Encounter Medications as of 04/03/2018  Medication Sig  . acetaminophen (TYLENOL) 500 MG tablet Take 1,000 mg by mouth 3 (three) times daily.  Marland Kitchen alum & mag hydroxide-simeth (MAALOX/MYLANTA) 200-200-20 MG/5ML suspension Take 30 mLs by mouth as needed for indigestion or heartburn.  . cholecalciferol (VITAMIN D) 400 units TABS tablet Take 400 Units by mouth daily.  Marland Kitchen docusate sodium (COLACE) 100 MG capsule Take 100 mg by mouth daily.  . famotidine (PEPCID AC) 10 MG chewable tablet Chew 10 mg by mouth daily as needed for heartburn.  .  gabapentin (NEURONTIN) 100 MG capsule One to 3 tabs every night.  . Insulin Glargine (LANTUS SOLOSTAR) 100 UNIT/ML Solostar Pen INJECT 45 UNITS  SUBCUTANEOUSLY IN THE  MORNING  . Insulin Isophane & Regular Human (HUMULIN 70/30 MIX) (70-30) 100 UNIT/ML PEN Inject 22 Units into the skin every evening.  . Insulin Pen Needle (B-D ULTRAFINE III SHORT PEN) 31G X 8 MM MISC Use twice daily for insulin  . loratadine (CLARITIN) 10 MG tablet Take 10 mg by mouth daily.  . metoprolol tartrate (LOPRESSOR) 50 MG tablet TAKE 1 TABLET BY MOUTH TWO  TIMES  DAILY  . nystatin-triamcinolone (MYCOLOG II) cream Apply topically 4 (four) times daily.  . Probiotic Product (PROBIOTIC-10) CHEW Chew 1 each by mouth daily.   . tamsulosin (FLOMAX) 0.4 MG CAPS capsule TAKE 1 CAPSULE BY MOUTH  DAILY FOR PROSTATE  . trolamine salicylate (ASPERCREME) 10 % cream Apply 1 application topically as needed for muscle pain.   No facility-administered encounter medications on file as of 04/03/2018.     Review of Systems:  Review of Systems  Constitutional: Negative for chills, fever and malaise/fatigue.  HENT: Positive for hearing loss.   Eyes: Positive for blurred vision.  Respiratory: Negative for cough and shortness of breath.   Cardiovascular: Negative for chest pain, palpitations and leg swelling.  Gastrointestinal: Negative for abdominal pain, blood in stool, constipation, diarrhea and melena.  Genitourinary: Positive for frequency. Negative for dysuria.  Musculoskeletal: Positive for joint pain and neck pain. Negative for falls.  Skin: Negative for itching and rash.  Neurological: Positive for tingling and sensory change. Negative for dizziness and loss of consciousness.  Endo/Heme/Allergies: Bruises/bleeds easily.  Psychiatric/Behavioral: Negative for depression and memory loss. The patient is not nervous/anxious and does not have insomnia.     Health Maintenance  Topic Date Due  . OPHTHALMOLOGY EXAM  08/27/2018  .  HEMOGLOBIN A1C  10/04/2018  . FOOT EXAM  12/31/2018  . URINE MICROALBUMIN  12/31/2018  . TETANUS/TDAP  08/22/2020  . INFLUENZA VACCINE  Completed  . PNA vac Low Risk Adult  Completed    Physical Exam: Vitals:   04/03/18 1011  BP: 118/70  Pulse: 81  Temp: 97.8 F (36.6 C)  TempSrc: Oral  SpO2: 96%  Weight: 162 lb (73.5 kg)  Height: 5\' 7"  (1.702 m)   Body mass index is 25.37 kg/m. Physical Exam Vitals signs reviewed.  Constitutional:      Appearance: He is normal weight.  HENT:     Head: Normocephalic and atraumatic.     Ears:     Comments: Left hearing aid in place Cardiovascular:     Rate and Rhythm: Normal rate and regular rhythm.     Pulses: Normal pulses.     Heart sounds: Normal heart sounds.  Pulmonary:     Effort: Pulmonary effort is normal.     Breath sounds: Normal breath sounds.  Abdominal:     General: Bowel sounds are normal.     Palpations: Abdomen is soft.  Musculoskeletal: Normal range of motion.     Comments: Tenderness b/l knees; diminished sensation right lateral foot   Skin:    General: Skin is warm and dry.  Neurological:     General: No focal deficit present.     Mental Status: He is alert and oriented to person, place, and time.  Psychiatric:        Mood and Affect: Mood normal.     Labs reviewed: Basic Metabolic Panel: Recent Labs    08/15/17 0820 12/25/17 0826 04/03/18 0958  NA 141 142 144  K 4.1 4.4 4.6  CL 109 107 110  CO2 26 29 26   GLUCOSE 92 48* 78  BUN 14 19 13   CREATININE 0.89 0.98 1.06  CALCIUM 9.5 9.6 9.8   Liver Function Tests: Recent Labs    08/15/17 0820 04/03/18 0958  AST 15 21  ALT 12 17  BILITOT 0.5 0.5  PROT 6.2 6.4   No results for input(s): LIPASE, AMYLASE in the last 8760 hours. No results for input(s): AMMONIA in the last 8760 hours. CBC: Recent  Labs    08/15/17 0820 12/25/17 0826 04/03/18 0958  WBC 4.9 8.8 6.4  NEUTROABS 3,038 6,662  --   HGB 13.2 13.7 13.4  HCT 37.9* 39.6 39.5  MCV  87.5 86.3 88.2  PLT 200 228 241   Lipid Panel: Recent Labs    08/15/17 0820  CHOL 251*  HDL 38*  LDLCALC 171*  TRIG 256*  CHOLHDL 6.6*   Lab Results  Component Value Date   HGBA1C 7.8 (H) 04/03/2018    Procedures since last visit: No results found.  Assessment/Plan 1. DM (diabetes mellitus), type 2 with peripheral vascular complications (HCC) - control has improved with latest 7.8 hba1c--satisfactory at his age and this long with diabetes, not getting lows--cont same regimen and monitor - Hemoglobin A1c; Future - Hemoglobin A1c -no new circulatory complaints -paperwork completed for diabetic shoes last month but pt has not heard back from company yet  2. Essential hypertension -bp is well-controlled, cont same regimen and monitor - CMP - CBC (no diff)  3. Exudative age-related macular degeneration of both eyes with inactive choroidal neovascularization ([place]) -second biggest issue outside of his arthritis pains -advanced and ophtho at [facility] could not do much for him anymore  4. Paroxysmal supraventricular tachycardia (HCC) -no recent symptoms, cont to monitor  5. Primary hyperparathyroidism ([place]) -s/p parathyroidectomy which did help with cramping and stiffness he was having and calcium levels have normalized  6. Malignant neoplasm of prostate [facility]) -follows with [facility], last note from dec '19 -had XRT and degaralix hormonal therapy per notes (not on med list now)--PSA 12/19 was 0.019 and has been low since his treatments were started -has urinary incontinence, takes flomax and uses briefs  7. Chronic cervical radiculopathy -taking gabapentin at hs -reports this may be getting worse -may be able to increase his gabapentin if tolerated by his renal function if this persists  8. Carpal tunnel syndrome on left -also contributing to his left arm/hand pain -not a good surgical candidate with his dm and age -may benefit from a wrist splint--can discuss next  time if this remains a concern for him  9. Generalized osteoarthritis of multiple sites -ongoing, cont aspercreme, tylenol, regular activities  Labs/tests ordered:   Orders Placed This Encounter  Procedures  . Hemoglobin A1c    Standing Status:   Future    Number of Occurrences:   1    Standing Expiration Date:   04/03/2019  . CMP  . CBC (no diff)  . CBC  . Hemoglobin A1c  . TEST AUTHORIZATION    Next appt:  4 mos med mgt, fasting labs before  Kerensa Nicklas L. Amandine Covino, D.O. [facility] [address], [place] [address] Cell Phone (Mon-Fri 8am-5pm):  [phone] On Call:  [phone] & follow prompts after 5pm & weekends Office Phone:  [phone] Office Fax:  [phone]

## 2018-04-18 NOTE — Telephone Encounter (Addendum)
During his telephone visit with Dr. Mariea Clonts pt expressed he haven't received his diabetic shoes.   Spoke with Wende Neighbors about pt's order for diabetic shoes ( she did receive our order) and she wasn't in the office at the moment and she will call pt today after lunch to discuss.

## 2018-04-20 ENCOUNTER — Other Ambulatory Visit: Payer: Self-pay | Admitting: Internal Medicine

## 2018-04-20 DIAGNOSIS — I471 Supraventricular tachycardia: Secondary | ICD-10-CM

## 2018-06-02 ENCOUNTER — Other Ambulatory Visit: Payer: Self-pay | Admitting: Internal Medicine

## 2018-06-19 ENCOUNTER — Encounter: Payer: Self-pay | Admitting: Internal Medicine

## 2018-06-19 ENCOUNTER — Other Ambulatory Visit: Payer: Self-pay

## 2018-06-19 ENCOUNTER — Ambulatory Visit (INDEPENDENT_AMBULATORY_CARE_PROVIDER_SITE_OTHER): Payer: Medicare Other | Admitting: Internal Medicine

## 2018-06-19 VITALS — BP 120/80 | HR 84 | Temp 97.7°F | Ht 67.0 in | Wt 161.0 lb

## 2018-06-19 DIAGNOSIS — G5622 Lesion of ulnar nerve, left upper limb: Secondary | ICD-10-CM | POA: Diagnosis not present

## 2018-06-19 DIAGNOSIS — G5602 Carpal tunnel syndrome, left upper limb: Secondary | ICD-10-CM | POA: Diagnosis not present

## 2018-06-19 DIAGNOSIS — M170 Bilateral primary osteoarthritis of knee: Secondary | ICD-10-CM | POA: Diagnosis not present

## 2018-06-19 MED ORDER — GABAPENTIN 300 MG PO CAPS: 300.0000 mg | ORAL_CAPSULE | Freq: Two times a day (BID) | ORAL | 1 refills | Status: DC

## 2018-06-19 NOTE — Patient Instructions (Addendum)
Start taking gabapentin 300mg  twice a day (morning and bedtime) to help your nerve pain in your left arm.  Follow-up with orthopedics about your knee injection.

## 2018-06-19 NOTE — Progress Notes (Signed)
Location:  [facility] clinic Provider:  Secret Kristensen L. Mariea Clonts, D.O., C.M.D.  Code Status: DNR Goals of Care:  Advanced Directives 03/07/2018  Does Patient Have a Medical Advance Directive? Yes  Type of Advance Directive Living will  Does patient want to make changes to medical advance directive? -  Copy of [place] in Chart? -  Would patient like information on creating a medical advance directive? -     Chief Complaint  Patient presents with  . Acute Visit    left arm pain x weeks, burning, numbing pain    HPI: Patient is a 85 y.o. [name] seen today for acute visit for left arm pain that is burning and numb in his left arm for several weeks.  He wakes up at 4-5am and the hand and forearm feel like he has his hand in boiling water.  Says Dr. Krista Blue gave him that--it helps him some but it doesn't stop the pain.  He's tried every position.  His MRI did not show nerve compression in his neck.  His nerve conduction study showed left severe carpal tunnel, ulnar neuropathy and some cervical radiculopathy.    Both knees are killing him.  The shots help the right one, but not the left.  He even had the cartilage shots but didn't make a bit of difference.  He's almost crippled.  This is the worst year for him hurting almost continuously.  He walks the dog 1-2 miles a day on flat surface.  If he stoops down to do anything, he hurts so bad, he can hardly maneuver.  He bruises so easily.  He takes two tylenol 2-3 times a day.  He staggers when he stands up, also.    A few months ago, he just missed stepping on his dog and he "bounced off the walls" but never did fall.  He's afraid he tore cartilage there.  Past Medical History:  Diagnosis Date  . Allergic rhinitis, cause unspecified   . Carpal tunnel syndrome   . Cervical spondylosis without myelopathy   . Cervical spondylosis without myelopathy   . Cervicalgia   . Diverticulosis of colon (without mention of hemorrhage)   . Esophageal  reflux   . Hypertrophy of prostate with urinary obstruction and other lower urinary tract symptoms (LUTS)   . Insomnia, unspecified   . Internal hemorrhoids without mention of complication   . Irritable bowel syndrome   . Macular degeneration (senile) of retina, unspecified   . Malignant neoplasm of prostate ([place])   . Neurogenic bladder, NOS   . Neuropathy   . Nonspecific (abnormal) findings on radiological and other examination of gastrointestinal tract   . Orthostatic hypotension   . Osteoarthrosis, unspecified whether generalized or localized, unspecified site   . Other and unspecified hyperlipidemia   . Other malaise and fatigue   . Other specified disorder of rectum and anus    Radiation Proctitus  . Paroxysmal supraventricular tachycardia ([place])   . Restless legs syndrome (RLS)   . Retinal detachment with retinal defect, unspecified   . Right bundle branch block    Incomplete  . Spermatocele    Right  . Trigger finger (acquired)   . Type II or unspecified type diabetes mellitus without mention of complication, not stated as uncontrolled   . Type II or unspecified type diabetes mellitus without mention of complication, uncontrolled   . Unspecified essential hypertension    patient denies, states he has a "fast heart rate, but no high  BP"  . Vitamin D deficiency     Past Surgical History:  Procedure Laterality Date  . Angiolipoma  1980   Right arm  . APPENDECTOMY  1951  . CHOLECYSTECTOMY  01/1994  . COLONOSCOPY  04/11/2010  . COLONOSCOPY W/ POLYPECTOMY  2005   Removed 2 (two) polyps  . DUPUYTREN CONTRACTURE RELEASE Bilateral 1989  . EYE SURGERY  2006   Left  . EYE SURGERY  2006   Retina reattachment, right  . EYE SURGERY  02/1994   Cataract surgery, right  . EYE SURGERY  01/1995   Cataract surgery, left  . KNEE SURGERY  1979  . PARATHYROIDECTOMY Left 12/27/2016   Procedure: LEFT INFERIOR PARATHYROIDECTOMY;  Surgeon: Armandina Gemma, MD;  Location: [facility];  Service:  General;  Laterality: Left;  . POLYPECTOMY  1955   Vocal cord  . RETINAL DETACHMENT SURGERY Right 2005  . SHOULDER ARTHROSCOPY Right 03/22/15   [facility]  . TENDON RELEASE  01/1997   seven fingers  . TRANSURETHRAL RESECTION OF PROSTATE  08/1989  . VITRECTOMY Left 04/23/2013   [facility]    Allergies  Allergen Reactions  . Aspirin Other (See Comments)    Bothers stomach, thins blood   . Atorvastatin Other (See Comments)    Muscular pain and weakness  . Prednisone Other (See Comments)    Upset stomach, can take by shot not by mouth  . Simvastatin Other (See Comments)    Muscular pain and weakness  . Sulfa Antibiotics Other (See Comments)    Upset stomach   . Latex Rash  . Metformin Hcl Nausea Only  . Voltaren [Diclofenac Sodium] Other (See Comments)    Unknown    Outpatient Encounter Medications as of 06/19/2018  Medication Sig  . acetaminophen (TYLENOL) 500 MG tablet Take 1,000 mg by mouth 3 (three) times daily.  Marland Kitchen alum & mag hydroxide-simeth (MAALOX/MYLANTA) 200-200-20 MG/5ML suspension Take 30 mLs by mouth as needed for indigestion or heartburn.  . cholecalciferol (VITAMIN D) 400 units TABS tablet Take 400 Units by mouth daily.  Marland Kitchen docusate sodium (COLACE) 100 MG capsule Take 100 mg by mouth daily.  . famotidine (PEPCID AC) 10 MG chewable tablet Chew 10 mg by mouth daily as needed for heartburn.  . gabapentin (NEURONTIN) 100 MG capsule Take 100-300 mg by mouth at bedtime.  . Insulin Glargine (LANTUS SOLOSTAR) 100 UNIT/ML Solostar Pen INJECT 45 UNITS  SUBCUTANEOUSLY IN THE  MORNING  . Insulin Isophane & Regular Human (HUMULIN 70/30 MIX) (70-30) 100 UNIT/ML PEN Inject 22 Units into the skin every evening.  . Insulin Pen Needle (B-D ULTRAFINE III SHORT PEN) 31G X 8 MM MISC Use twice daily for insulin  . loratadine (CLARITIN) 10 MG tablet Take 10 mg by mouth daily.  . metoprolol tartrate (LOPRESSOR) 50 MG tablet TAKE 1 TABLET BY MOUTH TWO  TIMES DAILY  . nystatin-triamcinolone  (MYCOLOG II) cream Apply topically 4 (four) times daily.  . Probiotic Product (PROBIOTIC-10) CHEW Chew 1 each by mouth daily.   . tamsulosin (FLOMAX) 0.4 MG CAPS capsule TAKE 1 CAPSULE BY MOUTH  DAILY FOR PROSTATE  . trolamine salicylate (ASPERCREME) 10 % cream Apply 1 application topically as needed for muscle pain.  . [DISCONTINUED] gabapentin (NEURONTIN) 100 MG capsule One to 3 tabs every night. (Patient taking differently: Take 100-300 mg by mouth at bedtime. One to 3 tabs every night.)   No facility-administered encounter medications on file as of 06/19/2018.     Review of Systems:  Review  of Systems  Constitutional: Negative for chills, fever and malaise/fatigue.  HENT: Positive for hearing loss.   Eyes: Positive for blurred vision (macular degeneration severe).  Respiratory: Negative for cough and shortness of breath.   Cardiovascular: Negative for chest pain, palpitations and leg swelling.  Gastrointestinal: Negative for abdominal pain, blood in stool, constipation, diarrhea and melena.  Genitourinary: Negative for dysuria.  Musculoskeletal: Positive for back pain and joint pain. Negative for falls.  Skin: Negative for itching and rash.  Neurological: Positive for tingling and sensory change. Negative for dizziness and loss of consciousness.  Endo/Heme/Allergies: Bruises/bleeds easily.  Psychiatric/Behavioral: Negative for depression and memory loss. The patient is not nervous/anxious and does not have insomnia.     Health Maintenance  Topic Date Due  . INFLUENZA VACCINE  08/23/2018  . OPHTHALMOLOGY EXAM  08/27/2018  . HEMOGLOBIN A1C  10/04/2018  . URINE MICROALBUMIN  12/31/2018  . FOOT EXAM  04/03/2019  . TETANUS/TDAP  08/22/2020  . PNA vac Low Risk Adult  Completed    Physical Exam: Vitals:   06/19/18 0835  BP: 120/80  Pulse: 84  Temp: 97.7 F (36.5 C)  TempSrc: Oral  SpO2: 97%  Weight: 161 lb (73 kg)  Height: 5\' 7"  (1.702 m)   Body mass index is 25.22 kg/m.  Physical Exam Vitals signs reviewed.  Constitutional:      Appearance: Normal appearance.     Comments: Appeared uncomfortable walking in and getting up out of exam chair today  HENT:     Ears:     Comments: Very HOH Eyes:     Comments: Poor vision and eye contact  Cardiovascular:     Rate and Rhythm: Normal rate and regular rhythm.     Pulses: Normal pulses.     Heart sounds: Normal heart sounds.  Pulmonary:     Effort: Pulmonary effort is normal.     Breath sounds: Normal breath sounds. No wheezing, rhonchi or rales.  Musculoskeletal:        General: Swelling, tenderness and deformity present.     Right lower leg: No edema.     Left lower leg: No edema.     Comments: Bilateral knees  Skin:    General: Skin is warm and dry.  Neurological:     General: No focal deficit present.     Mental Status: He is alert and oriented to person, place, and time.     Gait: Gait abnormal.  Psychiatric:        Mood and Affect: Mood normal.     Comments: Very pleasant; talkative     Labs reviewed: Basic Metabolic Panel: Recent Labs    08/15/17 0820 12/25/17 0826 04/03/18 0958  NA 141 142 144  K 4.1 4.4 4.6  CL 109 107 110  CO2 26 29 26   GLUCOSE 92 48* 78  BUN 14 19 13   CREATININE 0.89 0.98 1.06  CALCIUM 9.5 9.6 9.8   Liver Function Tests: Recent Labs    08/15/17 0820 04/03/18 0958  AST 15 21  ALT 12 17  BILITOT 0.5 0.5  PROT 6.2 6.4   No results for input(s): LIPASE, AMYLASE in the last 8760 hours. No results for input(s): AMMONIA in the last 8760 hours. CBC: Recent Labs    08/15/17 0820 12/25/17 0826 04/03/18 0958  WBC 4.9 8.8 6.4  NEUTROABS 3,038 6,662  --   HGB 13.2 13.7 13.4  HCT 37.9* 39.6 39.5  MCV 87.5 86.3 88.2  PLT 200 228 241  Lipid Panel: Recent Labs    08/15/17 0820  CHOL 251*  HDL 38*  LDLCALC 171*  TRIG 256*  CHOLHDL 6.6*   Lab Results  Component Value Date   HGBA1C 7.8 (H) 04/03/2018    Assessment/Plan 1. Left carpal tunnel  syndrome - per EMG/NCS - increase gabapentin to 300mg  po bid from qhs -if side effects develop, let me know - gabapentin (NEURONTIN) 300 MG capsule; Take 1 capsule (300 mg total) by mouth 2 (two) times daily.  Dispense: 180 capsule; Refill: 1  2. Ulnar neuropathy of left upper extremity - also per EMG/NCS - gabapentin (NEURONTIN) 300 MG capsule; Take 1 capsule (300 mg total) by mouth 2 (two) times daily.  Dispense: 180 capsule; Refill: 1  3. Primary osteoarthritis of both knees -f/u with orthopedics for knee injection since they are performing these on him Lab Results  Component Value Date   HGBA1C 7.8 (H) 04/03/2018  is probably the best we can get his glucose without hypoglycemia  Labs/tests ordered:  hba1c and bmp were ordered last time for before his routine visit (today was acute)  Next appt:  Had an appt that for some reason was canceled that was his routine visit with labs before; now doesn't have anything but his wellness scheduled when I looked  Jetta Murray L. Evelio Rueda, D.O. [facility] [address], [place] [address] Cell Phone (Mon-Fri 8am-5pm):  [phone] On Call:  [phone] & follow prompts after 5pm & weekends Office Phone:  [phone] Office Fax:  [phone]

## 2018-06-23 ENCOUNTER — Other Ambulatory Visit: Payer: Self-pay | Admitting: *Deleted

## 2018-06-23 DIAGNOSIS — G5622 Lesion of ulnar nerve, left upper limb: Secondary | ICD-10-CM

## 2018-06-23 DIAGNOSIS — G5602 Carpal tunnel syndrome, left upper limb: Secondary | ICD-10-CM

## 2018-06-23 MED ORDER — GABAPENTIN 300 MG PO CAPS: 300.0000 mg | ORAL_CAPSULE | Freq: Two times a day (BID) | ORAL | 1 refills | Status: DC

## 2018-06-23 NOTE — Telephone Encounter (Signed)
Patient wife called and stated that patient did not received his medication that Dr. Mariea Clonts changed at last appointment for Gabapentin. Rx sent to Pharmacy.  Rx in system had No Print.

## 2018-06-28 ENCOUNTER — Inpatient Hospital Stay (HOSPITAL_COMMUNITY)
Admission: EM | Admit: 2018-06-28 | Discharge: 2018-07-02 | DRG: 872 | Disposition: A | Discharge status: Home Health | Payer: Medicare Other | Attending: Family Medicine | Admitting: Family Medicine

## 2018-06-28 ENCOUNTER — Encounter (HOSPITAL_COMMUNITY): Payer: Self-pay

## 2018-06-28 DIAGNOSIS — R062 Wheezing: Secondary | ICD-10-CM

## 2018-06-28 DIAGNOSIS — S81832A Puncture wound without foreign body, left lower leg, initial encounter: Secondary | ICD-10-CM | POA: Diagnosis present

## 2018-06-28 DIAGNOSIS — Z886 Allergy status to analgesic agent status: Secondary | ICD-10-CM

## 2018-06-28 DIAGNOSIS — R6510 Systemic inflammatory response syndrome (SIRS) of non-infectious origin without acute organ dysfunction: Secondary | ICD-10-CM | POA: Diagnosis not present

## 2018-06-28 DIAGNOSIS — Y93H2 Activity, gardening and landscaping: Secondary | ICD-10-CM

## 2018-06-28 DIAGNOSIS — R000 Tachycardia, unspecified: Secondary | ICD-10-CM | POA: Diagnosis not present

## 2018-06-28 DIAGNOSIS — S81809A Unspecified open wound, unspecified lower leg, initial encounter: Secondary | ICD-10-CM

## 2018-06-28 DIAGNOSIS — C61 Malignant neoplasm of prostate: Secondary | ICD-10-CM | POA: Diagnosis present

## 2018-06-28 DIAGNOSIS — Z91040 Latex allergy status: Secondary | ICD-10-CM

## 2018-06-28 DIAGNOSIS — Z20828 Contact with and (suspected) exposure to other viral communicable diseases: Secondary | ICD-10-CM | POA: Diagnosis present

## 2018-06-28 DIAGNOSIS — W271XXA Contact with garden tool, initial encounter: Secondary | ICD-10-CM

## 2018-06-28 DIAGNOSIS — N319 Neuromuscular dysfunction of bladder, unspecified: Secondary | ICD-10-CM | POA: Diagnosis present

## 2018-06-28 DIAGNOSIS — Z882 Allergy status to sulfonamides status: Secondary | ICD-10-CM

## 2018-06-28 DIAGNOSIS — E1151 Type 2 diabetes mellitus with diabetic peripheral angiopathy without gangrene: Secondary | ICD-10-CM | POA: Diagnosis present

## 2018-06-28 DIAGNOSIS — I10 Essential (primary) hypertension: Secondary | ICD-10-CM | POA: Diagnosis not present

## 2018-06-28 DIAGNOSIS — R509 Fever, unspecified: Secondary | ICD-10-CM | POA: Diagnosis present

## 2018-06-28 DIAGNOSIS — Y92017 Garden or yard in single-family (private) house as the place of occurrence of the external cause: Secondary | ICD-10-CM

## 2018-06-28 DIAGNOSIS — Z23 Encounter for immunization: Secondary | ICD-10-CM

## 2018-06-28 DIAGNOSIS — Z888 Allergy status to other drugs, medicaments and biological substances status: Secondary | ICD-10-CM

## 2018-06-28 DIAGNOSIS — S81802A Unspecified open wound, left lower leg, initial encounter: Secondary | ICD-10-CM

## 2018-06-28 DIAGNOSIS — Z818 Family history of other mental and behavioral disorders: Secondary | ICD-10-CM

## 2018-06-28 DIAGNOSIS — Z8249 Family history of ischemic heart disease and other diseases of the circulatory system: Secondary | ICD-10-CM

## 2018-06-28 DIAGNOSIS — N179 Acute kidney failure, unspecified: Secondary | ICD-10-CM | POA: Diagnosis present

## 2018-06-28 DIAGNOSIS — R29898 Other symptoms and signs involving the musculoskeletal system: Secondary | ICD-10-CM

## 2018-06-28 DIAGNOSIS — K219 Gastro-esophageal reflux disease without esophagitis: Secondary | ICD-10-CM | POA: Diagnosis present

## 2018-06-28 DIAGNOSIS — E785 Hyperlipidemia, unspecified: Secondary | ICD-10-CM | POA: Diagnosis present

## 2018-06-28 DIAGNOSIS — E876 Hypokalemia: Secondary | ICD-10-CM | POA: Diagnosis not present

## 2018-06-28 DIAGNOSIS — Z923 Personal history of irradiation: Secondary | ICD-10-CM

## 2018-06-28 DIAGNOSIS — Z833 Family history of diabetes mellitus: Secondary | ICD-10-CM

## 2018-06-28 DIAGNOSIS — D649 Anemia, unspecified: Secondary | ICD-10-CM | POA: Diagnosis present

## 2018-06-28 DIAGNOSIS — A419 Sepsis, unspecified organism: Principal | ICD-10-CM | POA: Diagnosis present

## 2018-06-28 DIAGNOSIS — Z794 Long term (current) use of insulin: Secondary | ICD-10-CM

## 2018-06-28 DIAGNOSIS — G2581 Restless legs syndrome: Secondary | ICD-10-CM | POA: Diagnosis present

## 2018-06-28 DIAGNOSIS — Z8546 Personal history of malignant neoplasm of prostate: Secondary | ICD-10-CM

## 2018-06-28 DIAGNOSIS — M1990 Unspecified osteoarthritis, unspecified site: Secondary | ICD-10-CM | POA: Diagnosis present

## 2018-06-28 DIAGNOSIS — I4510 Unspecified right bundle-branch block: Secondary | ICD-10-CM | POA: Diagnosis present

## 2018-06-28 DIAGNOSIS — M5412 Radiculopathy, cervical region: Secondary | ICD-10-CM | POA: Diagnosis present

## 2018-06-28 NOTE — ED Triage Notes (Signed)
Pt states that he began to feel bad tonight having chills and fatigue, fevers, pt states that he cut his leg today while he was mowing the lawn, small abrasion to L leg. Denies cough or SOB, unknown last tetanus, pt took tylenol PTA

## 2018-06-28 NOTE — ED Notes (Signed)
Wife:  Estill Bamberg cell phone # [phone]

## 2018-06-29 ENCOUNTER — Emergency Department (HOSPITAL_COMMUNITY): Payer: Medicare Other

## 2018-06-29 ENCOUNTER — Other Ambulatory Visit (HOSPITAL_COMMUNITY): Payer: Medicare Other

## 2018-06-29 ENCOUNTER — Encounter (HOSPITAL_COMMUNITY): Payer: Self-pay | Admitting: Family Medicine

## 2018-06-29 ENCOUNTER — Inpatient Hospital Stay (HOSPITAL_COMMUNITY): Payer: Medicare Other

## 2018-06-29 DIAGNOSIS — Z882 Allergy status to sulfonamides status: Secondary | ICD-10-CM | POA: Diagnosis not present

## 2018-06-29 DIAGNOSIS — R509 Fever, unspecified: Secondary | ICD-10-CM | POA: Diagnosis present

## 2018-06-29 DIAGNOSIS — K219 Gastro-esophageal reflux disease without esophagitis: Secondary | ICD-10-CM | POA: Diagnosis present

## 2018-06-29 DIAGNOSIS — M1990 Unspecified osteoarthritis, unspecified site: Secondary | ICD-10-CM | POA: Diagnosis not present

## 2018-06-29 DIAGNOSIS — R29898 Other symptoms and signs involving the musculoskeletal system: Secondary | ICD-10-CM | POA: Diagnosis not present

## 2018-06-29 DIAGNOSIS — S81802D Unspecified open wound, left lower leg, subsequent encounter: Secondary | ICD-10-CM | POA: Diagnosis not present

## 2018-06-29 DIAGNOSIS — Z833 Family history of diabetes mellitus: Secondary | ICD-10-CM | POA: Diagnosis not present

## 2018-06-29 DIAGNOSIS — S81802S Unspecified open wound, left lower leg, sequela: Secondary | ICD-10-CM | POA: Diagnosis not present

## 2018-06-29 DIAGNOSIS — E119 Type 2 diabetes mellitus without complications: Secondary | ICD-10-CM | POA: Diagnosis not present

## 2018-06-29 DIAGNOSIS — Z8249 Family history of ischemic heart disease and other diseases of the circulatory system: Secondary | ICD-10-CM | POA: Diagnosis not present

## 2018-06-29 DIAGNOSIS — G2581 Restless legs syndrome: Secondary | ICD-10-CM | POA: Diagnosis present

## 2018-06-29 DIAGNOSIS — R6510 Systemic inflammatory response syndrome (SIRS) of non-infectious origin without acute organ dysfunction: Secondary | ICD-10-CM | POA: Diagnosis not present

## 2018-06-29 DIAGNOSIS — M25552 Pain in left hip: Secondary | ICD-10-CM | POA: Diagnosis not present

## 2018-06-29 DIAGNOSIS — Z20828 Contact with and (suspected) exposure to other viral communicable diseases: Secondary | ICD-10-CM | POA: Diagnosis present

## 2018-06-29 DIAGNOSIS — I4510 Unspecified right bundle-branch block: Secondary | ICD-10-CM | POA: Diagnosis present

## 2018-06-29 DIAGNOSIS — Y93H2 Activity, gardening and landscaping: Secondary | ICD-10-CM | POA: Diagnosis not present

## 2018-06-29 DIAGNOSIS — Z9221 Personal history of antineoplastic chemotherapy: Secondary | ICD-10-CM | POA: Diagnosis not present

## 2018-06-29 DIAGNOSIS — Z8546 Personal history of malignant neoplasm of prostate: Secondary | ICD-10-CM | POA: Diagnosis not present

## 2018-06-29 DIAGNOSIS — L089 Local infection of the skin and subcutaneous tissue, unspecified: Secondary | ICD-10-CM | POA: Diagnosis not present

## 2018-06-29 DIAGNOSIS — W271XXA Contact with garden tool, initial encounter: Secondary | ICD-10-CM | POA: Diagnosis not present

## 2018-06-29 DIAGNOSIS — Z23 Encounter for immunization: Secondary | ICD-10-CM | POA: Diagnosis not present

## 2018-06-29 DIAGNOSIS — E1151 Type 2 diabetes mellitus with diabetic peripheral angiopathy without gangrene: Secondary | ICD-10-CM

## 2018-06-29 DIAGNOSIS — L03312 Cellulitis of back [any part except buttock]: Secondary | ICD-10-CM | POA: Diagnosis not present

## 2018-06-29 DIAGNOSIS — R600 Localized edema: Secondary | ICD-10-CM | POA: Diagnosis not present

## 2018-06-29 DIAGNOSIS — Z91040 Latex allergy status: Secondary | ICD-10-CM | POA: Diagnosis not present

## 2018-06-29 DIAGNOSIS — Z818 Family history of other mental and behavioral disorders: Secondary | ICD-10-CM | POA: Diagnosis not present

## 2018-06-29 DIAGNOSIS — M25551 Pain in right hip: Secondary | ICD-10-CM | POA: Diagnosis not present

## 2018-06-29 DIAGNOSIS — R062 Wheezing: Secondary | ICD-10-CM | POA: Diagnosis not present

## 2018-06-29 DIAGNOSIS — N179 Acute kidney failure, unspecified: Secondary | ICD-10-CM | POA: Diagnosis present

## 2018-06-29 DIAGNOSIS — Z794 Long term (current) use of insulin: Secondary | ICD-10-CM | POA: Diagnosis not present

## 2018-06-29 DIAGNOSIS — S81832A Puncture wound without foreign body, left lower leg, initial encounter: Secondary | ICD-10-CM | POA: Diagnosis not present

## 2018-06-29 DIAGNOSIS — A419 Sepsis, unspecified organism: Secondary | ICD-10-CM | POA: Diagnosis present

## 2018-06-29 DIAGNOSIS — M5127 Other intervertebral disc displacement, lumbosacral region: Secondary | ICD-10-CM | POA: Diagnosis not present

## 2018-06-29 DIAGNOSIS — Z886 Allergy status to analgesic agent status: Secondary | ICD-10-CM | POA: Diagnosis not present

## 2018-06-29 DIAGNOSIS — Z881 Allergy status to other antibiotic agents status: Secondary | ICD-10-CM | POA: Diagnosis not present

## 2018-06-29 DIAGNOSIS — S81802A Unspecified open wound, left lower leg, initial encounter: Secondary | ICD-10-CM | POA: Diagnosis not present

## 2018-06-29 DIAGNOSIS — E785 Hyperlipidemia, unspecified: Secondary | ICD-10-CM | POA: Diagnosis present

## 2018-06-29 DIAGNOSIS — N319 Neuromuscular dysfunction of bladder, unspecified: Secondary | ICD-10-CM | POA: Diagnosis present

## 2018-06-29 DIAGNOSIS — I10 Essential (primary) hypertension: Secondary | ICD-10-CM | POA: Diagnosis not present

## 2018-06-29 DIAGNOSIS — M5412 Radiculopathy, cervical region: Secondary | ICD-10-CM | POA: Diagnosis not present

## 2018-06-29 DIAGNOSIS — M47816 Spondylosis without myelopathy or radiculopathy, lumbar region: Secondary | ICD-10-CM | POA: Diagnosis not present

## 2018-06-29 DIAGNOSIS — Y92017 Garden or yard in single-family (private) house as the place of occurrence of the external cause: Secondary | ICD-10-CM | POA: Diagnosis not present

## 2018-06-29 DIAGNOSIS — H3530 Unspecified macular degeneration: Secondary | ICD-10-CM | POA: Diagnosis not present

## 2018-06-29 DIAGNOSIS — Z888 Allergy status to other drugs, medicaments and biological substances status: Secondary | ICD-10-CM | POA: Diagnosis not present

## 2018-06-29 DIAGNOSIS — X58XXXA Exposure to other specified factors, initial encounter: Secondary | ICD-10-CM | POA: Diagnosis not present

## 2018-06-29 DIAGNOSIS — Z923 Personal history of irradiation: Secondary | ICD-10-CM | POA: Diagnosis not present

## 2018-06-29 LAB — URINALYSIS, ROUTINE W REFLEX MICROSCOPIC
Bilirubin Urine: NEGATIVE
Glucose, UA: NEGATIVE mg/dL
Hgb urine dipstick: NEGATIVE
Ketones, ur: NEGATIVE mg/dL
Leukocytes,Ua: NEGATIVE
Nitrite: NEGATIVE
Protein, ur: NEGATIVE mg/dL
Specific Gravity, Urine: 1.018 (ref 1.005–1.030)
pH: 5 (ref 5.0–8.0)

## 2018-06-29 LAB — COMPREHENSIVE METABOLIC PANEL
ALT: 20 U/L (ref 0–44)
AST: 24 U/L (ref 15–41)
Albumin: 4.1 g/dL (ref 3.5–5.0)
Alkaline Phosphatase: 63 U/L (ref 38–126)
Anion gap: 10 (ref 5–15)
BUN: 18 mg/dL (ref 8–23)
CO2: 19 mmol/L — ABNORMAL LOW (ref 22–32)
Calcium: 9.4 mg/dL (ref 8.9–10.3)
Chloride: 107 mmol/L (ref 98–111)
Creatinine, Ser: 1.23 mg/dL (ref 0.61–1.24)
GFR calc Af Amer: >60 mL/min (ref >60)
GFR calc non Af Amer: 52 mL/min — ABNORMAL LOW (ref >60)
Glucose, Bld: 180 mg/dL — ABNORMAL HIGH (ref 70–99)
Potassium: 4.3 mmol/L (ref 3.5–5.1)
Sodium: 136 mmol/L (ref 135–145)
Total Bilirubin: 0.7 mg/dL (ref 0.3–1.2)
Total Protein: 6.2 g/dL — ABNORMAL LOW (ref 6.5–8.1)

## 2018-06-29 LAB — MRSA PCR SCREENING: MRSA by PCR: NEGATIVE

## 2018-06-29 LAB — RESPIRATORY PANEL BY PCR

## 2018-06-29 LAB — CBC WITH DIFFERENTIAL/PLATELET
Abs Immature Granulocytes: 0.06 10*3/uL (ref 0.00–0.07)
Abs Immature Granulocytes: 0.17 10*3/uL — ABNORMAL HIGH (ref 0.00–0.07)
Basophils Absolute: 0.1 10*3/uL (ref 0.0–0.1)
Basophils Absolute: 0.1 10*3/uL (ref 0.0–0.1)
Basophils Relative: 0 %
Basophils Relative: 0 %
Eosinophils Absolute: 0.1 10*3/uL (ref 0.0–0.5)
Eosinophils Absolute: 0.2 10*3/uL (ref 0.0–0.5)
Eosinophils Relative: 0 %
Eosinophils Relative: 1 %
HCT: 32.9 % — ABNORMAL LOW (ref 39.0–52.0)
HCT: 38.4 % — ABNORMAL LOW (ref 39.0–52.0)
Hemoglobin: 11.2 g/dL — ABNORMAL LOW (ref 13.0–17.0)
Hemoglobin: 12.8 g/dL — ABNORMAL LOW (ref 13.0–17.0)
Immature Granulocytes: 0 %
Immature Granulocytes: 1 %
Lymphocytes Relative: 5 %
Lymphocytes Relative: 6 %
Lymphs Abs: 0.9 10*3/uL (ref 0.7–4.0)
Lymphs Abs: 1 10*3/uL (ref 0.7–4.0)
MCH: 30.2 pg (ref 26.0–34.0)
MCH: 30.7 pg (ref 26.0–34.0)
MCHC: 33.3 g/dL (ref 30.0–36.0)
MCHC: 34 g/dL (ref 30.0–36.0)
MCV: 90.1 fL (ref 80.0–100.0)
MCV: 90.6 fL (ref 80.0–100.0)
Monocytes Absolute: 1.1 10*3/uL — ABNORMAL HIGH (ref 0.1–1.0)
Monocytes Absolute: 1.8 10*3/uL — ABNORMAL HIGH (ref 0.1–1.0)
Monocytes Relative: 10 %
Monocytes Relative: 8 %
Neutro Abs: 11.7 10*3/uL — ABNORMAL HIGH (ref 1.7–7.7)
Neutro Abs: 15 10*3/uL — ABNORMAL HIGH (ref 1.7–7.7)
Neutrophils Relative %: 84 %
Neutrophils Relative %: 85 %
Platelets: 155 10*3/uL (ref 150–400)
Platelets: 197 10*3/uL (ref 150–400)
RBC: 3.65 MIL/uL — ABNORMAL LOW (ref 4.22–5.81)
RBC: 4.24 MIL/uL (ref 4.22–5.81)
RDW: 12.3 % (ref 11.5–15.5)
RDW: 12.5 % (ref 11.5–15.5)
WBC: 13.9 10*3/uL — ABNORMAL HIGH (ref 4.0–10.5)
WBC: 18 10*3/uL — ABNORMAL HIGH (ref 4.0–10.5)
nRBC: 0 % (ref 0.0–0.2)
nRBC: 0 % (ref 0.0–0.2)

## 2018-06-29 LAB — LACTIC ACID, PLASMA
Lactic Acid, Venous: 1 mmol/L (ref 0.5–1.9)
Lactic Acid, Venous: 1 mmol/L (ref 0.5–1.9)
Lactic Acid, Venous: 1.1 mmol/L (ref 0.5–1.9)
Lactic Acid, Venous: 1.3 mmol/L (ref 0.5–1.9)

## 2018-06-29 LAB — BASIC METABOLIC PANEL
Anion gap: 11 (ref 5–15)
BUN: 16 mg/dL (ref 8–23)
CO2: 20 mmol/L — ABNORMAL LOW (ref 22–32)
Calcium: 8.5 mg/dL — ABNORMAL LOW (ref 8.9–10.3)
Chloride: 108 mmol/L (ref 98–111)
Creatinine, Ser: 1.05 mg/dL (ref 0.61–1.24)
GFR calc Af Amer: >60 mL/min (ref >60)
GFR calc non Af Amer: >60 mL/min (ref >60)
Glucose, Bld: 150 mg/dL — ABNORMAL HIGH (ref 70–99)
Potassium: 4.2 mmol/L (ref 3.5–5.1)
Sodium: 139 mmol/L (ref 135–145)

## 2018-06-29 LAB — GLUCOSE, CAPILLARY
Glucose-Capillary: 150 mg/dL — ABNORMAL HIGH (ref 70–99)
Glucose-Capillary: 130 mg/dL — ABNORMAL HIGH (ref 70–99)
Glucose-Capillary: 204 mg/dL — ABNORMAL HIGH (ref 70–99)
Glucose-Capillary: 145 mg/dL — ABNORMAL HIGH (ref 70–99)
Glucose-Capillary: 133 mg/dL — ABNORMAL HIGH (ref 70–99)

## 2018-06-29 LAB — PROCALCITONIN: Procalcitonin: 0.11 ng/mL

## 2018-06-29 LAB — SARS CORONAVIRUS 2 BY RT PCR (HOSPITAL ORDER, PERFORMED IN ~~LOC~~ HOSPITAL LAB): SARS Coronavirus 2: NEGATIVE

## 2018-06-29 LAB — CK: Total CK: 380 U/L (ref 49–397)

## 2018-06-29 MED ORDER — HYDROCODONE-ACETAMINOPHEN 5-325 MG PO TABS
1.0000 | ORAL_TABLET | ORAL | Status: DC | PRN
  Administered 2018-06-29: 1 via ORAL
  Filled 2018-06-29: qty 1

## 2018-06-29 MED ORDER — RISAQUAD PO CAPS
1.0000 | ORAL_CAPSULE | Freq: Every day | ORAL | Status: DC
  Administered 2018-06-29 – 2018-07-02 (×4): 1 via ORAL
  Filled 2018-06-29 (×4): qty 1

## 2018-06-29 MED ORDER — ONDANSETRON HCL 4 MG/2ML IJ SOLN: 4.0000 mg | Freq: Four times a day (QID) | INTRAMUSCULAR | Status: DC | PRN

## 2018-06-29 MED ORDER — HYDROCODONE-ACETAMINOPHEN 5-325 MG PO TABS
1.0000 | ORAL_TABLET | Freq: Four times a day (QID) | ORAL | Status: DC | PRN
  Administered 2018-06-29 – 2018-07-02 (×8): 1 via ORAL
  Filled 2018-06-29 (×8): qty 1

## 2018-06-29 MED ORDER — PIPERACILLIN-TAZOBACTAM 3.375 G IVPB
3.3750 g | Freq: Three times a day (TID) | INTRAVENOUS | Status: DC
  Administered 2018-06-29 – 2018-06-30 (×2): 3.375 g via INTRAVENOUS
  Filled 2018-06-29 (×3): qty 50

## 2018-06-29 MED ORDER — INSULIN ASPART 100 UNIT/ML ~~LOC~~ SOLN: 0.0000 [IU] | Freq: Every day | SUBCUTANEOUS | Status: DC

## 2018-06-29 MED ORDER — VANCOMYCIN HCL IN DEXTROSE 1-5 GM/200ML-% IV SOLN
1000.0000 mg | Freq: Once | INTRAVENOUS | Status: AC
  Administered 2018-06-29: 1000 mg via INTRAVENOUS
  Filled 2018-06-29: qty 200

## 2018-06-29 MED ORDER — SODIUM CHLORIDE 0.9 % IV SOLN
2.0000 g | Freq: Two times a day (BID) | INTRAVENOUS | Status: DC
  Administered 2018-06-29: 09:00:00 2 g via INTRAVENOUS
  Filled 2018-06-29 (×2): qty 2

## 2018-06-29 MED ORDER — SENNOSIDES-DOCUSATE SODIUM 8.6-50 MG PO TABS: 1.0000 | ORAL_TABLET | Freq: Every evening | ORAL | Status: DC | PRN

## 2018-06-29 MED ORDER — METRONIDAZOLE IN NACL 5-0.79 MG/ML-% IV SOLN
500.0000 mg | Freq: Three times a day (TID) | INTRAVENOUS | Status: DC
  Administered 2018-06-29 (×2): 500 mg via INTRAVENOUS
  Filled 2018-06-29 (×2): qty 100

## 2018-06-29 MED ORDER — GABAPENTIN 300 MG PO CAPS
300.0000 mg | ORAL_CAPSULE | Freq: Two times a day (BID) | ORAL | Status: DC
  Administered 2018-06-29 – 2018-07-02 (×7): 300 mg via ORAL
  Filled 2018-06-29 (×7): qty 1

## 2018-06-29 MED ORDER — TETANUS-DIPHTH-ACELL PERTUSSIS 5-2.5-18.5 LF-MCG/0.5 IM SUSP
0.5000 mL | Freq: Once | INTRAMUSCULAR | Status: AC
  Administered 2018-06-29: 03:00:00 0.5 mL via INTRAMUSCULAR
  Filled 2018-06-29: qty 0.5

## 2018-06-29 MED ORDER — FAMOTIDINE 10 MG PO TABS
10.0000 mg | ORAL_TABLET | Freq: Every day | ORAL | Status: DC | PRN
  Filled 2018-06-29: qty 1

## 2018-06-29 MED ORDER — MORPHINE SULFATE (PF) 4 MG/ML IV SOLN: 3.0000 mg | INTRAVENOUS | Status: DC | PRN

## 2018-06-29 MED ORDER — ENOXAPARIN SODIUM 40 MG/0.4ML ~~LOC~~ SOLN
40.0000 mg | Freq: Every day | SUBCUTANEOUS | Status: DC
  Administered 2018-06-29 – 2018-07-02 (×4): 40 mg via SUBCUTANEOUS
  Filled 2018-06-29 (×4): qty 0.4

## 2018-06-29 MED ORDER — INSULIN GLARGINE 100 UNIT/ML ~~LOC~~ SOLN
35.0000 [IU] | Freq: Every day | SUBCUTANEOUS | Status: DC
  Administered 2018-06-29 – 2018-07-02 (×4): 35 [IU] via SUBCUTANEOUS
  Filled 2018-06-29 (×4): qty 0.35

## 2018-06-29 MED ORDER — ACETAMINOPHEN 650 MG RE SUPP
650.0000 mg | Freq: Four times a day (QID) | RECTAL | Status: DC | PRN
  Administered 2018-06-29: 650 mg via RECTAL
  Filled 2018-06-29: qty 1

## 2018-06-29 MED ORDER — ACETAMINOPHEN 325 MG PO TABS
650.0000 mg | ORAL_TABLET | Freq: Four times a day (QID) | ORAL | Status: DC | PRN
  Administered 2018-06-29 – 2018-07-02 (×3): 650 mg via ORAL
  Filled 2018-06-29 (×3): qty 2

## 2018-06-29 MED ORDER — TAMSULOSIN HCL 0.4 MG PO CAPS
0.4000 mg | ORAL_CAPSULE | Freq: Every day | ORAL | Status: DC
  Administered 2018-06-29 – 2018-07-02 (×4): 0.4 mg via ORAL
  Filled 2018-06-29 (×4): qty 1

## 2018-06-29 MED ORDER — METOPROLOL TARTRATE 50 MG PO TABS
50.0000 mg | ORAL_TABLET | Freq: Two times a day (BID) | ORAL | Status: DC
  Administered 2018-06-29: 50 mg via ORAL
  Filled 2018-06-29: qty 1

## 2018-06-29 MED ORDER — SODIUM CHLORIDE 0.9 % IV BOLUS
500.0000 mL | Freq: Once | INTRAVENOUS | Status: AC
  Administered 2018-06-29: 14:00:00 500 mL via INTRAVENOUS

## 2018-06-29 MED ORDER — BACID PO TABS
1.0000 | ORAL_TABLET | Freq: Every day | ORAL | Status: DC
  Filled 2018-06-29: qty 1

## 2018-06-29 MED ORDER — SODIUM CHLORIDE 0.9 % IV SOLN
2.0000 g | Freq: Once | INTRAVENOUS | Status: AC
  Administered 2018-06-29: 03:00:00 2 g via INTRAVENOUS
  Filled 2018-06-29: qty 2

## 2018-06-29 MED ORDER — SODIUM CHLORIDE 0.9 % IV BOLUS (SEPSIS)
1000.0000 mL | Freq: Once | INTRAVENOUS | Status: AC
  Administered 2018-06-29: 05:00:00 1000 mL via INTRAVENOUS

## 2018-06-29 MED ORDER — VANCOMYCIN HCL IN DEXTROSE 1-5 GM/200ML-% IV SOLN
1000.0000 mg | INTRAVENOUS | Status: DC
  Administered 2018-06-29 – 2018-07-02 (×3): 1000 mg via INTRAVENOUS
  Filled 2018-06-29 (×5): qty 200

## 2018-06-29 MED ORDER — INSULIN ASPART 100 UNIT/ML ~~LOC~~ SOLN
0.0000 [IU] | Freq: Three times a day (TID) | SUBCUTANEOUS | Status: DC
  Administered 2018-06-29: 09:00:00 1 [IU] via SUBCUTANEOUS
  Administered 2018-06-29: 13:00:00 3 [IU] via SUBCUTANEOUS
  Administered 2018-06-29 – 2018-06-30 (×2): 1 [IU] via SUBCUTANEOUS
  Administered 2018-06-30: 12:00:00 2 [IU] via SUBCUTANEOUS
  Administered 2018-06-30 – 2018-07-02 (×2): 1 [IU] via SUBCUTANEOUS

## 2018-06-29 MED ORDER — SODIUM CHLORIDE 0.9 % IV SOLN
INTRAVENOUS | Status: DC
  Administered 2018-06-29: 06:00:00 via INTRAVENOUS

## 2018-06-29 MED ORDER — ONDANSETRON HCL 4 MG PO TABS: 4.0000 mg | ORAL_TABLET | Freq: Four times a day (QID) | ORAL | Status: DC | PRN

## 2018-06-29 MED ORDER — ALBUTEROL SULFATE (2.5 MG/3ML) 0.083% IN NEBU
2.5000 mg | INHALATION_SOLUTION | RESPIRATORY_TRACT | Status: DC | PRN
  Administered 2018-06-29: 17:00:00 2.5 mg via RESPIRATORY_TRACT
  Filled 2018-06-29: qty 3

## 2018-06-29 MED ORDER — SODIUM CHLORIDE 0.9 % IV SOLN
INTRAVENOUS | Status: DC
  Administered 2018-06-29: 14:00:00 via INTRAVENOUS

## 2018-06-29 MED ORDER — METOPROLOL TARTRATE 12.5 MG HALF TABLET: 12.5000 mg | ORAL_TABLET | Freq: Two times a day (BID) | ORAL | Status: DC

## 2018-06-29 NOTE — Progress Notes (Signed)
Pt arrived to floor from ED. Pt alert and oriented x4. Oriented to room and call bell. 

## 2018-06-29 NOTE — Progress Notes (Addendum)
PROGRESS NOTE   Micheal [name]  TOI:[id number]    DOB: [birth year]    DOA: 06/28/2018  PCP: Gayland Curry, DO   I have briefly reviewed patients previous medical records in [facility].  Brief Narrative:  85 year old male with PMH of GERD, IBS, prostate cancer status post radiation, HLD, PSVT, RLS, incomplete RBBB, DM 2, HTN, chronic lower extremity pain, presented to [facility] ED on 06/28/2018 due to fever, shaking chills and some cough.  Admitted for febrile illness without clear source/SIRS, evaluation ongoing, empirically on IV vancomycin and cefepime.   Assessment & Plan:   Principal Problem:   SIRS (systemic inflammatory response syndrome) (HCC) Active Problems:   Malignant neoplasm of prostate (HCC)   Essential hypertension   DM (diabetes mellitus), type 2 with peripheral vascular complications (HCC)   Chronic cervical radiculopathy   Fever/SIRS  Presented with 2 days history of fever and shaking chills.  Met SIRS criteria on admission.  No clear source clinically or on evaluation thus far.  Neutrophilic [id number] K+.  Lactate normal.  Chest x-ray clear.  UA unremarkable.  SARS coronavirus 2 testing negative.  Hemodynamically stable.  Given some cough and negative work-up thus far, checking RVP.  Empirically started on IV vancomycin, cefepime and Flagyl, continue pending preliminary culture results at least.  MRSA PCR negative and may consider stopping vancomycin.  If work-up negative and fevers persist then consider ID consultation.  Received tetanus booster shot in ED.  Due to soft blood pressures, bolused with 1 L normal saline and increased IV fluids to 125 mils per hour.  Received 1 L saline bolus in ED.  Type II DM/IDDM  Latest A1c: 7.8.  Currently on Lantus 35 units daily and NovoLog SSI.  Mildly uncontrolled.  Monitor closely and adjust insulins as needed.  Essential hypertension  Soft blood pressures this afternoon 87/45.  Reduced metoprolol to  12.5 mg twice daily with holding parameters.  Prostate cancer status post radiation/BPH  Continue Flomax.  Outpatient follow-up with urology, reportedly has had low PSAs.  GERD  Continue Pepcid  Anemia  Follow CBCs.  Acute kidney injury  Possibly due to acute febrile illness and insensible fluid losses.  Resolved after IV fluids.  Continue gentle IV fluid hydration.   DVT prophylaxis: Lovenox Code Status: Full Family Communication: Discussed extensively with patient's daughter, updated care and answered questions. Disposition: To be determined pending clinical improvement.  Patient had fever of 101.8 at 5 AM today.  Etiology of his febrile illness is still unknown.  He will need to be evaluated in the hospital for same as outlined above, monitored closely and may need additional work-up.  Also he remains on broad-spectrum IV antibiotics.  It is expected that he will stay at least 2 midnights for above to ensure that he is appropriately treated and stabilized prior to safe discharge home.  Consultants:  None  Procedures:  None  Antimicrobials:  IV cefepime, vancomycin and Flagyl   Subjective: Reports ongoing shaking chills.  No fevers this morning.  Chronic pain waist down which he reports that he has had for several months.  Denies any other complaints.  As per RN, no acute issues noted.  ROS: As above, otherwise negative.  Objective:  Vitals:   06/29/18 0624 06/29/18 0700 06/29/18 0847 06/29/18 1040  BP:  (!) 123/53 (!) 118/56   Pulse:  (!) 102 (!) 102   Resp:  18 20   Temp: 98.6 F (37 C) 99.8 F (37.7 C) 98.5 F (  36.9 C) 99.5 F (37.5 C)  TempSrc: Oral Oral Oral   SpO2:  96% 97%   Weight:      Height:        Examination:  General exam: Pleasant elderly male, moderately built and nourished lying comfortably propped up in bed.  Oral mucosa with borderline hydration. Respiratory system: Clear to auscultation. Respiratory effort normal. Cardiovascular  system: S1 & S2 heard, RRR. No JVD, murmurs, rubs, gallops or clicks. No pedal edema. Gastrointestinal system: Abdomen is nondistended, soft and nontender. No organomegaly or masses felt. Normal bowel sounds heard. Central nervous system: Alert and oriented. No focal neurological deficits. Extremities: Symmetric 5 x 5 power. Skin: Lateral aspect of left lower leg noted tiny area of superficial puncture/abrasion without acute findings or drainage.  No fluctuation. Psychiatry: Judgement and insight appear normal. Mood & affect appropriate.     Data Reviewed: I have personally reviewed following labs and imaging studies  CBC: Recent Labs  Lab 06/28/18 2330 06/29/18 0559  WBC 13.9* 18.0*  NEUTROABS 11.7* 15.0*  HGB 12.8* 11.2*  HCT 38.4* 32.9*  MCV 90.6 90.1  PLT 197 889   Basic Metabolic Panel: Recent Labs  Lab 06/28/18 2330 06/29/18 0559  NA 136 139  K 4.3 4.2  CL 107 108  CO2 19* 20*  GLUCOSE 180* 150*  BUN 18 16  CREATININE 1.23 1.05  CALCIUM 9.4 8.5*   Liver Function Tests: Recent Labs  Lab 06/28/18 2330  AST 24  ALT 20  ALKPHOS 63  BILITOT 0.7  PROT 6.2*  ALBUMIN 4.1   CBG: Recent Labs  Lab 06/29/18 0511 06/29/18 0814 06/29/18 1214  GLUCAP 150* 130* 204*    Recent Results (from the past 240 hour(s))  SARS Coronavirus 2 (CEPHEID- Performed in [place] hospital lab), Hosp Order     Status: None   Collection Time: 06/29/18  1:57 AM  Result Value Ref Range Status   SARS Coronavirus 2 NEGATIVE NEGATIVE Final    Comment: (NOTE) If result is NEGATIVE SARS-CoV-2 target nucleic acids are NOT DETECTED. The SARS-CoV-2 RNA is generally detectable in upper and lower  respiratory specimens during the acute phase of infection. The lowest  concentration of SARS-CoV-2 viral copies this assay can detect is 250  copies / mL. A negative result does not preclude SARS-CoV-2 infection  and should not be used as the sole basis for treatment or other  patient  management decisions.  A negative result may occur with  improper specimen collection / handling, submission of specimen other  than nasopharyngeal swab, presence of viral mutation(s) within the  areas targeted by this assay, and inadequate number of viral copies  (<250 copies / mL). A negative result must be combined with clinical  observations, patient history, and epidemiological information. If result is POSITIVE SARS-CoV-2 target nucleic acids are DETECTED. The SARS-CoV-2 RNA is generally detectable in upper and lower  respiratory specimens dur ing the acute phase of infection.  Positive  results are indicative of active infection with SARS-CoV-2.  Clinical  correlation with patient history and other diagnostic information is  necessary to determine patient infection status.  Positive results do  not rule out bacterial infection or co-infection with other viruses. If result is PRESUMPTIVE POSTIVE SARS-CoV-2 nucleic acids MAY BE PRESENT.   A presumptive positive result was obtained on the submitted specimen  and confirmed on repeat testing.  While 2019 novel coronavirus  (SARS-CoV-2) nucleic acids may be present in the submitted sample  additional confirmatory  testing may be necessary for epidemiological  and / or clinical management purposes  to differentiate between  SARS-CoV-2 and other Sarbecovirus currently known to infect humans.  If clinically indicated additional testing with an alternate test  methodology [phone]) is advised. The SARS-CoV-2 RNA is generally  detectable in upper and lower respiratory sp ecimens during the acute  phase of infection. The expected result is Negative. Fact Sheet for Patients:  StrictlyIdeas.no Fact Sheet for Healthcare Providers: BankingDealers.co.za This test is not yet approved or cleared by the [place] FDA and has been authorized for detection and/or diagnosis of SARS-CoV-2 by FDA under  an Emergency Use Authorization (EUA).  This EUA will remain in effect (meaning this test can be used) for the duration of the COVID-19 declaration under Section 564(b)(1) of the Act, 21 U.S.C. section 360bbb-3(b)(1), unless the authorization is terminated or revoked sooner. Performed at [facility], [place] [address] [place], [place] [address]   MRSA PCR Screening     Status: None   Collection Time: 06/29/18 10:50 AM  Result Value Ref Range Status   MRSA by PCR NEGATIVE NEGATIVE Final    Comment:        The GeneXpert MRSA Assay (FDA approved for NASAL specimens only), is one component of a comprehensive MRSA colonization surveillance program. It is not intended to diagnose MRSA infection nor to guide or monitor treatment for MRSA infections. Performed at [facility], [place] [address], [place], [place] [address]          Radiology Studies: Dg Chest 2 View  Result Date: 06/29/2018 CLINICAL DATA:  85 year old male with fever. EXAM: CHEST - 2 VIEW COMPARISON:  Chest radiograph dated 02/25/2015 FINDINGS: The heart size and mediastinal contours are within normal limits. Both lungs are clear. The visualized skeletal structures are unremarkable. IMPRESSION: No active cardiopulmonary disease. Electronically Signed   By: Anner Crete M.D.   On: 06/29/2018 01:45        Scheduled Meds: . enoxaparin (LOVENOX) injection  40 mg Subcutaneous Daily  . gabapentin  300 mg Oral BID  . insulin aspart  0-5 Units Subcutaneous QHS  . insulin aspart  0-9 Units Subcutaneous TID WC  . insulin glargine  35 Units Subcutaneous Daily  . metoprolol tartrate  50 mg Oral BID  . tamsulosin  0.4 mg Oral Daily   Continuous Infusions: . sodium chloride 100 mL/hr at 06/29/18 0622  . ceFEPime (MAXIPIME) IV 2 g (06/29/18 0845)  . metronidazole 500 mg (06/29/18 1301)  . vancomycin       LOS: 0 days     Vernell Leep, MD, FACP, [facility]. [facility]  To contact the attending  provider between 7A-7P or the covering provider during after hours 7P-7A, please log into the web site [email or web address] and access using universal [place] password for that web site. If you do not have the password, please call the hospital operator.  06/29/2018, 1:39 PM

## 2018-06-29 NOTE — ED Notes (Signed)
ED TO INPATIENT HANDOFF REPORT  ED Nurse Name and Phone #: [phone] Lucita Ferrara Name/Age/Gender Micheal [name] 85 y.o. male Room/Bed: 035C/035C  Code Status   Code Status: Not on file  Home/SNF/Other Home Patient oriented to: self, place, time and situation Is this baseline? Yes   Triage Complete: Triage complete  Chief Complaint Left Leg Lac  Triage Note Pt states that he began to feel bad tonight having chills and fatigue, fevers, pt states that he cut his leg today while he was mowing the lawn, small abrasion to L leg. Denies cough or SOB, unknown last tetanus, pt took tylenol PTA    Allergies Allergies  Allergen Reactions  . Aspirin Other (See Comments)    Bothers stomach, thins blood   . Atorvastatin Other (See Comments)    Muscular pain and weakness  . Prednisone Other (See Comments)    Upset stomach, can take by shot not by mouth  . Simvastatin Other (See Comments)    Muscular pain and weakness  . Sulfa Antibiotics Other (See Comments)    Upset stomach   . Latex Rash  . Metformin Hcl Nausea Only  . Voltaren [Diclofenac Sodium] Other (See Comments)    Unknown    Level of Care/Admitting Diagnosis ED Disposition    ED Disposition Condition [facility] Area: [place] [[id number]]  Level of Care: Med-Surg [16]  I expect the patient will be discharged within 24 hours: Yes  LOW acuity---Tx typically complete <24 hrs---ACUTE conditions typically can be evaluated <24 hours---LABS likely to return to acceptable levels <24 hours---IS near functional baseline---EXPECTED to return to current living arrangement---NOT newly hypoxic: Does not meet criteria for 5C-Observation unit  Covid Evaluation: Confirmed COVID Negative  Diagnosis: SIRS (systemic inflammatory response syndrome) ([place]) [956213]  Admitting Physician: Vianne Bulls [[id number]]  Attending Physician: Vianne Bulls [[id number]]  PT Class (Do Not Modify): Observation [104]  PT Acc  Code (Do Not Modify): Observation [10022]       B Medical/Surgery History Past Medical History:  Diagnosis Date  . Allergic rhinitis, cause unspecified   . Carpal tunnel syndrome   . Cervical spondylosis without myelopathy   . Cervical spondylosis without myelopathy   . Cervicalgia   . Diverticulosis of colon (without mention of hemorrhage)   . Esophageal reflux   . Hypertrophy of prostate with urinary obstruction and other lower urinary tract symptoms (LUTS)   . Insomnia, unspecified   . Internal hemorrhoids without mention of complication   . Irritable bowel syndrome   . Macular degeneration (senile) of retina, unspecified   . Malignant neoplasm of prostate ([place])   . Neurogenic bladder, NOS   . Neuropathy   . Nonspecific (abnormal) findings on radiological and other examination of gastrointestinal tract   . Orthostatic hypotension   . Osteoarthrosis, unspecified whether generalized or localized, unspecified site   . Other and unspecified hyperlipidemia   . Other malaise and fatigue   . Other specified disorder of rectum and anus    Radiation Proctitus  . Paroxysmal supraventricular tachycardia ([place])   . Restless legs syndrome (RLS)   . Retinal detachment with retinal defect, unspecified   . Right bundle branch block    Incomplete  . Spermatocele    Right  . Trigger finger (acquired)   . Type II or unspecified type diabetes mellitus without mention of complication, not stated as uncontrolled   . Type II or unspecified type diabetes mellitus without mention of  complication, uncontrolled   . Unspecified essential hypertension    patient denies, states he has a "fast heart rate, but no high BP"  . Vitamin D deficiency    Past Surgical History:  Procedure Laterality Date  . Angiolipoma  1980   Right arm  . APPENDECTOMY  1951  . CHOLECYSTECTOMY  01/1994  . COLONOSCOPY  04/11/2010  . COLONOSCOPY W/ POLYPECTOMY  2005   Removed 2 (two) polyps  . DUPUYTREN CONTRACTURE  RELEASE Bilateral 1989  . EYE SURGERY  2006   Left  . EYE SURGERY  2006   Retina reattachment, right  . EYE SURGERY  02/1994   Cataract surgery, right  . EYE SURGERY  01/1995   Cataract surgery, left  . KNEE SURGERY  1979  . PARATHYROIDECTOMY Left 12/27/2016   Procedure: LEFT INFERIOR PARATHYROIDECTOMY;  Surgeon: Armandina Gemma, MD;  Location: [facility] ORS;  Service: General;  Laterality: Left;  . POLYPECTOMY  1955   Vocal cord  . RETINAL DETACHMENT SURGERY Right 2005  . SHOULDER ARTHROSCOPY Right 03/22/15   [place]  . TENDON RELEASE  01/1997   seven fingers  . TRANSURETHRAL RESECTION OF PROSTATE  08/1989  . VITRECTOMY Left 04/23/2013   [facility]     A IV Location/Drains/Wounds Patient Lines/Drains/Airways Status   Active Line/Drains/Airways    Name:   Placement date:   Placement time:   Site:   Days:   Peripheral IV 06/29/18 Left Antecubital   06/29/18    -    Antecubital   less than 1   Incision (Closed) 12/27/16 Neck Other (Comment)   12/27/16    0920     549          Intake/Output Last 24 hours No intake or output data in the 24 hours ending 06/29/18 0357  Labs/Imaging Results for orders placed or performed during the hospital encounter of 06/28/18 (from the past 48 hour(s))  Lactic acid, plasma     Status: None   Collection Time: 06/28/18 11:30 PM  Result Value Ref Range   Lactic Acid, Venous 1.3 0.5 - 1.9 mmol/L    Comment: Performed at [facility], [address] [place], [place] [address]  Comprehensive metabolic panel     Status: Abnormal   Collection Time: 06/28/18 11:30 PM  Result Value Ref Range   Sodium 136 135 - 145 mmol/L   Potassium 4.3 3.5 - 5.1 mmol/L   Chloride 107 98 - 111 mmol/L   CO2 19 (L) 22 - 32 mmol/L   Glucose, Bld 180 (H) 70 - 99 mg/dL   BUN 18 8 - 23 mg/dL   Creatinine, Ser 1.23 0.61 - 1.24 mg/dL   Calcium 9.4 8.9 - 10.3 mg/dL   Total Protein 6.2 (L) 6.5 - 8.1 g/dL   Albumin 4.1 3.5 - 5.0 g/dL   AST 24 15 - 41 U/L   ALT 20 0 - 44  U/L   Alkaline Phosphatase 63 38 - 126 U/L   Total Bilirubin 0.7 0.3 - 1.2 mg/dL   GFR calc non Af Amer 52 (L) >60 mL/min   GFR calc Af Amer >60 >60 mL/min   Anion gap 10 5 - 15    Comment: Performed at [facility] [address] [place], [place] [address]  CBC with Differential     Status: Abnormal   Collection Time: 06/28/18 11:30 PM  Result Value Ref Range   WBC 13.9 (H) 4.0 - 10.5 K/uL   RBC  4.24 4.22 - 5.81 MIL/uL   Hemoglobin 12.8 (L) 13.0 - 17.0 g/dL   HCT 38.4 (L) 39.0 - 52.0 %   MCV 90.6 80.0 - 100.0 fL   MCH 30.2 26.0 - 34.0 pg   MCHC 33.3 30.0 - 36.0 g/dL   RDW 12.3 11.5 - 15.5 %   Platelets 197 150 - 400 K/uL   nRBC 0.0 0.0 - 0.2 %   Neutrophils Relative % 85 %   Neutro Abs 11.7 (H) 1.7 - 7.7 K/uL   Lymphocytes Relative 6 %   Lymphs Abs 0.9 0.7 - 4.0 K/uL   Monocytes Relative 8 %   Monocytes Absolute 1.1 (H) 0.1 - 1.0 K/uL   Eosinophils Relative 1 %   Eosinophils Absolute 0.2 0.0 - 0.5 K/uL   Basophils Relative 0 %   Basophils Absolute 0.1 0.0 - 0.1 K/uL   Immature Granulocytes 0 %   Abs Immature Granulocytes 0.06 0.00 - 0.07 K/uL    Comment: Performed at [facility] [address] [place], [place] [address]  Urinalysis, Routine w reflex microscopic     Status: None   Collection Time: 06/28/18 11:43 PM  Result Value Ref Range   Color, Urine YELLOW YELLOW   APPearance CLEAR CLEAR   Specific Gravity, Urine 1.018 1.005 - 1.030   pH 5.0 5.0 - 8.0   Glucose, UA NEGATIVE NEGATIVE mg/dL   Hgb urine dipstick NEGATIVE NEGATIVE   Bilirubin Urine NEGATIVE NEGATIVE   Ketones, ur NEGATIVE NEGATIVE mg/dL   Protein, ur NEGATIVE NEGATIVE mg/dL   Nitrite NEGATIVE NEGATIVE   Leukocytes,Ua NEGATIVE NEGATIVE    Comment: Performed at [facility] [address], [place], [place] [address]  SARS Coronavirus 2 (CEPHEID- Performed in [place] hospital lab), Hosp Order     Status: None   Collection Time: 06/29/18  1:57 AM  Result Value Ref Range    SARS Coronavirus 2 NEGATIVE NEGATIVE    Comment: (NOTE) If result is NEGATIVE SARS-CoV-2 target nucleic acids are NOT DETECTED. The SARS-CoV-2 RNA is generally detectable in upper and lower  respiratory specimens during the acute phase of infection. The lowest  concentration of SARS-CoV-2 viral copies this assay can detect is 250  copies / mL. A negative result does not preclude SARS-CoV-2 infection  and should not be used as the sole basis for treatment or other  patient management decisions.  A negative result may occur with  improper specimen collection / handling, submission of specimen other  than nasopharyngeal swab, presence of viral mutation(s) within the  areas targeted by this assay, and inadequate number of viral copies  (<250 copies / mL). A negative result must be combined with clinical  observations, patient history, and epidemiological information. If result is POSITIVE SARS-CoV-2 target nucleic acids are DETECTED. The SARS-CoV-2 RNA is generally detectable in upper and lower  respiratory specimens dur ing the acute phase of infection.  Positive  results are indicative of active infection with SARS-CoV-2.  Clinical  correlation with patient history and other diagnostic information is  necessary to determine patient infection status.  Positive results do  not rule out bacterial infection or co-infection with other viruses. If result is PRESUMPTIVE POSTIVE SARS-CoV-2 nucleic acids MAY BE PRESENT.   A presumptive positive result was obtained on the submitted specimen  and confirmed on repeat testing.  While 2019 novel coronavirus  (SARS-CoV-2) nucleic acids may be present in the submitted sample  additional confirmatory testing may be necessary for  epidemiological  and / or clinical management purposes  to differentiate between  SARS-CoV-2 and other Sarbecovirus currently known to infect humans.  If clinically indicated additional testing with an alternate test   methodology [phone]) is advised. The SARS-CoV-2 RNA is generally  detectable in upper and lower respiratory sp ecimens during the acute  phase of infection. The expected result is Negative. Fact Sheet for Patients:  StrictlyIdeas.no Fact Sheet for Healthcare Providers: BankingDealers.co.za This test is not yet approved or cleared by the [place] FDA and has been authorized for detection and/or diagnosis of SARS-CoV-2 by FDA under an Emergency Use Authorization (EUA).  This EUA will remain in effect (meaning this test can be used) for the duration of the COVID-19 declaration under Section 564(b)(1) of the Act, 21 U.S.C. section 360bbb-3(b)(1), unless the authorization is terminated or revoked sooner. Performed at [facility], [place] [address] [place], [place] [address]    Dg Chest 2 View  Result Date: 06/29/2018 CLINICAL DATA:  85 year old male with fever. EXAM: CHEST - 2 VIEW COMPARISON:  Chest radiograph dated 02/25/2015 FINDINGS: The heart size and mediastinal contours are within normal limits. Both lungs are clear. The visualized skeletal structures are unremarkable. IMPRESSION: No active cardiopulmonary disease. Electronically Signed   By: Anner Crete M.D.   On: 06/29/2018 01:45    Pending Labs Unresulted Labs (From admission, onward)    Start     Ordered   06/29/18 0200  Blood culture (routine x 2)  BLOOD CULTURE X 2,   STAT     06/29/18 0159          Vitals/Pain Today's Vitals   06/29/18 0230 06/29/18 0245 06/29/18 0315 06/29/18 0330  BP: 132/74 (!) 118/53 (!) 121/57 (!) 111/59  Pulse: 100  99 (!) 101  Resp: (!) 21 18 (!) 28 (!) 26  Temp:      TempSrc:      SpO2: 97%  95% 96%  PainSc:        Isolation Precautions No active isolations  Medications Medications  vancomycin (VANCOCIN) IVPB 1000 mg/200 mL premix (has no administration in time range)  Tdap (BOOSTRIX) injection 0.5 mL (0.5 mLs  Intramuscular Given 06/29/18 0248)  ceFEPIme (MAXIPIME) 2 g in sodium chloride 0.9 % 100 mL IVPB (0 g Intravenous Stopped 06/29/18 0340)    Mobility walks with person assist Low fall risk   Focused Assessments Pulmonary Assessment Handoff:  Lung sounds: Bilateral Breath Sounds: (S) Diminished, Other (Comment)(coarse) O2 Device: Room Air        R Recommendations: See Admitting Provider Note  Report given to:   Additional Notes: N/A

## 2018-06-29 NOTE — ED Notes (Signed)
Updated pt's wife about pt's admission

## 2018-06-29 NOTE — Plan of Care (Signed)
  Problem: Safety: Goal: Ability to remain free from injury will improve Outcome: Progressing   Problem: Skin Integrity: Goal: Risk for impaired skin integrity will decrease Outcome: Progressing   

## 2018-06-29 NOTE — Progress Notes (Signed)
Notified Wife, Iyad Deroo, that patient will transfer to [facility]

## 2018-06-29 NOTE — ED Provider Notes (Signed)
TIME SEEN: 1:58 AM  CHIEF COMPLAINT: Fever, generalized weakness  HPI: Patient is an 85 year old male with history of hypertension, diabetes who presents to the emergency department with fever that started yesterday.  Reports he had chills and his wife checked his temperature and it was 101.  States he has been feeling very weak but contributed this to being started on gabapentin recently for neuropathy.  He denies any headache, neck pain or neck stiffness.  No chest pain, shortness of breath or cough.  No abdominal pain, nausea, vomiting or diarrhea.  No rash.  Does have a puncture wound to the left posterior distal calf that he reports occurred 2 days ago when he was using a rake and accidentally punctured his leg with a rake.  He is unsure of his last tetanus vaccination.  There has not been drainage from this area.  No tick bites or recent travel.  He has been isolating at home.  States his family members will get his groceries.  He does live with his wife he reports she has had a cough for the past 3 to 4 months.  ROS: See HPI Constitutional:  fever  Eyes: no drainage  ENT: no runny nose   Cardiovascular:  no chest pain  Resp: no SOB  GI: no vomiting GU: no dysuria Integumentary: no rash  Allergy: no hives  Musculoskeletal: no leg swelling  Neurological: no slurred speech ROS otherwise negative  PAST MEDICAL HISTORY/PAST SURGICAL HISTORY:  Past Medical History:  Diagnosis Date  . Allergic rhinitis, cause unspecified   . Carpal tunnel syndrome   . Cervical spondylosis without myelopathy   . Cervical spondylosis without myelopathy   . Cervicalgia   . Diverticulosis of colon (without mention of hemorrhage)   . Esophageal reflux   . Hypertrophy of prostate with urinary obstruction and other lower urinary tract symptoms (LUTS)   . Insomnia, unspecified   . Internal hemorrhoids without mention of complication   . Irritable bowel syndrome   . Macular degeneration (senile) of retina,  unspecified   . Malignant neoplasm of prostate ([place])   . Neurogenic bladder, NOS   . Neuropathy   . Nonspecific (abnormal) findings on radiological and other examination of gastrointestinal tract   . Orthostatic hypotension   . Osteoarthrosis, unspecified whether generalized or localized, unspecified site   . Other and unspecified hyperlipidemia   . Other malaise and fatigue   . Other specified disorder of rectum and anus    Radiation Proctitus  . Paroxysmal supraventricular tachycardia ([place])   . Restless legs syndrome (RLS)   . Retinal detachment with retinal defect, unspecified   . Right bundle branch block    Incomplete  . Spermatocele    Right  . Trigger finger (acquired)   . Type II or unspecified type diabetes mellitus without mention of complication, not stated as uncontrolled   . Type II or unspecified type diabetes mellitus without mention of complication, uncontrolled   . Unspecified essential hypertension    patient denies, states he has a "fast heart rate, but no high BP"  . Vitamin D deficiency     MEDICATIONS:  Prior to Admission medications   Medication Sig Start Date End Date Taking? Authorizing Provider  acetaminophen (TYLENOL) 500 MG tablet Take 1,000 mg by mouth 3 (three) times daily.    [provider]  alum & mag hydroxide-simeth (MAALOX/MYLANTA) 200-200-20 MG/5ML suspension Take 30 mLs by mouth as needed for indigestion or heartburn.    [provider]  cholecalciferol (VITAMIN D) 400 units TABS tablet Take 400 Units by mouth daily.    [provider]  docusate sodium (COLACE) 100 MG capsule Take 100 mg by mouth daily.    [provider]  famotidine (PEPCID AC) 10 MG chewable tablet Chew 10 mg by mouth daily as needed for heartburn.    [provider]  gabapentin (NEURONTIN) 300 MG capsule Take 1 capsule (300 mg total) by mouth 2 (two) times daily. 06/23/18   Reed, Tiffany L, DO  Insulin Glargine (LANTUS SOLOSTAR) 100  UNIT/ML Solostar Pen INJECT 45 UNITS  SUBCUTANEOUSLY IN THE  MORNING 11/15/17   Reed, Tiffany L, DO  Insulin Isophane & Regular Human (HUMULIN 70/30 MIX) (70-30) 100 UNIT/ML PEN Inject 22 Units into the skin every evening.    [provider]  Insulin Pen Needle (B-D ULTRAFINE III SHORT PEN) 31G X 8 MM MISC Use twice daily for insulin 02/03/18   Reed, Tiffany L, DO  loratadine (CLARITIN) 10 MG tablet Take 10 mg by mouth daily.    [provider]  metoprolol tartrate (LOPRESSOR) 50 MG tablet TAKE 1 TABLET BY MOUTH TWO  TIMES DAILY 04/21/18   Reed, Tiffany L, DO  nystatin-triamcinolone (MYCOLOG II) cream Apply topically 4 (four) times daily. 08/19/17   Reed, Tiffany L, DO  Probiotic Product (PROBIOTIC-10) CHEW Chew 1 each by mouth daily.     [provider]  tamsulosin (FLOMAX) 0.4 MG CAPS capsule TAKE 1 CAPSULE BY MOUTH  DAILY FOR PROSTATE 06/02/18   Reed, Tiffany L, DO  trolamine salicylate (ASPERCREME) 10 % cream Apply 1 application topically as needed for muscle pain.    [provider]    ALLERGIES:  Allergies  Allergen Reactions  . Aspirin Other (See Comments)    Bothers stomach, thins blood   . Atorvastatin Other (See Comments)    Muscular pain and weakness  . Prednisone Other (See Comments)    Upset stomach, can take by shot not by mouth  . Simvastatin Other (See Comments)    Muscular pain and weakness  . Sulfa Antibiotics Other (See Comments)    Upset stomach   . Latex Rash  . Metformin Hcl Nausea Only  . Voltaren [Diclofenac Sodium] Other (See Comments)    Unknown    SOCIAL HISTORY:  Social History   Tobacco Use  . Smoking status: Never Smoker  . Smokeless tobacco: Never Used  Substance Use Topics  . Alcohol use: No    Alcohol/week: 0.0 standard drinks    FAMILY HISTORY: Family History  Problem Relation Age of Onset  . Depression Mother   . Diabetes Mother   . Mental illness Mother        OCD  . Cerebral aneurysm Father         age 39  . Hypertension Sister   . Diabetes Daughter   . Hyperlipidemia Daughter   . Hyperparathyroidism Neg Hx     EXAM: BP 108/85 (BP Location: Right Arm)   Pulse 100   Temp 98.9 F (37.2 C) (Oral)   Resp (!) 25   SpO2 100%  CONSTITUTIONAL: Alert and oriented and responds appropriately to questions. Well-appearing; well-nourished, elderly, in no distress HEAD: Normocephalic EYES: Conjunctivae clear, pupils appear equal, EOMI ENT: normal nose; moist mucous membranes NECK: Supple, no meningismus, no nuchal rigidity, no LAD  CARD: Regular and intermittently tachycardic; S1 and S2 appreciated; no murmurs, no clicks, no rubs, no gallops RESP: Normal chest excursion without splinting or tachypnea; breath  sounds clear and equal bilaterally; no wheezes, no rhonchi, no rales, no hypoxia or respiratory distress, speaking full sentences ABD/GI: Normal bowel sounds; non-distended; soft, non-tender, no rebound, no guarding, no peritoneal signs, no hepatosplenomegaly BACK:  The back appears normal and is non-tender to palpation, there is no CVA tenderness EXT: Normal ROM in all joints; non-tender to palpation; no edema; normal capillary refill; no cyanosis, no calf tenderness or swelling    SKIN: Normal color for age and race; warm; no rash, small 5 mm puncture wound to the distal left posterior calf that is tender to palpation but appears to be superficial without surrounding redness or warmth or induration.  There is no fluctuance noted.  No bleeding or drainage noted. NEURO: Moves all extremities equally PSYCH: The patient's mood and manner are appropriate. Grooming and personal hygiene are appropriate.  MEDICAL DECISION MAKING: Patient here with complaints of fever and weakness.  His work-up thus far has been unremarkable other than mild leukocytosis of 13,000 with left shift.  He has been mildly tachycardic.  Chest x-ray clear.  Urine shows no sign of infection.  Will obtain blood cultures and  coronavirus screening.  Patient meets sirs criteria.  Will give broad-spectrum antibiotics.  Patient reports he took Tylenol prior to arrival.  He does appear very short of breath with just minimal ambulation in the room and becomes tachypneic into the 40s.  No hypoxia.  ED PROGRESS: COVID test is negative.  Will admit for fever of unknown origin meeting Sirs criteria.  Blood cultures are pending.  3:39 AM Discussed patient's case with hospitalist, Dr. Myna Hidalgo.  I have recommended admission and patient (and family if present) agree with this plan. Admitting physician will place admission orders.   I reviewed all nursing notes, vitals, pertinent previous records, EKGs, lab and urine results, imaging (as available).    EKG Interpretation  Date/Time:  Sunday June 29 2018 01:55:08 EDT Ventricular Rate:  100 PR Interval:    QRS Duration: 131 QT Interval:  372 QTC Calculation: 480 R Axis:   81 Text Interpretation:  Sinus tachycardia Right bundle branch block Borderline ST elevation, lateral leads No significant change since last tracing Confirmed by Marizol Borror, Cyril Mourning [phone]) on 06/29/2018 1:58:38 AM          Amberlee Garvey, Delice Bison, DO 06/29/18 [id number]

## 2018-06-29 NOTE — Progress Notes (Signed)
Asked Dr. Algis Liming if needed ECHO to be stat and have cardiologist read it tonight but he said no, informed tech and she said it will be done tomorrow.

## 2018-06-29 NOTE — H&P (Signed)
History and Physical    Micheal [name] BPZ:[id number] DOB: 1928/02/03 DOA: 06/28/2018  PCP: Gayland Curry, DO   Patient coming from: Home   Chief Complaint: Fever, chills   HPI: Micheal [name] is a 85 y.o. male with medical history significant for osteoarthritis, chronic cervical radiculopathy, prostate cancer status post radiation, hypertension, and insulin-dependent diabetes mellitus, now presenting to the emergency department for evaluation of fever and shaking chills.  The patient reports that he had been in his usual state of health until yesterday when he developed chills, so severe that he was shaking.  He has continued to have intermittent fevers and chills since that time.  He has a cough that he attributes to his seasonal allergies and denies any shortness of breath.  He denies abdominal pain, vomiting, or diarrhea.  No neck stiffness, headache, or focal numbness or weakness.  He denies dysuria or flank pain.  No rhinorrhea or sore throat.  Denies travel or sick contacts.  He has some small abrasions to the left lower leg after being scraped with a tool while doing yard work at home couple days ago, but has not had any drainage from that.  He has been experiencing a lot of pain involving his bilateral knees, but this seems to be chronic, followed by orthopedic surgery for this, and there has not been any redness or swelling of the joints.  ED Course: Upon arrival to the ED, patient is found to be febrile to 38.3 C, saturating 100% on room air, slightly tachypneic, tachycardic to 110, and with stable blood pressure.  EKG features sinus tachycardia with rate 100, RBBB, and ST abnormality that appears similar to priors.  Chest x-ray is negative for acute cardiopulmonary disease.  Urinalysis is not suggestive of an infection, COVID-19 testing is negative, and lactic acid is reassuringly normal.  CBC is notable for leukocytosis to 13,900.  Chemistry panel is unremarkable.  Blood cultures were  collected in the emergency department and the patient was treated with vancomycin and cefepime.  He will be observed for ongoing evaluation and management of SIRS.   Review of Systems:  All other systems reviewed and apart from HPI, are negative.  Past Medical History:  Diagnosis Date  . Allergic rhinitis, cause unspecified   . Carpal tunnel syndrome   . Cervical spondylosis without myelopathy   . Cervical spondylosis without myelopathy   . Cervicalgia   . Diverticulosis of colon (without mention of hemorrhage)   . Esophageal reflux   . Hypertrophy of prostate with urinary obstruction and other lower urinary tract symptoms (LUTS)   . Insomnia, unspecified   . Internal hemorrhoids without mention of complication   . Irritable bowel syndrome   . Macular degeneration (senile) of retina, unspecified   . Malignant neoplasm of prostate ([place])   . Neurogenic bladder, NOS   . Neuropathy   . Nonspecific (abnormal) findings on radiological and other examination of gastrointestinal tract   . Orthostatic hypotension   . Osteoarthrosis, unspecified whether generalized or localized, unspecified site   . Other and unspecified hyperlipidemia   . Other malaise and fatigue   . Other specified disorder of rectum and anus    Radiation Proctitus  . Paroxysmal supraventricular tachycardia ([place])   . Restless legs syndrome (RLS)   . Retinal detachment with retinal defect, unspecified   . Right bundle branch block    Incomplete  . Spermatocele    Right  . Trigger finger (acquired)   . Type II  or unspecified type diabetes mellitus without mention of complication, not stated as uncontrolled   . Type II or unspecified type diabetes mellitus without mention of complication, uncontrolled   . Unspecified essential hypertension    patient denies, states he has a "fast heart rate, but no high BP"  . Vitamin D deficiency     Past Surgical History:  Procedure Laterality Date  . Angiolipoma  1980   Right  arm  . APPENDECTOMY  1951  . CHOLECYSTECTOMY  01/1994  . COLONOSCOPY  04/11/2010  . COLONOSCOPY W/ POLYPECTOMY  2005   Removed 2 (two) polyps  . DUPUYTREN CONTRACTURE RELEASE Bilateral 1989  . EYE SURGERY  2006   Left  . EYE SURGERY  2006   Retina reattachment, right  . EYE SURGERY  02/1994   Cataract surgery, right  . EYE SURGERY  01/1995   Cataract surgery, left  . KNEE SURGERY  1979  . PARATHYROIDECTOMY Left 12/27/2016   Procedure: LEFT INFERIOR PARATHYROIDECTOMY;  Surgeon: Armandina Gemma, MD;  Location: [facility];  Service: General;  Laterality: Left;  . POLYPECTOMY  1955   Vocal cord  . RETINAL DETACHMENT SURGERY Right 2005  . SHOULDER ARTHROSCOPY Right 03/22/15   [place]  . TENDON RELEASE  01/1997   seven fingers  . TRANSURETHRAL RESECTION OF PROSTATE  08/1989  . VITRECTOMY Left 04/23/2013   [facility]     reports that he has never smoked. He has never used smokeless tobacco. He reports that he does not drink alcohol or use drugs.  Allergies  Allergen Reactions  . Aspirin Other (See Comments)    Bothers stomach, thins blood   . Atorvastatin Other (See Comments)    Muscular pain and weakness  . Prednisone Other (See Comments)    Upset stomach, can take by shot not by mouth  . Simvastatin Other (See Comments)    Muscular pain and weakness  . Sulfa Antibiotics Other (See Comments)    Upset stomach   . Latex Rash  . Metformin Hcl Nausea Only  . Voltaren [Diclofenac Sodium] Other (See Comments)    Unknown    Family History  Problem Relation Age of Onset  . Depression Mother   . Diabetes Mother   . Mental illness Mother        OCD  . Cerebral aneurysm Father        age 80  . Hypertension Sister   . Diabetes Daughter   . Hyperlipidemia Daughter   . Hyperparathyroidism Neg Hx      Prior to Admission medications   Medication Sig Start Date End Date Taking? Authorizing Provider  acetaminophen (TYLENOL) 500 MG tablet Take 1,000 mg by mouth 3 (three) times daily.     [provider]  alum & mag hydroxide-simeth (MAALOX/MYLANTA) 200-200-20 MG/5ML suspension Take 30 mLs by mouth as needed for indigestion or heartburn.    [provider]  cholecalciferol (VITAMIN D) 400 units TABS tablet Take 400 Units by mouth daily.    [provider]  docusate sodium (COLACE) 100 MG capsule Take 100 mg by mouth daily.    [provider]  famotidine (PEPCID AC) 10 MG chewable tablet Chew 10 mg by mouth daily as needed for heartburn.    [provider]  gabapentin (NEURONTIN) 300 MG capsule Take 1 capsule (300 mg total) by mouth 2 (two) times daily. 06/23/18   Reed, Tiffany L, DO  Insulin Glargine (LANTUS SOLOSTAR) 100 UNIT/ML Solostar Pen INJECT 45 UNITS  SUBCUTANEOUSLY  IN THE  MORNING 11/15/17   Reed, Tiffany L, DO  Insulin Isophane & Regular Human (HUMULIN 70/30 MIX) (70-30) 100 UNIT/ML PEN Inject 22 Units into the skin every evening.    [provider]  Insulin Pen Needle (B-D ULTRAFINE III SHORT PEN) 31G X 8 MM MISC Use twice daily for insulin 02/03/18   Reed, Tiffany L, DO  loratadine (CLARITIN) 10 MG tablet Take 10 mg by mouth daily.    [provider]  metoprolol tartrate (LOPRESSOR) 50 MG tablet TAKE 1 TABLET BY MOUTH TWO  TIMES DAILY 04/21/18   Reed, Tiffany L, DO  nystatin-triamcinolone (MYCOLOG II) cream Apply topically 4 (four) times daily. 08/19/17   Reed, Tiffany L, DO  Probiotic Product (PROBIOTIC-10) CHEW Chew 1 each by mouth daily.     [provider]  tamsulosin (FLOMAX) 0.4 MG CAPS capsule TAKE 1 CAPSULE BY MOUTH  DAILY FOR PROSTATE 06/02/18   Reed, Tiffany L, DO  trolamine salicylate (ASPERCREME) 10 % cream Apply 1 application topically as needed for muscle pain.    [provider]    Physical Exam: Vitals:   06/29/18 0315 06/29/18 0330 06/29/18 0400 06/29/18 0415  BP: (!) 121/57 (!) 111/59 (!) 126/53 111/66  Pulse: 99 (!) 101 (!) 105 (!) 110  Resp: (!) 28 (!) 26 20 (!) 25   Temp:      TempSrc:      SpO2: 95% 96% 97% 97%    Constitutional: NAD, appears uncomfortable   Eyes: PERTLA, lids and conjunctivae normal ENMT: Mucous membranes are moist. Posterior pharynx clear of any exudate or lesions.   Neck: normal, supple, no masses, no thyromegaly Respiratory: mild tachypnea, speaking full sentences, no wheezing, no crackles. No accessory muscle use.  Cardiovascular: Rate ~110 and regular. Trace pedal edema bilaterally.  No significant JVD. Abdomen: No distension, no tenderness, soft. Bowel sounds active.  Musculoskeletal: no clubbing / cyanosis. No joint deformity upper and lower extremities.   Skin: Abrasions to lower left leg with minimal surrounding redness, no drainage. Warm, dry, well-perfused. Neurologic: CN 2-12 grossly intact. Sensation intact. Strength 5/5 in all 4 limbs.  Psychiatric: Alert and oriented to person, place, and situation. Pleasant, cooperative.     Labs on Admission: I have personally reviewed following labs and imaging studies  CBC: Recent Labs  Lab 06/28/18 2330  WBC 13.9*  NEUTROABS 11.7*  HGB 12.8*  HCT 38.4*  MCV 90.6  PLT 599   Basic Metabolic Panel: Recent Labs  Lab 06/28/18 2330  NA 136  K 4.3  CL 107  CO2 19*  GLUCOSE 180*  BUN 18  CREATININE 1.23  CALCIUM 9.4   GFR: Estimated Creatinine Clearance: 38.8 mL/min (by C-G formula based on SCr of 1.23 mg/dL). Liver Function Tests: Recent Labs  Lab 06/28/18 2330  AST 24  ALT 20  ALKPHOS 63  BILITOT 0.7  PROT 6.2*  ALBUMIN 4.1   No results for input(s): LIPASE, AMYLASE in the last 168 hours. No results for input(s): AMMONIA in the last 168 hours. Coagulation Profile: No results for input(s): INR, PROTIME in the last 168 hours. Cardiac Enzymes: No results for input(s): CKTOTAL, CKMB, CKMBINDEX, TROPONINI in the last 168 hours. BNP (last 3 results) No results for input(s): PROBNP in the last 8760 hours. HbA1C: No results for input(s): HGBA1C in the  last 72 hours. CBG: No results for input(s): GLUCAP in the last 168 hours. Lipid Profile: No results for input(s): CHOL, HDL, LDLCALC, TRIG, CHOLHDL, LDLDIRECT in the  last 72 hours. Thyroid Function Tests: No results for input(s): TSH, T4TOTAL, FREET4, T3FREE, THYROIDAB in the last 72 hours. Anemia Panel: No results for input(s): VITAMINB12, FOLATE, FERRITIN, TIBC, IRON, RETICCTPCT in the last 72 hours. Urine analysis:    Component Value Date/Time   COLORURINE YELLOW 06/28/2018 2343   APPEARANCEUR CLEAR 06/28/2018 2343   LABSPEC 1.018 06/28/2018 2343   PHURINE 5.0 06/28/2018 2343   GLUCOSEU NEGATIVE 06/28/2018 2343   HGBUR NEGATIVE 06/28/2018 2343   BILIRUBINUR NEGATIVE 06/28/2018 2343   KETONESUR NEGATIVE 06/28/2018 2343   PROTEINUR NEGATIVE 06/28/2018 2343   UROBILINOGEN 0.2 11/06/2013 1621   NITRITE NEGATIVE 06/28/2018 2343   LEUKOCYTESUR NEGATIVE 06/28/2018 2343   Sepsis Labs: @LABRCNTIP (procalcitonin:4,lacticidven:4) ) Recent Results (from the past 240 hour(s))  SARS Coronavirus 2 (CEPHEID- Performed in [place] hospital lab), Hosp Order     Status: None   Collection Time: 06/29/18  1:57 AM  Result Value Ref Range Status   SARS Coronavirus 2 NEGATIVE NEGATIVE Final    Comment: (NOTE) If result is NEGATIVE SARS-CoV-2 target nucleic acids are NOT DETECTED. The SARS-CoV-2 RNA is generally detectable in upper and lower  respiratory specimens during the acute phase of infection. The lowest  concentration of SARS-CoV-2 viral copies this assay can detect is 250  copies / mL. A negative result does not preclude SARS-CoV-2 infection  and should not be used as the sole basis for treatment or other  patient management decisions.  A negative result may occur with  improper specimen collection / handling, submission of specimen other  than nasopharyngeal swab, presence of viral mutation(s) within the  areas targeted by this assay, and inadequate number of viral copies  (<250  copies / mL). A negative result must be combined with clinical  observations, patient history, and epidemiological information. If result is POSITIVE SARS-CoV-2 target nucleic acids are DETECTED. The SARS-CoV-2 RNA is generally detectable in upper and lower  respiratory specimens dur ing the acute phase of infection.  Positive  results are indicative of active infection with SARS-CoV-2.  Clinical  correlation with patient history and other diagnostic information is  necessary to determine patient infection status.  Positive results do  not rule out bacterial infection or co-infection with other viruses. If result is PRESUMPTIVE POSTIVE SARS-CoV-2 nucleic acids MAY BE PRESENT.   A presumptive positive result was obtained on the submitted specimen  and confirmed on repeat testing.  While 2019 novel coronavirus  (SARS-CoV-2) nucleic acids may be present in the submitted sample  additional confirmatory testing may be necessary for epidemiological  and / or clinical management purposes  to differentiate between  SARS-CoV-2 and other Sarbecovirus currently known to infect humans.  If clinically indicated additional testing with an alternate test  methodology [phone]) is advised. The SARS-CoV-2 RNA is generally  detectable in upper and lower respiratory sp ecimens during the acute  phase of infection. The expected result is Negative. Fact Sheet for Patients:  StrictlyIdeas.no Fact Sheet for Healthcare Providers: BankingDealers.co.za This test is not yet approved or cleared by the [place] FDA and has been authorized for detection and/or diagnosis of SARS-CoV-2 by FDA under an Emergency Use Authorization (EUA).  This EUA will remain in effect (meaning this test can be used) for the duration of the COVID-19 declaration under Section 564(b)(1) of the Act, 21 U.S.C. section 360bbb-3(b)(1), unless the authorization is terminated or revoked  sooner. Performed at [facility], [place] [address], [place], [place] [address]  Radiological Exams on Admission: Dg Chest 2 View  Result Date: 06/29/2018 CLINICAL DATA:  85 year old male with fever. EXAM: CHEST - 2 VIEW COMPARISON:  Chest radiograph dated 02/25/2015 FINDINGS: The heart size and mediastinal contours are within normal limits. Both lungs are clear. The visualized skeletal structures are unremarkable. IMPRESSION: No active cardiopulmonary disease. Electronically Signed   By: Anner Crete M.D.   On: 06/29/2018 01:45    EKG: Independently reviewed. Sinus tachycardia (rate 100), RBBB, ST abnormality is similar to prior.   Assessment/Plan   1. SIRS  - Presents with 2 days of fevers and shaking chills, and is found to be febrile, slightly tachycardic and tachypneic, with leukocytosis, normal lactate, clear CXR, unremarkable UA, negative COVID-19   - Blood pressure has been normal and lacate also normal  - Patient denies respiratory, urinary, or GI sxs, has benign abdominal exam, no meningismus, and superficial abrasions to lower left leg that do not appear acutely infected  - Blood cultures were collected in ED and he was started on broad-spectrum antibiotics  - Continue empiric antibiotics for now while following cultures and clinical course     2. Insulin-dependent DM  - Latest A1c was 7.8%  - Managed with Lantus and regular human insulin at home  - Cotinue Lantus with correctional Novolog while in hospital   3. Hypertension  - BP at goal  - Continue metoprolol as tolerated    4. Cervical radiculopathy  - Continue gabapentin    5. Prostate cancer  - Followed by urology, status-post radiation, has had low PSA per notes    - Continue Flomax    PPE: Mask, face shield. Patient wearing mask.  DVT prophylaxis: Lovenox  Code Status: Full  Family Communication: Discussed with patient  Consults called: None Admission status: Observation     Vianne Bulls,  MD [facility] Pager [phone]  If 7PM-7AM, please contact night-coverage [email or web address] Password [facility]  06/29/2018, 4:37 AM

## 2018-06-29 NOTE — ED Notes (Signed)
Report given to 6 N RN. All questions answered 

## 2018-06-29 NOTE — Progress Notes (Signed)
Addendum  Hospital course since I saw him earlier today.  Earlier this afternoon patient became slightly hypotensive with SBP in the 80s.  Bolused him with 500 mL normal saline and increased IV fluids.  Rechecked with RN after bolus and patient reportedly had some tachypnea and wheezing.  I went back to bedside short while ago, interviewed and examined patient in detail along with RN.  Patient just feels unwell.  Reports ongoing pain in his lower extremities.  Denies dyspnea or any other complaints.  Exam:  Ill looking.  Mildly tachypneic.  Rectal temperature 103.6 F. RS: Scattered occasional bilateral expiratory wheezing but no crackles. CVS: S1 and S2 heard, RRR.  No JVD.  No murmurs.  Trace bilateral ankle edema. Abdomen: Nondistended, soft and nontender.  Normal bowel sounds heard. Skin: Same small puncture wound over left leg mid lateral aspect with some tenderness but no other acute findings.  No other skin acute skin lesions noted. CNS: Alert but may be slightly disoriented.  Chest x-ray repeated and personally reviewed: No acute findings. RVP: Negative. Procalcitonin: 0.11.  A/P: Febrile illness/sepsis: Unclear etiology.  No clear source identified based on clinical exam or extensive evaluation thus far.  Only possibility appears to be the small puncture wound on his left leg even that does not look infected.  Transfer to progressive care unit for close monitoring and management, especially if he were to decline.  I discussed in detail with ID MD on call who recommended following:  Check CK to rule out rhabdomyolysis. May change cefepime to Zosyn for some anaerobic coverage.  Patient already on Flagyl. Repeat set of blood cultures. Check TTE. Consider ultrasound of left leg to look for any collections. Patient has been on antibiotics for less than 24 hours and may not have been enough time for them to take effect. Requested ID formally to consult in a.m.  Discussed with  radiology and ordered left leg soft tissue ultrasound. Trend repeat lactate which was normal early this morning. Reducing IV fluids due to wheezing and concern for developing pulmonary edema. Discontinued metoprolol due to recent hypotension. Check TSH.  I called and updated patient's daughter regarding above.  Vernell Leep, MD, FACP, [facility]  To contact the attending provider between 7A-7P or the covering provider during after hours 7P-7A, please log into the web site [email or web address] and access using universal [place] password for that web site. If you do not have the password, please call the hospital operator.

## 2018-06-29 NOTE — Progress Notes (Signed)
Report called to [facility] on 5W. Patient will be transferred to 5W13.

## 2018-06-29 NOTE — ED Notes (Signed)
Willaim Bane- Daughter, would like to be called for update on patient- [phone]

## 2018-06-29 NOTE — Progress Notes (Signed)
Called operator to notify person doing ECHOs so they can call me back for ECHO order.

## 2018-06-29 NOTE — Progress Notes (Signed)
Pharmacy Antibiotic Note  ELIS SAUBER is a 85 y.o. male admitted on 06/28/2018 with sepsis.  Pharmacy has been consulted for Vancomycin/Cefepime dosing. WBC mildly elevated. Renal function age appropriate. Febrile up to 101.8. COVID negative.   Plan: Vancomycin 1000 mg IV q24h >>Estimated AUC:  Cefepime 2g IV q12h Trend WBC, temp, renal function  F/U infectious work-up Drug levels as indicated   Height: 5\' 7"  (170.2 cm) Weight: 160 lb 7.9 oz (72.8 kg) IBW/kg (Calculated) : 66.1  Temp (24hrs), Avg:100.6 F (38.1 C), Min:98.9 F (37.2 C), Max:101.8 F (38.8 C)  Recent Labs  Lab 06/28/18 2330  WBC 13.9*  CREATININE 1.23  LATICACIDVEN 1.3    Estimated Creatinine Clearance: 38.8 mL/min (by C-G formula based on SCr of 1.23 mg/dL).    Allergies  Allergen Reactions  . Aspirin Other (See Comments)    Bothers stomach, thins blood   . Atorvastatin Other (See Comments)    Muscular pain and weakness  . Prednisone Other (See Comments)    Upset stomach, can take by shot not by mouth  . Simvastatin Other (See Comments)    Muscular pain and weakness  . Sulfa Antibiotics Other (See Comments)    Upset stomach   . Latex Rash  . Metformin Hcl Nausea Only  . Voltaren [Diclofenac Sodium] Other (See Comments)    Unknown    Narda Bonds 06/29/2018 6:12 AM

## 2018-06-29 NOTE — Progress Notes (Signed)
Dr. Algis Liming notified of below vital signs. Orders placed.    06/29/18 1613  Vitals  Temp 99 F (37.2 C)  Temp Source Oral  BP (!) 96/40  MAP (mmHg) (!) 56  BP Location Right Arm  BP Method Doppler  Patient Position (if appropriate) Sitting  Pulse Rate 96  Pulse Rate Source Dinamap  Resp (!) 22  Oxygen Therapy  SpO2 96 %  O2 Device Room Air  MEWS Score  MEWS RR 1  MEWS Pulse 0  MEWS Systolic 1  MEWS LOC 0  MEWS Temp 0  MEWS Score 2  MEWS Score Color Yellow

## 2018-06-29 NOTE — ED Notes (Signed)
No addl blood draw,  Pt enroute to inpatient floor. 

## 2018-06-29 NOTE — Progress Notes (Signed)
Pharmacy Antibiotic Note  Micheal [name] is a 85 y.o. male admitted on 06/28/2018 with fever without clear source of infection. Initially started on vancomycin, cefepime, and Flagyl and defervesced. Now with new fever to 103.6 and progressively more hypotensive. Pharmacy has been consulted for Zosyn dosing. Scr 1.05, estimated CrCl ~ 45 mL/min.  Plan: Stop cefepime and Flagyl Start Zosyn 3.375g IV q8h F/u clinical status, C&S, renal function, de-escalation, LOT, vancomycin levels as appropriate  Height: 5\' 7"  (170.2 cm) Weight: 160 lb 7.9 oz (72.8 kg) IBW/kg (Calculated) : 66.1  Temp (24hrs), Avg:99.9 F (37.7 C), Min:98.1 F (36.7 C), Max:103.6 F (39.8 C)  Recent Labs  Lab 06/28/18 2330 06/29/18 0559  WBC 13.9* 18.0*  CREATININE 1.23 1.05  LATICACIDVEN 1.3 1.0    Estimated Creatinine Clearance: 45.5 mL/min (by C-G formula based on SCr of 1.05 mg/dL).    Allergies  Allergen Reactions  . Aspirin Other (See Comments)    Bothers stomach, thins blood   . Atorvastatin Other (See Comments)    Muscular pain and weakness  . Prednisone Other (See Comments)    Upset stomach, can take by shot not by mouth  . Simvastatin Other (See Comments)    Muscular pain and weakness  . Sulfa Antibiotics Other (See Comments)    Upset stomach   . Latex Rash  . Metformin Hcl Nausea Only  . Voltaren [Diclofenac Sodium] Other (See Comments)    Unknown    Antimicrobials this admission: Vancomycin 6/7 >> Cefepime 6/7 x2 Flagyl 6/7 x2 Zosyn 6/7 >>  Microbiology results: 6/7 BCx (#1): collected 6/7 BCx (#2): ordered 6/7 COVID: neg 6/7 MRSA PCR: neg  Thank you for allowing pharmacy to be a part of this patient's care.  Mila Merry Gerarda Fraction, PharmD, [place] PGY2 Infectious Diseases Pharmacy Resident Phone: [phone] 06/29/2018 5:42 PM

## 2018-06-30 ENCOUNTER — Other Ambulatory Visit: Payer: Self-pay

## 2018-06-30 ENCOUNTER — Inpatient Hospital Stay (HOSPITAL_COMMUNITY): Payer: Medicare Other

## 2018-06-30 DIAGNOSIS — S81809A Unspecified open wound, unspecified lower leg, initial encounter: Secondary | ICD-10-CM

## 2018-06-30 DIAGNOSIS — E119 Type 2 diabetes mellitus without complications: Secondary | ICD-10-CM

## 2018-06-30 DIAGNOSIS — S81802A Unspecified open wound, left lower leg, initial encounter: Secondary | ICD-10-CM

## 2018-06-30 DIAGNOSIS — H3530 Unspecified macular degeneration: Secondary | ICD-10-CM

## 2018-06-30 DIAGNOSIS — Z888 Allergy status to other drugs, medicaments and biological substances status: Secondary | ICD-10-CM

## 2018-06-30 DIAGNOSIS — X58XXXA Exposure to other specified factors, initial encounter: Secondary | ICD-10-CM

## 2018-06-30 DIAGNOSIS — M1990 Unspecified osteoarthritis, unspecified site: Secondary | ICD-10-CM

## 2018-06-30 DIAGNOSIS — Z9221 Personal history of antineoplastic chemotherapy: Secondary | ICD-10-CM

## 2018-06-30 DIAGNOSIS — Z8546 Personal history of malignant neoplasm of prostate: Secondary | ICD-10-CM

## 2018-06-30 DIAGNOSIS — Z91040 Latex allergy status: Secondary | ICD-10-CM

## 2018-06-30 DIAGNOSIS — R509 Fever, unspecified: Secondary | ICD-10-CM

## 2018-06-30 DIAGNOSIS — I10 Essential (primary) hypertension: Secondary | ICD-10-CM

## 2018-06-30 DIAGNOSIS — M25551 Pain in right hip: Secondary | ICD-10-CM

## 2018-06-30 DIAGNOSIS — R29898 Other symptoms and signs involving the musculoskeletal system: Secondary | ICD-10-CM

## 2018-06-30 DIAGNOSIS — M25552 Pain in left hip: Secondary | ICD-10-CM

## 2018-06-30 DIAGNOSIS — Z881 Allergy status to other antibiotic agents status: Secondary | ICD-10-CM

## 2018-06-30 DIAGNOSIS — Z923 Personal history of irradiation: Secondary | ICD-10-CM

## 2018-06-30 DIAGNOSIS — M5412 Radiculopathy, cervical region: Secondary | ICD-10-CM

## 2018-06-30 DIAGNOSIS — Z886 Allergy status to analgesic agent status: Secondary | ICD-10-CM

## 2018-06-30 DIAGNOSIS — S81802D Unspecified open wound, left lower leg, subsequent encounter: Secondary | ICD-10-CM

## 2018-06-30 LAB — CBC WITH DIFFERENTIAL/PLATELET
Abs Immature Granulocytes: 0.08 10*3/uL — ABNORMAL HIGH (ref 0.00–0.07)
Basophils Absolute: 0 10*3/uL (ref 0.0–0.1)
Basophils Relative: 0 %
Eosinophils Absolute: 0.3 10*3/uL (ref 0.0–0.5)
Eosinophils Relative: 2 %
HCT: 33.4 % — ABNORMAL LOW (ref 39.0–52.0)
Hemoglobin: 11.1 g/dL — ABNORMAL LOW (ref 13.0–17.0)
Immature Granulocytes: 1 %
Lymphocytes Relative: 3 %
Lymphs Abs: 0.5 10*3/uL — ABNORMAL LOW (ref 0.7–4.0)
MCH: 30.2 pg (ref 26.0–34.0)
MCHC: 33.2 g/dL (ref 30.0–36.0)
MCV: 90.8 fL (ref 80.0–100.0)
Monocytes Absolute: 0.8 10*3/uL (ref 0.1–1.0)
Monocytes Relative: 5 %
Neutro Abs: 14 10*3/uL — ABNORMAL HIGH (ref 1.7–7.7)
Neutrophils Relative %: 89 %
Platelets: 127 10*3/uL — ABNORMAL LOW (ref 150–400)
RBC: 3.68 MIL/uL — ABNORMAL LOW (ref 4.22–5.81)
RDW: 12.5 % (ref 11.5–15.5)
WBC: 15.7 10*3/uL — ABNORMAL HIGH (ref 4.0–10.5)
nRBC: 0 % (ref 0.0–0.2)

## 2018-06-30 LAB — GLUCOSE, CAPILLARY
Glucose-Capillary: 122 mg/dL — ABNORMAL HIGH (ref 70–99)
Glucose-Capillary: 164 mg/dL — ABNORMAL HIGH (ref 70–99)
Glucose-Capillary: 126 mg/dL — ABNORMAL HIGH (ref 70–99)
Glucose-Capillary: 94 mg/dL (ref 70–99)

## 2018-06-30 LAB — BASIC METABOLIC PANEL
Anion gap: 8 (ref 5–15)
BUN: 12 mg/dL (ref 8–23)
CO2: 19 mmol/L — ABNORMAL LOW (ref 22–32)
Calcium: 8 mg/dL — ABNORMAL LOW (ref 8.9–10.3)
Chloride: 109 mmol/L (ref 98–111)
Creatinine, Ser: 1.16 mg/dL (ref 0.61–1.24)
GFR calc Af Amer: >60 mL/min (ref >60)
GFR calc non Af Amer: 56 mL/min — ABNORMAL LOW (ref >60)
Glucose, Bld: 138 mg/dL — ABNORMAL HIGH (ref 70–99)
Potassium: 3.5 mmol/L (ref 3.5–5.1)
Sodium: 136 mmol/L (ref 135–145)

## 2018-06-30 LAB — ECHOCARDIOGRAM COMPLETE
Height: 67 in
Weight: 2567.92 oz

## 2018-06-30 MED ORDER — SODIUM CHLORIDE 0.9 % IV SOLN
2.0000 g | INTRAVENOUS | Status: DC
  Administered 2018-06-30 – 2018-07-02 (×3): 2 g via INTRAVENOUS
  Filled 2018-06-30 (×3): qty 20
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
## 2018-06-30 NOTE — Progress Notes (Addendum)
Pt was transferred to the floor. Pt was alert and oriented times four. Pt was educated on how to use the call bell if he needed any thing. The call bell and phone placed in the pt reach.

## 2018-06-30 NOTE — Progress Notes (Signed)
  Echocardiogram 2D Echocardiogram has been performed.  Jennette Dubin 06/30/2018, 2:59 PM

## 2018-06-30 NOTE — Progress Notes (Signed)
PROGRESS NOTE   Micheal [name]  XBD:[id number]    DOB: [birth year]    DOA: 06/28/2018  PCP: Gayland Curry, DO   I have briefly reviewed patients previous medical records in [facility].  Brief Narrative:  85 year old male with PMH of GERD, IBS, prostate cancer status post radiation, HLD, PSVT, RLS, incomplete RBBB, DM 2, HTN, chronic lower extremity pain, recent left leg puncture wound by arrested rake, presented to [facility] ED on 06/28/2018 due to fever, shaking chills and some cough.  Admitted for febrile illness without clear source/SIRS, evaluation ongoing, empirically on IV vancomycin and cefepime.  On 6/7 evening patient deteriorated with high fever/103.6 F, hypotension, transferred to progressive care unit.  Some improvement today.  ID consulted, optimized antibiotics, requesting MRI of lumbar spine to rule out infectious etiology.   Assessment & Plan:   Principal Problem:   Fever Active Problems:   Malignant neoplasm of prostate (HCC)   Essential hypertension   DM (diabetes mellitus), type 2 with peripheral vascular complications (HCC)   Chronic cervical radiculopathy   SIRS (systemic inflammatory response syndrome) (HCC)   Complaints of weakness of lower extremity   Open wound of lower extremity   Fever/SIRS of undetermined source  Presented with 2 days history of fever and shaking chills.  Met SIRS criteria on admission.  No clear source clinically or on evaluation thus far.  Neutrophilic [id number]/89 K+.  Lactate normal.  Chest x-ray clear x2.  UA unremarkable.  SARS coronavirus 2 testing negative.  RVP negative.  Procalcitonin: 0.11.  Left leg soft tissue ultrasound without abscess.  MRSA PCR negative.  Blood cultures x4: Negative to date.  Received tetanus booster shot in ED.  Became hypotensive 6/7, bolused with IV fluids, some wheezing although no clinical or radiological pulmonary edema, reduced IV fluids and then stopped today.  IV vancomycin, cefepime  >Fortaz, Flagyl, now changed to vancomycin and ceftriaxone by ID.  ID consultation appreciated, due to his complaints of significant pain from hip down (although he reported this to be mostly chronic), left leg wound with purulence today, checking MRI of LS-spine with and without contrast to rule out infectious etiology.  If this is negative and he continues to have fevers or significant left leg pain and tenderness, may need CT of left lower extremity.  Fevers down/99.9 F today.  Overall looks better than he did yesterday.  Type II DM/IDDM  Latest A1c: 7.8.  Currently on Lantus 35 units daily and NovoLog SSI.  Reasonable inpatient control, continue current regimen.  Patient states that at home he takes both Lantus 45 units in the morning and Humulin 70/30 units in the evening which can cause significant issues with hypoglycemia and will likely stop Humulin at discharge and may need to be substituted with short-acting insulin.  Essential hypertension  Hypotensive on 6/7 related to high fevers.  Metoprolol was discontinued.  Blood pressures have normalized.  Monitor for today and consider resuming metoprolol even at reduced dose from tomorrow.  Prostate cancer status post radiation/BPH  Continue Flomax.  Outpatient follow-up with urology, reportedly has had low PSAs.  GERD  Continue Pepcid  Anemia  Stable.  Thrombocytopenia  Possibly related to acute infectious etiology.  Follow CBC in a.m.  Acute kidney injury  Possibly due to acute febrile illness and insensible fluid losses.  Resolved after IV fluids.  IV fluids discontinued.   DVT prophylaxis: Lovenox Code Status: Full Family Communication: Discussed extensively with patient's daughter 6/8, updated care and answered questions.  Disposition: To be determined pending clinical improvement.   Consultants:  Infectious disease  Procedures:  None  Antimicrobials:  IV cefepime, Zosyn, and Flagyl-discontinued IV  vancomycin & ceftriaxone   Subjective: States that he had a miserable night last night due to severe pain all over that improved after pain pill.  Reports that pain pill makes him confused and he cannot even remember his name.  Currently states that he feels better.  Denies dyspnea.  ROS: As above, otherwise negative.  Objective:  Vitals:   06/30/18 1000 06/30/18 1153 06/30/18 1228 06/30/18 1300  BP:  (!) 133/57    Pulse:  (!) 116 (!) 113 (!) 106  Resp:  (!) _0 Temp: 99.9 F (37.7 C) 98.2 F (36.8 C)    TempSrc: Oral Oral    SpO2:  98% 94% 95%  Weight:      Height:        Examination:  General exam: Pleasant elderly male, moderately built and nourished looks much better than yesterday afternoon.  Does not appear in any distress. Respiratory system: Mild intermittent upper airway wheeze but otherwise clear to auscultation without crackles.  No increased work of breathing. Cardiovascular system: S1 & S2 heard, RRR. No JVD, murmurs, rubs, gallops or clicks. No pedal edema.  Telemetry personally reviewed:         Sinus rhythm.  Occasional mild sinus tachycardia. Gastrointestinal system: Abdomen is nondistended, soft and nontender. No organomegaly or masses felt. Normal bowel sounds heard.  Stable Central nervous system: Alert and oriented. No focal neurological deficits.  Stable Extremities: Symmetric 5 x 5 power. Skin: Lateral aspect of left lower leg noted tiny area of superficial wound approximately 0.5 cm in diameter with some purulence today without surrounding erythema but exquisite surrounding tenderness and no fluctuation. Psychiatry: Judgement and insight appear normal. Mood & affect appropriate.     Data Reviewed: I have personally reviewed following labs and imaging studies  CBC: Recent Labs  Lab 06/28/18 2330 06/29/18 0559 06/30/18 0307  WBC 13.9* 18.0* 15.7*  NEUTROABS 11.7* 15.0* 14.0*  HGB 12.8* 11.2* 11.1*  HCT 38.4* 32.9* 33.4*  MCV 90.6 90.1 90.8    PLT 197 155 893*   Basic Metabolic Panel: Recent Labs  Lab 06/28/18 2330 06/29/18 0559 06/30/18 0307  NA 136 139 136  K 4.3 4.2 3.5  CL 107 108 109  CO2 19* 20* 19*  GLUCOSE 180* 150* 138*  BUN _1 CREATININE 1.23 1.05 1.16  CALCIUM 9.4 8.5* 8.0*   Liver Function Tests: Recent Labs  Lab 06/28/18 2330  AST 24  ALT 20  ALKPHOS 63  BILITOT 0.7  PROT 6.2*  ALBUMIN 4.1   CBG: Recent Labs  Lab 06/29/18 1214 06/29/18 1733 06/29/18 2222 06/30/18 0837 06/30/18 1147  GLUCAP 204* 145* 133* 122* 164*    Recent Results (from the past 240 hour(s))  SARS Coronavirus 2 (CEPHEID- Performed in [place] hospital lab), Hosp Order     Status: None   Collection Time: 06/29/18  1:57 AM  Result Value Ref Range Status   SARS Coronavirus 2 NEGATIVE NEGATIVE Final    Comment: (NOTE) If result is NEGATIVE SARS-CoV-2 target nucleic acids are NOT DETECTED. The SARS-CoV-2 RNA is generally detectable in upper and lower  respiratory specimens during the acute phase of infection. The lowest  concentration of SARS-CoV-2 viral copies this assay can detect is 250  copies / mL. A negative result does not preclude SARS-CoV-2 infection  and  should not be used as the sole basis for treatment or other  patient management decisions.  A negative result may occur with  improper specimen collection / handling, submission of specimen other  than nasopharyngeal swab, presence of viral mutation(s) within the  areas targeted by this assay, and inadequate number of viral copies  (<250 copies / mL). A negative result must be combined with clinical  observations, patient history, and epidemiological information. If result is POSITIVE SARS-CoV-2 target nucleic acids are DETECTED. The SARS-CoV-2 RNA is generally detectable in upper and lower  respiratory specimens dur ing the acute phase of infection.  Positive  results are indicative of active infection with SARS-CoV-2.  Clinical  correlation  with patient history and other diagnostic information is  necessary to determine patient infection status.  Positive results do  not rule out bacterial infection or co-infection with other viruses. If result is PRESUMPTIVE POSTIVE SARS-CoV-2 nucleic acids MAY BE PRESENT.   A presumptive positive result was obtained on the submitted specimen  and confirmed on repeat testing.  While 2019 novel coronavirus  (SARS-CoV-2) nucleic acids may be present in the submitted sample  additional confirmatory testing may be necessary for epidemiological  and / or clinical management purposes  to differentiate between  SARS-CoV-2 and other Sarbecovirus currently known to infect humans.  If clinically indicated additional testing with an alternate test  methodology [phone]) is advised. The SARS-CoV-2 RNA is generally  detectable in upper and lower respiratory sp ecimens during the acute  phase of infection. The expected result is Negative. Fact Sheet for Patients:  StrictlyIdeas.no Fact Sheet for Healthcare Providers: BankingDealers.co.za This test is not yet approved or cleared by the [place] FDA and has been authorized for detection and/or diagnosis of SARS-CoV-2 by FDA under an Emergency Use Authorization (EUA).  This EUA will remain in effect (meaning this test can be used) for the duration of the COVID-19 declaration under Section 564(b)(1) of the Act, 21 U.S.C. section 360bbb-3(b)(1), unless the authorization is terminated or revoked sooner. Performed at [facility], [place] [address], [place], [place] [address]   Blood culture (routine x 2)     Status: None (Preliminary result)   Collection Time: 06/29/18  2:35 AM  Result Value Ref Range Status   Specimen Description BLOOD RIGHT HAND  Final   Special Requests   Final    BOTTLES DRAWN AEROBIC AND ANAEROBIC Blood Culture adequate volume   Culture   Final    NO GROWTH 1 DAY Performed at  [facility], [place] [address], [place], [place] [address]    Report Status PENDING  Incomplete  Blood culture (routine x 2)     Status: None (Preliminary result)   Collection Time: 06/29/18  2:40 AM  Result Value Ref Range Status   Specimen Description BLOOD LEFT ANTECUBITAL  Final   Special Requests   Final    BOTTLES DRAWN AEROBIC AND ANAEROBIC Blood Culture adequate volume   Culture   Final    NO GROWTH 1 DAY Performed at [facility], [place] [address], [place], [place] [address]    Report Status PENDING  Incomplete  MRSA PCR Screening     Status: None   Collection Time: 06/29/18 10:50 AM  Result Value Ref Range Status   MRSA by PCR NEGATIVE NEGATIVE Final    Comment:        The GeneXpert MRSA Assay (FDA approved for NASAL specimens only), is one component of a  comprehensive MRSA colonization surveillance program. It is not intended to diagnose MRSA infection nor to guide or monitor treatment for MRSA infections. Performed at [facility], [place] [address] [place], [place] [address]   Respiratory Panel by PCR     Status: None   Collection Time: 06/29/18  2:06 PM  Result Value Ref Range Status   Adenovirus NOT DETECTED NOT DETECTED Final   Coronavirus 229E NOT DETECTED NOT DETECTED Final    Comment: (NOTE) The Coronavirus on the Respiratory Panel, DOES NOT test for the novel  Coronavirus (2019 nCoV)    Coronavirus HKU1 NOT DETECTED NOT DETECTED Final   Coronavirus NL63 NOT DETECTED NOT DETECTED Final   Coronavirus OC43 NOT DETECTED NOT DETECTED Final   Metapneumovirus NOT DETECTED NOT DETECTED Final   Rhinovirus / Enterovirus NOT DETECTED NOT DETECTED Final   Influenza A NOT DETECTED NOT DETECTED Final   Influenza B NOT DETECTED NOT DETECTED Final   Parainfluenza Virus 1 NOT DETECTED NOT DETECTED Final   Parainfluenza Virus 2 NOT DETECTED NOT DETECTED Final   Parainfluenza Virus 3 NOT DETECTED NOT DETECTED Final   Parainfluenza Virus 4 NOT DETECTED NOT  DETECTED Final   Respiratory Syncytial Virus NOT DETECTED NOT DETECTED Final   Bordetella pertussis NOT DETECTED NOT DETECTED Final   Chlamydophila pneumoniae NOT DETECTED NOT DETECTED Final   Mycoplasma pneumoniae NOT DETECTED NOT DETECTED Final    Comment: Performed at [facility], [place]. [address] [place], [place] [address]  Culture, blood (Routine X 2) w Reflex to ID Panel     Status: None (Preliminary result)   Collection Time: 06/29/18  6:34 PM  Result Value Ref Range Status   Specimen Description BLOOD RIGHT ANTECUBITAL  Final   Special Requests   Final    BOTTLES DRAWN AEROBIC ONLY Blood Culture adequate volume   Culture   Final    NO GROWTH < 24 HOURS Performed at [facility], [place] [address] [place], [place] [address]    Report Status PENDING  Incomplete  Culture, blood (Routine X 2) w Reflex to ID Panel     Status: None (Preliminary result)   Collection Time: 06/29/18  6:34 PM  Result Value Ref Range Status   Specimen Description BLOOD RIGHT ANTECUBITAL  Final   Special Requests   Final    BOTTLES DRAWN AEROBIC ONLY Blood Culture results may not be optimal due to an excessive volume of blood received in culture bottles   Culture   Final    NO GROWTH < 24 HOURS Performed at [facility], [place] [address], [place], [place] [address]    Report Status PENDING  Incomplete         Radiology Studies: Dg Chest 2 View  Result Date: 06/29/2018 CLINICAL DATA:  85 year old male with fever. EXAM: CHEST - 2 VIEW COMPARISON:  Chest radiograph dated 02/25/2015 FINDINGS: The heart size and mediastinal contours are within normal limits. Both lungs are clear. The visualized skeletal structures are unremarkable. IMPRESSION: No active cardiopulmonary disease. Electronically Signed   By: Anner Crete M.D.   On: 06/29/2018 01:45   Dg Chest Port 1 View  Result Date: 06/29/2018 CLINICAL DATA:  Wheezing and fevers. EXAM: PORTABLE CHEST 1 VIEW COMPARISON:  06/29/2018  earlier. FINDINGS: Patient slightly rotated to the right. Lungs are adequately inflated without lobar consolidation or effusion. Cardiomediastinal silhouette and remainder of the exam is unchanged. IMPRESSION: No acute cardiopulmonary disease. Electronically Signed   By:  Marin Olp M.D.   On: 06/29/2018 16:52   [place] Singac Soft Tissue Non Vascular  Result Date: 06/29/2018 CLINICAL DATA:  85 year old male with history of puncture wound in the mid lateral left leg. High fevers. Evaluate for potential abscess. EXAM: ULTRASOUND LEFT LOWER EXTREMITY LIMITED TECHNIQUE: Ultrasound examination of the lower extremity soft tissues was performed in the area of clinical concern. COMPARISON:  NO PRIORS. FINDINGS: Joint Space: N/A. Muscles: Normal. Tendons: Normal Other Soft Tissue Structures: Normal. IMPRESSION: 1. No acute findings in the area of concern. Specifically, no focal fluid collection to suggest abscess. Electronically Signed   By: Vinnie Langton M.D.   On: 06/29/2018 19:59        Scheduled Meds:  acidophilus  1 capsule Oral Daily   enoxaparin (LOVENOX) injection  40 mg Subcutaneous Daily   gabapentin  300 mg Oral BID   insulin aspart  0-5 Units Subcutaneous QHS   insulin aspart  0-9 Units Subcutaneous TID WC   insulin glargine  35 Units Subcutaneous Daily   tamsulosin  0.4 mg Oral Daily   Continuous Infusions:  cefTRIAXone (ROCEPHIN)  IV 2 g (06/30/18 1330)   vancomycin 1,000 mg (06/29/18 2359)     LOS: 1 day     Vernell Leep, MD, FACP, [facility]. [facility]  To contact the attending provider between 7A-7P or the covering provider during after hours 7P-7A, please log into the web site [email or web address] and access using universal [place] password for that web site. If you do not have the password, please call the hospital operator.  06/30/2018, 1:33 PM

## 2018-07-01 ENCOUNTER — Inpatient Hospital Stay (HOSPITAL_COMMUNITY): Payer: Medicare Other

## 2018-07-01 LAB — CBC
HCT: 32.6 % — ABNORMAL LOW (ref 39.0–52.0)
Hemoglobin: 11.1 g/dL — ABNORMAL LOW (ref 13.0–17.0)
MCH: 30.4 pg (ref 26.0–34.0)
MCHC: 34 g/dL (ref 30.0–36.0)
MCV: 89.3 fL (ref 80.0–100.0)
Platelets: 134 10*3/uL — ABNORMAL LOW (ref 150–400)
RBC: 3.65 MIL/uL — ABNORMAL LOW (ref 4.22–5.81)
RDW: 12.4 % (ref 11.5–15.5)
WBC: 10.5 10*3/uL (ref 4.0–10.5)
nRBC: 0 % (ref 0.0–0.2)

## 2018-07-01 LAB — BASIC METABOLIC PANEL
Anion gap: 11 (ref 5–15)
BUN: 10 mg/dL (ref 8–23)
CO2: 19 mmol/L — ABNORMAL LOW (ref 22–32)
Calcium: 8.1 mg/dL — ABNORMAL LOW (ref 8.9–10.3)
Chloride: 104 mmol/L (ref 98–111)
Creatinine, Ser: 1.15 mg/dL (ref 0.61–1.24)
GFR calc Af Amer: >60 mL/min (ref >60)
GFR calc non Af Amer: 57 mL/min — ABNORMAL LOW (ref >60)
Glucose, Bld: 99 mg/dL (ref 70–99)
Potassium: 3.4 mmol/L — ABNORMAL LOW (ref 3.5–5.1)
Sodium: 134 mmol/L — ABNORMAL LOW (ref 135–145)

## 2018-07-01 LAB — GLUCOSE, CAPILLARY
Glucose-Capillary: 84 mg/dL (ref 70–99)
Glucose-Capillary: 105 mg/dL — ABNORMAL HIGH (ref 70–99)
Glucose-Capillary: 118 mg/dL — ABNORMAL HIGH (ref 70–99)
Glucose-Capillary: 106 mg/dL — ABNORMAL HIGH (ref 70–99)

## 2018-07-01 MED ORDER — GADOBUTROL 1 MMOL/ML IV SOLN
8.0000 mL | Freq: Once | INTRAVENOUS | Status: AC | PRN
  Administered 2018-07-01: 15:00:00 8 mL via INTRAVENOUS

## 2018-07-01 MED ORDER — POTASSIUM CHLORIDE CRYS ER 20 MEQ PO TBCR
40.0000 meq | EXTENDED_RELEASE_TABLET | Freq: Once | ORAL | Status: AC
  Administered 2018-07-01: 40 meq via ORAL
  Filled 2018-07-01: qty 2

## 2018-07-01 MED ORDER — PHENAZOPYRIDINE HCL 100 MG PO TABS
100.0000 mg | ORAL_TABLET | Freq: Once | ORAL | Status: AC
  Administered 2018-07-02: 02:00:00 100 mg via ORAL
  Filled 2018-07-01 (×2): qty 1

## 2018-07-01 NOTE — Progress Notes (Signed)
PROGRESS NOTE   Micheal [name]  JHE:[id number]    DOB: 1928/08/03    DOA: 06/28/2018  PCP: Gayland Curry, DO   I have briefly reviewed patients previous medical records in [facility].  Brief Narrative:  85 year old male with PMH of GERD, IBS, prostate cancer status post radiation, HLD, PSVT, RLS, incomplete RBBB, DM 2, HTN, chronic lower extremity pain, recent left leg puncture wound by arrested rake, presented to [facility] ED on 06/28/2018 due to fever, shaking chills and some cough.  Admitted for febrile illness without clear source/SIRS, evaluation ongoing, empirically on IV vancomycin and cefepime.  On 6/7 evening patient deteriorated with high fever/103.6 F, hypotension, transferred to progressive care unit.  Improved.  ID consulted.  Defervesced.  MRI LS spine without acute findings but MRI sacrum showed cellulitis.   Assessment & Plan:   Principal Problem:   Fever Active Problems:   Malignant neoplasm of prostate (HCC)   Essential hypertension   DM (diabetes mellitus), type 2 with peripheral vascular complications (HCC)   Chronic cervical radiculopathy   SIRS (systemic inflammatory response syndrome) (HCC)   Complaints of weakness of lower extremity   Open wound of lower extremity   Fever/SIRS of undetermined source  Presented with 2 days history of fever and shaking chills.  Met SIRS criteria on admission.  No clear source clinically or on evaluation thus far.  Neutrophilic [id number]/37 K+.  Lactate normal.  Chest x-ray clear x2.  UA unremarkable.  SARS coronavirus 2 testing negative.  RVP negative.  Procalcitonin: 0.11.  Left leg soft tissue ultrasound without abscess.  MRSA PCR negative.  Blood cultures x4: Negative to date.  Received tetanus booster shot in ED.  Became hypotensive 6/7, bolused with IV fluids, some wheezing although no clinical or radiological pulmonary edema, reduced IV fluids and then stopped today.  IV vancomycin, cefepime >Fortaz, Flagyl, now  changed to vancomycin and ceftriaxone by ID.  ID consultation appreciated, due to his complaints of significant pain from hip down (although he reported this to be mostly chronic), left leg wound with purulence today, checked MRI of LS-spine which was negative for acute findings but sacral MRI showed findings suggestive of cellulitis without abscess, septic joint or osteomyelitis.    Defervesced.  Improved.  Type II DM/IDDM  Latest A1c: 7.8.  Currently on Lantus 35 units daily and NovoLog SSI.  Good inpatient control.  Patient states that at home he takes both Lantus 45 units in the morning and Humulin 70/30 units in the evening which can cause significant issues with hypoglycemia and will likely stop Humulin at discharge and may need to be substituted with short-acting insulin.  Essential hypertension  Hypotensive on 6/7 related to high fevers.  Metoprolol was discontinued.  Normotensive off of antihypertensives.  Continue to monitor.  Prostate cancer status post radiation/BPH  Continue Flomax.  Outpatient follow-up with urology, reportedly has had low PSAs.  Patient reports dysuria, started by radium PRN.  GERD  Continue Pepcid  Anemia  Stable.  Thrombocytopenia  Possibly related to acute infectious etiology.  Improving.  Acute kidney injury  Possibly due to acute febrile illness and insensible fluid losses.  Resolved after IV fluids.  IV fluids discontinued.   DVT prophylaxis: Lovenox Code Status: Full Family Communication: Discussed extensively with patient's daughter 6/8, updated care and answered questions. Disposition: To be determined pending clinical improvement, possibly 6/10 pending ID input.   Consultants:  Infectious disease  Procedures:  None  Antimicrobials:  IV cefepime, Zosyn, and  Flagyl-discontinued IV vancomycin & ceftriaxone   Subjective: Overall feels much better.  Pain around his waist going down to his legs has  improved/controlled on medications.  No fevers.  ROS: As above, otherwise negative.  Objective:  Vitals:   07/01/18 0819 07/01/18 1147 07/01/18 1148 07/01/18 1701  BP: 123/69 120/64 120/64 119/60  Pulse: (!) 104  (!) 102 99  Resp: _0 Temp:  98.5 F (36.9 C)    TempSrc:  Oral    SpO2: 97%  96% 95%  Weight:      Height:        Examination:  General exam: Pleasant elderly male, moderately built and nourished looks much better compared to the last 48 hours.  Sitting up comfortably on chair without distress. Respiratory system: Clear to auscultation without wheezing, rhonchi or crackles.  No increased work of breathing. Cardiovascular system: S1 & S2 heard, RRR. No JVD, murmurs, rubs, gallops or clicks. No pedal edema.  Telemetry personally reviewed: SR-ST in the 100s/110s. Gastrointestinal system: Abdomen is nondistended, soft and nontender. No organomegaly or masses felt. Normal bowel sounds heard.  Stable Central nervous system: Alert and oriented. No focal neurological deficits.  Stable Extremities: Symmetric 5 x 5 power. Skin: Lateral aspect of left lower leg noted tiny area of superficial wound approximately 0.5 cm in diameter without obvious purulence today without surrounding erythema but mild surrounding tenderness and no fluctuation. Psychiatry: Judgement and insight appear normal. Mood & affect appropriate.     Data Reviewed: I have personally reviewed following labs and imaging studies  CBC: Recent Labs  Lab 06/28/18 2330 06/29/18 0559 06/30/18 0307 07/01/18 0201  WBC 13.9* 18.0* 15.7* 10.5  NEUTROABS 11.7* 15.0* 14.0*  --   HGB 12.8* 11.2* 11.1* 11.1*  HCT 38.4* 32.9* 33.4* 32.6*  MCV 90.6 90.1 90.8 89.3  PLT 197 155 127* 419*   Basic Metabolic Panel: Recent Labs  Lab 06/28/18 2330 06/29/18 0559 06/30/18 0307 07/01/18 0201  NA 136 139 136 134*  K 4.3 4.2 3.5 3.4*  CL 107 108 109 104  CO2 19* 20* 19* 19*  GLUCOSE 180* 150* 138* 99  BUN _1 CREATININE 1.23 1.05 1.16 1.15  CALCIUM 9.4 8.5* 8.0* 8.1*   Liver Function Tests: Recent Labs  Lab 06/28/18 2330  AST 24  ALT 20  ALKPHOS 63  BILITOT 0.7  PROT 6.2*  ALBUMIN 4.1   CBG: Recent Labs  Lab 06/30/18 1640 06/30/18 2149 07/01/18 0758 07/01/18 1146 07/01/18 1659  GLUCAP 126* 94 84 105* 118*    Recent Results (from the past 240 hour(s))  SARS Coronavirus 2 (CEPHEID- Performed in [place] hospital lab), Hosp Order     Status: None   Collection Time: 06/29/18  1:57 AM  Result Value Ref Range Status   SARS Coronavirus 2 NEGATIVE NEGATIVE Final    Comment: (NOTE) If result is NEGATIVE SARS-CoV-2 target nucleic acids are NOT DETECTED. The SARS-CoV-2 RNA is generally detectable in upper and lower  respiratory specimens during the acute phase of infection. The lowest  concentration of SARS-CoV-2 viral copies this assay can detect is 250  copies / mL. A negative result does not preclude SARS-CoV-2 infection  and should not be used as the sole basis for treatment or other  patient management decisions.  A negative result may occur with  improper specimen collection / handling, submission of specimen other  than nasopharyngeal swab, presence of viral mutation(s) within the  areas targeted  by this assay, and inadequate number of viral copies  (<250 copies / mL). A negative result must be combined with clinical  observations, patient history, and epidemiological information. If result is POSITIVE SARS-CoV-2 target nucleic acids are DETECTED. The SARS-CoV-2 RNA is generally detectable in upper and lower  respiratory specimens dur ing the acute phase of infection.  Positive  results are indicative of active infection with SARS-CoV-2.  Clinical  correlation with patient history and other diagnostic information is  necessary to determine patient infection status.  Positive results do  not rule out bacterial infection or co-infection with other viruses. If  result is PRESUMPTIVE POSTIVE SARS-CoV-2 nucleic acids MAY BE PRESENT.   A presumptive positive result was obtained on the submitted specimen  and confirmed on repeat testing.  While 2019 novel coronavirus  (SARS-CoV-2) nucleic acids may be present in the submitted sample  additional confirmatory testing may be necessary for epidemiological  and / or clinical management purposes  to differentiate between  SARS-CoV-2 and other Sarbecovirus currently known to infect humans.  If clinically indicated additional testing with an alternate test  methodology [phone]) is advised. The SARS-CoV-2 RNA is generally  detectable in upper and lower respiratory sp ecimens during the acute  phase of infection. The expected result is Negative. Fact Sheet for Patients:  StrictlyIdeas.no Fact Sheet for Healthcare Providers: BankingDealers.co.za This test is not yet approved or cleared by the [place] FDA and has been authorized for detection and/or diagnosis of SARS-CoV-2 by FDA under an Emergency Use Authorization (EUA).  This EUA will remain in effect (meaning this test can be used) for the duration of the COVID-19 declaration under Section 564(b)(1) of the Act, 21 U.S.C. section 360bbb-3(b)(1), unless the authorization is terminated or revoked sooner. Performed at [facility], [place] [address], [place], [place] [address]   Blood culture (routine x 2)     Status: None (Preliminary result)   Collection Time: 06/29/18  2:35 AM  Result Value Ref Range Status   Specimen Description BLOOD RIGHT HAND  Final   Special Requests   Final    BOTTLES DRAWN AEROBIC AND ANAEROBIC Blood Culture adequate volume   Culture   Final    NO GROWTH 2 DAYS Performed at [facility], [place] [address], [place], [place] [address]    Report Status PENDING  Incomplete  Blood culture (routine x 2)     Status: None (Preliminary result)   Collection Time: 06/29/18   2:40 AM  Result Value Ref Range Status   Specimen Description BLOOD LEFT ANTECUBITAL  Final   Special Requests   Final    BOTTLES DRAWN AEROBIC AND ANAEROBIC Blood Culture adequate volume   Culture   Final    NO GROWTH 2 DAYS Performed at [facility], [place] [address] [place], [place] [address]    Report Status PENDING  Incomplete  MRSA PCR Screening     Status: None   Collection Time: 06/29/18 10:50 AM  Result Value Ref Range Status   MRSA by PCR NEGATIVE NEGATIVE Final    Comment:        The GeneXpert MRSA Assay (FDA approved for NASAL specimens only), is one component of a comprehensive MRSA colonization surveillance program. It is not intended to diagnose MRSA infection nor to guide or monitor treatment for MRSA infections. Performed at [facility], [place] [address], [place], [place] [address]   Respiratory Panel by PCR     Status:  None   Collection Time: 06/29/18  2:06 PM  Result Value Ref Range Status   Adenovirus NOT DETECTED NOT DETECTED Final   Coronavirus 229E NOT DETECTED NOT DETECTED Final    Comment: (NOTE) The Coronavirus on the Respiratory Panel, DOES NOT test for the novel  Coronavirus (2019 nCoV)    Coronavirus HKU1 NOT DETECTED NOT DETECTED Final   Coronavirus NL63 NOT DETECTED NOT DETECTED Final   Coronavirus OC43 NOT DETECTED NOT DETECTED Final   Metapneumovirus NOT DETECTED NOT DETECTED Final   Rhinovirus / Enterovirus NOT DETECTED NOT DETECTED Final   Influenza A NOT DETECTED NOT DETECTED Final   Influenza B NOT DETECTED NOT DETECTED Final   Parainfluenza Virus 1 NOT DETECTED NOT DETECTED Final   Parainfluenza Virus 2 NOT DETECTED NOT DETECTED Final   Parainfluenza Virus 3 NOT DETECTED NOT DETECTED Final   Parainfluenza Virus 4 NOT DETECTED NOT DETECTED Final   Respiratory Syncytial Virus NOT DETECTED NOT DETECTED Final   Bordetella pertussis NOT DETECTED NOT DETECTED Final   Chlamydophila pneumoniae NOT DETECTED NOT DETECTED Final    Mycoplasma pneumoniae NOT DETECTED NOT DETECTED Final    Comment: Performed at [facility], [place] [address], [place], [place] [address]  Culture, blood (Routine X 2) w Reflex to ID Panel     Status: None (Preliminary result)   Collection Time: 06/29/18  6:34 PM  Result Value Ref Range Status   Specimen Description BLOOD RIGHT ANTECUBITAL  Final   Special Requests   Final    BOTTLES DRAWN AEROBIC ONLY Blood Culture adequate volume   Culture   Final    NO GROWTH 2 DAYS Performed at [facility], [place] [address], [place], [place] [address]    Report Status PENDING  Incomplete  Culture, blood (Routine X 2) w Reflex to ID Panel     Status: None (Preliminary result)   Collection Time: 06/29/18  6:34 PM  Result Value Ref Range Status   Specimen Description BLOOD RIGHT ANTECUBITAL  Final   Special Requests   Final    BOTTLES DRAWN AEROBIC ONLY Blood Culture results may not be optimal due to an excessive volume of blood received in culture bottles   Culture   Final    NO GROWTH 2 DAYS Performed at [facility], [place] [address], [place], [place] [address]    Report Status PENDING  Incomplete         Radiology Studies: Mr Lumbar Spine W Wo Contrast  Result Date: 07/01/2018 CLINICAL DATA:  Fever, pain, bilateral hip pain and back pain EXAM: MRI LUMBAR SPINE WITHOUT AND WITH CONTRAST TECHNIQUE: Multiplanar and multiecho pulse sequences of the lumbar spine were obtained without and with intravenous contrast. CONTRAST:  8 mL Gadavist COMPARISON:  None. FINDINGS: Segmentation:  Standard. Alignment: 2 mm retrolisthesis L2 on L3. Minimal grade 1 anterolisthesis of L5 on S1. Vertebrae:  No fracture, evidence of discitis, or bone lesion. Conus medullaris and cauda equina: Conus extends to the L1 level. Conus and cauda equina appear normal. Paraspinal and other soft tissues: No acute paraspinal abnormality. Disc levels: Disc spaces: Degenerative disease with disc height loss at L2-3. Disc  desiccation at L4-5 and L5-S1. left renal cyst. T12-L1: No significant disc bulge. No evidence of neural foraminal stenosis. No central canal stenosis. L1-L2: Minimal broad-based disc bulge. Moderate bilateral facet arthropathy. No evidence of neural foraminal stenosis. No central canal stenosis. L2-L3: Mild broad-based disc bulge. Moderate bilateral facet arthropathy. No evidence  of neural foraminal stenosis. No central canal stenosis. L3-L4: Broad-based disc bulge. Moderate bilateral facet arthropathy. Bilateral lateral recess narrowing. No evidence of neural foraminal stenosis. No central canal stenosis. L4-L5: Broad-based disc bulge. Moderate bilateral facet arthropathy. Mild bilateral foraminal narrowing. No central canal stenosis. L5-S1: Minimal broad-based disc bulge. Moderate bilateral facet arthropathy. No evidence of neural foraminal stenosis. No central canal stenosis. IMPRESSION: 1.  No acute osseous injury of the lumbar spine. 2. Lumbar spine spondylosis as described above. Electronically Signed   By: Kathreen Devoid   On: 07/01/2018 15:32   Mr Sacrum Si Joints W Wo Contrast  Result Date: 07/01/2018 CLINICAL DATA:  Right hip pain, lower extremity weakness and fever. EXAM: MRI SACRUM WITHOUT AND WITH CONTRAST TECHNIQUE: Multiplanar, multisequence MR imaging of the sacrum was performed both before and after administration of intravenous contrast. CONTRAST:  8 cc Gadavist IV COMPARISON:  None. FINDINGS: Bones/Joint/Cartilage Marrow signal is normal without evidence of osteomyelitis, fracture or stress change. Sacroiliac joints are unremarkable. Ligaments Intact. Muscles and Tendons Intact.  No intramuscular fluid collection or edema. Soft tissues Subcutaneous edema and mild enhancement are seen in the soft tissues posterior to the sacrum suggestive of cellulitis. No abscess. Sacral nerve plexus appears normal. IMPRESSION: Findings suggestive of cellulitis in the subcutaneous fatty tissues posterior to the  sacrum. Negative for abscess, septic joint or osteomyelitis. Electronically Signed   By: Inge Rise M.D.   On: 07/01/2018 15:34   [place] Lt Lower Extrem Ltd Soft Tissue Non Vascular  Result Date: 06/29/2018 CLINICAL DATA:  85 year old male with history of puncture wound in the mid lateral left leg. High fevers. Evaluate for potential abscess. EXAM: ULTRASOUND LEFT LOWER EXTREMITY LIMITED TECHNIQUE: Ultrasound examination of the lower extremity soft tissues was performed in the area of clinical concern. COMPARISON:  NO PRIORS. FINDINGS: Joint Space: N/A. Muscles: Normal. Tendons: Normal Other Soft Tissue Structures: Normal. IMPRESSION: 1. No acute findings in the area of concern. Specifically, no focal fluid collection to suggest abscess. Electronically Signed   By: Vinnie Langton M.D.   On: 06/29/2018 19:59        Scheduled Meds:  acidophilus  1 capsule Oral Daily   enoxaparin (LOVENOX) injection  40 mg Subcutaneous Daily   gabapentin  300 mg Oral BID   insulin aspart  0-5 Units Subcutaneous QHS   insulin aspart  0-9 Units Subcutaneous TID WC   insulin glargine  35 Units Subcutaneous Daily   tamsulosin  0.4 mg Oral Daily   Continuous Infusions:  cefTRIAXone (ROCEPHIN)  IV 2 g (07/01/18 1334)   vancomycin 1,000 mg (06/30/18 2147)     LOS: 2 days     Micheal Leep, MD, FACP, [facility]. [facility]  To contact the attending provider between 7A-7P or the covering provider during after hours 7P-7A, please log into the web site [email or web address] and access using universal [place] password for that web site. If you do not have the password, please call the hospital operator.  07/01/2018, 6:50 PM

## 2018-07-01 NOTE — Progress Notes (Signed)
PT Cancellation Note  Patient Details Name: Micheal [name] MRN: [id number] DOB: Mar 02, 1928   Cancelled Treatment:    Reason Eval/Treat Not Completed: Patient at procedure or test/unavailable, at MRI. Will check back as time allows.   Leighton Roach, [place]  Pager [phone] Office [place] 07/01/2018, 3:13 PM

## 2018-07-01 NOTE — Progress Notes (Signed)
Physical Therapy Treatment Patient Details Name: Micheal [name] MRN: [id number] DOB: 07-13-1928 Today's Date: 07/01/2018    History of Present Illness 85 year old male with PMH of GERD, IBS, prostate cancer status post radiation, HLD, PSVT, RLS, incomplete RBBB, DM 2, HTN, chronic lower extremity pain, recent left leg puncture wound by arrested rake, presented to [facility] ED on 06/28/2018 due to fever, shaking chills and some cough.  Admitted for febrile illness without clear source/SIRS, evaluation ongoing, empirically on IV vancomycin and cefepime.  On 6/7 evening patient deteriorated with high fever/103.6 F, hypotension, transferred to progressive care unit.      PT Comments    Pt progressing with ambulation distance today, walked 64' with RW and min-guard. Pt continues to have difficulty rising from chair, especially low surface. Discussed possibility of lift chair for home. PT will continue to follow.    Follow Up Recommendations  Home health PT;Supervision/Assistance - 24 hour(HHOT, HHAide)     Equipment Recommendations  Rolling walker with 5" wheels    Recommendations for Other Services       Precautions / Restrictions Precautions Precautions: Fall Restrictions Weight Bearing Restrictions: No    Mobility  Bed Mobility Overal bed mobility: Needs Assistance Bed Mobility: Supine to Sit;Sit to Supine     Supine to sit: Min guard Sit to supine: Min assist   General bed mobility comments: pt able to get to EOB without physical assist but HOB elevated and increased time needed. Min A to LE's for return to supine  Transfers Overall transfer level: Needs assistance Equipment used: Rolling walker (2 wheeled) Transfers: Sit to/from Stand Sit to Stand: Min assist;Mod assist         General transfer comment: min A from bed for last 20% of standing, mod A from low toilet with use of grab bar.   Ambulation/Gait Ambulation/Gait assistance: Min guard Gait Distance (Feet): 60  Feet Assistive device: Rolling walker (2 wheeled) Gait Pattern/deviations: Step-through pattern;Decreased stride length;Shuffle Gait velocity: decreased Gait velocity interpretation: <1.8 ft/sec, indicate of risk for recurrent falls General Gait Details: pt tolerated increased distance today, c/o LE weakness below the knees. Heavy stepping.    Stairs             Wheelchair Mobility    Modified Rankin (Stroke Patients Only)       Balance Overall balance assessment: Needs assistance Sitting-balance support: No upper extremity supported;Feet supported Sitting balance-Leahy Scale: Fair     Standing balance support: Bilateral upper extremity supported;During functional activity Standing balance-Leahy Scale: Poor Standing balance comment: reliant on RW                            Cognition Arousal/Alertness: Awake/alert Behavior During Therapy: WFL for tasks assessed/performed Overall Cognitive Status: Within Functional Limits for tasks assessed                                        Exercises      General Comments General comments (skin integrity, edema, etc.): SpO2 99% on RA, HR 106 bpm after activity      Pertinent Vitals/Pain Pain Assessment: Faces Faces Pain Scale: Hurts even more Pain Location: low back and LE's Pain Descriptors / Indicators: Aching Pain Intervention(s): Limited activity within patient's tolerance;Monitored during session    Home Living  Prior Function            PT Goals (current goals can now be found in the care plan section) Acute Rehab PT Goals Patient Stated Goal: to go home PT Goal Formulation: With patient Time For Goal Achievement: 07/14/18 Potential to Achieve Goals: Good Progress towards PT goals: Progressing toward goals    Frequency    Min 3X/week      PT Plan Current plan remains appropriate    Co-evaluation              AM-PAC PT "6 Clicks"  Mobility   Outcome Measure  Help needed turning from your back to your side while in a flat bed without using bedrails?: A Lot Help needed moving from lying on your back to sitting on the side of a flat bed without using bedrails?: A Little Help needed moving to and from a bed to a chair (including a wheelchair)?: A Little Help needed standing up from a chair using your arms (e.g., wheelchair or bedside chair)?: A Little Help needed to walk in hospital room?: A Little Help needed climbing 3-5 steps with a railing? : A Little 6 Click Score: 17    End of Session Equipment Utilized During Treatment: Gait belt Activity Tolerance: Patient tolerated treatment well Patient left: with call bell/phone within reach;in bed;with bed alarm set Nurse Communication: Mobility status PT Visit Diagnosis: Other abnormalities of gait and mobility (R26.89);Muscle weakness (generalized) (M62.81)     Time: 2774-1287 PT Time Calculation (min) (ACUTE ONLY): 21 min  Charges:  $Gait Training: 8-22 mins                     Leighton Roach, [place]  Pager [phone] Office [place] 07/01/2018, 4:52 PM

## 2018-07-02 ENCOUNTER — Inpatient Hospital Stay (HOSPITAL_COMMUNITY): Payer: Medicare Other

## 2018-07-02 DIAGNOSIS — L03312 Cellulitis of back [any part except buttock]: Secondary | ICD-10-CM

## 2018-07-02 DIAGNOSIS — R29898 Other symptoms and signs involving the musculoskeletal system: Secondary | ICD-10-CM

## 2018-07-02 DIAGNOSIS — L089 Local infection of the skin and subcutaneous tissue, unspecified: Secondary | ICD-10-CM

## 2018-07-02 DIAGNOSIS — S81802S Unspecified open wound, left lower leg, sequela: Secondary | ICD-10-CM

## 2018-07-02 LAB — BASIC METABOLIC PANEL
Anion gap: 7 (ref 5–15)
BUN: 10 mg/dL (ref 8–23)
CO2: 21 mmol/L — ABNORMAL LOW (ref 22–32)
Calcium: 7.9 mg/dL — ABNORMAL LOW (ref 8.9–10.3)
Chloride: 107 mmol/L (ref 98–111)
Creatinine, Ser: 0.81 mg/dL (ref 0.61–1.24)
GFR calc Af Amer: >60 mL/min (ref >60)
GFR calc non Af Amer: >60 mL/min (ref >60)
Glucose, Bld: 157 mg/dL — ABNORMAL HIGH (ref 70–99)
Potassium: 3.3 mmol/L — ABNORMAL LOW (ref 3.5–5.1)
Sodium: 135 mmol/L (ref 135–145)

## 2018-07-02 LAB — CBC
HCT: 31.4 % — ABNORMAL LOW (ref 39.0–52.0)
Hemoglobin: 10.6 g/dL — ABNORMAL LOW (ref 13.0–17.0)
MCH: 30 pg (ref 26.0–34.0)
MCHC: 33.8 g/dL (ref 30.0–36.0)
MCV: 89 fL (ref 80.0–100.0)
Platelets: 137 10*3/uL — ABNORMAL LOW (ref 150–400)
RBC: 3.53 MIL/uL — ABNORMAL LOW (ref 4.22–5.81)
RDW: 12 % (ref 11.5–15.5)
WBC: 4.8 10*3/uL (ref 4.0–10.5)
nRBC: 0 % (ref 0.0–0.2)

## 2018-07-02 LAB — GLUCOSE, CAPILLARY
Glucose-Capillary: 108 mg/dL — ABNORMAL HIGH (ref 70–99)
Glucose-Capillary: 149 mg/dL — ABNORMAL HIGH (ref 70–99)

## 2018-07-02 MED ORDER — POTASSIUM CHLORIDE CRYS ER 20 MEQ PO TBCR
20.0000 meq | EXTENDED_RELEASE_TABLET | Freq: Once | ORAL | Status: AC
  Administered 2018-07-02: 15:00:00 20 meq via ORAL
  Filled 2018-07-02: qty 1

## 2018-07-02 MED ORDER — CEPHALEXIN 500 MG PO CAPS: 500.0000 mg | ORAL_CAPSULE | Freq: Four times a day (QID) | ORAL | 0 refills | Status: AC

## 2018-07-02 NOTE — Progress Notes (Signed)
Pharmacy Antibiotic Note  Micheal [name] is a 85 y.o. male admitted on 06/28/2018 with no clear source of infection other than sacral cellulitis.  Pharmacy has been consulted for Vancomycin dosing.  Plan: Continue Vancomycin 1000mg  IV q24h Monitor clinical course, and de-escalation plans  Height: 5\' 7"  (170.2 cm) Weight: 160 lb 7.9 oz (72.8 kg) IBW/kg (Calculated) : 66.1  Temp (24hrs), Avg:98.6 F (37 C), Min:98.5 F (36.9 C), Max:98.8 F (37.1 C)  Recent Labs  Lab 06/28/18 2330 06/29/18 0559 06/29/18 1834 06/29/18 2143 06/30/18 0307 07/01/18 0201 07/02/18 0323  WBC 13.9* 18.0*  --   --  15.7* 10.5 4.8  CREATININE 1.23 1.05  --   --  1.16 1.15 0.81  LATICACIDVEN 1.3 1.0 1.1 1.0  --   --   --     Estimated Creatinine Clearance: 58.9 mL/min (by C-G formula based on SCr of 0.81 mg/dL).    Allergies  Allergen Reactions  . Aspirin Other (See Comments)    Bothers stomach, thins blood   . Atorvastatin Other (See Comments)    Muscular pain and weakness  . Prednisone Other (See Comments)    Upset stomach, can take by shot not by mouth  . Simvastatin Other (See Comments)    Muscular pain and weakness  . Sulfa Antibiotics Other (See Comments)    Upset stomach   . Latex Rash  . Metformin Hcl Nausea Only  . Voltaren [Diclofenac Sodium] Other (See Comments)    Unknown   Minnie Legros A. Levada Dy, PharmD, [place] [facility] Please utilize Amion for appropriate phone number to reach the unit pharmacist ([place])    07/02/2018 8:35 AM

## 2018-07-02 NOTE — Care Management (Signed)
[place] [phone]   [place] to my Favorites  Quality of Patient Care Rating3 out of 5 stars Patient Survey Summary Rating4 out of [place] [phone]   [place] to my Favorites  Quality of Patient Care Rating4 out of 5 stars Patient Survey Summary Rating4 out of [place] [phone]   Add [place] to my Favorites  Quality of Patient Care Rating4 out of 5 stars Patient Survey Summary Rating4 out of 5 stars  [place] [phone]   Add [place] to my Favorites  Quality of Patient Care Rating4 out of 5 stars Patient Survey Summary Rating4 out of 5 stars  [place] [phone]   [place] to my Favorites  Quality of Patient Care Rating4 out of 5 stars Patient Survey Summary Rating4 out of [place] [phone]   [place] to my [place] out of 5 stars Patient Survey Summary Rating4 out of [place] [phone]   [place] to my Favorites  Quality of Patient Care Rating3 out of 5 stars Patient Survey Summary [place] out of 5 stars  [facility] [phone]   Add [facility] to my Favorites  Quality of Patient Care Rating4 out of 5 stars Not [place] [phone]   [place] to my Favorites  Quality of Patient [place] out of 5 stars Patient Survey Summary Rating4 out of 5 stars  [facility] [phone]   Add [facility] to my Favorites  Quality of Patient Care Rating3  out of 5 stars Patient Survey Summary Rating3 out of [place] [phone]   [place] to my  [place]  out of 5 stars Patient Survey Summary Rating3 out of [place] [phone]   Add [facility] to my Favorites  Quality of Patient [place] out of 5 stars Patient Survey Summary Rating5 out of [place] [phone]   [place] to my Favorites  Quality of Patient [place]  out of 5 stars Patient Survey Summary Rating3 out of 5 stars  Viewing 1 - 13 of 13 results

## 2018-07-02 NOTE — Progress Notes (Signed)
Patient was discharged home with home health  by MD order; discharged instructions review and give to patient and his daughter with care notes; IV DIC; walker rolling was delivered to the patient's room;  patient will be escorted to the car by nurse tech via wheelchair.

## 2018-07-02 NOTE — Progress Notes (Signed)
[name] for Infectious Disease  Date of Admission:  06/28/2018      Total days of antibiotics 5  Patient ID: Micheal [name] is a 85 y.o. male with  Principal Problem:   Fever Active Problems:   Complaints of weakness of lower extremity   Open wound of lower extremity   DM (diabetes mellitus), type 2 with peripheral vascular complications (HCC)   Malignant neoplasm of prostate (HCC)   Essential hypertension   Chronic cervical radiculopathy   SIRS (systemic inflammatory response syndrome) ([place])   . acidophilus  1 capsule Oral Daily  . enoxaparin (LOVENOX) injection  40 mg Subcutaneous Daily  . gabapentin  300 mg Oral BID  . insulin aspart  0-5 Units Subcutaneous QHS  . insulin aspart  0-9 Units Subcutaneous TID WC  . insulin glargine  35 Units Subcutaneous Daily  . tamsulosin  0.4 mg Oral Daily    SUBJECTIVE: Feeling better today. Appetite coming back and finally feels like his food "tastes good." Still with pain at the left lower posterior leg. Frustrated about insulin regimen being different.   Noted to be afebrile with resolution of his leukocytosis.   Review of Systems: Review of Systems  Constitutional: Negative for chills and fever.  Gastrointestinal: Negative for diarrhea, nausea and vomiting.  Musculoskeletal:       LLE posterior calf pain surrounding wound.   Skin: Negative for rash.    Allergies  Allergen Reactions  . Aspirin Other (See Comments)    Bothers stomach, thins blood   . Atorvastatin Other (See Comments)    Muscular pain and weakness  . Prednisone Other (See Comments)    Upset stomach, can take by shot not by mouth  . Simvastatin Other (See Comments)    Muscular pain and weakness  . Sulfa Antibiotics Other (See Comments)    Upset stomach   . Latex Rash  . Metformin Hcl Nausea Only  . Voltaren [Diclofenac Sodium] Other (See Comments)    Unknown    OBJECTIVE: Vitals:   07/02/18 0147 07/02/18 0247 07/02/18 0359 07/02/18  0400  BP:    (!) 106/46  Pulse: 99 91  88  Resp: 14 18  11   Temp:   98.5 F (36.9 C) 98.5 F (36.9 C)  TempSrc:   Oral Oral  SpO2: 94% (!) 89%  93%  Weight:      Height:       Body mass index is 25.14 kg/m.  Physical Exam Constitutional:      Appearance: Normal appearance.     Comments: Sitting up in bed eating lunch. Well appearing.   HENT:     Mouth/Throat:     Mouth: Mucous membranes are moist.     Pharynx: Oropharynx is clear.  Cardiovascular:     Rate and Rhythm: Normal rate and regular rhythm.  Pulmonary:     Effort: Pulmonary effort is normal.  Abdominal:     General: Bowel sounds are normal.  Musculoskeletal:     Comments: Left lower extremity wound still with erythema and tenderness.  Unable to express purulent drainage today.  Neurological:     Mental Status: He is alert.     Lab Results Lab Results  Component Value Date   WBC 4.8 07/02/2018   HGB 10.6 (L) 07/02/2018   HCT 31.4 (L) 07/02/2018   MCV 89.0 07/02/2018   PLT 137 (L) 07/02/2018    Lab Results  Component Value Date   CREATININE 0.81  07/02/2018   BUN 10 07/02/2018   NA 135 07/02/2018   K 3.3 (L) 07/02/2018   CL 107 07/02/2018   CO2 21 (L) 07/02/2018    Lab Results  Component Value Date   ALT 20 06/28/2018   AST 24 06/28/2018   ALKPHOS 63 06/28/2018   BILITOT 0.7 06/28/2018     Microbiology: Recent Results (from the past 240 hour(s))  SARS Coronavirus 2 (CEPHEID- Performed in [facility] hospital lab), Hosp Order     Status: None   Collection Time: 06/29/18  1:57 AM  Result Value Ref Range Status   SARS Coronavirus 2 NEGATIVE NEGATIVE Final    Comment: (NOTE) If result is NEGATIVE SARS-CoV-2 target nucleic acids are NOT DETECTED. The SARS-CoV-2 RNA is generally detectable in upper and lower  respiratory specimens during the acute phase of infection. The lowest  concentration of SARS-CoV-2 viral copies this assay can detect is 250  copies / mL. A negative result does not  preclude SARS-CoV-2 infection  and should not be used as the sole basis for treatment or other  patient management decisions.  A negative result may occur with  improper specimen collection / handling, submission of specimen other  than nasopharyngeal swab, presence of viral mutation(s) within the  areas targeted by this assay, and inadequate number of viral copies  (<250 copies / mL). A negative result must be combined with clinical  observations, patient history, and epidemiological information. If result is POSITIVE SARS-CoV-2 target nucleic acids are DETECTED. The SARS-CoV-2 RNA is generally detectable in upper and lower  respiratory specimens dur ing the acute phase of infection.  Positive  results are indicative of active infection with SARS-CoV-2.  Clinical  correlation with patient history and other diagnostic information is  necessary to determine patient infection status.  Positive results do  not rule out bacterial infection or co-infection with other viruses. If result is PRESUMPTIVE POSTIVE SARS-CoV-2 nucleic acids MAY BE PRESENT.   A presumptive positive result was obtained on the submitted specimen  and confirmed on repeat testing.  While 2019 novel coronavirus  (SARS-CoV-2) nucleic acids may be present in the submitted sample  additional confirmatory testing may be necessary for epidemiological  and / or clinical management purposes  to differentiate between  SARS-CoV-2 and other Sarbecovirus currently known to infect humans.  If clinically indicated additional testing with an alternate test  methodology [id number]) is advised. The SARS-CoV-2 RNA is generally  detectable in upper and lower respiratory sp ecimens during the acute  phase of infection. The expected result is Negative. Fact Sheet for Patients:  StrictlyIdeas.no Fact Sheet for Healthcare Providers: BankingDealers.co.za This test is not yet approved or cleared by  the [place] FDA and has been authorized for detection and/or diagnosis of SARS-CoV-2 by FDA under an Emergency Use Authorization (EUA).  This EUA will remain in effect (meaning this test can be used) for the duration of the COVID-19 declaration under Section 564(b)(1) of the Act, 21 U.S.C. section 360bbb-3(b)(1), unless the authorization is terminated or revoked sooner. Performed at [facility], [place] [address] [place], [place] [address]   Blood culture (routine x 2)     Status: None (Preliminary result)   Collection Time: 06/29/18  2:35 AM  Result Value Ref Range Status   Specimen Description BLOOD RIGHT HAND  Final   Special Requests   Final    BOTTLES DRAWN AEROBIC AND ANAEROBIC Blood Culture adequate volume   Culture   Final  NO GROWTH 3 DAYS Performed at [facility], [place] [address] [place], [place] [address]    Report Status PENDING  Incomplete  Blood culture (routine x 2)     Status: None (Preliminary result)   Collection Time: 06/29/18  2:40 AM  Result Value Ref Range Status   Specimen Description BLOOD LEFT ANTECUBITAL  Final   Special Requests   Final    BOTTLES DRAWN AEROBIC AND ANAEROBIC Blood Culture adequate volume   Culture   Final    NO GROWTH 3 DAYS Performed at [facility], [place] [address], [place], [place] [address]    Report Status PENDING  Incomplete  MRSA PCR Screening     Status: None   Collection Time: 06/29/18 10:50 AM  Result Value Ref Range Status   MRSA by PCR NEGATIVE NEGATIVE Final    Comment:        The GeneXpert MRSA Assay (FDA approved for NASAL specimens only), is one component of a comprehensive MRSA colonization surveillance program. It is not intended to diagnose MRSA infection nor to guide or monitor treatment for MRSA infections. Performed at [facility], [place] [address], [place], [place] [address]   Respiratory Panel by PCR     Status: None   Collection Time: 06/29/18  2:06 PM  Result Value Ref  Range Status   Adenovirus NOT DETECTED NOT DETECTED Final   Coronavirus 229E NOT DETECTED NOT DETECTED Final    Comment: (NOTE) The Coronavirus on the Respiratory Panel, DOES NOT test for the novel  Coronavirus (2019 nCoV)    Coronavirus HKU1 NOT DETECTED NOT DETECTED Final   Coronavirus NL63 NOT DETECTED NOT DETECTED Final   Coronavirus OC43 NOT DETECTED NOT DETECTED Final   Metapneumovirus NOT DETECTED NOT DETECTED Final   Rhinovirus / Enterovirus NOT DETECTED NOT DETECTED Final   Influenza A NOT DETECTED NOT DETECTED Final   Influenza B NOT DETECTED NOT DETECTED Final   Parainfluenza Virus 1 NOT DETECTED NOT DETECTED Final   Parainfluenza Virus 2 NOT DETECTED NOT DETECTED Final   Parainfluenza Virus 3 NOT DETECTED NOT DETECTED Final   Parainfluenza Virus 4 NOT DETECTED NOT DETECTED Final   Respiratory Syncytial Virus NOT DETECTED NOT DETECTED Final   Bordetella pertussis NOT DETECTED NOT DETECTED Final   Chlamydophila pneumoniae NOT DETECTED NOT DETECTED Final   Mycoplasma pneumoniae NOT DETECTED NOT DETECTED Final    Comment: Performed at [facility], [place]. [address], [place], [place] [address]  Culture, blood (Routine X 2) w Reflex to ID Panel     Status: None (Preliminary result)   Collection Time: 06/29/18  6:34 PM  Result Value Ref Range Status   Specimen Description BLOOD RIGHT ANTECUBITAL  Final   Special Requests   Final    BOTTLES DRAWN AEROBIC ONLY Blood Culture adequate volume   Culture   Final    NO GROWTH 3 DAYS Performed at [facility], [place] [address] [place],  [address]    Report Status PENDING  Incomplete  Culture, blood (Routine X 2) w Reflex to ID Panel     Status: None (Preliminary result)   Collection Time: 06/29/18  6:34 PM  Result Value Ref Range Status   Specimen Description BLOOD RIGHT ANTECUBITAL  Final   Special Requests   Final    BOTTLES DRAWN AEROBIC ONLY Blood Culture results may not be optimal due to an excessive volume of  blood received in culture bottles  Culture   Final    NO GROWTH 3 DAYS Performed at [facility], [place] [address], [place], [place] [address]    Report Status PENDING  Incomplete    Assessment & Plan:   1. Fever - seems to be related to soft tissue infection of the left lower leg. Still quite tender. Checking Xray to rule out bone infection. Afebrile with resolved leukocytosis treating this infection. No deep infection identified on MR of the hips/lumbar spine only chronic findings and mentioning of cellulitis overlying sacrum which does not correlate with clinical exam.   2. Purulent Soft Tissue Wound - as above. If negative Xray would transition to oral keflex to complete 2 more days for SSTI. Tetanus up to date.   Can see him back in ID clinic 1-2 weeks after stopping antibiotics to see how he is doing off.   Janene Madeira, MSN, NP-C [facility] for Infectious [place] Pager: [phone]  07/02/2018  11:37 AM

## 2018-07-02 NOTE — TOC Transition Note (Addendum)
Transition of Care [facility]) - CM/SW Discharge Note   Patient Details  Name: Micheal [name] MRN: [id number] Date of Birth: 12/30/1928  [facility]) CM/SW Contact:  Maryclare Labrador, RN Phone Number: 07/02/2018, 3:25 PM   Clinical Narrative:   Pt to discharge home today - wife will be with him at discharge, daughter will transport home.  Pt is interested in [facility] and DME as ordered - CM provided medicare.gov list - pt chose [facility]- agencies contacted and referrals accepted.  No other CM needs determined - CM signing off    Final next level of care: [place] Barriers to Discharge: Barriers Resolved   Patient Goals and CMS Choice Patient states their goals for this hospitalization and ongoing recovery are:: (Pt informed CM that he is ready to get back home to his wife) CMS Medicare.gov Compare Post Acute Care list provided to:: Patient Choice offered to / list presented to : Patient  Discharge Placement                       Discharge Plan and Services                DME Arranged: Walker rolling DME Agency: [facility] Date DME Agency Contacted: 07/02/18 Time DME Agency Contacted: 83 Representative spoke with at DME Agency: [place]: PT, OT, Nurse's Aide [place] Agency: [place] ([place]) Date [place]: 07/02/18 Time [place]: [phone] Representative spoke with at [place]: [place] ([place]) Interventions     Readmission Risk Interventions No flowsheet data found.

## 2018-07-02 NOTE — Care Management Important Message (Signed)
Important Message  Patient Details  Name: Micheal [name] MRN: [id number] Date of Birth: 11-May-1928   Medicare Important Message Given:  Yes    [name] [name] 07/02/2018, 12:32 PM
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
## 2018-07-03 ENCOUNTER — Telehealth: Payer: Self-pay | Admitting: *Deleted

## 2018-07-03 NOTE — Telephone Encounter (Signed)
Transition Care Management Follow-up Telephone Call  Date of discharge and from where: 07/02/18 [place]  How have you been since you were released from the hospital? Wife states he is better and resting  Any questions or concerns? No   Items Reviewed:  Did the pt receive and understand the discharge instructions provided? Yes   Medications obtained and verified? Yes   Any new allergies since your discharge? No   Dietary orders reviewed? Yes  Do you have support at home? Yes  wife  Other (ie: DME, Home Health, etc) Home Health is scheduled to come to home  Functional Questionnaire: (I = Independent and D = Dependent) ADL's: I with Assistance  Bathing/Dressing- I with Assistance   Meal Prep- D wife and family  Eating- I  Maintaining continence- I  Transferring/Ambulation- I with Assistance  Managing Meds- I   Follow up appointments reviewed:    PCP Hospital f/u appt confirmed? Yes  Scheduled to see Janett Billow on 07/10/18   Specialist Hospital f/u appt confirmed? No    Are transportation arrangements needed? No   If their condition worsens, is the pt aware to call  their PCP or go to the ED? Yes  Was the patient provided with contact information for the PCP's office or ED? Yes  Was the pt encouraged to call back with questions or concerns? Yes

## 2018-07-04 LAB — CULTURE, BLOOD (ROUTINE X 2)
Culture: NO GROWTH
Culture: NO GROWTH
Culture: NO GROWTH
Culture: NO GROWTH
Special Requests: ADEQUATE
Special Requests: ADEQUATE
Special Requests: ADEQUATE

## 2018-07-05 DIAGNOSIS — E1140 Type 2 diabetes mellitus with diabetic neuropathy, unspecified: Secondary | ICD-10-CM | POA: Diagnosis not present

## 2018-07-05 DIAGNOSIS — A419 Sepsis, unspecified organism: Secondary | ICD-10-CM | POA: Diagnosis not present

## 2018-07-05 DIAGNOSIS — E1151 Type 2 diabetes mellitus with diabetic peripheral angiopathy without gangrene: Secondary | ICD-10-CM | POA: Diagnosis not present

## 2018-07-05 DIAGNOSIS — Z8546 Personal history of malignant neoplasm of prostate: Secondary | ICD-10-CM | POA: Diagnosis not present

## 2018-07-05 DIAGNOSIS — Z794 Long term (current) use of insulin: Secondary | ICD-10-CM | POA: Diagnosis not present

## 2018-07-05 DIAGNOSIS — I10 Essential (primary) hypertension: Secondary | ICD-10-CM | POA: Diagnosis not present

## 2018-07-05 DIAGNOSIS — D696 Thrombocytopenia, unspecified: Secondary | ICD-10-CM | POA: Diagnosis not present

## 2018-07-05 DIAGNOSIS — S81832D Puncture wound without foreign body, left lower leg, subsequent encounter: Secondary | ICD-10-CM | POA: Diagnosis not present

## 2018-07-05 DIAGNOSIS — W271XXD Contact with garden tool, subsequent encounter: Secondary | ICD-10-CM | POA: Diagnosis not present

## 2018-07-05 DIAGNOSIS — L03116 Cellulitis of left lower limb: Secondary | ICD-10-CM | POA: Diagnosis not present

## 2018-07-08 ENCOUNTER — Ambulatory Visit (INDEPENDENT_AMBULATORY_CARE_PROVIDER_SITE_OTHER): Payer: Medicare Other | Admitting: Family

## 2018-07-08 ENCOUNTER — Telehealth: Payer: Self-pay

## 2018-07-08 ENCOUNTER — Other Ambulatory Visit: Payer: Self-pay

## 2018-07-08 ENCOUNTER — Encounter: Payer: Self-pay | Admitting: Family

## 2018-07-08 VITALS — BP 126/69 | HR 112 | Temp 98.7°F | Wt 155.0 lb

## 2018-07-08 DIAGNOSIS — R5381 Other malaise: Secondary | ICD-10-CM | POA: Diagnosis not present

## 2018-07-08 DIAGNOSIS — S81802D Unspecified open wound, left lower leg, subsequent encounter: Secondary | ICD-10-CM

## 2018-07-08 NOTE — Telephone Encounter (Signed)
Spoke with patient's wife who states patient has had fevers ranging from 100-100.5 starting today. States completed Keflex. Patient to be seen today with Terri Piedra, NP. Patient agreed Eugenia Mcalpine, LPN

## 2018-07-08 NOTE — Assessment & Plan Note (Signed)
Micheal [name] has completed 9 days of antimicrobial therapy for soft tissue infection of the his left lower leg which appears to be healing well with no clear evidence of infection. No induration, redness or swelling is noted on exam. Unlikely a source of his malaise.

## 2018-07-08 NOTE — Progress Notes (Signed)
Subjective:    Patient ID: Micheal [name], male    DOB: 11-19-1928, 85 y.o.   MRN: [id number]  Chief Complaint  Patient presents with  . Soft Tissue infection    HPI:  Micheal [name] is a 85 y.o. male with extensive medical history recently admitted to [facility] on 06/28/18 with fever and chills with associated lower extremity/hip pain and weakness. He was noted to have a puncture wound in the mid-lateral left leg with ultrasound showing no acute findings or fluid collections. MRI of the lumbar spine and sacrum with no osseous injury and findings consistent with cellulitis in the subcutaneous tissues posterior to the sacrum with no abscess, septic join or osteomyelitis. Blood cultures were without growth. He was treated with Keflex for a total of 9 days. All hospital records, lab and imaging were reviewed in detail.  Micheal [name] completed his Keflex as prescribed within the last 24 hours without missed doses. He has had an upset stomach while taking the medications as he does not tolerate many medications easily. This morning he work up feeling malaise and had an increased temperature of 100 and reportedly increased to 101 an hour later. He was given Tylenol and the fever has come down, however he continues to feel malaise. Puncture wound is healing and has scabbed over with no additional drainage and occasional tenderness. He does have some wheezing but no other respiratory symptoms. He does have lower back pain across his back that only is painful on occasion and exacerbated with movement. No urinary symptoms. He is eating okay and not drinking very much water, probably less than 20 oz per day.    Allergies  Allergen Reactions  . Aspirin Other (See Comments)    Bothers stomach, thins blood   . Atorvastatin Other (See Comments)    Muscular pain and weakness  . Prednisone Other (See Comments)    Upset stomach, can take by shot not by mouth  . Simvastatin Other (See Comments)    Muscular pain and weakness  . Sulfa Antibiotics Other (See Comments)    Upset stomach   . Latex Rash  . Metformin Hcl Nausea Only  . Voltaren [Diclofenac Sodium] Other (See Comments)    Unknown      Outpatient Medications Prior to Visit  Medication Sig Dispense Refill  . acetaminophen (TYLENOL) 500 MG tablet Take 1,000 mg by mouth 3 (three) times daily.    . cholecalciferol (VITAMIN D) 400 units TABS tablet Take 400 Units by mouth daily.    Marland Kitchen docusate sodium (COLACE) 100 MG capsule Take 100 mg by mouth daily as needed.     . famotidine (PEPCID AC) 10 MG chewable tablet Chew 10 mg by mouth daily as needed for heartburn.    . gabapentin (NEURONTIN) 300 MG capsule Take 1 capsule (300 mg total) by mouth 2 (two) times daily. 180 capsule 1  . Insulin Glargine (LANTUS SOLOSTAR) 100 UNIT/ML Solostar Pen INJECT 45 UNITS  SUBCUTANEOUSLY IN THE  MORNING (Patient taking differently: Inject 45 Units into the skin every morning. ) 30 mL 5  . Insulin Pen Needle (B-D ULTRAFINE III SHORT PEN) 31G X 8 MM MISC Use twice daily for insulin 180 each 3  . loratadine (CLARITIN) 10 MG tablet Take 10 mg by mouth daily.    . metoprolol tartrate (LOPRESSOR) 50 MG tablet TAKE 1 TABLET BY MOUTH TWO  TIMES DAILY (Patient taking differently: Take 50 mg by mouth 2 (two) times daily. ) 180  tablet 1  . Probiotic Product (PROBIOTIC-10) CHEW Chew 1 each by mouth daily.     . tamsulosin (FLOMAX) 0.4 MG CAPS capsule TAKE 1 CAPSULE BY MOUTH  DAILY FOR PROSTATE (Patient taking differently: Take 0.4 mg by mouth every evening. ) 90 capsule 1  . trolamine salicylate (ASPERCREME) 10 % cream Apply 1 application topically as needed for muscle pain.    Marland Kitchen alum & mag hydroxide-simeth (MAALOX/MYLANTA) 200-200-20 MG/5ML suspension Take 30 mLs by mouth as needed for indigestion or heartburn.     No facility-administered medications prior to visit.      Past Medical History:  Diagnosis Date  . Allergic rhinitis, cause unspecified   .  Carpal tunnel syndrome   . Cervical spondylosis without myelopathy   . Cervical spondylosis without myelopathy   . Cervicalgia   . Diverticulosis of colon (without mention of hemorrhage)   . Esophageal reflux   . Hypertrophy of prostate with urinary obstruction and other lower urinary tract symptoms (LUTS)   . Insomnia, unspecified   . Internal hemorrhoids without mention of complication   . Irritable bowel syndrome   . Macular degeneration (senile) of retina, unspecified   . Malignant neoplasm of prostate ([place])   . Neurogenic bladder, NOS   . Neuropathy   . Nonspecific (abnormal) findings on radiological and other examination of gastrointestinal tract   . Orthostatic hypotension   . Osteoarthrosis, unspecified whether generalized or localized, unspecified site   . Other and unspecified hyperlipidemia   . Other malaise and fatigue   . Other specified disorder of rectum and anus    Radiation Proctitus  . Paroxysmal supraventricular tachycardia ([place])   . Restless legs syndrome (RLS)   . Retinal detachment with retinal defect, unspecified   . Right bundle branch block    Incomplete  . Spermatocele    Right  . Trigger finger (acquired)   . Type II or unspecified type diabetes mellitus without mention of complication, not stated as uncontrolled   . Type II or unspecified type diabetes mellitus without mention of complication, uncontrolled   . Unspecified essential hypertension    patient denies, states he has a "fast heart rate, but no high BP"  . Vitamin D deficiency       Past Surgical History:  Procedure Laterality Date  . Angiolipoma  1980   Right arm  . APPENDECTOMY  1951  . CHOLECYSTECTOMY  01/1994  . COLONOSCOPY  04/11/2010  . COLONOSCOPY W/ POLYPECTOMY  2005   Removed 2 (two) polyps  . DUPUYTREN CONTRACTURE RELEASE Bilateral 1989  . EYE SURGERY  2006   Left  . EYE SURGERY  2006   Retina reattachment, right  . EYE SURGERY  02/1994   Cataract surgery, right  .  EYE SURGERY  01/1995   Cataract surgery, left  . KNEE SURGERY  1979  . PARATHYROIDECTOMY Left 12/27/2016   Procedure: LEFT INFERIOR PARATHYROIDECTOMY;  Surgeon: Armandina Gemma, MD;  Location: [facility];  Service: General;  Laterality: Left;  . POLYPECTOMY  1955   Vocal cord  . RETINAL DETACHMENT SURGERY Right 2005  . SHOULDER ARTHROSCOPY Right 03/22/15   [facility]  . TENDON RELEASE  01/1997   seven fingers  . TRANSURETHRAL RESECTION OF PROSTATE  08/1989  . VITRECTOMY Left 04/23/2013   [facility]      Family History  Problem Relation Age of Onset  . Depression Mother   . Diabetes Mother   . Mental illness Mother  OCD  . Cerebral aneurysm Father        age 9  . Hypertension Sister   . Diabetes Daughter   . Hyperlipidemia Daughter   . Hyperparathyroidism Neg Hx       Social History   Socioeconomic History  . Marital status: Married    Spouse name: Not on file  . Number of children: 4  . Years of education: 10th  . Highest education level: Not on file  Occupational History  . Occupation: Retired  Scientific laboratory technician  . Financial resource strain: Not hard at all  . Food insecurity    Worry: Never true    Inability: Never true  . Transportation needs    Medical: No    Non-medical: No  Tobacco Use  . Smoking status: Never Smoker  . Smokeless tobacco: Never Used  Substance and Sexual Activity  . Alcohol use: No    Alcohol/week: 0.0 standard drinks  . Drug use: No  . Sexual activity: Never  Lifestyle  . Physical activity    Days per week: 7 days    Minutes per session: 30 min  . Stress: Not at all  Relationships  . Social connections    Talks on phone: More than three times a week    Gets together: More than three times a week    Attends religious service: More than 4 times per year    Active member of club or organization: No    Attends meetings of clubs or organizations: Never    Relationship status: Married  . Intimate partner violence    Fear of current or  ex partner: No    Emotionally abused: No    Physically abused: No    Forced sexual activity: No  Other Topics Concern  . Not on file  Social History Narrative   Married   Never smoked   Alcohol none   Exercise walks dog twice a day   Living will   Left-handed   2 cups caffeine per day         Review of Systems  Constitutional: Positive for fatigue and fever. Negative for appetite change, chills and unexpected weight change.  Eyes: Negative for visual disturbance.  Respiratory: Positive for wheezing. Negative for cough, chest tightness and shortness of breath.   Cardiovascular: Negative for chest pain and leg swelling.  Gastrointestinal: Negative for abdominal pain, constipation, diarrhea, nausea and vomiting.  Genitourinary: Negative for dysuria, flank pain, frequency, hematuria and urgency.  Skin: Negative for rash.  Allergic/Immunologic: Negative for immunocompromised state.  Neurological: Positive for light-headedness. Negative for dizziness and headaches.       Objective:    BP 126/69   Pulse (!) 112   Temp 98.7 F (37.1 C) (Oral)   Wt 155 lb (70.3 kg)   BMI 24.28 kg/m  Nursing note and vital signs reviewed.  Physical Exam Constitutional:      General: He is not in acute distress.    Appearance: He is well-developed.     Comments: Elderly gentleman seated in the chair; pleasant; hard of hearing at times.   Cardiovascular:     Rate and Rhythm: Normal rate and regular rhythm.     Heart sounds: Normal heart sounds.  Pulmonary:     Effort: Pulmonary effort is normal.     Breath sounds: Wheezing present.  Skin:    General: Skin is warm and dry.  Neurological:     Mental Status: He is alert and oriented to person,  place, and time.  Psychiatric:        Mood and Affect: Mood normal.         Assessment & Plan:   Problem List Items Addressed This Visit      Other   Open wound of lower extremity    Micheal [name] has completed 9 days of antimicrobial  therapy for soft tissue infection of the his left lower leg which appears to be healing well with no clear evidence of infection. No induration, redness or swelling is noted on exam. Unlikely a source of his malaise.       Malaise - Primary    Micheal [name] has acute onset malaise of unclear origin with differentials being broad. It does not appear that his lower extremity would be source of infection, and previous scans and imaging reviewed without significant findings. I am concerned for dehydration as he does have the back pain as well across his back that is not reproducible on exam. Will check CBC w/diff and CMP. Blood cultures unlikely to provide any information as he has been on antibiotics within the last 24 hours. Recommend staying off antibiotics for the next 24-48. He does have follow up with PCP on 6/18. Advised to seek care if symptoms worsen or do not improve prior to that point.       Relevant Orders   COMPLETE METABOLIC PANEL WITH GFR   CBC w/Diff       I am having Micheal [name] maintain his acetaminophen, Probiotic-10, alum & mag hydroxide-simeth, cholecalciferol, docusate sodium, loratadine, famotidine, trolamine salicylate, Insulin Glargine, Insulin Pen Needle, metoprolol tartrate, tamsulosin, and gabapentin.   Follow-up: Keep appointment with Dr. Linus Salmons for now and if improved can cancel.    Terri Piedra, MSN, FNP-C Nurse Practitioner [facility] [place] [facility] Office phone: [phone] Pager: [place] number: [phone]

## 2018-07-08 NOTE — Patient Instructions (Signed)
Nice to meet you.  We will check your blood work today.  Recommend hydrating with water the best you can. Avoid caffeine.  Continue to follow up with primary care as planned.  If you are not getting any better please let us know.

## 2018-07-08 NOTE — Assessment & Plan Note (Signed)
Micheal [name] has acute onset malaise of unclear origin with differentials being broad. It does not appear that his lower extremity would be source of infection, and previous scans and imaging reviewed without significant findings. I am concerned for dehydration as he does have the back pain as well across his back that is not reproducible on exam. Will check CBC w/diff and CMP. Blood cultures unlikely to provide any information as he has been on antibiotics within the last 24 hours. Recommend staying off antibiotics for the next 24-48. He does have follow up with PCP on 6/18. Advised to seek care if symptoms worsen or do not improve prior to that point.

## 2018-07-09 LAB — COMPLETE METABOLIC PANEL WITH GFR
AG Ratio: 1.9 (calc) (ref 1.0–2.5)
ALT: 25 U/L (ref 9–46)
AST: 14 U/L (ref 10–35)
Albumin: 4 g/dL (ref 3.6–5.1)
Alkaline phosphatase (APISO): 73 U/L (ref 35–144)
BUN/Creatinine Ratio: 16 (calc) (ref 6–22)
BUN: 19 mg/dL (ref 7–25)
CO2: 23 mmol/L (ref 20–32)
Calcium: 9.6 mg/dL (ref 8.6–10.3)
Chloride: 100 mmol/L (ref 98–110)
Creat: 1.16 mg/dL — ABNORMAL HIGH (ref 0.70–1.11)
GFR, Est African American: 65 mL/min/{1.73_m2} (ref >=60)
GFR, Est Non African American: 56 mL/min/{1.73_m2} — ABNORMAL LOW (ref >=60)
Globulin: 2.1 g/dL (calc) (ref 1.9–3.7)
Glucose, Bld: 189 mg/dL — ABNORMAL HIGH (ref 65–99)
Potassium: 5.3 mmol/L (ref 3.5–5.3)
Sodium: 134 mmol/L — ABNORMAL LOW (ref 135–146)
Total Bilirubin: 0.5 mg/dL (ref 0.2–1.2)
Total Protein: 6.1 g/dL (ref 6.1–8.1)

## 2018-07-09 LAB — CBC WITH DIFFERENTIAL/PLATELET
Absolute Monocytes: 1470 cells/uL — ABNORMAL HIGH (ref 200–950)
Basophils Absolute: 85 cells/uL (ref 0–200)
Basophils Relative: 0.4 %
Eosinophils Absolute: 213 cells/uL (ref 15–500)
Eosinophils Relative: 1 %
HCT: 38.4 % — ABNORMAL LOW (ref 38.5–50.0)
Hemoglobin: 13 g/dL — ABNORMAL LOW (ref 13.2–17.1)
Lymphs Abs: 1363 cells/uL (ref 850–3900)
MCH: 30.2 pg (ref 27.0–33.0)
MCHC: 33.9 g/dL (ref 32.0–36.0)
MCV: 89.3 fL (ref 80.0–100.0)
MPV: 11.6 fL (ref 7.5–12.5)
Monocytes Relative: 6.9 %
Neutro Abs: 18169 cells/uL — ABNORMAL HIGH (ref 1500–7800)
Neutrophils Relative %: 85.3 %
Platelets: 339 10*3/uL (ref 140–400)
RBC: 4.3 10*6/uL (ref 4.20–5.80)
RDW: 12.2 % (ref 11.0–15.0)
Total Lymphocyte: 6.4 %
WBC: 21.3 10*3/uL — ABNORMAL HIGH (ref 3.8–10.8)

## 2018-07-10 ENCOUNTER — Emergency Department (HOSPITAL_COMMUNITY)
Admission: EM | Admit: 2018-07-10 | Discharge: 2018-07-10 | Disposition: A | Discharge status: Home / Self Care | Payer: Medicare Other | Source: Home / Self Care | Attending: Emergency Medicine | Admitting: Emergency Medicine

## 2018-07-10 ENCOUNTER — Telehealth: Payer: Self-pay | Admitting: Family

## 2018-07-10 ENCOUNTER — Encounter (HOSPITAL_COMMUNITY): Payer: Self-pay | Admitting: Emergency Medicine

## 2018-07-10 ENCOUNTER — Emergency Department (HOSPITAL_COMMUNITY): Payer: Medicare Other

## 2018-07-10 ENCOUNTER — Other Ambulatory Visit: Payer: Self-pay

## 2018-07-10 ENCOUNTER — Ambulatory Visit: Payer: Medicare Other | Admitting: Family

## 2018-07-10 ENCOUNTER — Inpatient Hospital Stay (HOSPITAL_COMMUNITY)
Admission: EM | Admit: 2018-07-10 | Discharge: 2018-07-12 | DRG: 872 | Disposition: A | Discharge status: Home Health | Payer: Medicare Other | Attending: Internal Medicine | Admitting: Internal Medicine

## 2018-07-10 DIAGNOSIS — R112 Nausea with vomiting, unspecified: Secondary | ICD-10-CM | POA: Diagnosis not present

## 2018-07-10 DIAGNOSIS — R6520 Severe sepsis without septic shock: Secondary | ICD-10-CM | POA: Diagnosis not present

## 2018-07-10 DIAGNOSIS — N319 Neuromuscular dysfunction of bladder, unspecified: Secondary | ICD-10-CM | POA: Diagnosis present

## 2018-07-10 DIAGNOSIS — I10 Essential (primary) hypertension: Secondary | ICD-10-CM | POA: Diagnosis present

## 2018-07-10 DIAGNOSIS — Z1159 Encounter for screening for other viral diseases: Secondary | ICD-10-CM

## 2018-07-10 DIAGNOSIS — R1032 Left lower quadrant pain: Secondary | ICD-10-CM | POA: Diagnosis not present

## 2018-07-10 DIAGNOSIS — R1084 Generalized abdominal pain: Secondary | ICD-10-CM | POA: Diagnosis not present

## 2018-07-10 DIAGNOSIS — K5732 Diverticulitis of large intestine without perforation or abscess without bleeding: Secondary | ICD-10-CM | POA: Diagnosis present

## 2018-07-10 DIAGNOSIS — K219 Gastro-esophageal reflux disease without esophagitis: Secondary | ICD-10-CM | POA: Diagnosis present

## 2018-07-10 DIAGNOSIS — I959 Hypotension, unspecified: Secondary | ICD-10-CM | POA: Diagnosis not present

## 2018-07-10 DIAGNOSIS — I471 Supraventricular tachycardia, unspecified: Secondary | ICD-10-CM | POA: Diagnosis present

## 2018-07-10 DIAGNOSIS — M5412 Radiculopathy, cervical region: Secondary | ICD-10-CM

## 2018-07-10 DIAGNOSIS — K5792 Diverticulitis of intestine, part unspecified, without perforation or abscess without bleeding: Secondary | ICD-10-CM

## 2018-07-10 DIAGNOSIS — Z9049 Acquired absence of other specified parts of digestive tract: Secondary | ICD-10-CM | POA: Diagnosis not present

## 2018-07-10 DIAGNOSIS — A419 Sepsis, unspecified organism: Secondary | ICD-10-CM | POA: Diagnosis not present

## 2018-07-10 DIAGNOSIS — Z79899 Other long term (current) drug therapy: Secondary | ICD-10-CM | POA: Insufficient documentation

## 2018-07-10 DIAGNOSIS — Z833 Family history of diabetes mellitus: Secondary | ICD-10-CM

## 2018-07-10 DIAGNOSIS — G2581 Restless legs syndrome: Secondary | ICD-10-CM | POA: Diagnosis present

## 2018-07-10 DIAGNOSIS — G5622 Lesion of ulnar nerve, left upper limb: Secondary | ICD-10-CM

## 2018-07-10 DIAGNOSIS — M545 Low back pain: Secondary | ICD-10-CM | POA: Diagnosis present

## 2018-07-10 DIAGNOSIS — E785 Hyperlipidemia, unspecified: Secondary | ICD-10-CM | POA: Diagnosis present

## 2018-07-10 DIAGNOSIS — N401 Enlarged prostate with lower urinary tract symptoms: Secondary | ICD-10-CM | POA: Diagnosis present

## 2018-07-10 DIAGNOSIS — G5602 Carpal tunnel syndrome, left upper limb: Secondary | ICD-10-CM | POA: Diagnosis present

## 2018-07-10 DIAGNOSIS — Z91040 Latex allergy status: Secondary | ICD-10-CM | POA: Insufficient documentation

## 2018-07-10 DIAGNOSIS — E1151 Type 2 diabetes mellitus with diabetic peripheral angiopathy without gangrene: Secondary | ICD-10-CM | POA: Diagnosis present

## 2018-07-10 DIAGNOSIS — R0902 Hypoxemia: Secondary | ICD-10-CM | POA: Diagnosis not present

## 2018-07-10 DIAGNOSIS — E872 Acidosis: Secondary | ICD-10-CM | POA: Diagnosis present

## 2018-07-10 DIAGNOSIS — Z8546 Personal history of malignant neoplasm of prostate: Secondary | ICD-10-CM

## 2018-07-10 DIAGNOSIS — Z794 Long term (current) use of insulin: Secondary | ICD-10-CM

## 2018-07-10 DIAGNOSIS — E119 Type 2 diabetes mellitus without complications: Secondary | ICD-10-CM | POA: Insufficient documentation

## 2018-07-10 DIAGNOSIS — D72829 Elevated white blood cell count, unspecified: Secondary | ICD-10-CM | POA: Diagnosis not present

## 2018-07-10 DIAGNOSIS — R509 Fever, unspecified: Secondary | ICD-10-CM | POA: Diagnosis not present

## 2018-07-10 DIAGNOSIS — Z20828 Contact with and (suspected) exposure to other viral communicable diseases: Secondary | ICD-10-CM | POA: Diagnosis not present

## 2018-07-10 LAB — CBC WITH DIFFERENTIAL/PLATELET
Abs Immature Granulocytes: 0.11 10*3/uL — ABNORMAL HIGH (ref 0.00–0.07)
Basophils Absolute: 0.1 10*3/uL (ref 0.0–0.1)
Basophils Relative: 1 %
Eosinophils Absolute: 0.2 10*3/uL (ref 0.0–0.5)
Eosinophils Relative: 2 %
HCT: 37.1 % — ABNORMAL LOW (ref 39.0–52.0)
Hemoglobin: 12.2 g/dL — ABNORMAL LOW (ref 13.0–17.0)
Immature Granulocytes: 1 %
Lymphocytes Relative: 13 %
Lymphs Abs: 1.2 10*3/uL (ref 0.7–4.0)
MCH: 30.4 pg (ref 26.0–34.0)
MCHC: 32.9 g/dL (ref 30.0–36.0)
MCV: 92.5 fL (ref 80.0–100.0)
Monocytes Absolute: 0.9 10*3/uL (ref 0.1–1.0)
Monocytes Relative: 10 %
Neutro Abs: 7 10*3/uL (ref 1.7–7.7)
Neutrophils Relative %: 73 %
Platelets: 295 10*3/uL (ref 150–400)
RBC: 4.01 MIL/uL — ABNORMAL LOW (ref 4.22–5.81)
RDW: 12.2 % (ref 11.5–15.5)
WBC: 9.5 10*3/uL (ref 4.0–10.5)
nRBC: 0 % (ref 0.0–0.2)

## 2018-07-10 LAB — LACTIC ACID, PLASMA
Lactic Acid, Venous: 3.5 mmol/L (ref 0.5–1.9)
Lactic Acid, Venous: 2.4 mmol/L (ref 0.5–1.9)

## 2018-07-10 LAB — URINALYSIS, ROUTINE W REFLEX MICROSCOPIC
Bilirubin Urine: NEGATIVE
Glucose, UA: NEGATIVE mg/dL
Hgb urine dipstick: NEGATIVE
Ketones, ur: NEGATIVE mg/dL
Leukocytes,Ua: NEGATIVE
Nitrite: NEGATIVE
Protein, ur: NEGATIVE mg/dL
Specific Gravity, Urine: 1.004 — ABNORMAL LOW (ref 1.005–1.030)
pH: 6 (ref 5.0–8.0)

## 2018-07-10 LAB — COMPREHENSIVE METABOLIC PANEL
ALT: 23 U/L (ref 0–44)
AST: 22 U/L (ref 15–41)
Albumin: 3.3 g/dL — ABNORMAL LOW (ref 3.5–5.0)
Alkaline Phosphatase: 75 U/L (ref 38–126)
Anion gap: 9 (ref 5–15)
BUN: 14 mg/dL (ref 8–23)
CO2: 22 mmol/L (ref 22–32)
Calcium: 8.9 mg/dL (ref 8.9–10.3)
Chloride: 103 mmol/L (ref 98–111)
Creatinine, Ser: 0.94 mg/dL (ref 0.61–1.24)
GFR calc Af Amer: >60 mL/min (ref >60)
GFR calc non Af Amer: >60 mL/min (ref >60)
Glucose, Bld: 188 mg/dL — ABNORMAL HIGH (ref 70–99)
Potassium: 3.9 mmol/L (ref 3.5–5.1)
Sodium: 134 mmol/L — ABNORMAL LOW (ref 135–145)
Total Bilirubin: 0.7 mg/dL (ref 0.3–1.2)
Total Protein: 6.2 g/dL — ABNORMAL LOW (ref 6.5–8.1)

## 2018-07-10 LAB — CREATININE, SERUM
Creatinine, Ser: 1.35 mg/dL — ABNORMAL HIGH (ref 0.61–1.24)
GFR calc Af Amer: 54 mL/min — ABNORMAL LOW (ref >60)
GFR calc non Af Amer: 47 mL/min — ABNORMAL LOW (ref >60)

## 2018-07-10 LAB — POC OCCULT BLOOD, ED: Fecal Occult Bld: POSITIVE — AB

## 2018-07-10 LAB — CBC
HCT: 34 % — ABNORMAL LOW (ref 39.0–52.0)
Hemoglobin: 11.4 g/dL — ABNORMAL LOW (ref 13.0–17.0)
MCH: 30.4 pg (ref 26.0–34.0)
MCHC: 33.5 g/dL (ref 30.0–36.0)
MCV: 90.7 fL (ref 80.0–100.0)
Platelets: 267 10*3/uL (ref 150–400)
RBC: 3.75 MIL/uL — ABNORMAL LOW (ref 4.22–5.81)
RDW: 12.3 % (ref 11.5–15.5)
WBC: 19.8 10*3/uL — ABNORMAL HIGH (ref 4.0–10.5)
nRBC: 0 % (ref 0.0–0.2)

## 2018-07-10 LAB — GLUCOSE, CAPILLARY: Glucose-Capillary: 80 mg/dL (ref 70–99)

## 2018-07-10 LAB — CBG MONITORING, ED: Glucose-Capillary: 87 mg/dL (ref 70–99)

## 2018-07-10 MED ORDER — GABAPENTIN 300 MG PO CAPS
300.0000 mg | ORAL_CAPSULE | Freq: Two times a day (BID) | ORAL | Status: DC
  Administered 2018-07-10 – 2018-07-11 (×2): 300 mg via ORAL
  Filled 2018-07-10 (×2): qty 1

## 2018-07-10 MED ORDER — PROBIOTIC-10 PO CHEW: 1.0000 | CHEWABLE_TABLET | Freq: Every day | ORAL | Status: DC

## 2018-07-10 MED ORDER — LACTATED RINGERS IV BOLUS
500.0000 mL | Freq: Once | INTRAVENOUS | Status: AC
  Administered 2018-07-10: 500 mL via INTRAVENOUS

## 2018-07-10 MED ORDER — METRONIDAZOLE 500 MG PO TABS
500.0000 mg | ORAL_TABLET | Freq: Once | ORAL | Status: AC
  Administered 2018-07-10: 500 mg via ORAL
  Filled 2018-07-10: qty 1

## 2018-07-10 MED ORDER — LORATADINE 10 MG PO TABS
10.0000 mg | ORAL_TABLET | Freq: Every day | ORAL | Status: DC
  Administered 2018-07-11 – 2018-07-12 (×2): 10 mg via ORAL
  Filled 2018-07-10 (×2): qty 1

## 2018-07-10 MED ORDER — CIPROFLOXACIN HCL 500 MG PO TABS
500.0000 mg | ORAL_TABLET | Freq: Once | ORAL | Status: AC
  Administered 2018-07-10: 500 mg via ORAL
  Filled 2018-07-10: qty 1

## 2018-07-10 MED ORDER — METOPROLOL TARTRATE 50 MG PO TABS
50.0000 mg | ORAL_TABLET | Freq: Two times a day (BID) | ORAL | Status: DC
  Administered 2018-07-11: 50 mg via ORAL
  Filled 2018-07-10 (×2): qty 1

## 2018-07-10 MED ORDER — METRONIDAZOLE 500 MG PO TABS: 500.0000 mg | ORAL_TABLET | Freq: Two times a day (BID) | ORAL | 0 refills | Status: DC

## 2018-07-10 MED ORDER — ONDANSETRON HCL 4 MG/2ML IJ SOLN: 4.0000 mg | Freq: Four times a day (QID) | INTRAMUSCULAR | Status: DC | PRN

## 2018-07-10 MED ORDER — INSULIN GLARGINE 100 UNIT/ML ~~LOC~~ SOLN
45.0000 [IU] | Freq: Every day | SUBCUTANEOUS | Status: DC
  Administered 2018-07-11 – 2018-07-12 (×2): 45 [IU] via SUBCUTANEOUS
  Filled 2018-07-10 (×2): qty 0.45

## 2018-07-10 MED ORDER — ACETAMINOPHEN 500 MG PO TABS
1000.0000 mg | ORAL_TABLET | Freq: Three times a day (TID) | ORAL | Status: DC
  Administered 2018-07-10 – 2018-07-12 (×5): 1000 mg via ORAL
  Filled 2018-07-10 (×5): qty 2

## 2018-07-10 MED ORDER — HEPARIN SODIUM (PORCINE) 5000 UNIT/ML IJ SOLN
5000.0000 [IU] | Freq: Three times a day (TID) | INTRAMUSCULAR | Status: DC
  Administered 2018-07-10 – 2018-07-12 (×5): 5000 [IU] via SUBCUTANEOUS
  Filled 2018-07-10 (×5): qty 1

## 2018-07-10 MED ORDER — CIPROFLOXACIN IN D5W 400 MG/200ML IV SOLN
400.0000 mg | Freq: Two times a day (BID) | INTRAVENOUS | Status: DC
  Administered 2018-07-11 – 2018-07-12 (×3): 400 mg via INTRAVENOUS
  Filled 2018-07-10 (×3): qty 200

## 2018-07-10 MED ORDER — RISAQUAD PO CAPS
1.0000 | ORAL_CAPSULE | Freq: Every day | ORAL | Status: DC
  Administered 2018-07-11 – 2018-07-12 (×2): 1 via ORAL
  Filled 2018-07-10 (×2): qty 1

## 2018-07-10 MED ORDER — TROLAMINE SALICYLATE 10 % EX CREA
1.0000 "application " | TOPICAL_CREAM | CUTANEOUS | Status: DC | PRN
  Filled 2018-07-10: qty 85

## 2018-07-10 MED ORDER — METRONIDAZOLE IN NACL 5-0.79 MG/ML-% IV SOLN
500.0000 mg | Freq: Three times a day (TID) | INTRAVENOUS | Status: DC
  Administered 2018-07-10 – 2018-07-12 (×5): 500 mg via INTRAVENOUS
  Filled 2018-07-10 (×5): qty 100

## 2018-07-10 MED ORDER — ACETAMINOPHEN 500 MG PO TABS
1000.0000 mg | ORAL_TABLET | Freq: Once | ORAL | Status: AC
  Administered 2018-07-10: 1000 mg via ORAL
  Filled 2018-07-10: qty 2

## 2018-07-10 MED ORDER — CIPROFLOXACIN HCL 500 MG PO TABS: 500.0000 mg | ORAL_TABLET | Freq: Two times a day (BID) | ORAL | 0 refills | Status: DC

## 2018-07-10 MED ORDER — LOPERAMIDE HCL 1 MG/7.5ML PO SUSP
1.0000 mg | ORAL | Status: DC | PRN
  Filled 2018-07-10: qty 7.5

## 2018-07-10 MED ORDER — INSULIN ASPART 100 UNIT/ML ~~LOC~~ SOLN: 0.0000 [IU] | Freq: Every day | SUBCUTANEOUS | Status: DC

## 2018-07-10 MED ORDER — FAMOTIDINE 10 MG PO CHEW: 10.0000 mg | CHEWABLE_TABLET | Freq: Every day | ORAL | Status: DC | PRN

## 2018-07-10 MED ORDER — MORPHINE SULFATE (PF) 2 MG/ML IV SOLN
2.0000 mg | INTRAVENOUS | Status: DC | PRN
  Administered 2018-07-11: 2 mg via INTRAVENOUS
  Filled 2018-07-10: qty 1

## 2018-07-10 MED ORDER — SODIUM CHLORIDE 0.9 % IV BOLUS
1000.0000 mL | Freq: Once | INTRAVENOUS | Status: AC
  Administered 2018-07-10: 1000 mL via INTRAVENOUS

## 2018-07-10 MED ORDER — CHOLECALCIFEROL 10 MCG (400 UNIT) PO TABS
400.0000 [IU] | ORAL_TABLET | Freq: Every day | ORAL | Status: DC
  Administered 2018-07-11 – 2018-07-12 (×2): 400 [IU] via ORAL
  Filled 2018-07-10 (×2): qty 1

## 2018-07-10 MED ORDER — IOPAMIDOL (ISOVUE-300) INJECTION 61%
100.0000 mL | Freq: Once | INTRAVENOUS | Status: AC | PRN
  Administered 2018-07-10: 100 mL via INTRAVENOUS

## 2018-07-10 MED ORDER — LACTATED RINGERS IV SOLN
INTRAVENOUS | Status: DC
  Administered 2018-07-10: 23:00:00 via INTRAVENOUS
  Administered 2018-07-11: 1000 mL via INTRAVENOUS

## 2018-07-10 MED ORDER — INSULIN GLARGINE 100 UNIT/ML SOLOSTAR PEN: 45.0000 [IU] | PEN_INJECTOR | SUBCUTANEOUS | Status: DC

## 2018-07-10 MED ORDER — FAMOTIDINE 20 MG PO TABS: 10.0000 mg | ORAL_TABLET | Freq: Every day | ORAL | Status: DC | PRN

## 2018-07-10 MED ORDER — INSULIN ASPART 100 UNIT/ML ~~LOC~~ SOLN
0.0000 [IU] | Freq: Three times a day (TID) | SUBCUTANEOUS | Status: DC
  Administered 2018-07-11 (×2): 1 [IU] via SUBCUTANEOUS

## 2018-07-10 MED ORDER — ALUM & MAG HYDROXIDE-SIMETH 200-200-20 MG/5ML PO SUSP: 30.0000 mL | ORAL | Status: DC | PRN

## 2018-07-10 MED ORDER — LOPERAMIDE HCL 1 MG/5ML PO LIQD: 1.0000 mg | ORAL | Status: DC | PRN

## 2018-07-10 MED ORDER — TAMSULOSIN HCL 0.4 MG PO CAPS
0.4000 mg | ORAL_CAPSULE | Freq: Every evening | ORAL | Status: DC
  Administered 2018-07-10 – 2018-07-11 (×2): 0.4 mg via ORAL
  Filled 2018-07-10 (×2): qty 1

## 2018-07-10 MED ORDER — ONDANSETRON HCL 4 MG PO TABS: 4.0000 mg | ORAL_TABLET | Freq: Four times a day (QID) | ORAL | Status: DC | PRN

## 2018-07-10 NOTE — H&P (Signed)
History and Physical   Micheal [name] BWI:[id number] DOB: 07/06/1928 DOA: 07/10/2018  Referring MD/NP/PA: Dr. Venora Maples  PCP: Gayland Curry, DO    Outpatient Specialists: None  Patient coming from: Home  Chief Complaint: Abdominal pain and fever  HPI: Micheal [name] is a 85 y.o. male with medical history significant of diabetes, GERD, cervical spondylosis, hypertension, irritable bowel syndrome neurogenic bladder, history of malignant prostate cancer, previous diverticulitis, paroxysmal supraventricular tachycardia who presented to the ER initially with fever and abdominal pain.  He was recently admitted and treated for lower extremity cellulitis and placed on Keflex which he completed.  He came back with left lower quadrant abdominal pain and fever.  Primary care physician evaluated the patient.  He was diagnosed with acute diverticulitis and discharged home on antibiotics.  Patient went home and could not even get out of bed.  His fever came back.  He was feeling bad and came back to the ER where he was found to be septic with increased lactic acid as well as more pain.  He is not been admitted to the hospital for treatment.  ED Course: Temperature 102.8 blood pressure 72/43 pulse 104 respiratory 25 oxygen sat 90% room air.  White count 9.5 hemoglobin 12.2 and platelet 295. Sodium 134 potassium 3.9 chloride 103.  Creatinine 0.94 and glucose 188..  Lactic acid 3.5.  Chest x-ray showed no acute findings.  CT abdomen pelvis showed mild uncomplicated diverticulitis of the proximal sigmoid colon.  Patient is being admitted with sepsis based on the features also hypotension.  Review of Systems: As per HPI otherwise 10 point review of systems negative.    Past Medical History:  Diagnosis Date  . Allergic rhinitis, cause unspecified   . Carpal tunnel syndrome   . Cervical spondylosis without myelopathy   . Cervical spondylosis without myelopathy   . Cervicalgia   . Diverticulosis of colon  (without mention of hemorrhage)   . Esophageal reflux   . Hypertrophy of prostate with urinary obstruction and other lower urinary tract symptoms (LUTS)   . Insomnia, unspecified   . Internal hemorrhoids without mention of complication   . Irritable bowel syndrome   . Macular degeneration (senile) of retina, unspecified   . Malignant neoplasm of prostate ([place])   . Neurogenic bladder, NOS   . Neuropathy   . Nonspecific (abnormal) findings on radiological and other examination of gastrointestinal tract   . Orthostatic hypotension   . Osteoarthrosis, unspecified whether generalized or localized, unspecified site   . Other and unspecified hyperlipidemia   . Other malaise and fatigue   . Other specified disorder of rectum and anus    Radiation Proctitus  . Paroxysmal supraventricular tachycardia ([place])   . Restless legs syndrome (RLS)   . Retinal detachment with retinal defect, unspecified   . Right bundle branch block    Incomplete  . Spermatocele    Right  . Trigger finger (acquired)   . Type II or unspecified type diabetes mellitus without mention of complication, not stated as uncontrolled   . Type II or unspecified type diabetes mellitus without mention of complication, uncontrolled   . Unspecified essential hypertension    patient denies, states he has a "fast heart rate, but no high BP"  . Vitamin D deficiency     Past Surgical History:  Procedure Laterality Date  . Angiolipoma  1980   Right arm  . APPENDECTOMY  1951  . CHOLECYSTECTOMY  01/1994  . COLONOSCOPY  04/11/2010  .  COLONOSCOPY W/ POLYPECTOMY  2005   Removed 2 (two) polyps  . DUPUYTREN CONTRACTURE RELEASE Bilateral 1989  . EYE SURGERY  2006   Left  . EYE SURGERY  2006   Retina reattachment, right  . EYE SURGERY  02/1994   Cataract surgery, right  . EYE SURGERY  01/1995   Cataract surgery, left  . KNEE SURGERY  1979  . PARATHYROIDECTOMY Left 12/27/2016   Procedure: LEFT INFERIOR PARATHYROIDECTOMY;  Surgeon:  Armandina Gemma, MD;  Location: [facility];  Service: General;  Laterality: Left;  . POLYPECTOMY  1955   Vocal cord  . RETINAL DETACHMENT SURGERY Right 2005  . SHOULDER ARTHROSCOPY Right 03/22/15   [place]  . TENDON RELEASE  01/1997   seven fingers  . TRANSURETHRAL RESECTION OF PROSTATE  08/1989  . VITRECTOMY Left 04/23/2013   [facility]     reports that he has never smoked. He has never used smokeless tobacco. He reports that he does not drink alcohol or use drugs.  Allergies  Allergen Reactions  . Aspirin Other (See Comments)    Bothers stomach, thins blood   . Atorvastatin Other (See Comments)    Muscular pain and weakness  . Prednisone Other (See Comments)    Upset stomach, can take by shot not by mouth  . Simvastatin Other (See Comments)    Muscular pain and weakness  . Sulfa Antibiotics Other (See Comments)    Upset stomach   . Latex Rash  . Metformin Hcl Nausea Only  . Voltaren [Diclofenac Sodium] Other (See Comments)    Unknown    Family History  Problem Relation Age of Onset  . Depression Mother   . Diabetes Mother   . Mental illness Mother        OCD  . Cerebral aneurysm Father        age 76  . Hypertension Sister   . Diabetes Daughter   . Hyperlipidemia Daughter   . Hyperparathyroidism Neg Hx      Prior to Admission medications   Medication Sig Start Date End Date Taking? Authorizing Provider  acetaminophen (TYLENOL) 500 MG tablet Take 1,000 mg by mouth 3 (three) times daily.    [provider]  alum & mag hydroxide-simeth (MAALOX/MYLANTA) 200-200-20 MG/5ML suspension Take 30 mLs by mouth as needed for indigestion or heartburn.    [provider]  cephALEXin (KEFLEX) 500 MG capsule Take 500 mg by mouth 4 (four) times daily. 07/02/18   [provider]  cholecalciferol (VITAMIN D) 400 units TABS tablet Take 400 Units by mouth daily.    [provider]  ciprofloxacin (CIPRO) 500 MG tablet Take 1 tablet (500 mg total) by mouth 2  (two) times daily. 07/10/18   Jola Schmidt, MD  docusate sodium (COLACE) 100 MG capsule Take 100 mg by mouth daily as needed.     [provider]  famotidine (PEPCID AC) 10 MG chewable tablet Chew 10 mg by mouth daily as needed for heartburn.    [provider]  gabapentin (NEURONTIN) 300 MG capsule Take 1 capsule (300 mg total) by mouth 2 (two) times daily. 06/23/18   Reed, Tiffany L, DO  Insulin Glargine (LANTUS SOLOSTAR) 100 UNIT/ML Solostar Pen INJECT 45 UNITS  SUBCUTANEOUSLY IN THE  MORNING Patient taking differently: Inject 45 Units into the skin every morning.  11/15/17   Reed, Tiffany L, DO  Insulin Pen Needle (B-D ULTRAFINE III SHORT PEN) 31G X 8 MM MISC Use twice daily for insulin 02/03/18  Reed, Tiffany L, DO  loperamide (IMODIUM) 1 MG/5ML solution Take 1 mg by mouth as needed for diarrhea or loose stools.    [provider]  loratadine (CLARITIN) 10 MG tablet Take 10 mg by mouth daily.    [provider]  metoprolol tartrate (LOPRESSOR) 50 MG tablet TAKE 1 TABLET BY MOUTH TWO  TIMES DAILY Patient taking differently: Take 50 mg by mouth 2 (two) times daily.  04/21/18   Reed, Tiffany L, DO  metroNIDAZOLE (FLAGYL) 500 MG tablet Take 1 tablet (500 mg total) by mouth 2 (two) times daily. 07/10/18   Jola Schmidt, MD  Probiotic Product (PROBIOTIC-10) CHEW Chew 1 each by mouth daily.     [provider]  tamsulosin (FLOMAX) 0.4 MG CAPS capsule TAKE 1 CAPSULE BY MOUTH  DAILY FOR PROSTATE Patient taking differently: Take 0.4 mg by mouth every evening.  06/02/18   Reed, Tiffany L, DO  trolamine salicylate (ASPERCREME) 10 % cream Apply 1 application topically as needed for muscle pain.    [provider]    Physical Exam: Vitals:   07/10/18 1936 07/10/18 1945 07/10/18 2000 07/10/18 2100  BP: (!) 93/46 (!) 102/45 (!) 98/47 (!) 91/53  Pulse: 100 98 (!) 103 97  Resp:  (!) 25 20   Temp: (!) 102.7 F (39.3 C)   98 F (36.7 C)  TempSrc: Oral    Oral  SpO2: 100% 100% 100% 100%      Constitutional: NAD, calm, weak, acutely ill looking, frail Vitals:   07/10/18 1936 07/10/18 1945 07/10/18 2000 07/10/18 2100  BP: (!) 93/46 (!) 102/45 (!) 98/47 (!) 91/53  Pulse: 100 98 (!) 103 97  Resp:  (!) 25 20   Temp: (!) 102.7 F (39.3 C)   98 F (36.7 C)  TempSrc: Oral   Oral  SpO2: 100% 100% 100% 100%   Eyes: PERRL, lids and conjunctivae normal ENMT: Mucous membranes are moist. Posterior pharynx clear of any exudate or lesions.Normal dentition.  Neck: normal, supple, no masses, no thyromegaly Respiratory: clear to auscultation bilaterally, no wheezing, no crackles. Normal respiratory effort. No accessory muscle use.  Cardiovascular: Sinus tachycardia, no murmurs / rubs / gallops. No extremity edema. 2+ pedal pulses. No carotid bruits.  Abdomen: Diffuse abdominal tenderness, no masses palpated. No hepatosplenomegaly. Bowel sounds positive.  Musculoskeletal: no clubbing / cyanosis. No joint deformity upper and lower extremities. Good ROM, no contractures. Normal muscle tone.  Skin: no rashes, lesions, ulcers. No induration Neurologic: CN 2-12 grossly intact. Sensation intact, DTR normal. Strength 5/5 in all 4.  Psychiatric: Normal judgment and insight. Alert and oriented x 3. Normal mood.     Labs on Admission: I have personally reviewed following labs and imaging studies  CBC: Recent Labs  Lab 07/08/18 1625 07/10/18 1130  WBC 21.3* 9.5  NEUTROABS 18,169* 7.0  HGB 13.0* 12.2*  HCT 38.4* 37.1*  MCV 89.3 92.5  PLT 339 812   Basic Metabolic Panel: Recent Labs  Lab 07/08/18 1625 07/10/18 1130  NA 134* 134*  K 5.3 3.9  CL 100 103  CO2 23 22  GLUCOSE 189* 188*  BUN 19 14  CREATININE 1.16* 0.94  CALCIUM 9.6 8.9   GFR: Estimated Creatinine Clearance: 49 mL/min (by C-G formula based on SCr of 0.94 mg/dL). Liver Function Tests: Recent Labs  Lab 07/08/18 1625 07/10/18 1130  AST 14 22  ALT 25 23  ALKPHOS  --  75   BILITOT 0.5 0.7  PROT 6.1 6.2*  ALBUMIN  --  3.3*   No results for input(s): LIPASE, AMYLASE in the last 168 hours. No results for input(s): AMMONIA in the last 168 hours. Coagulation Profile: No results for input(s): INR, PROTIME in the last 168 hours. Cardiac Enzymes: No results for input(s): CKTOTAL, CKMB, CKMBINDEX, TROPONINI in the last 168 hours. BNP (last 3 results) No results for input(s): PROBNP in the last 8760 hours. HbA1C: No results for input(s): HGBA1C in the last 72 hours. CBG: Recent Labs  Lab 07/10/18 2048  GLUCAP 87   Lipid Profile: No results for input(s): CHOL, HDL, LDLCALC, TRIG, CHOLHDL, LDLDIRECT in the last 72 hours. Thyroid Function Tests: No results for input(s): TSH, T4TOTAL, FREET4, T3FREE, THYROIDAB in the last 72 hours. Anemia Panel: No results for input(s): VITAMINB12, FOLATE, FERRITIN, TIBC, IRON, RETICCTPCT in the last 72 hours. Urine analysis:    Component Value Date/Time   COLORURINE STRAW (A) 07/10/2018 1340   APPEARANCEUR CLEAR 07/10/2018 1340   LABSPEC 1.004 (L) 07/10/2018 1340   PHURINE 6.0 07/10/2018 1340   GLUCOSEU NEGATIVE 07/10/2018 1340   HGBUR NEGATIVE 07/10/2018 1340   BILIRUBINUR NEGATIVE 07/10/2018 1340   KETONESUR NEGATIVE 07/10/2018 1340   PROTEINUR NEGATIVE 07/10/2018 1340   UROBILINOGEN 0.2 11/06/2013 1621   NITRITE NEGATIVE 07/10/2018 1340   LEUKOCYTESUR NEGATIVE 07/10/2018 1340   Sepsis Labs: @LABRCNTIP (procalcitonin:4,lacticidven:4) )No results found for this or any previous visit (from the past 240 hour(s)).   Radiological Exams on Admission: Ct Abdomen Pelvis W Contrast  Result Date: 07/10/2018 CLINICAL DATA:  Left lower quadrant abdominal pain. Recent infection. On antibiotics. EXAM: CT ABDOMEN AND PELVIS WITH CONTRAST TECHNIQUE: Multidetector CT imaging of the abdomen and pelvis was performed using the standard protocol following bolus administration of intravenous contrast. CONTRAST:  169mL ISOVUE-300  IOPAMIDOL (ISOVUE-300) INJECTION 61% COMPARISON:  CT, 11/06/2013. FINDINGS: Lower chest: 6 mm nodule, subpleural right lower lobe, image 3, series 4. No acute findings. Hepatobiliary: No focal liver abnormality is seen. Status post cholecystectomy. No biliary dilatation. Pancreas: 13 mm low-density lesion in the pancreatic body. 7 mm cystic lesion along the anterior superior pancreatic head. No other pancreatic masses or lesions. No inflammation. Spleen: Normal in size without focal abnormality. Adrenals/Urinary Tract: No adrenal masses. Kidneys normal size, orientation and position. Symmetric renal enhancement and excretion. 0.4 cm cyst, posterior mid to upper pole the left kidney. No other masses, no stones and no hydronephrosis. Normal ureters. Normal bladder. Stomach/Bowel: Mild hazy opacities noted in the fat adjacent to the proximal sigmoid colon, with there are multiple diverticula, consistent mild, uncomplicated diverticulitis. No other colonic inflammation. Stomach is unremarkable. Small bowel is normal in caliber with no wall thickening or inflammation. Vascular/Lymphatic: No enlarged lymph nodes. Aortic atherosclerosis. Reproductive: Unremarkable. Other: No abdominal wall hernia or abnormality. No abdominopelvic ascites. Musculoskeletal: No fracture or acute finding. No osteoblastic or osteolytic lesions. IMPRESSION: 1. Mild, uncomplicated diverticulitis of the proximal sigmoid colon. No extraluminal air. No abscess. 2. No other acute abnormality within the abdomen or pelvis. 3. 6 mm nodule, right lower lobe, not evident on previous CT scans. 4. Small low-attenuation, cystic appearing, lesions in the pancreas. The larger of these, in the pancreatic body, was present on the CT dated 02/03/2010, and is unchanged. The smaller lesion along the anterior superior pancreatic head appears new. Based on consensus guidelines, recommend follow-up CT in 2 years to reassess. Electronically Signed   By: Lajean Manes  M.D.   On: 07/10/2018 13:58   Dg Chest Portable 1 View  Result Date: 07/10/2018 CLINICAL DATA:  Fever and elevated white blood cell count. History of cellulitis. EXAM: PORTABLE CHEST 1 VIEW COMPARISON:  06/29/2010 FINDINGS: The cardiac silhouette, mediastinal and hilar contours are within normal limits and stable. Mild tortuosity and calcification of the thoracic aorta. The lungs are clear. No pleural effusion. No worrisome pulmonary lesions. The bony thorax is intact. IMPRESSION: No acute cardiopulmonary findings. Electronically Signed   By: Marijo Sanes M.D.   On: 07/10/2018 13:08    EKG: Independently reviewed. It shows sinus tachycardia with a rate of 105, right bundle branch block, wide complex tachycardia  Assessment/Plan Principal Problem:   Sepsis ([place]) Active Problems:   Hyperlipidemia   Essential hypertension   Paroxysmal supraventricular tachycardia ([facility])   DM (diabetes mellitus), type 2 with peripheral vascular complications ([place])   H/O prostate cancer   Acute diverticulitis     #1 sepsis with hypotension: Patient will be admitted to progressive care unit.  Aggressive fluid resuscitation.  IV Cipro and Flagyl.  Follow blood cultures.  Patient high risk for C. difficile colitis.  We will check him for C. difficile.  If positive will adjust antibiotics accordingly to oral vancomycin.  #2 hyperlipidemia: Continue statin as tolerated.  #3 acute diverticulitis: May be because of patient's sepsis.  Recently had cellulitis but seems to be improved.  #4 diabetes: Continue home Lantus with sliding scale insulin  #5 essential hypertension: Patient hypotensive now continue treatment.  Hold blood pressure medications from home until it improves.  #6 history of prostate cancer: Stable.  Continue outpatient treatment.  DVT prophylaxis: Lovenox Code Status: Full code Family Communication: Discussed care with the patient. Disposition Plan: To be determined Consults called: None  Admission status: Inpatient to progressive care  Severity of Illness: The appropriate patient status for this patient is INPATIENT. Inpatient status is judged to be reasonable and necessary in order to provide the required intensity of service to ensure the patient's safety. The patient's presenting symptoms, physical exam findings, and initial radiographic and laboratory data in the context of their chronic comorbidities is felt to place them at high risk for further clinical deterioration. Furthermore, it is not anticipated that the patient will be medically stable for discharge from the hospital within 2 midnights of admission. The following factors support the patient status of inpatient.   " The patient's presenting symptoms include fever with abdominal pain. " The worrisome physical exam findings include tender abdomen and fever. " The initial radiographic and laboratory data are worrisome because of lactic acidosis with hypotension. " The chronic co-morbidities include diabetes with diverticulosis.   * I certify that at the point of admission it is my clinical judgment that the patient will require inpatient hospital care spanning beyond 2 midnights from the point of admission due to high intensity of service, high risk for further deterioration and high frequency of surveillance required.Barbette Merino MD [facility] Pager [phone]  If 7PM-7AM, please contact night-coverage [email or web address] Password [facility]  07/10/2018, 9:08 PM

## 2018-07-10 NOTE — ED Notes (Signed)
Got patient on the monitor patient is resting with call bell in reach  ?

## 2018-07-10 NOTE — Sepsis Progress Note (Signed)
Notified bedside nurse of need to administer fluid bolus. Spoke with bedside RN regarding fluid resusitation. So far including EMS the patient has received 2500 cc's of fluid. It is documented in ED timeline but not on Sepsis Worksheet.

## 2018-07-10 NOTE — ED Notes (Signed)
ED TO INPATIENT HANDOFF REPORT  ED Nurse Name and Phone #: William Hamburger, RN [phone]  S Name/Age/Gender Micheal [name] 85 y.o. male Room/Bed: 017C/017C  Code Status   Code Status: Prior  Home/SNF/Other Home Patient oriented to: situation Is this baseline? No   Triage Complete: Triage complete  Chief Complaint diverticulitis  Triage Note Came in via EMS; c/o diverticulitis along w/ low BP and EMS reported mid 80s Spo2 on arival.    Allergies Allergies  Allergen Reactions  . Aspirin Other (See Comments)    Bothers stomach, thins blood   . Atorvastatin Other (See Comments)    Muscular pain and weakness  . Prednisone Other (See Comments)    Upset stomach, can take by shot not by mouth  . Simvastatin Other (See Comments)    Muscular pain and weakness  . Sulfa Antibiotics Other (See Comments)    Upset stomach   . Latex Rash  . Metformin Hcl Nausea Only  . Voltaren [Diclofenac Sodium] Other (See Comments)    Unknown    Level of Care/Admitting Diagnosis ED Disposition    None      B Medical/Surgery History Past Medical History:  Diagnosis Date  . Allergic rhinitis, cause unspecified   . Carpal tunnel syndrome   . Cervical spondylosis without myelopathy   . Cervical spondylosis without myelopathy   . Cervicalgia   . Diverticulosis of colon (without mention of hemorrhage)   . Esophageal reflux   . Hypertrophy of prostate with urinary obstruction and other lower urinary tract symptoms (LUTS)   . Insomnia, unspecified   . Internal hemorrhoids without mention of complication   . Irritable bowel syndrome   . Macular degeneration (senile) of retina, unspecified   . Malignant neoplasm of prostate ([place])   . Neurogenic bladder, NOS   . Neuropathy   . Nonspecific (abnormal) findings on radiological and other examination of gastrointestinal tract   . Orthostatic hypotension   . Osteoarthrosis, unspecified whether generalized or localized, unspecified site   . Other and  unspecified hyperlipidemia   . Other malaise and fatigue   . Other specified disorder of rectum and anus    Radiation Proctitus  . Paroxysmal supraventricular tachycardia ([place])   . Restless legs syndrome (RLS)   . Retinal detachment with retinal defect, unspecified   . Right bundle branch block    Incomplete  . Spermatocele    Right  . Trigger finger (acquired)   . Type II or unspecified type diabetes mellitus without mention of complication, not stated as uncontrolled   . Type II or unspecified type diabetes mellitus without mention of complication, uncontrolled   . Unspecified essential hypertension    patient denies, states he has a "fast heart rate, but no high BP"  . Vitamin D deficiency    Past Surgical History:  Procedure Laterality Date  . Angiolipoma  1980   Right arm  . APPENDECTOMY  1951  . CHOLECYSTECTOMY  01/1994  . COLONOSCOPY  04/11/2010  . COLONOSCOPY W/ POLYPECTOMY  2005   Removed 2 (two) polyps  . DUPUYTREN CONTRACTURE RELEASE Bilateral 1989  . EYE SURGERY  2006   Left  . EYE SURGERY  2006   Retina reattachment, right  . EYE SURGERY  02/1994   Cataract surgery, right  . EYE SURGERY  01/1995   Cataract surgery, left  . KNEE SURGERY  1979  . PARATHYROIDECTOMY Left 12/27/2016   Procedure: LEFT INFERIOR PARATHYROIDECTOMY;  Surgeon: Armandina Gemma, MD;  Location: [facility];  Service: General;  Laterality: Left;  . POLYPECTOMY  1955   Vocal cord  . RETINAL DETACHMENT SURGERY Right 2005  . SHOULDER ARTHROSCOPY Right 03/22/15   [place]  . TENDON RELEASE  01/1997   seven fingers  . TRANSURETHRAL RESECTION OF PROSTATE  08/1989  . VITRECTOMY Left 04/23/2013   [facility]     A IV Location/Drains/Wounds Patient Lines/Drains/Airways Status   Active Line/Drains/Airways    Name:   Placement date:   Placement time:   Site:   Days:   Peripheral IV 07/10/18 Left Arm   07/10/18    1830    Arm   less than 1   Peripheral IV 07/10/18 Right Arm   07/10/18    1800    Arm    less than 1   Incision (Closed) 12/27/16 Neck Other (Comment)   12/27/16    0920     560          Intake/Output Last 24 hours  Intake/Output Summary (Last 24 hours) at 07/10/2018 1910 Last data filed at 07/10/2018 1801 Gross per 24 hour  Intake 500 ml  Output 0 ml  Net 500 ml    Labs/Imaging Results for orders placed or performed during the hospital encounter of 07/10/18 (from the past 48 hour(s))  Lactic acid, plasma     Status: Abnormal   Collection Time: 07/10/18  6:27 PM  Result Value Ref Range   Lactic Acid, Venous 3.5 (HH) 0.5 - 1.9 mmol/L    Comment: CRITICAL RESULT CALLED TO, READ BACK BY AND VERIFIED WITH: L Tacey Dimaggio RN AT 1903 ON [id number] BY K FORSYTH Performed at [facility], [address], [place], [place] [address]   POC occult blood, ED     Status: Abnormal   Collection Time: 07/10/18  6:30 PM  Result Value Ref Range   Fecal Occult Bld POSITIVE (A) NEGATIVE   Ct Abdomen Pelvis W Contrast  Result Date: 07/10/2018 CLINICAL DATA:  Left lower quadrant abdominal pain. Recent infection. On antibiotics. EXAM: CT ABDOMEN AND PELVIS WITH CONTRAST TECHNIQUE: Multidetector CT imaging of the abdomen and pelvis was performed using the standard protocol following bolus administration of intravenous contrast. CONTRAST:  123mL ISOVUE-300 IOPAMIDOL (ISOVUE-300) INJECTION 61% COMPARISON:  CT, 11/06/2013. FINDINGS: Lower chest: 6 mm nodule, subpleural right lower lobe, image 3, series 4. No acute findings. Hepatobiliary: No focal liver abnormality is seen. Status post cholecystectomy. No biliary dilatation. Pancreas: 13 mm low-density lesion in the pancreatic body. 7 mm cystic lesion along the anterior superior pancreatic head. No other pancreatic masses or lesions. No inflammation. Spleen: Normal in size without focal abnormality. Adrenals/Urinary Tract: No adrenal masses. Kidneys normal size, orientation and position. Symmetric renal enhancement and excretion. 0.4 cm cyst,  posterior mid to upper pole the left kidney. No other masses, no stones and no hydronephrosis. Normal ureters. Normal bladder. Stomach/Bowel: Mild hazy opacities noted in the fat adjacent to the proximal sigmoid colon, with there are multiple diverticula, consistent mild, uncomplicated diverticulitis. No other colonic inflammation. Stomach is unremarkable. Small bowel is normal in caliber with no wall thickening or inflammation. Vascular/Lymphatic: No enlarged lymph nodes. Aortic atherosclerosis. Reproductive: Unremarkable. Other: No abdominal wall hernia or abnormality. No abdominopelvic ascites. Musculoskeletal: No fracture or acute finding. No osteoblastic or osteolytic lesions. IMPRESSION: 1. Mild, uncomplicated diverticulitis of the proximal sigmoid colon. No extraluminal air. No abscess. 2. No other acute abnormality within the abdomen or pelvis. 3. 6 mm nodule, right lower lobe, not evident on previous  CT scans. 4. Small low-attenuation, cystic appearing, lesions in the pancreas. The larger of these, in the pancreatic body, was present on the CT dated 02/03/2010, and is unchanged. The smaller lesion along the anterior superior pancreatic head appears new. Based on consensus guidelines, recommend follow-up CT in 2 years to reassess. Electronically Signed   By: Lajean Manes M.D.   On: 07/10/2018 13:58   Dg Chest Portable 1 View  Result Date: 07/10/2018 CLINICAL DATA:  Fever and elevated white blood cell count. History of cellulitis. EXAM: PORTABLE CHEST 1 VIEW COMPARISON:  06/29/2010 FINDINGS: The cardiac silhouette, mediastinal and hilar contours are within normal limits and stable. Mild tortuosity and calcification of the thoracic aorta. The lungs are clear. No pleural effusion. No worrisome pulmonary lesions. The bony thorax is intact. IMPRESSION: No acute cardiopulmonary findings. Electronically Signed   By: Marijo Sanes M.D.   On: 07/10/2018 13:08    Pending Labs Unresulted Labs (From admission,  onward)    Start     Ordered   07/10/18 1831  Novel Coronavirus,NAA,(SEND-OUT TO REF LAB - TAT 24-48 hrs); Hosp Order  (Asymptomatic Patients Labs)  Once,   STAT    Question:  Rule Out  Answer:  Yes   07/10/18 1830          Vitals/Pain Today's Vitals   07/10/18 1805 07/10/18 1826 07/10/18 1846 07/10/18 1901  BP: (!) 111/44 (!) 72/43 (!) 99/44 (!) 113/52  Pulse:  (!) 104 100 (!) 103  Resp:  (!) 22 20 (!) 21  Temp:  (!) 102.8 F (39.3 C)    TempSrc:  Rectal    SpO2:  90% 97% 100%  PainSc:        Isolation Precautions No active isolations  Medications Medications  lactated ringers bolus 500 mL (has no administration in time range)  lactated ringers bolus 500 mL (500 mLs Intravenous New Bag/Given 07/10/18 1835)  acetaminophen (TYLENOL) tablet 1,000 mg (1,000 mg Oral Given 07/10/18 1834)    Mobility walks with person assist High fall risk   Focused Assessments Gastrointestinal:  reported hx of diverticulitis. +Guaic   R Recommendations: See Admitting Provider Note  Report given to:   Additional Notes: Pt was seen earlier; and was d/c. +guaic. Given 500 LR and APAP 1000 mg PO; a&0 x3 ; Bilateral IV access. Call if you have more ??s

## 2018-07-10 NOTE — ED Triage Notes (Signed)
Pt in with fever and "increased WBC's". Has been treated for LLE cellulitis 10+ days and states unsure if abx are working.

## 2018-07-10 NOTE — ED Notes (Signed)
Dr. Jonelle Sidle at bedside. Will upgrade pt admit status d/t low persistent low BP.

## 2018-07-10 NOTE — Sepsis Progress Note (Signed)
Notified provider and bedside nurse of need to administer antibiotics.  Spoke with RN William Hamburger who stated patient was seen earlier today and blood cultures were drawn and patient was being transferred to 5W and antibiotics IV have been ordered but will not be given until patient is in room.

## 2018-07-10 NOTE — Telephone Encounter (Signed)
Spoke with Ms. Micheal [name] on the phone regarding elevated WBC count. He does continue to have fevers with max temperature of 101 last night. He also had 2 episodes of diarrhea in the past 24 hours. Advised of options and he will be returning to the hospital for further evaluation as I do believe he may have an underlying infection of unclear origin.

## 2018-07-10 NOTE — ED Triage Notes (Signed)
Came in via EMS; c/o diverticulitis along w/ low BP and EMS reported mid 80s Spo2 on arival.

## 2018-07-10 NOTE — ED Provider Notes (Signed)
[facility] EMERGENCY DEPARTMENT Provider Note   CSN: [id number] Arrival date & time: 07/10/18  1038     History   Chief Complaint Chief Complaint  Patient presents with   Fever    HPI Micheal [name] is a 85 y.o. male.     HPI 85 year old male presents the emergency department the request of his primary care physician for elevated white blood cell count.  He was recently hospitalized for left lower extremity cellulitis and is been on Keflex.  He felt like he was improving and then began having fevers again 2 to 3 days.  Is also had worsening left lower quadrant abdominal pain and history of diverticulitis.  He reports the swelling erythema warmth of his left lower extremity has resolved.  He has no chest pain or shortness of breath.  No cough.  No new rash.  Denies flank pain.  No urinary complaints.  Feels slightly weak.  Mild headache.   Past Medical History:  Diagnosis Date   Allergic rhinitis, cause unspecified    Carpal tunnel syndrome    Cervical spondylosis without myelopathy    Cervical spondylosis without myelopathy    Cervicalgia    Diverticulosis of colon (without mention of hemorrhage)    Esophageal reflux    Hypertrophy of prostate with urinary obstruction and other lower urinary tract symptoms (LUTS)    Insomnia, unspecified    Internal hemorrhoids without mention of complication    Irritable bowel syndrome    Macular degeneration (senile) of retina, unspecified    Malignant neoplasm of prostate (HCC)    Neurogenic bladder, NOS    Neuropathy    Nonspecific (abnormal) findings on radiological and other examination of gastrointestinal tract    Orthostatic hypotension    Osteoarthrosis, unspecified whether generalized or localized, unspecified site    Other and unspecified hyperlipidemia    Other malaise and fatigue    Other specified disorder of rectum and anus    Radiation Proctitus   Paroxysmal supraventricular  tachycardia (HCC)    Restless legs syndrome (RLS)    Retinal detachment with retinal defect, unspecified    Right bundle branch block    Incomplete   Spermatocele    Right   Trigger finger (acquired)    Type II or unspecified type diabetes mellitus without mention of complication, not stated as uncontrolled    Type II or unspecified type diabetes mellitus without mention of complication, uncontrolled    Unspecified essential hypertension    patient denies, states he has a "fast heart rate, but no high BP"   Vitamin D deficiency     Patient Active Problem List   Diagnosis Date Noted   Malaise 07/08/2018   Complaints of weakness of lower extremity 06/30/2018   Open wound of lower extremity 06/30/2018   SIRS (systemic inflammatory response syndrome) ([place]) 06/29/2018   Fever    Ulnar neuropathy of left upper extremity 06/19/2018   Primary osteoarthritis of both knees 06/19/2018   Chronic cervical radiculopathy 12/30/2017   Osteoarthritis of left knee 12/03/2017   Left carpal tunnel syndrome 11/13/2017   Neck pain 10/14/2017   Paresthesia 10/14/2017   Primary osteoarthritis of right knee 06/25/2017   Age-related osteoporosis without current pathological fracture 09/06/2016   Generalized osteoarthritis of multiple sites 09/06/2016   Primary hyperparathyroidism ([place]) 07/20/2016   Weak 11/23/2015   Rotator cuff syndrome 05/25/2015   Restless leg syndrome 05/25/2015   Lumbago 09/14/2014   Right shoulder pain 09/14/2014   Knee pain,  right 05/18/2014   DM (diabetes mellitus), type 2 with peripheral vascular complications ([place]) 40/98/1191   Diabetic neuropathy ([place]) 02/24/2014   Legally blind 10/28/2013   Exudative age-related macular degeneration, bilateral, with inactive scar ([place]) 07/22/2013   Dyspnea 07/22/2013   Subretinal hemorrhage of left eye 04/24/2013   Benign fibroma of prostate 04/22/2013   H/O prostate cancer 04/10/2013   Retinal  hemorrhage of left eye 08/13/2012   Malignant neoplasm of prostate ([place]) 03/24/2012   Unspecified vitamin D deficiency 03/24/2012   Hyperlipidemia 03/24/2012   Carpal tunnel syndrome 03/24/2012   Retinal detachment, left 03/24/2012   Essential hypertension 03/24/2012   Right bundle branch block 03/24/2012   Paroxysmal supraventricular tachycardia ([place]) 03/24/2012   Internal hemorrhoids without mention of complication 47/82/9562   Orthostatic hypotension 03/24/2012   Allergic rhinitis, cause unspecified 03/24/2012   Esophageal reflux 03/24/2012   Diverticulosis of colon (without mention of hemorrhage) 03/24/2012   Irritable bowel syndrome 03/24/2012   Other specified disorder of rectum and anus 03/24/2012   Neurogenic bladder, NOS 03/24/2012   BPH (benign prostatic hyperplasia) 03/24/2012   Spermatocele 03/24/2012   Retinal detachment, right 09/21/2011    Past Surgical History:  Procedure Laterality Date   Angiolipoma  1980   Right arm   APPENDECTOMY  1951   CHOLECYSTECTOMY  01/1994   COLONOSCOPY  04/11/2010   COLONOSCOPY W/ POLYPECTOMY  2005   Removed 2 (two) polyps   DUPUYTREN CONTRACTURE RELEASE Bilateral 1989   EYE SURGERY  2006   Left   EYE SURGERY  2006   Retina reattachment, right   EYE SURGERY  02/1994   Cataract surgery, right   EYE SURGERY  01/1995   Cataract surgery, left   KNEE SURGERY  1979   PARATHYROIDECTOMY Left 12/27/2016   Procedure: LEFT INFERIOR PARATHYROIDECTOMY;  Surgeon: Armandina Gemma, MD;  Location: [facility];  Service: General;  Laterality: Left;   POLYPECTOMY  1955   Vocal cord   RETINAL DETACHMENT SURGERY Right 2005   SHOULDER ARTHROSCOPY Right 03/22/15   [place]   TENDON RELEASE  01/1997   seven fingers   TRANSURETHRAL RESECTION OF PROSTATE  08/1989   VITRECTOMY Left 04/23/2013   [place] Medications    Prior to Admission medications   Medication Sig Start Date End Date Taking?  Authorizing Provider  acetaminophen (TYLENOL) 500 MG tablet Take 1,000 mg by mouth 3 (three) times daily.   Yes [provider]  alum & mag hydroxide-simeth (MAALOX/MYLANTA) 200-200-20 MG/5ML suspension Take 30 mLs by mouth as needed for indigestion or heartburn.   Yes [provider]  cholecalciferol (VITAMIN D) 400 units TABS tablet Take 400 Units by mouth daily.   Yes [provider]  docusate sodium (COLACE) 100 MG capsule Take 100 mg by mouth daily as needed.    Yes [provider]  famotidine (PEPCID AC) 10 MG chewable tablet Chew 10 mg by mouth daily as needed for heartburn.   Yes [provider]  gabapentin (NEURONTIN) 300 MG capsule Take 1 capsule (300 mg total) by mouth 2 (two) times daily. 06/23/18  Yes Reed, Tiffany L, DO  Insulin Glargine (LANTUS SOLOSTAR) 100 UNIT/ML Solostar Pen INJECT 45 UNITS  SUBCUTANEOUSLY IN THE  MORNING Patient taking differently: Inject 45 Units into the skin every morning.  11/15/17  Yes Reed, Tiffany L, DO  loperamide (IMODIUM) 1 MG/5ML solution Take 1 mg by mouth as needed for diarrhea or loose stools.  Yes [provider]  loratadine (CLARITIN) 10 MG tablet Take 10 mg by mouth daily.   Yes [provider]  metoprolol tartrate (LOPRESSOR) 50 MG tablet TAKE 1 TABLET BY MOUTH TWO  TIMES DAILY Patient taking differently: Take 50 mg by mouth 2 (two) times daily.  04/21/18  Yes Reed, Tiffany L, DO  Probiotic Product (PROBIOTIC-10) CHEW Chew 1 each by mouth daily.    Yes [provider]  tamsulosin (FLOMAX) 0.4 MG CAPS capsule TAKE 1 CAPSULE BY MOUTH  DAILY FOR PROSTATE Patient taking differently: Take 0.4 mg by mouth every evening.  06/02/18  Yes Reed, Tiffany L, DO  trolamine salicylate (ASPERCREME) 10 % cream Apply 1 application topically as needed for muscle pain.   Yes [provider]  cephALEXin (KEFLEX) 500 MG capsule Take 500 mg by mouth 4 (four) times daily. 07/02/18   [provider]  ciprofloxacin (CIPRO) 500 MG tablet Take 1 tablet (500 mg total) by mouth 2 (two) times daily. 07/10/18   Jola Schmidt, MD  Insulin Pen Needle (B-D ULTRAFINE III SHORT PEN) 31G X 8 MM MISC Use twice daily for insulin 02/03/18   Reed, Tiffany L, DO  metroNIDAZOLE (FLAGYL) 500 MG tablet Take 1 tablet (500 mg total) by mouth 2 (two) times daily. 07/10/18   Jola Schmidt, MD    Family History Family History  Problem Relation Age of Onset   Depression Mother    Diabetes Mother    Mental illness Mother        OCD   Cerebral aneurysm Father        age 63   Hypertension Sister    Diabetes Daughter    Hyperlipidemia Daughter    Hyperparathyroidism Neg Hx     Social History Social History   Tobacco Use   Smoking status: Never Smoker   Smokeless tobacco: Never Used  Substance Use Topics   Alcohol use: No    Alcohol/week: 0.0 standard drinks   Drug use: No     Allergies   Aspirin, Atorvastatin, Prednisone, Simvastatin, Sulfa antibiotics, Latex, Metformin hcl, and Voltaren [diclofenac sodium]   Review of Systems Review of Systems  All other systems reviewed and are negative.    Physical Exam Updated Vital Signs BP 136/67 (BP Location: Right Arm)    Pulse 79    Temp 98.4 F (36.9 C) (Oral)    Resp 18    Ht 5\' 6"  (1.676 m)    Wt 70.3 kg    SpO2 98%    BMI 25.02 kg/m   Physical Exam Vitals signs and nursing note reviewed.  Constitutional:      Appearance: He is well-developed.  HENT:     Head: Normocephalic and atraumatic.  Neck:     Musculoskeletal: Normal range of motion.  Cardiovascular:     Rate and Rhythm: Normal rate and regular rhythm.     Heart sounds: Normal heart sounds.  Pulmonary:     Effort: Pulmonary effort is normal. No respiratory distress.     Breath sounds: Normal breath sounds.  Abdominal:     General: There is no distension.     Palpations: Abdomen is soft.     Comments: Left lower quadrant abdominal tenderness without  guarding or rebound.  No peritoneal signs.  Musculoskeletal: Normal range of motion.  Skin:    General: Skin is warm and dry.  Neurological:     Mental Status: He is alert and oriented to person, place, and time.  Psychiatric:  Judgment: Judgment normal.      ED Treatments / Results  Labs (all labs ordered are listed, but only abnormal results are displayed) Labs Reviewed  CBC WITH DIFFERENTIAL/PLATELET - Abnormal; Notable for the following components:      Result Value   RBC 4.01 (*)    Hemoglobin 12.2 (*)    HCT 37.1 (*)    Abs Immature Granulocytes 0.11 (*)    All other components within normal limits  COMPREHENSIVE METABOLIC PANEL - Abnormal; Notable for the following components:   Sodium 134 (*)    Glucose, Bld 188 (*)    Total Protein 6.2 (*)    Albumin 3.3 (*)    All other components within normal limits  URINALYSIS, ROUTINE W REFLEX MICROSCOPIC - Abnormal; Notable for the following components:   Color, Urine STRAW (*)    Specific Gravity, Urine 1.004 (*)    All other components within normal limits  CULTURE, BLOOD (ROUTINE X 2)  CULTURE, BLOOD (ROUTINE X 2)    EKG    Radiology Ct Abdomen Pelvis W Contrast  Result Date: 07/10/2018 CLINICAL DATA:  Left lower quadrant abdominal pain. Recent infection. On antibiotics. EXAM: CT ABDOMEN AND PELVIS WITH CONTRAST TECHNIQUE: Multidetector CT imaging of the abdomen and pelvis was performed using the standard protocol following bolus administration of intravenous contrast. CONTRAST:  137mL ISOVUE-300 IOPAMIDOL (ISOVUE-300) INJECTION 61% COMPARISON:  CT, 11/06/2013. FINDINGS: Lower chest: 6 mm nodule, subpleural right lower lobe, image 3, series 4. No acute findings. Hepatobiliary: No focal liver abnormality is seen. Status post cholecystectomy. No biliary dilatation. Pancreas: 13 mm low-density lesion in the pancreatic body. 7 mm cystic lesion along the anterior superior pancreatic head. No other pancreatic masses or  lesions. No inflammation. Spleen: Normal in size without focal abnormality. Adrenals/Urinary Tract: No adrenal masses. Kidneys normal size, orientation and position. Symmetric renal enhancement and excretion. 0.4 cm cyst, posterior mid to upper pole the left kidney. No other masses, no stones and no hydronephrosis. Normal ureters. Normal bladder. Stomach/Bowel: Mild hazy opacities noted in the fat adjacent to the proximal sigmoid colon, with there are multiple diverticula, consistent mild, uncomplicated diverticulitis. No other colonic inflammation. Stomach is unremarkable. Small bowel is normal in caliber with no wall thickening or inflammation. Vascular/Lymphatic: No enlarged lymph nodes. Aortic atherosclerosis. Reproductive: Unremarkable. Other: No abdominal wall hernia or abnormality. No abdominopelvic ascites. Musculoskeletal: No fracture or acute finding. No osteoblastic or osteolytic lesions. IMPRESSION: 1. Mild, uncomplicated diverticulitis of the proximal sigmoid colon. No extraluminal air. No abscess. 2. No other acute abnormality within the abdomen or pelvis. 3. 6 mm nodule, right lower lobe, not evident on previous CT scans. 4. Small low-attenuation, cystic appearing, lesions in the pancreas. The larger of these, in the pancreatic body, was present on the CT dated 02/03/2010, and is unchanged. The smaller lesion along the anterior superior pancreatic head appears new. Based on consensus guidelines, recommend follow-up CT in 2 years to reassess. Electronically Signed   By: Lajean Manes M.D.   On: 07/10/2018 13:58   Dg Chest Portable 1 View  Result Date: 07/10/2018 CLINICAL DATA:  Fever and elevated white blood cell count. History of cellulitis. EXAM: PORTABLE CHEST 1 VIEW COMPARISON:  06/29/2010 FINDINGS: The cardiac silhouette, mediastinal and hilar contours are within normal limits and stable. Mild tortuosity and calcification of the thoracic aorta. The lungs are clear. No pleural effusion. No  worrisome pulmonary lesions. The bony thorax is intact. IMPRESSION: No acute cardiopulmonary findings. Electronically Signed   By:  Marijo Sanes M.D.   On: 07/10/2018 13:08    Procedures Procedures (including critical care time)  Medications Ordered in ED Medications  ciprofloxacin (CIPRO) tablet 500 mg (has no administration in time range)  metroNIDAZOLE (FLAGYL) tablet 500 mg (has no administration in time range)  sodium chloride 0.9 % bolus 1,000 mL (1,000 mLs Intravenous New Bag/Given 07/10/18 1200)  iopamidol (ISOVUE-300) 61 % injection 100 mL (100 mLs Intravenous Contrast Given 07/10/18 1324)     Initial Impression / Assessment and Plan / ED Course  I have reviewed the triage vital signs and the nursing notes.  Pertinent labs & imaging results that were available during my care of the patient were reviewed by me and considered in my medical decision making (see chart for details).        Ongoing fever at home.  Left lower quadrant pain.  CT scan demonstrates uncomplicated diverticulitis.  Clinically this would fit with the patient's course.  He is overall well-appearing.  He is able to keep fluids down.  The remainder of his work-up is without significant abnormality.  His white blood cell count here in the emergency department is normal.  Blood cultures obtained.  Chest x-ray clear.  6 mm right-sided pulmonary nodule will need follow-up CT imaging in 3 months.  Patient made aware of this  Follow-up imaging of the abdomen and pelvis regarding the pancreatic cyst will need to be completed within the next 2 years.  Patient made aware of this  Final Clinical Impressions(s) / ED Diagnoses   Final diagnoses:  Acute diverticulitis    ED Discharge Orders         Ordered    ciprofloxacin (CIPRO) 500 MG tablet  2 times daily     07/10/18 1407    metroNIDAZOLE (FLAGYL) 500 MG tablet  2 times daily     07/10/18 1407           Jola Schmidt, MD 07/10/18 1438

## 2018-07-10 NOTE — ED Notes (Signed)
Called patient's emergency contact, daughter, gave update that labs and testing are currently in progress. Will give update once results are available. Daughter is able to provide patient a ride home if discharged.

## 2018-07-10 NOTE — ED Notes (Signed)
Called [facility] regarding pt's new bed assignment.

## 2018-07-10 NOTE — ED Provider Notes (Signed)
[facility] EMERGENCY DEPARTMENT Provider Note   CSN: [id number] Arrival date & time: 07/10/18  1801     History   Chief Complaint Chief Complaint  Patient presents with  . Diverticulitis    HPI NATHANIE OTTLEY is a 85 y.o. male.     The history is provided by the patient, the EMS personnel and medical records.  Abdominal Pain Pain location:  LLQ Pain quality: aching, cramping and dull   Pain radiates to:  Back Pain severity:  Moderate Onset quality:  Gradual Duration:  3 days Timing:  Intermittent Progression:  Worsening Chronicity:  New Context: awakening from sleep, eating and previous surgery   Context: not sick contacts, not suspicious food intake and not trauma   Relieved by:  Nothing Worsened by:  Palpation Ineffective treatments:  Cold, heat, eating, liquids, lying down, not moving, position changes and urination Associated symptoms: chills, diarrhea, fatigue and fever   Associated symptoms: no hematemesis, no hematochezia, no hematuria, no melena, no nausea, no shortness of breath and no vomiting   Risk factors: being elderly and recent hospitalization     Past Medical History:  Diagnosis Date  . Allergic rhinitis, cause unspecified   . Carpal tunnel syndrome   . Cervical spondylosis without myelopathy   . Cervical spondylosis without myelopathy   . Cervicalgia   . Diverticulosis of colon (without mention of hemorrhage)   . Esophageal reflux   . Hypertrophy of prostate with urinary obstruction and other lower urinary tract symptoms (LUTS)   . Insomnia, unspecified   . Internal hemorrhoids without mention of complication   . Irritable bowel syndrome   . Macular degeneration (senile) of retina, unspecified   . Malignant neoplasm of prostate ([place])   . Neurogenic bladder, NOS   . Neuropathy   . Nonspecific (abnormal) findings on radiological and other examination of gastrointestinal tract   . Orthostatic hypotension   .  Osteoarthrosis, unspecified whether generalized or localized, unspecified site   . Other and unspecified hyperlipidemia   . Other malaise and fatigue   . Other specified disorder of rectum and anus    Radiation Proctitus  . Paroxysmal supraventricular tachycardia ([place])   . Restless legs syndrome (RLS)   . Retinal detachment with retinal defect, unspecified   . Right bundle branch block    Incomplete  . Spermatocele    Right  . Trigger finger (acquired)   . Type II or unspecified type diabetes mellitus without mention of complication, not stated as uncontrolled   . Type II or unspecified type diabetes mellitus without mention of complication, uncontrolled   . Unspecified essential hypertension    patient denies, states he has a "fast heart rate, but no high BP"  . Vitamin D deficiency     Patient Active Problem List   Diagnosis Date Noted  . Malaise 07/08/2018  . Complaints of weakness of lower extremity 06/30/2018  . Open wound of lower extremity 06/30/2018  . SIRS (systemic inflammatory response syndrome) ([place]) 06/29/2018  . Fever   . Ulnar neuropathy of left upper extremity 06/19/2018  . Primary osteoarthritis of both knees 06/19/2018  . Chronic cervical radiculopathy 12/30/2017  . Osteoarthritis of left knee 12/03/2017  . Left carpal tunnel syndrome 11/13/2017  . Neck pain 10/14/2017  . Paresthesia 10/14/2017  . Primary osteoarthritis of right knee 06/25/2017  . Age-related osteoporosis without current pathological fracture 09/06/2016  . Generalized osteoarthritis of multiple sites 09/06/2016  . Primary hyperparathyroidism ([place]) 07/20/2016  . Weak  11/23/2015  . Rotator cuff syndrome 05/25/2015  . Restless leg syndrome 05/25/2015  . Lumbago 09/14/2014  . Right shoulder pain 09/14/2014  . Knee pain, right 05/18/2014  . DM (diabetes mellitus), type 2 with peripheral vascular complications ([place]) 33/29/5188  . Diabetic neuropathy ([place]) 02/24/2014  . Legally blind  10/28/2013  . Exudative age-related macular degeneration, bilateral, with inactive scar ([place]) 07/22/2013  . Dyspnea 07/22/2013  . Subretinal hemorrhage of left eye 04/24/2013  . Benign fibroma of prostate 04/22/2013  . H/O prostate cancer 04/10/2013  . Retinal hemorrhage of left eye 08/13/2012  . Malignant neoplasm of prostate ([place]) 03/24/2012  . Unspecified vitamin D deficiency 03/24/2012  . Hyperlipidemia 03/24/2012  . Carpal tunnel syndrome 03/24/2012  . Retinal detachment, left 03/24/2012  . Essential hypertension 03/24/2012  . Right bundle branch block 03/24/2012  . Paroxysmal supraventricular tachycardia ([place]) 03/24/2012  . Internal hemorrhoids without mention of complication 41/66/0630  . Orthostatic hypotension 03/24/2012  . Allergic rhinitis, cause unspecified 03/24/2012  . Esophageal reflux 03/24/2012  . Diverticulosis of colon (without mention of hemorrhage) 03/24/2012  . Irritable bowel syndrome 03/24/2012  . Other specified disorder of rectum and anus 03/24/2012  . Neurogenic bladder, NOS 03/24/2012  . BPH (benign prostatic hyperplasia) 03/24/2012  . Spermatocele 03/24/2012  . Retinal detachment, right 09/21/2011    Past Surgical History:  Procedure Laterality Date  . Angiolipoma  1980   Right arm  . APPENDECTOMY  1951  . CHOLECYSTECTOMY  01/1994  . COLONOSCOPY  04/11/2010  . COLONOSCOPY W/ POLYPECTOMY  2005   Removed 2 (two) polyps  . DUPUYTREN CONTRACTURE RELEASE Bilateral 1989  . EYE SURGERY  2006   Left  . EYE SURGERY  2006   Retina reattachment, right  . EYE SURGERY  02/1994   Cataract surgery, right  . EYE SURGERY  01/1995   Cataract surgery, left  . KNEE SURGERY  1979  . PARATHYROIDECTOMY Left 12/27/2016   Procedure: LEFT INFERIOR PARATHYROIDECTOMY;  Surgeon: Armandina Gemma, MD;  Location: [facility] ORS;  Service: General;  Laterality: Left;  . POLYPECTOMY  1955   Vocal cord  . RETINAL DETACHMENT SURGERY Right 2005  . SHOULDER ARTHROSCOPY Right 03/22/15    [place]  . TENDON RELEASE  01/1997   seven fingers  . TRANSURETHRAL RESECTION OF PROSTATE  08/1989  . VITRECTOMY Left 04/23/2013   [facility] Medications    Prior to Admission medications   Medication Sig Start Date End Date Taking? Authorizing Provider  acetaminophen (TYLENOL) 500 MG tablet Take 1,000 mg by mouth 3 (three) times daily.    [provider]  alum & mag hydroxide-simeth (MAALOX/MYLANTA) 200-200-20 MG/5ML suspension Take 30 mLs by mouth as needed for indigestion or heartburn.    [provider]  cephALEXin (KEFLEX) 500 MG capsule Take 500 mg by mouth 4 (four) times daily. 07/02/18   [provider]  cholecalciferol (VITAMIN D) 400 units TABS tablet Take 400 Units by mouth daily.    [provider]  ciprofloxacin (CIPRO) 500 MG tablet Take 1 tablet (500 mg total) by mouth 2 (two) times daily. 07/10/18   Jola Schmidt, MD  docusate sodium (COLACE) 100 MG capsule Take 100 mg by mouth daily as needed.     [provider]  famotidine (PEPCID AC) 10 MG chewable tablet Chew 10 mg by mouth daily as needed for heartburn.    [provider]  gabapentin (NEURONTIN) 300 MG capsule Take 1 capsule (300  mg total) by mouth 2 (two) times daily. 06/23/18   Reed, Tiffany L, DO  Insulin Glargine (LANTUS SOLOSTAR) 100 UNIT/ML Solostar Pen INJECT 45 UNITS  SUBCUTANEOUSLY IN THE  MORNING Patient taking differently: Inject 45 Units into the skin every morning.  11/15/17   Reed, Tiffany L, DO  Insulin Pen Needle (B-D ULTRAFINE III SHORT PEN) 31G X 8 MM MISC Use twice daily for insulin 02/03/18   Reed, Tiffany L, DO  loperamide (IMODIUM) 1 MG/5ML solution Take 1 mg by mouth as needed for diarrhea or loose stools.    [provider]  loratadine (CLARITIN) 10 MG tablet Take 10 mg by mouth daily.    [provider]  metoprolol tartrate (LOPRESSOR) 50 MG tablet TAKE 1 TABLET BY MOUTH TWO  TIMES DAILY Patient taking  differently: Take 50 mg by mouth 2 (two) times daily.  04/21/18   Reed, Tiffany L, DO  metroNIDAZOLE (FLAGYL) 500 MG tablet Take 1 tablet (500 mg total) by mouth 2 (two) times daily. 07/10/18   Jola Schmidt, MD  Probiotic Product (PROBIOTIC-10) CHEW Chew 1 each by mouth daily.     [provider]  tamsulosin (FLOMAX) 0.4 MG CAPS capsule TAKE 1 CAPSULE BY MOUTH  DAILY FOR PROSTATE Patient taking differently: Take 0.4 mg by mouth every evening.  06/02/18   Reed, Tiffany L, DO  trolamine salicylate (ASPERCREME) 10 % cream Apply 1 application topically as needed for muscle pain.    [provider]    Family History Family History  Problem Relation Age of Onset  . Depression Mother   . Diabetes Mother   . Mental illness Mother        OCD  . Cerebral aneurysm Father        age 5  . Hypertension Sister   . Diabetes Daughter   . Hyperlipidemia Daughter   . Hyperparathyroidism Neg Hx     Social History Social History   Tobacco Use  . Smoking status: Never Smoker  . Smokeless tobacco: Never Used  Substance Use Topics  . Alcohol use: No    Alcohol/week: 0.0 standard drinks  . Drug use: No     Allergies   Aspirin, Atorvastatin, Prednisone, Simvastatin, Sulfa antibiotics, Latex, Metformin hcl, and Voltaren [diclofenac sodium]   Review of Systems Review of Systems  Constitutional: Positive for chills, fatigue and fever.  Respiratory: Negative for shortness of breath.   Gastrointestinal: Positive for abdominal pain and diarrhea. Negative for hematemesis, hematochezia, melena, nausea and vomiting.  Genitourinary: Negative for hematuria.  All other systems reviewed and are negative.    Physical Exam Updated Vital Signs BP (!) 111/44   Pulse (!) 101   Temp (!) 100.6 F (38.1 C) (Oral)   SpO2 96%   Physical Exam Vitals signs and nursing note reviewed.  Constitutional:      Appearance: He is well-developed.     Comments: Appears fatigued  HENT:     Head:  Normocephalic and atraumatic.  Eyes:     Conjunctiva/sclera: Conjunctivae normal.  Neck:     Musculoskeletal: Neck supple.  Cardiovascular:     Rate and Rhythm: Tachycardia present.  Pulmonary:     Effort: Pulmonary effort is normal. No respiratory distress.     Breath sounds: No stridor. No wheezing or rhonchi.  Abdominal:     Palpations: Abdomen is soft.     Tenderness: There is abdominal tenderness. There is no right CVA tenderness, left CVA tenderness, guarding or rebound.  Comments: LLQ tenderness with palpation   Genitourinary:    Comments: No gross blood per rectum Skin:    General: Skin is warm and dry.     Comments: Healing wound left lower extremity posterior calf No obvious purulent or bloody drainage from wound  Neurological:     Mental Status: He is alert and oriented to person, place, and time.      ED Treatments / Results  Labs (all labs ordered are listed, but only abnormal results are displayed) Labs Reviewed - No data to display  EKG    Radiology Ct Abdomen Pelvis W Contrast  Result Date: 07/10/2018 CLINICAL DATA:  Left lower quadrant abdominal pain. Recent infection. On antibiotics. EXAM: CT ABDOMEN AND PELVIS WITH CONTRAST TECHNIQUE: Multidetector CT imaging of the abdomen and pelvis was performed using the standard protocol following bolus administration of intravenous contrast. CONTRAST:  152mL ISOVUE-300 IOPAMIDOL (ISOVUE-300) INJECTION 61% COMPARISON:  CT, 11/06/2013. FINDINGS: Lower chest: 6 mm nodule, subpleural right lower lobe, image 3, series 4. No acute findings. Hepatobiliary: No focal liver abnormality is seen. Status post cholecystectomy. No biliary dilatation. Pancreas: 13 mm low-density lesion in the pancreatic body. 7 mm cystic lesion along the anterior superior pancreatic head. No other pancreatic masses or lesions. No inflammation. Spleen: Normal in size without focal abnormality. Adrenals/Urinary Tract: No adrenal masses. Kidneys normal  size, orientation and position. Symmetric renal enhancement and excretion. 0.4 cm cyst, posterior mid to upper pole the left kidney. No other masses, no stones and no hydronephrosis. Normal ureters. Normal bladder. Stomach/Bowel: Mild hazy opacities noted in the fat adjacent to the proximal sigmoid colon, with there are multiple diverticula, consistent mild, uncomplicated diverticulitis. No other colonic inflammation. Stomach is unremarkable. Small bowel is normal in caliber with no wall thickening or inflammation. Vascular/Lymphatic: No enlarged lymph nodes. Aortic atherosclerosis. Reproductive: Unremarkable. Other: No abdominal wall hernia or abnormality. No abdominopelvic ascites. Musculoskeletal: No fracture or acute finding. No osteoblastic or osteolytic lesions. IMPRESSION: 1. Mild, uncomplicated diverticulitis of the proximal sigmoid colon. No extraluminal air. No abscess. 2. No other acute abnormality within the abdomen or pelvis. 3. 6 mm nodule, right lower lobe, not evident on previous CT scans. 4. Small low-attenuation, cystic appearing, lesions in the pancreas. The larger of these, in the pancreatic body, was present on the CT dated 02/03/2010, and is unchanged. The smaller lesion along the anterior superior pancreatic head appears new. Based on consensus guidelines, recommend follow-up CT in 2 years to reassess. Electronically Signed   By: Lajean Manes M.D.   On: 07/10/2018 13:58   Dg Chest Portable 1 View  Result Date: 07/10/2018 CLINICAL DATA:  Fever and elevated white blood cell count. History of cellulitis. EXAM: PORTABLE CHEST 1 VIEW COMPARISON:  06/29/2010 FINDINGS: The cardiac silhouette, mediastinal and hilar contours are within normal limits and stable. Mild tortuosity and calcification of the thoracic aorta. The lungs are clear. No pleural effusion. No worrisome pulmonary lesions. The bony thorax is intact. IMPRESSION: No acute cardiopulmonary findings. Electronically Signed   By: Marijo Sanes M.D.   On: 07/10/2018 13:08    Procedures Procedures (including critical care time)  Medications Ordered in ED Medications - No data to display   Initial Impression / Assessment and Plan / ED Course  I have reviewed the triage vital signs and the nursing notes.  Pertinent labs & imaging results that were available during my care of the patient were reviewed by me and considered in my medical decision making (see chart  for details).        Medical Decision Making: LYFE MONGER is a 85 y.o. male who presented to the ED today with LLQ pain.  Past medical history significant for osteoarthritis, chronic cervical radiculopathy, prostate cancer status post radiation, hypertension, and insulin-dependent diabetes mellitus Reviewed and confirmed nursing documentation for past medical history, family history, social history.  On my initial exam, the pt was appears fatigued, cooperative, conversant, follows commands appropriately, GCS 15, heart rate 104 bpm, blood pressure 80s over 40s, febrile, mentating appropriately, no signs of impending respiratory failure  Seen here in ED earlier today, discharged with antibiotics for uncomplicated diverticulitis, worsening pain and feeling worse at home so called EMS, blood pressure low per EMS 90s over 50s Patient mentating appropriately with Sirs/sepsis vitals, given antibiotics around 2 PM, not due for repeat dosing for several hours, will give fluid bolus and reassess blood pressure Stool guaiac positive Patient given antipyretic  EKG (my interpretation):      NS rhythm with a rate of  105.      QRS 139. QTc 484. PR 151      Right bundle branch block, nonspecific ST segment changes.      No acute changes suggestive of hyperkalemia.      No WPW, LQTS, or Brugada's Syndrome. When compared to previous ECGs from 06/29/18 change no new concerning changes found.  BP 111/72 after first 500 cc bolus, lactic acid 3.5  All radiology and  laboratory studies reviewed independently and with my attending physician, agree with reading provided by radiologist unless otherwise noted.  Upon reassessing patient, patient was resting comfortably, awaiting admission Based on the above findings, I believe patient requires admission. Patient admitted. The above care was discussed with and agreed upon by my attending physician. Emergency Department Medication Summary:  Medications  lactated ringers bolus 500 mL (has no administration in time range)  acetaminophen (TYLENOL) tablet 1,000 mg (has no administration in time range)   Final Clinical Impressions(s) / ED Diagnoses   Final diagnoses:  None    ED Discharge Orders    None       Lonzo Candy, MD 07/10/18 Hadassah Pais, MD 07/10/18 Curly Rim

## 2018-07-10 NOTE — Discharge Instructions (Signed)
You have a right-sided pulmonary nodule which will need a follow-up CT scan in 3 months.  This can be ordered by your primary care physician  There is a small cyst on your pancreas which will need follow-up with CT imaging in 2 years

## 2018-07-11 LAB — COMPREHENSIVE METABOLIC PANEL
ALT: 23 U/L (ref 0–44)
AST: 29 U/L (ref 15–41)
Albumin: 2.4 g/dL — ABNORMAL LOW (ref 3.5–5.0)
Alkaline Phosphatase: 57 U/L (ref 38–126)
Anion gap: 9 (ref 5–15)
BUN: 16 mg/dL (ref 8–23)
CO2: 20 mmol/L — ABNORMAL LOW (ref 22–32)
Calcium: 7.8 mg/dL — ABNORMAL LOW (ref 8.9–10.3)
Chloride: 108 mmol/L (ref 98–111)
Creatinine, Ser: 1.29 mg/dL — ABNORMAL HIGH (ref 0.61–1.24)
GFR calc Af Amer: 57 mL/min — ABNORMAL LOW (ref >60)
GFR calc non Af Amer: 49 mL/min — ABNORMAL LOW (ref >60)
Glucose, Bld: 119 mg/dL — ABNORMAL HIGH (ref 70–99)
Potassium: 3.6 mmol/L (ref 3.5–5.1)
Sodium: 137 mmol/L (ref 135–145)
Total Bilirubin: 0.6 mg/dL (ref 0.3–1.2)
Total Protein: 4.4 g/dL — ABNORMAL LOW (ref 6.5–8.1)

## 2018-07-11 LAB — GLUCOSE, CAPILLARY
Glucose-Capillary: 71 mg/dL (ref 70–99)
Glucose-Capillary: 138 mg/dL — ABNORMAL HIGH (ref 70–99)
Glucose-Capillary: 138 mg/dL — ABNORMAL HIGH (ref 70–99)
Glucose-Capillary: 94 mg/dL (ref 70–99)

## 2018-07-11 LAB — CBC
HCT: 33.5 % — ABNORMAL LOW (ref 39.0–52.0)
Hemoglobin: 10.9 g/dL — ABNORMAL LOW (ref 13.0–17.0)
MCH: 29.9 pg (ref 26.0–34.0)
MCHC: 32.5 g/dL (ref 30.0–36.0)
MCV: 92 fL (ref 80.0–100.0)
Platelets: 253 10*3/uL (ref 150–400)
RBC: 3.64 MIL/uL — ABNORMAL LOW (ref 4.22–5.81)
RDW: 12.4 % (ref 11.5–15.5)
WBC: 17.1 10*3/uL — ABNORMAL HIGH (ref 4.0–10.5)
nRBC: 0 % (ref 0.0–0.2)

## 2018-07-11 MED ORDER — SODIUM CHLORIDE 0.9 % IV BOLUS
1000.0000 mL | Freq: Once | INTRAVENOUS | Status: AC
  Administered 2018-07-11: 1000 mL via INTRAVENOUS

## 2018-07-11 MED ORDER — MORPHINE SULFATE (PF) 2 MG/ML IV SOLN
2.0000 mg | INTRAVENOUS | Status: DC | PRN
  Administered 2018-07-11: 2 mg via INTRAVENOUS
  Filled 2018-07-11: qty 1

## 2018-07-11 MED ORDER — SODIUM CHLORIDE 0.9 % IV BOLUS
500.0000 mL | Freq: Once | INTRAVENOUS | Status: AC
  Administered 2018-07-11: 500 mL via INTRAVENOUS

## 2018-07-11 MED ORDER — TRAMADOL HCL 50 MG PO TABS
50.0000 mg | ORAL_TABLET | Freq: Four times a day (QID) | ORAL | Status: DC | PRN
  Administered 2018-07-11: 50 mg via ORAL
  Filled 2018-07-11: qty 1

## 2018-07-11 MED ORDER — GLUCERNA SHAKE PO LIQD
237.0000 mL | Freq: Three times a day (TID) | ORAL | Status: DC
  Administered 2018-07-11 – 2018-07-12 (×3): 237 mL via ORAL

## 2018-07-11 MED ORDER — SODIUM CHLORIDE 0.9 % IV SOLN
INTRAVENOUS | Status: DC
  Administered 2018-07-11 (×2): 1000 mL via INTRAVENOUS
  Administered 2018-07-12: 02:00:00 via INTRAVENOUS

## 2018-07-11 MED ORDER — ADULT MULTIVITAMIN W/MINERALS CH
1.0000 | ORAL_TABLET | Freq: Every day | ORAL | Status: DC
  Administered 2018-07-11 – 2018-07-12 (×2): 1 via ORAL
  Filled 2018-07-11 (×2): qty 1

## 2018-07-11 MED ORDER — GABAPENTIN 400 MG PO CAPS
400.0000 mg | ORAL_CAPSULE | Freq: Once | ORAL | Status: AC
  Administered 2018-07-11: 400 mg via ORAL
  Filled 2018-07-11: qty 1

## 2018-07-11 MED ORDER — GABAPENTIN 300 MG PO CAPS
300.0000 mg | ORAL_CAPSULE | Freq: Three times a day (TID) | ORAL | Status: DC
  Administered 2018-07-11 – 2018-07-12 (×3): 300 mg via ORAL
  Filled 2018-07-11 (×3): qty 1

## 2018-07-11 NOTE — Progress Notes (Signed)
Pt c/o restless legs at approx. 0200. Pt stated that it felt like a slight possible nerve pain, and it was keeping him from sleeping. On call NP Blount notified.  Pt now c/o severe nerve pain, especially in his R leg. Pt stated that the pain comes in waves and his leg feels like its throbbing. On call NP Blount notified. Awaiting orders. Will continue to monitor.

## 2018-07-11 NOTE — Plan of Care (Signed)
  Problem: Clinical Measurements: Goal: Diagnostic test results will improve Outcome: Progressing   Problem: Pain Managment: Goal: General experience of comfort will improve Outcome: Not Progressing   Problem: Safety: Goal: Ability to remain free from injury will improve Outcome: Progressing   Problem: Skin Integrity: Goal: Risk for impaired skin integrity will decrease Outcome: Progressing

## 2018-07-11 NOTE — Progress Notes (Addendum)
Patient's daughter Micheal [name] called Probation officer and ask for updates on her father. Her name wasn't on the contact list and I asked the patient if I can give information about him to [employer]. Patient said that Micheal [name] is his daughter and he will appreciate if I will talk with her. Micheal [name] was informed about patient's condition.

## 2018-07-11 NOTE — Progress Notes (Signed)
Pt arrived to unit from ED. Transported by Anabel Halon. Alert and oriented x4. No apparent distress. IV's in place. Side rails up. Pt instructed to press call light for any assistance. Call light within reach. Bed alarm on and in lowest position. Pt denied placing belongings in safe. Belongings placed in room closet.

## 2018-07-11 NOTE — Progress Notes (Signed)
Initial Nutrition Assessment  DOCUMENTATION CODES:   Not applicable  INTERVENTION:   -Glucerna Shake po TID, each supplement provides 220 kcal and 10 grams of protein -MVI with minerals daily  NUTRITION DIAGNOSIS:   Increased nutrient needs related to acute illness(sepsis) as evidenced by estimated needs.  GOAL:   Patient will meet greater than or equal to 90% of their needs  MONITOR:   PO intake, Supplement acceptance, Diet advancement, Labs, Weight trends, I & O's  REASON FOR ASSESSMENT:   Malnutrition Screening Tool    ASSESSMENT:   Micheal [name] is a 85 y.o. male with medical history significant of diabetes, GERD, cervical spondylosis, hypertension, irritable bowel syndrome neurogenic bladder, history of malignant prostate cancer, previous diverticulitis, paroxysmal supraventricular tachycardia who presented to the ER initially with fever and abdominal pain.  He was recently admitted and treated for lower extremity cellulitis and placed on Keflex which he completed.  He came back with left lower quadrant abdominal pain and fever.  Primary care physician evaluated the patient.  He was diagnosed with acute diverticulitis and discharged home on antibiotics.  Patient went home and could not even get out of bed.  His fever came back.  He was feeling bad and came back to the ER where he was found to be septic with increased lactic acid as well as more pain.  He is not been admitted to the hospital for treatment.  Pt admitted with sepsis with hypotension  Reviewed I/O's: +769 ml x 24 hours  UOP: 100 ml x 24 hours  Attempted to speak with pt x 3, however,either receiving nursing care or in with staff members at times of visit.   Reviewed wt hx; wt has been stable over the past year.   No documented intake recorded. Pt would benefit from addition of nutritional supplements due to limitations of full liquid diet.   Lab Results  Component Value Date   HGBA1C 7.8 (H)  04/03/2018   PTA DM medications are 45 units insulin glargine daily. Per ADA's Standards of Medical Care of Diabetes, glycemic targets for older adults who have multiple co-morbidities, cognitive impairments, and functional dependence should be less stringent (Hgb A1c <8.0-8.5).    Labs reviewed: CBGS: 80-87 (inpatient orders for glycemic control are 0-5 units insulin aspart q HS, 0-9 units insulin aspart TID with meals, and 45 units insulin glargine daily).   NUTRITION - FOCUSED PHYSICAL EXAM:    Most Recent Value  Orbital Region  Unable to assess  Upper Arm Region  Unable to assess  Thoracic and Lumbar Region  Unable to assess  Buccal Region  Unable to assess  Temple Region  Unable to assess  Clavicle Bone Region  Unable to assess  Clavicle and Acromion Bone Region  Unable to assess  Scapular Bone Region  Unable to assess  Dorsal Hand  Unable to assess  Patellar Region  Unable to assess  Anterior Thigh Region  Unable to assess  Posterior Calf Region  Unable to assess  Edema (RD Assessment)  Unable to assess  Hair  Unable to assess  Eyes  Unable to assess  Mouth  Unable to assess  Skin  Unable to assess  Nails  Unable to assess      Diet Order:   Diet Order            Diet full liquid Room service appropriate? Yes; Fluid consistency: Thin  Diet effective now  EDUCATION NEEDS:   No education needs have been identified at this time  Skin:  Skin Assessment: Reviewed RN Assessment  Last BM:  07/10/18  Height:   Ht Readings from Last 1 Encounters:  07/11/18 5\' 6"  (1.676 m)    Weight:   Wt Readings from Last 1 Encounters:  07/11/18 70.3 kg    Ideal Body Weight:  64.5 kg  BMI:  Body mass index is 25.01 kg/m.  Estimated Nutritional Needs:   Kcal:  1550-1750  Protein:  75-90 grams  Fluid:  > 1.5 L    Alisan Dokes A. Jimmye Norman, RD, LDN, [place] Registered Dietitian II Certified Diabetes Care and Education Specialist Pager: [phone] After hours  Pager: [phone]

## 2018-07-11 NOTE — Progress Notes (Signed)
Pt manual BP 70/38. Pt a&ox4. Pt alert, talking, and denies feeling dizzy or lightheaded. On call NP Blount notified. One time 1 L bolus given. Will continue to monitor.

## 2018-07-11 NOTE — Evaluation (Signed)
Physical Therapy Evaluation Patient Details Name: Micheal [name] MRN: [id number] DOB: 1928/02/10 Today's Date: 07/11/2018   History of Present Illness  85 yo male with onset of sepsis with hypotension has been in hosp for three admissions including this recently.  Initial 6/6 with SIRS, went on antibiotics, noted cellulitis of LE's.  Now readmitted with fever, sepsis, leukocytosis and diverticulitis, with L side pain.  PMHx:  GERD, IBS, prostate CA w radiation, RLS, PSVT, DM, RBBB, HTN, HLD, cervical spondylosis, neurogenic bladder,   Clinical Impression  Pt is seen for evaluation and noted his effort for all tasks was good, able to maintain 95-97% O2 sats on room air.  Has been troubled by L side pain from his suspected DM neuropathy, but is relieved currently with pain meds.  Follow acutely for progression of all gait and transfers, and will anticipate being able to sent home with HHPT, plan in near future to get stairs completed.    Follow Up Recommendations Home health PT;Supervision for mobility/OOB    Equipment Recommendations  None recommended by PT    Recommendations for Other Services       Precautions / Restrictions Precautions Precautions: Fall Precaution Comments: monitor O2 sats and BP Restrictions Weight Bearing Restrictions: No      Mobility  Bed Mobility Overal bed mobility: Needs Assistance Bed Mobility: Supine to Sit;Sit to Supine     Supine to sit: Min assist Sit to supine: Min assist      Transfers Overall transfer level: Needs assistance Equipment used: Rolling walker (2 wheeled) Transfers: Sit to/from Stand Sit to Stand: Min assist         General transfer comment: pt is preferring to use walker to stand so must redirect  Ambulation/Gait Ambulation/Gait assistance: Min guard Gait Distance (Feet): 18 Feet Assistive device: Rolling walker (2 wheeled) Gait Pattern/deviations: Step-through pattern;Decreased stride length;Wide base of  support;Trunk flexed Gait velocity: decreased Gait velocity interpretation: <1.31 ft/sec, indicative of household ambulator General Gait Details: able to move walker without assist  Stairs            Wheelchair Mobility    Modified Rankin (Stroke Patients Only)       Balance Overall balance assessment: Needs assistance Sitting-balance support: Feet supported Sitting balance-Leahy Scale: Good     Standing balance support: Bilateral upper extremity supported;During functional activity Standing balance-Leahy Scale: Fair                               Pertinent Vitals/Pain Pain Assessment: No/denies pain Pain Location: L side when it occurs Pain Descriptors / Indicators: Stabbing;Burning    Home Living Family/patient expects to be discharged to:: Private residence Living Arrangements: Spouse/significant other Available Help at Discharge: Family;Available 24 hours/day Type of Home: House Home Access: Stairs to enter Entrance Stairs-Rails: Right;Left;Can reach both Entrance Stairs-Number of Steps: 6 Home Layout: One level Home Equipment: Walker - 2 wheels;Cane - single point;Wheelchair - manual;Grab bars - tub/shower;Grab bars - toilet      Prior Function Level of Independence: Independent with assistive device(s)         Comments: used cane most of time per pt     Hand Dominance        Extremity/Trunk Assessment   Upper Extremity Assessment Upper Extremity Assessment: Overall WFL for tasks assessed    Lower Extremity Assessment Lower Extremity Assessment: Generalized weakness    Cervical / Trunk Assessment Cervical / Trunk Assessment: Normal  Communication   Communication: No difficulties  Cognition Arousal/Alertness: Awake/alert Behavior During Therapy: WFL for tasks assessed/performed Overall Cognitive Status: Within Functional Limits for tasks assessed                                        General Comments       Exercises     Assessment/Plan    PT Assessment Patient needs continued PT services  PT Problem List Decreased strength;Decreased range of motion;Decreased activity tolerance;Decreased balance;Decreased mobility;Decreased coordination;Cardiopulmonary status limiting activity       PT Treatment Interventions DME instruction;Gait training;Stair training;Functional mobility training;Therapeutic activities;Therapeutic exercise;Balance training;Neuromuscular re-education;Patient/family education    PT Goals (Current goals can be found in the Care Plan section)  Acute Rehab PT Goals Patient Stated Goal: to be home with his wife again PT Goal Formulation: With patient Time For Goal Achievement: 07/25/18 Potential to Achieve Goals: Good    Frequency Min 3X/week   Barriers to discharge Inaccessible home environment stairs with rails to enter house    Co-evaluation               AM-PAC PT "6 Clicks" Mobility  Outcome Measure Help needed turning from your back to your side while in a flat bed without using bedrails?: A Little Help needed moving from lying on your back to sitting on the side of a flat bed without using bedrails?: A Little Help needed moving to and from a bed to a chair (including a wheelchair)?: A Little Help needed standing up from a chair using your arms (e.g., wheelchair or bedside chair)?: A Little Help needed to walk in hospital room?: A Little Help needed climbing 3-5 steps with a railing? : A Lot 6 Click Score: 17    End of Session Equipment Utilized During Treatment: Gait belt Activity Tolerance: Patient tolerated treatment well;Patient limited by fatigue Patient left: in bed;with call bell/phone within reach;with bed alarm set Nurse Communication: Mobility status PT Visit Diagnosis: Unsteadiness on feet (R26.81);Muscle weakness (generalized) (M62.81)    Time: 1411-1440 PT Time Calculation (min) (ACUTE ONLY): 29 min   Charges:   PT  Evaluation $PT Eval Moderate Complexity: 1 Mod PT Treatments $Gait Training: 8-22 mins       Ramond Dial 07/11/2018, 3:03 PM  Mee Hives, PT MS Acute Rehab Dept. Number: [place] [facility] [place]

## 2018-07-11 NOTE — Progress Notes (Signed)
Patient ID: Micheal [name], male   DOB: 02-09-1928, 85 y.o.   MRN: [id number]  PROGRESS NOTE    Micheal [name]  GQQ:[id number] DOB: 06/01/1928 DOA: 07/10/2018 PCP: Gayland Curry, DO   Brief Narrative:  85 year old male with history of diabetes mellitus type 2, GERD, cervical spondylosis, hypertension, irritable bowel syndrome, neurogenic bladder, pressure cancer, previous diverticulitis, PSVT presented on 07/10/2018 with abdominal pain and fever.  CT of the abdomen pelvis showed mild uncomplicated diverticulitis of the proximal sigmoid colon.  He was started on IV antibiotics.  Assessment & Plan:   Principal Problem:   Sepsis ([place]) Active Problems:   Hyperlipidemia   Essential hypertension   Paroxysmal supraventricular tachycardia ([facility])   DM (diabetes mellitus), type 2 with peripheral vascular complications ([place])   H/O prostate cancer   Acute diverticulitis  Sepsis with hypotension: Present on admission, most likely secondary to diverticulitis -Antibiotic plan as below.  Blood pressure still on the lower side.  Follow cultures.  Normal saline at 75 cc an hour -Hold antihypertensives for now.  Acute sigmoid diverticulitis -Continue ciprofloxacin and Flagyl.  Abdominal pain is improving.  Advance diet to full liquid diet.  Leukocytosis -Probably from above.  Improving.  Monitor  Diabetes mellitus type 2 -Blood sugars on the lower side.  Decrease Lantus to 20 units daily.  Continue CBGs with SSI.  History of prostate cancer -Stable.  Outpatient follow-up  History of chronic neck pain/chronic low back pain/knee pain Bilateral upper extremity paresthesias -Patient has had extensive work-up for the same including MRI of the cervical spine last year with recent MRI of the lumbosacral spine had shown no significant canal stenosis or evidence of spinal cord compression.  He might need outpatient neurosurgery evaluation as well. -Patient complaining of severe pain in the left upper  extremity including left wrist.  EMG nerve conduction study done as an outpatient had showed severe left carpal tunnel syndrome.  He follows up with neurology as outpatient.  Continue gabapentin but increase it to 300 mg 3 times a day.  Outpatient follow-up with neurology. -PT eval.   DVT prophylaxis: Heparin Code Status: Full Family Communication: None at bedside Disposition Plan: Home in 1 to 2 days once clinically improved  Consultants: None  Procedures: None  Antimicrobials: Cipro and Flagyl from 07/10/2018 onwards   Subjective: Patient seen and examined at bedside.  He states that his abdominal pain is improving but he complains of severe left knee pain and left upper extremity pain including wrist pain and numbness.  No overnight fever or vomiting.  Objective: Vitals:   07/11/18 0105 07/11/18 0141 07/11/18 0407 07/11/18 0801  BP: (!) 128/115 (!) 70/38 (!) 121/48 (!) 82/45  Pulse: 90  (!) 101 97  Resp: 16  16 20   Temp: 98.4 F (36.9 C)  98.2 F (36.8 C) 98.6 F (37 C)  TempSrc: Oral  Oral Oral  SpO2: 96%  96% 98%    Intake/Output Summary (Last 24 hours) at 07/11/2018 1025 Last data filed at 07/11/2018 0805 Gross per 24 hour  Intake 869.02 ml  Output 200 ml  Net 669.02 ml   There were no vitals filed for this visit.  Examination:  General exam: Appears in mild distress secondary to pain.  Elderly male lying in bed. Respiratory system: Bilateral decreased breath sounds at bases with scattered crackles Cardiovascular system: S1 & S2 heard, intermittently tachycardic gastrointestinal system: Abdomen is nondistended, soft and mild lower quadrant tenderness present. Normal bowel sounds heard. Extremities: No cyanosis,  clubbing, edema.  No left knee, shoulder, elbow or wrist swelling or redness. Central nervous system: Alert and oriented. No focal neurological deficits. Moving extremities Skin: No rashes, lesions or ulcers Psychiatry: Looks extremely anxious    Data  Reviewed: I have personally reviewed following labs and imaging studies  CBC: Recent Labs  Lab 07/08/18 1625 07/10/18 1130 07/10/18 2149 07/11/18 0259  WBC 21.3* 9.5 19.8* 17.1*  NEUTROABS 18,169* 7.0  --   --   HGB 13.0* 12.2* 11.4* 10.9*  HCT 38.4* 37.1* 34.0* 33.5*  MCV 89.3 92.5 90.7 92.0  PLT 339 295 267 993   Basic Metabolic Panel: Recent Labs  Lab 07/08/18 1625 07/10/18 1130 07/10/18 2149 07/11/18 0259  NA 134* 134*  --  137  K 5.3 3.9  --  3.6  CL 100 103  --  108  CO2 23 22  --  20*  GLUCOSE 189* 188*  --  119*  BUN 19 14  --  16  CREATININE 1.16* 0.94 1.35* 1.29*  CALCIUM 9.6 8.9  --  7.8*   GFR: Estimated Creatinine Clearance: 35.7 mL/min (A) (by C-G formula based on SCr of 1.29 mg/dL (H)). Liver Function Tests: Recent Labs  Lab 07/08/18 1625 07/10/18 1130 07/11/18 0259  AST 14 22 29   ALT 25 23 23   ALKPHOS  --  75 57  BILITOT 0.5 0.7 0.6  PROT 6.1 6.2* 4.4*  ALBUMIN  --  3.3* 2.4*   No results for input(s): LIPASE, AMYLASE in the last 168 hours. No results for input(s): AMMONIA in the last 168 hours. Coagulation Profile: No results for input(s): INR, PROTIME in the last 168 hours. Cardiac Enzymes: No results for input(s): CKTOTAL, CKMB, CKMBINDEX, TROPONINI in the last 168 hours. BNP (last 3 results) No results for input(s): PROBNP in the last 8760 hours. HbA1C: No results for input(s): HGBA1C in the last 72 hours. CBG: Recent Labs  Lab 07/10/18 2048 07/10/18 2140 07/11/18 0754  GLUCAP 87 80 71   Lipid Profile: No results for input(s): CHOL, HDL, LDLCALC, TRIG, CHOLHDL, LDLDIRECT in the last 72 hours. Thyroid Function Tests: No results for input(s): TSH, T4TOTAL, FREET4, T3FREE, THYROIDAB in the last 72 hours. Anemia Panel: No results for input(s): VITAMINB12, FOLATE, FERRITIN, TIBC, IRON, RETICCTPCT in the last 72 hours. Sepsis Labs: Recent Labs  Lab 07/10/18 1827 07/10/18 1952  LATICACIDVEN 3.5* 2.4*    Recent Results (from  the past 240 hour(s))  Blood culture (routine x 2)     Status: None (Preliminary result)   Collection Time: 07/10/18 11:50 AM   Specimen: BLOOD  Result Value Ref Range Status   Specimen Description BLOOD LEFT ANTECUBITAL  Final   Special Requests   Final    BOTTLES DRAWN AEROBIC AND ANAEROBIC Blood Culture adequate volume   Culture   Final    NO GROWTH < 24 HOURS Performed at [facility], [address] [place], [place] [address]    Report Status PENDING  Incomplete  Blood culture (routine x 2)     Status: None (Preliminary result)   Collection Time: 07/10/18 12:00 PM   Specimen: BLOOD RIGHT FOREARM  Result Value Ref Range Status   Specimen Description BLOOD RIGHT FOREARM  Final   Special Requests   Final    BOTTLES DRAWN AEROBIC AND ANAEROBIC Blood Culture adequate volume   Culture   Final    NO GROWTH < 24 HOURS Performed at [facility], [place] [address] [place], [place]  [address]    Report Status PENDING  Incomplete         Radiology Studies: Ct Abdomen Pelvis W Contrast  Result Date: 07/10/2018 CLINICAL DATA:  Left lower quadrant abdominal pain. Recent infection. On antibiotics. EXAM: CT ABDOMEN AND PELVIS WITH CONTRAST TECHNIQUE: Multidetector CT imaging of the abdomen and pelvis was performed using the standard protocol following bolus administration of intravenous contrast. CONTRAST:  114mL ISOVUE-300 IOPAMIDOL (ISOVUE-300) INJECTION 61% COMPARISON:  CT, 11/06/2013. FINDINGS: Lower chest: 6 mm nodule, subpleural right lower lobe, image 3, series 4. No acute findings. Hepatobiliary: No focal liver abnormality is seen. Status post cholecystectomy. No biliary dilatation. Pancreas: 13 mm low-density lesion in the pancreatic body. 7 mm cystic lesion along the anterior superior pancreatic head. No other pancreatic masses or lesions. No inflammation. Spleen: Normal in size without focal abnormality. Adrenals/Urinary Tract: No adrenal masses. Kidneys normal size,  orientation and position. Symmetric renal enhancement and excretion. 0.4 cm cyst, posterior mid to upper pole the left kidney. No other masses, no stones and no hydronephrosis. Normal ureters. Normal bladder. Stomach/Bowel: Mild hazy opacities noted in the fat adjacent to the proximal sigmoid colon, with there are multiple diverticula, consistent mild, uncomplicated diverticulitis. No other colonic inflammation. Stomach is unremarkable. Small bowel is normal in caliber with no wall thickening or inflammation. Vascular/Lymphatic: No enlarged lymph nodes. Aortic atherosclerosis. Reproductive: Unremarkable. Other: No abdominal wall hernia or abnormality. No abdominopelvic ascites. Musculoskeletal: No fracture or acute finding. No osteoblastic or osteolytic lesions. IMPRESSION: 1. Mild, uncomplicated diverticulitis of the proximal sigmoid colon. No extraluminal air. No abscess. 2. No other acute abnormality within the abdomen or pelvis. 3. 6 mm nodule, right lower lobe, not evident on previous CT scans. 4. Small low-attenuation, cystic appearing, lesions in the pancreas. The larger of these, in the pancreatic body, was present on the CT dated 02/03/2010, and is unchanged. The smaller lesion along the anterior superior pancreatic head appears new. Based on consensus guidelines, recommend follow-up CT in 2 years to reassess. Electronically Signed   By: Lajean Manes M.D.   On: 07/10/2018 13:58   Dg Chest Portable 1 View  Result Date: 07/10/2018 CLINICAL DATA:  Fever and elevated white blood cell count. History of cellulitis. EXAM: PORTABLE CHEST 1 VIEW COMPARISON:  06/29/2010 FINDINGS: The cardiac silhouette, mediastinal and hilar contours are within normal limits and stable. Mild tortuosity and calcification of the thoracic aorta. The lungs are clear. No pleural effusion. No worrisome pulmonary lesions. The bony thorax is intact. IMPRESSION: No acute cardiopulmonary findings. Electronically Signed   By: Marijo Sanes  M.D.   On: 07/10/2018 13:08        Scheduled Meds: . acetaminophen  1,000 mg Oral TID  . acidophilus  1 capsule Oral Daily  . cholecalciferol  400 Units Oral Daily  . gabapentin  300 mg Oral BID  . heparin  5,000 Units Subcutaneous Q8H  . insulin aspart  0-5 Units Subcutaneous QHS  . insulin aspart  0-9 Units Subcutaneous TID WC  . insulin glargine  45 Units Subcutaneous Daily  . loratadine  10 mg Oral Daily  . metoprolol tartrate  50 mg Oral BID  . tamsulosin  0.4 mg Oral QPM   Continuous Infusions: . sodium chloride    . ciprofloxacin 400 mg (07/11/18 0144)  . lactated ringers 1,000 mL (07/11/18 0805)  . metronidazole 500 mg (07/11/18 0531)     LOS: 1 day        Aline August, MD [facility] 07/11/2018,  10:25 AM

## 2018-07-11 NOTE — Progress Notes (Signed)
Patient BP low - 81/56; MD made aware. Will continue to monitor.

## 2018-07-12 LAB — COMPREHENSIVE METABOLIC PANEL
ALT: 19 U/L (ref 0–44)
AST: 21 U/L (ref 15–41)
Albumin: 2.3 g/dL — ABNORMAL LOW (ref 3.5–5.0)
Alkaline Phosphatase: 52 U/L (ref 38–126)
Anion gap: 7 (ref 5–15)
BUN: 13 mg/dL (ref 8–23)
CO2: 19 mmol/L — ABNORMAL LOW (ref 22–32)
Calcium: 7.5 mg/dL — ABNORMAL LOW (ref 8.9–10.3)
Chloride: 109 mmol/L (ref 98–111)
Creatinine, Ser: 1.16 mg/dL (ref 0.61–1.24)
GFR calc Af Amer: >60 mL/min (ref >60)
GFR calc non Af Amer: 56 mL/min — ABNORMAL LOW (ref >60)
Glucose, Bld: 100 mg/dL — ABNORMAL HIGH (ref 70–99)
Potassium: 3.9 mmol/L (ref 3.5–5.1)
Sodium: 135 mmol/L (ref 135–145)
Total Bilirubin: 0.5 mg/dL (ref 0.3–1.2)
Total Protein: 4.2 g/dL — ABNORMAL LOW (ref 6.5–8.1)

## 2018-07-12 LAB — NOVEL CORONAVIRUS, NAA (HOSP ORDER, SEND-OUT TO REF LAB; TAT 18-24 HRS): SARS-CoV-2, NAA: NOT DETECTED

## 2018-07-12 LAB — CBC WITH DIFFERENTIAL/PLATELET
Abs Immature Granulocytes: 0.08 10*3/uL — ABNORMAL HIGH (ref 0.00–0.07)
Basophils Absolute: 0 10*3/uL (ref 0.0–0.1)
Basophils Relative: 0 %
Eosinophils Absolute: 0.8 10*3/uL — ABNORMAL HIGH (ref 0.0–0.5)
Eosinophils Relative: 5 %
HCT: 28.9 % — ABNORMAL LOW (ref 39.0–52.0)
Hemoglobin: 9.5 g/dL — ABNORMAL LOW (ref 13.0–17.0)
Immature Granulocytes: 1 %
Lymphocytes Relative: 2 %
Lymphs Abs: 0.3 10*3/uL — ABNORMAL LOW (ref 0.7–4.0)
MCH: 30.1 pg (ref 26.0–34.0)
MCHC: 32.9 g/dL (ref 30.0–36.0)
MCV: 91.5 fL (ref 80.0–100.0)
Monocytes Absolute: 0.5 10*3/uL (ref 0.1–1.0)
Monocytes Relative: 3 %
Neutro Abs: 13 10*3/uL — ABNORMAL HIGH (ref 1.7–7.7)
Neutrophils Relative %: 89 %
Platelets: 244 10*3/uL (ref 150–400)
RBC: 3.16 MIL/uL — ABNORMAL LOW (ref 4.22–5.81)
RDW: 12.8 % (ref 11.5–15.5)
WBC: 14.7 10*3/uL — ABNORMAL HIGH (ref 4.0–10.5)
nRBC: 0 % (ref 0.0–0.2)

## 2018-07-12 LAB — VITAMIN B12: Vitamin B-12: 520 pg/mL (ref 180–914)

## 2018-07-12 LAB — FOLATE: Folate: 17.2 ng/mL (ref >5.9)

## 2018-07-12 LAB — MAGNESIUM: Magnesium: 2.1 mg/dL (ref 1.7–2.4)

## 2018-07-12 LAB — TSH: TSH: 0.841 u[IU]/mL (ref 0.350–4.500)

## 2018-07-12 LAB — GLUCOSE, CAPILLARY: Glucose-Capillary: 92 mg/dL (ref 70–99)

## 2018-07-12 MED ORDER — GABAPENTIN 300 MG PO CAPS: 300.0000 mg | ORAL_CAPSULE | Freq: Three times a day (TID) | ORAL | 0 refills | Status: DC

## 2018-07-12 MED ORDER — CIPROFLOXACIN HCL 500 MG PO TABS: 500.0000 mg | ORAL_TABLET | Freq: Two times a day (BID) | ORAL | 0 refills | Status: AC

## 2018-07-12 MED ORDER — METRONIDAZOLE 500 MG PO TABS: 500.0000 mg | ORAL_TABLET | Freq: Two times a day (BID) | ORAL | 0 refills | Status: AC

## 2018-07-12 MED ORDER — IPRATROPIUM-ALBUTEROL 0.5-2.5 (3) MG/3ML IN SOLN
3.0000 mL | Freq: Four times a day (QID) | RESPIRATORY_TRACT | Status: DC
  Administered 2018-07-12: 3 mL via RESPIRATORY_TRACT
  Filled 2018-07-12: qty 3

## 2018-07-12 MED ORDER — ONDANSETRON HCL 4 MG PO TABS: 4.0000 mg | ORAL_TABLET | Freq: Four times a day (QID) | ORAL | 0 refills | Status: DC | PRN

## 2018-07-12 MED ORDER — TAMSULOSIN HCL 0.4 MG PO CAPS: 0.4000 mg | ORAL_CAPSULE | Freq: Every evening | ORAL | Status: DC

## 2018-07-12 MED ORDER — IPRATROPIUM-ALBUTEROL 0.5-2.5 (3) MG/3ML IN SOLN: 3.0000 mL | Freq: Four times a day (QID) | RESPIRATORY_TRACT | Status: DC

## 2018-07-12 MED ORDER — TRAMADOL HCL 50 MG PO TABS: 50.0000 mg | ORAL_TABLET | Freq: Four times a day (QID) | ORAL | 0 refills | Status: DC | PRN

## 2018-07-12 NOTE — Progress Notes (Signed)
Gae Bon to be D/C' home per MD order.  Discussed with the patient and all questions fully answered.  VSS, Skin clean, dry and intact without evidence of skin break down, no evidence of skin tears noted. IV catheter discontinued intact. Site without signs and symptoms of complications. Dressing and pressure applied.  An After Visit Summary was printed and given to the patient. Patient received prescription.  D/c education completed with patient/family including follow up instructions, medication list, d/c activities limitations if indicated, with other d/c instructions as indicated by MD - patient able to verbalize understanding, all questions fully answered.   Patient instructed to return to ED, call 911, or call MD for any changes in condition.   Patient escorted via [place], and D/C home via private auto.   Lorenza Evangelist [facility] 07/12/2018 12:48 PM

## 2018-07-12 NOTE — Discharge Summary (Signed)
Physician Discharge Summary  CELESTINO ACKERMAN GEZ:[id number] DOB: [birth year] DOA: 07/10/2018  PCP: Gayland Curry, DO  Admit date: 07/10/2018 Discharge date: 07/12/2018  Admitted From: Home Disposition: Home  Recommendations for Outpatient Follow-up:  1. Follow up with PCP in 1 week with repeat CBC/BMP 2. Outpatient follow-up with neurology 3. Follow up in ED if symptoms worsen or new appear   Home Health: Home health PT Equipment/Devices: None  Discharge Condition: Stable CODE STATUS: Full Diet recommendation: Heart healthy  Brief/Interim Summary: 85 year old male with history of diabetes mellitus type 2, GERD, cervical spondylosis, hypertension, irritable bowel syndrome, neurogenic bladder, pressure cancer, previous diverticulitis, PSVT presented on 07/10/2018 with abdominal pain and fever.  CT of the abdomen pelvis showed mild uncomplicated diverticulitis of the proximal sigmoid colon.  He was started on IV antibiotics.  During the hospitalization, his symptoms improved.  He is tolerating diet.  Cultures have been negative so far.  He will be discharged home to complete antibiotic course.  Discharge Diagnoses:  Principal Problem:   Sepsis ([place]) Active Problems:   Hyperlipidemia   Essential hypertension   Paroxysmal supraventricular tachycardia (HCC)   DM (diabetes mellitus), type 2 with peripheral vascular complications ([place])   H/O prostate cancer   Acute diverticulitis  Sepsis with hypotension: Present on admission, most likely secondary to diverticulitis -Antibiotic plan as below.  Blood pressure still on the lower side.  Cultures negative so far.  -Sepsis has resolved. -We will keep antihypertensives on hold on discharge for now.    Acute sigmoid diverticulitis -Continue ciprofloxacin and Flagyl.  Abdominal pain is improving.    Tolerating diet. -No overnight vomiting.   -We will discharge him on 10 days of ciprofloxacin and Flagyl.    Leukocytosis -Probably from  above.  Improving.    Outpatient follow-up  Diabetes mellitus type 2 -Resume home regimen.  Outpatient follow-up  History of prostate cancer -Stable.  Outpatient follow-up  History of chronic neck pain/chronic low back pain/knee pain Bilateral upper extremity paresthesias -Patient has had extensive work-up for the same including MRI of the cervical spine last year with recent MRI of the lumbosacral spine had shown no significant canal stenosis or evidence of spinal cord compression.  He might need outpatient neurosurgery evaluation as well. -EMG nerve conduction study done as an outpatient had showed severe left carpal tunnel syndrome.  He follows up with neurology as outpatient.   -Pain is much better this morning.  Continue increased dose of gabapentin 300 mg 3 times a day.  Will use tramadol short-term as needed. Outpatient follow-up with neurology. -PT recommends home health PT.  Discharge Instructions  Discharge Instructions    Ambulatory referral to Neurology   Complete by: As directed    An appointment is requested in approximately: worsening pain/numbness   Diet - low sodium heart healthy   Complete by: As directed    Increase activity slowly   Complete by: As directed      Allergies as of 07/12/2018      Reactions   Aspirin Other (See Comments)   Bothers stomach, thins blood    Atorvastatin Other (See Comments)   Muscular pain and weakness   Prednisone Other (See Comments)   Upset stomach, can take by shot not by mouth   Simvastatin Other (See Comments)   Muscular pain and weakness   Sulfa Antibiotics Other (See Comments)   Upset stomach    Latex Rash   Metformin Hcl Nausea Only   Voltaren [diclofenac Sodium] Other (See  Comments)   Unknown      Medication List    STOP taking these medications   cephALEXin 500 MG capsule Commonly known as: KEFLEX   metoprolol tartrate 50 MG tablet Commonly known as: LOPRESSOR     TAKE these medications   acetaminophen  500 MG tablet Commonly known as: TYLENOL Take 1,000 mg by mouth 3 (three) times daily.   alum & mag hydroxide-simeth 200-200-20 MG/5ML suspension Commonly known as: MAALOX/MYLANTA Take 30 mLs by mouth as needed for indigestion or heartburn.   cholecalciferol 10 MCG (400 UNIT) Tabs tablet Commonly known as: VITAMIN D3 Take 400 Units by mouth daily.   ciprofloxacin 500 MG tablet Commonly known as: CIPRO Take 1 tablet (500 mg total) by mouth 2 (two) times daily for 10 days.   docusate sodium 100 MG capsule Commonly known as: COLACE Take 100 mg by mouth daily as needed.   famotidine 10 MG chewable tablet Commonly known as: PEPCID AC Chew 10 mg by mouth daily as needed for heartburn.   gabapentin 300 MG capsule Commonly known as: Neurontin Take 1 capsule (300 mg total) by mouth 3 (three) times daily. What changed: when to take this   Insulin Glargine 100 UNIT/ML Solostar Pen Commonly known as: Lantus SoloStar INJECT 45 UNITS  SUBCUTANEOUSLY IN THE  MORNING What changed:   how much to take  how to take this  when to take this  additional instructions   Insulin Pen Needle 31G X 8 MM Misc Commonly known as: B-D ULTRAFINE III SHORT PEN Use twice daily for insulin   loperamide 1 MG/5ML solution Commonly known as: IMODIUM Take 1 mg by mouth as needed for diarrhea or loose stools.   loratadine 10 MG tablet Commonly known as: CLARITIN Take 10 mg by mouth daily.   metroNIDAZOLE 500 MG tablet Commonly known as: FLAGYL Take 1 tablet (500 mg total) by mouth 2 (two) times daily for 10 days.   ondansetron 4 MG tablet Commonly known as: ZOFRAN Take 1 tablet (4 mg total) by mouth every 6 (six) hours as needed for nausea.   Probiotic-10 Chew Chew 1 each by mouth daily.   tamsulosin 0.4 MG Caps capsule Commonly known as: FLOMAX Take 1 capsule (0.4 mg total) by mouth every evening. What changed: See the new instructions.   traMADol 50 MG tablet Commonly known as:  ULTRAM Take 1 tablet (50 mg total) by mouth every 6 (six) hours as needed for moderate pain.   trolamine salicylate 10 % cream Commonly known as: ASPERCREME Apply 1 application topically as needed for muscle pain.      Follow-up Information    Reed, Tiffany L, DO. Schedule an appointment as soon as possible for a visit in 1 week(s).   Specialty: Geriatric Medicine Contact information: [address] [place] [address] [phone]        Marcial Pacas, MD. Schedule an appointment as soon as possible for a visit in 1 week(s).   Specialty: Neurology Contact information: [place] [address] [phone]          Allergies  Allergen Reactions  . Aspirin Other (See Comments)    Bothers stomach, thins blood   . Atorvastatin Other (See Comments)    Muscular pain and weakness  . Prednisone Other (See Comments)    Upset stomach, can take by shot not by mouth  . Simvastatin Other (See Comments)    Muscular pain and weakness  . Sulfa Antibiotics Other (  See Comments)    Upset stomach   . Latex Rash  . Metformin Hcl Nausea Only  . Voltaren [Diclofenac Sodium] Other (See Comments)    Unknown    Consultations:  None   Procedures/Studies: Dg Chest 2 View  Result Date: 06/29/2018 CLINICAL DATA:  85 year old male with fever. EXAM: CHEST - 2 VIEW COMPARISON:  Chest radiograph dated 02/25/2015 FINDINGS: The heart size and mediastinal contours are within normal limits. Both lungs are clear. The visualized skeletal structures are unremarkable. IMPRESSION: No active cardiopulmonary disease. Electronically Signed   By: Anner Crete M.D.   On: 06/29/2018 01:45   Dg Tibia/fibula Left  Result Date: 07/02/2018 CLINICAL DATA:  Laceration and puncture wound to the left lateral posterior leg. Diffuse swelling. EXAM: LEFT TIBIA AND FIBULA - 2 VIEW COMPARISON:  None. FINDINGS: Mild degenerative changes involving the knee and ankle joints. No acute bony findings  or destructive bony lesions. Diffuse soft tissue swelling is noted. Moderate vascular calcifications. IMPRESSION: No acute bony findings or destructive bony changes. Electronically Signed   By: Marijo Sanes M.D.   On: 07/02/2018 12:09   Mr Lumbar Spine W Wo Contrast  Result Date: 07/01/2018 CLINICAL DATA:  Fever, pain, bilateral hip pain and back pain EXAM: MRI LUMBAR SPINE WITHOUT AND WITH CONTRAST TECHNIQUE: Multiplanar and multiecho pulse sequences of the lumbar spine were obtained without and with intravenous contrast. CONTRAST:  8 mL Gadavist COMPARISON:  None. FINDINGS: Segmentation:  Standard. Alignment: 2 mm retrolisthesis L2 on L3. Minimal grade 1 anterolisthesis of L5 on S1. Vertebrae:  No fracture, evidence of discitis, or bone lesion. Conus medullaris and cauda equina: Conus extends to the L1 level. Conus and cauda equina appear normal. Paraspinal and other soft tissues: No acute paraspinal abnormality. Disc levels: Disc spaces: Degenerative disease with disc height loss at L2-3. Disc desiccation at L4-5 and L5-S1. left renal cyst. T12-L1: No significant disc bulge. No evidence of neural foraminal stenosis. No central canal stenosis. L1-L2: Minimal broad-based disc bulge. Moderate bilateral facet arthropathy. No evidence of neural foraminal stenosis. No central canal stenosis. L2-L3: Mild broad-based disc bulge. Moderate bilateral facet arthropathy. No evidence of neural foraminal stenosis. No central canal stenosis. L3-L4: Broad-based disc bulge. Moderate bilateral facet arthropathy. Bilateral lateral recess narrowing. No evidence of neural foraminal stenosis. No central canal stenosis. L4-L5: Broad-based disc bulge. Moderate bilateral facet arthropathy. Mild bilateral foraminal narrowing. No central canal stenosis. L5-S1: Minimal broad-based disc bulge. Moderate bilateral facet arthropathy. No evidence of neural foraminal stenosis. No central canal stenosis. IMPRESSION: 1.  No acute osseous injury  of the lumbar spine. 2. Lumbar spine spondylosis as described above. Electronically Signed   By: Kathreen Devoid   On: 07/01/2018 15:32   Ct Abdomen Pelvis W Contrast  Result Date: 07/10/2018 CLINICAL DATA:  Left lower quadrant abdominal pain. Recent infection. On antibiotics. EXAM: CT ABDOMEN AND PELVIS WITH CONTRAST TECHNIQUE: Multidetector CT imaging of the abdomen and pelvis was performed using the standard protocol following bolus administration of intravenous contrast. CONTRAST:  17mL ISOVUE-300 IOPAMIDOL (ISOVUE-300) INJECTION 61% COMPARISON:  CT, 11/06/2013. FINDINGS: Lower chest: 6 mm nodule, subpleural right lower lobe, image 3, series 4. No acute findings. Hepatobiliary: No focal liver abnormality is seen. Status post cholecystectomy. No biliary dilatation. Pancreas: 13 mm low-density lesion in the pancreatic body. 7 mm cystic lesion along the anterior superior pancreatic head. No other pancreatic masses or lesions. No inflammation. Spleen: Normal in size without focal abnormality. Adrenals/Urinary Tract: No adrenal masses. Kidneys normal size,  orientation and position. Symmetric renal enhancement and excretion. 0.4 cm cyst, posterior mid to upper pole the left kidney. No other masses, no stones and no hydronephrosis. Normal ureters. Normal bladder. Stomach/Bowel: Mild hazy opacities noted in the fat adjacent to the proximal sigmoid colon, with there are multiple diverticula, consistent mild, uncomplicated diverticulitis. No other colonic inflammation. Stomach is unremarkable. Small bowel is normal in caliber with no wall thickening or inflammation. Vascular/Lymphatic: No enlarged lymph nodes. Aortic atherosclerosis. Reproductive: Unremarkable. Other: No abdominal wall hernia or abnormality. No abdominopelvic ascites. Musculoskeletal: No fracture or acute finding. No osteoblastic or osteolytic lesions. IMPRESSION: 1. Mild, uncomplicated diverticulitis of the proximal sigmoid colon. No extraluminal air.  No abscess. 2. No other acute abnormality within the abdomen or pelvis. 3. 6 mm nodule, right lower lobe, not evident on previous CT scans. 4. Small low-attenuation, cystic appearing, lesions in the pancreas. The larger of these, in the pancreatic body, was present on the CT dated 02/03/2010, and is unchanged. The smaller lesion along the anterior superior pancreatic head appears new. Based on consensus guidelines, recommend follow-up CT in 2 years to reassess. Electronically Signed   By: Lajean Manes M.D.   On: 07/10/2018 13:58   Mr Sacrum Si Joints W Wo Contrast  Result Date: 07/01/2018 CLINICAL DATA:  Right hip pain, lower extremity weakness and fever. EXAM: MRI SACRUM WITHOUT AND WITH CONTRAST TECHNIQUE: Multiplanar, multisequence MR imaging of the sacrum was performed both before and after administration of intravenous contrast. CONTRAST:  8 cc Gadavist IV COMPARISON:  None. FINDINGS: Bones/Joint/Cartilage Marrow signal is normal without evidence of osteomyelitis, fracture or stress change. Sacroiliac joints are unremarkable. Ligaments Intact. Muscles and Tendons Intact.  No intramuscular fluid collection or edema. Soft tissues Subcutaneous edema and mild enhancement are seen in the soft tissues posterior to the sacrum suggestive of cellulitis. No abscess. Sacral nerve plexus appears normal. IMPRESSION: Findings suggestive of cellulitis in the subcutaneous fatty tissues posterior to the sacrum. Negative for abscess, septic joint or osteomyelitis. Electronically Signed   By: Inge Rise M.D.   On: 07/01/2018 15:34   Dg Chest Portable 1 View  Result Date: 07/10/2018 CLINICAL DATA:  Fever and elevated white blood cell count. History of cellulitis. EXAM: PORTABLE CHEST 1 VIEW COMPARISON:  06/29/2010 FINDINGS: The cardiac silhouette, mediastinal and hilar contours are within normal limits and stable. Mild tortuosity and calcification of the thoracic aorta. The lungs are clear. No pleural effusion. No  worrisome pulmonary lesions. The bony thorax is intact. IMPRESSION: No acute cardiopulmonary findings. Electronically Signed   By: Marijo Sanes M.D.   On: 07/10/2018 13:08   Dg Chest Port 1 View  Result Date: 06/29/2018 CLINICAL DATA:  Wheezing and fevers. EXAM: PORTABLE CHEST 1 VIEW COMPARISON:  06/29/2018 earlier. FINDINGS: Patient slightly rotated to the right. Lungs are adequately inflated without lobar consolidation or effusion. Cardiomediastinal silhouette and remainder of the exam is unchanged. IMPRESSION: No acute cardiopulmonary disease. Electronically Signed   By: Marin Olp M.D.   On: 06/29/2018 16:52   [place] Soft Tissue Non Vascular  Result Date: 06/29/2018 CLINICAL DATA:  85 year old male with history of puncture wound in the mid lateral left leg. High fevers. Evaluate for potential abscess. EXAM: ULTRASOUND LEFT LOWER EXTREMITY LIMITED TECHNIQUE: Ultrasound examination of the lower extremity soft tissues was performed in the area of clinical concern. COMPARISON:  NO PRIORS. FINDINGS: Joint Space: N/A. Muscles: Normal. Tendons: Normal Other Soft Tissue Structures: Normal. IMPRESSION: 1. No acute findings in  the area of concern. Specifically, no focal fluid collection to suggest abscess. Electronically Signed   By: Vinnie Langton M.D.   On: 06/29/2018 19:59       Subjective: Patient seen and examined at bedside.  He feels a little better.  His pain and left upper extremity numbness is improving.  He is tolerating diet.  He thinks he would be okay going home.  Discharge Exam: Vitals:   07/11/18 2356 07/12/18 0443  BP: (!) 85/41 (!) 91/46  Pulse: 95 87  Resp: 16 16  Temp: 100.2 F (37.9 C) 98.7 F (37.1 C)  SpO2: 90% 93%    General: Pt is alert, awake, not in acute distress.  Poor historian Cardiovascular: rate controlled, S1/S2 + Respiratory: bilateral decreased breath sounds at bases, some scattered crackles and wheezing Abdominal: Soft, NT, ND, bowel  sounds + Extremities: no edema, no cyanosis    The results of significant diagnostics from this hospitalization (including imaging, microbiology, ancillary and laboratory) are listed below for reference.     Microbiology: Recent Results (from the past 240 hour(s))  Blood culture (routine x 2)     Status: None (Preliminary result)   Collection Time: 07/10/18 11:50 AM   Specimen: BLOOD  Result Value Ref Range Status   Specimen Description BLOOD LEFT ANTECUBITAL  Final   Special Requests   Final    BOTTLES DRAWN AEROBIC AND ANAEROBIC Blood Culture adequate volume   Culture   Final    NO GROWTH < 24 HOURS Performed at [facility], [address], [place], [place] [address]    Report Status PENDING  Incomplete  Blood culture (routine x 2)     Status: None (Preliminary result)   Collection Time: 07/10/18 12:00 PM   Specimen: BLOOD RIGHT FOREARM  Result Value Ref Range Status   Specimen Description BLOOD RIGHT FOREARM  Final   Special Requests   Final    BOTTLES DRAWN AEROBIC AND ANAEROBIC Blood Culture adequate volume   Culture   Final    NO GROWTH < 24 HOURS Performed at [facility], [place] [address], [place], [place] [address]    Report Status PENDING  Incomplete     Labs: BNP (last 3 results) No results for input(s): BNP in the last 8760 hours. Basic Metabolic Panel: Recent Labs  Lab 07/08/18 1625 07/10/18 1130 07/10/18 2149 07/11/18 0259 07/12/18 0353  NA 134* 134*  --  137 135  K 5.3 3.9  --  3.6 3.9  CL 100 103  --  108 109  CO2 23 22  --  20* 19*  GLUCOSE 189* 188*  --  119* 100*  BUN 19 14  --  16 13  CREATININE 1.16* 0.94 1.35* 1.29* 1.16  CALCIUM 9.6 8.9  --  7.8* 7.5*  MG  --   --   --   --  2.1   Liver Function Tests: Recent Labs  Lab 07/08/18 1625 07/10/18 1130 07/11/18 0259 07/12/18 0353  AST 14 22 29 21   ALT 25 23 23 19   ALKPHOS  --  75 57 52  BILITOT 0.5 0.7 0.6 0.5  PROT 6.1 6.2* 4.4* 4.2*  ALBUMIN  --  3.3* 2.4* 2.3*   No  results for input(s): LIPASE, AMYLASE in the last 168 hours. No results for input(s): AMMONIA in the last 168 hours. CBC: Recent Labs  Lab 07/08/18 1625 07/10/18 1130 07/10/18 2149 07/11/18 0259 07/12/18 0353  WBC 21.3* 9.5 19.8* 17.1* 14.7*  NEUTROABS 18,169* 7.0  --   --  13.0*  HGB 13.0* 12.2* 11.4* 10.9* 9.5*  HCT 38.4* 37.1* 34.0* 33.5* 28.9*  MCV 89.3 92.5 90.7 92.0 91.5  PLT 339 295 267 253 244   Cardiac Enzymes: No results for input(s): CKTOTAL, CKMB, CKMBINDEX, TROPONINI in the last 168 hours. BNP: Invalid input(s): POCBNP CBG: Recent Labs  Lab 07/11/18 0754 07/11/18 1216 07/11/18 1706 07/11/18 2109 07/12/18 0750  GLUCAP 71 138* 138* 94 92   D-Dimer No results for input(s): DDIMER in the last 72 hours. Hgb A1c No results for input(s): HGBA1C in the last 72 hours. Lipid Profile No results for input(s): CHOL, HDL, LDLCALC, TRIG, CHOLHDL, LDLDIRECT in the last 72 hours. Thyroid function studies Recent Labs    07/12/18 0353  TSH 0.841   Anemia work up Recent Labs    07/12/18 0353  VITAMINB12 520  FOLATE 17.2   Urinalysis    Component Value Date/Time   COLORURINE STRAW (A) 07/10/2018 1340   APPEARANCEUR CLEAR 07/10/2018 1340   LABSPEC 1.004 (L) 07/10/2018 1340   PHURINE 6.0 07/10/2018 1340   GLUCOSEU NEGATIVE 07/10/2018 1340   HGBUR NEGATIVE 07/10/2018 1340   [place] 07/10/2018 1340   KETONESUR NEGATIVE 07/10/2018 1340   PROTEINUR NEGATIVE 07/10/2018 1340   UROBILINOGEN 0.2 11/06/2013 1621   NITRITE NEGATIVE 07/10/2018 1340   LEUKOCYTESUR NEGATIVE 07/10/2018 1340   Sepsis Labs Invalid input(s): PROCALCITONIN,  WBC,  LACTICIDVEN Microbiology Recent Results (from the past 240 hour(s))  Blood culture (routine x 2)     Status: None (Preliminary result)   Collection Time: 07/10/18 11:50 AM   Specimen: BLOOD  Result Value Ref Range Status   Specimen Description BLOOD LEFT ANTECUBITAL  Final   Special Requests   Final    BOTTLES  DRAWN AEROBIC AND ANAEROBIC Blood Culture adequate volume   Culture   Final    NO GROWTH < 24 HOURS Performed at [facility], [place] [address], [place], [place] [address]    Report Status PENDING  Incomplete  Blood culture (routine x 2)     Status: None (Preliminary result)   Collection Time: 07/10/18 12:00 PM   Specimen: BLOOD RIGHT FOREARM  Result Value Ref Range Status   Specimen Description BLOOD RIGHT FOREARM  Final   Special Requests   Final    BOTTLES DRAWN AEROBIC AND ANAEROBIC Blood Culture adequate volume   Culture   Final    NO GROWTH < 24 HOURS Performed at [facility], [place] [address] [place], [place] [address]    Report Status PENDING  Incomplete     Time coordinating discharge: 35 minutes  SIGNED:   Aline August, MD  [facility] 07/12/2018, 9:39 AM

## 2018-07-12 NOTE — TOC Transition Note (Signed)
Transition of Care [facility]) - CM/SW Discharge Note   Patient Details  Name: Micheal [name] MRN: [id number] Date of Birth: 03-Jul-1928  [facility] CM/SW Contact:  Carles Collet, RN Phone Number: 07/12/2018, 10:32 AM   Clinical Narrative:   Patient to return home at DC, will have [facility] resume [place] services    Final next level of care: [place] Barriers to Discharge: No Barriers Identified   Patient Goals and CMS Choice Patient states their goals for this hospitalization and ongoing recovery are:: to return home   Choice offered to / list presented to : NA  Discharge Placement                       Discharge Plan and Services                          HH Arranged: PT [facility] Agency: [place] ([place]) Date [facility] Agency Contacted: 07/12/18 Time [place]: [place] Representative spoke with at [place]: [place] ([place]) Interventions     Readmission Risk Interventions No flowsheet data found.

## 2018-07-14 DIAGNOSIS — S81832D Puncture wound without foreign body, left lower leg, subsequent encounter: Secondary | ICD-10-CM | POA: Diagnosis not present

## 2018-07-14 DIAGNOSIS — A419 Sepsis, unspecified organism: Secondary | ICD-10-CM | POA: Diagnosis not present

## 2018-07-14 DIAGNOSIS — E1151 Type 2 diabetes mellitus with diabetic peripheral angiopathy without gangrene: Secondary | ICD-10-CM | POA: Diagnosis not present

## 2018-07-14 DIAGNOSIS — E1140 Type 2 diabetes mellitus with diabetic neuropathy, unspecified: Secondary | ICD-10-CM | POA: Diagnosis not present

## 2018-07-14 DIAGNOSIS — L03116 Cellulitis of left lower limb: Secondary | ICD-10-CM | POA: Diagnosis not present

## 2018-07-14 DIAGNOSIS — I10 Essential (primary) hypertension: Secondary | ICD-10-CM | POA: Diagnosis not present

## 2018-07-15 ENCOUNTER — Telehealth: Payer: Self-pay | Admitting: *Deleted

## 2018-07-15 LAB — CULTURE, BLOOD (ROUTINE X 2)
Culture: NO GROWTH
Culture: NO GROWTH
Special Requests: ADEQUATE
Special Requests: ADEQUATE

## 2018-07-15 NOTE — Telephone Encounter (Signed)
Transition Care Management Follow-up Telephone Call  Date of discharge and from where: 07/12/2018 from [facility]  How have you been since you were released from the hospital? good  Any questions or concerns? No  just confirming appointment  Items Reviewed:  Did the pt receive and understand the discharge instructions provided? Yes   Medications obtained and verified? Yes   Any new allergies since your discharge? No   Dietary orders reviewed? Yes  Do you have support at home? Yes   Other (ie: DME, Home Health, etc) Therapist has came to home  Functional Questionnaire: (I = Independent and D = Dependent) ADL's: I with assistance  Bathing/Dressing- I with assistance   Meal Prep- D wife  Eating- I  Maintaining continence- I  Transferring/Ambulation- I with assistance  Managing Meds- I with assistance   Follow up appointments reviewed:    PCP Hospital f/u appt confirmed? Yes  Scheduled to see Dinah on 07/16/2018 @ 9:30.  [facility] f/u appt confirmed? Yes   Are transportation arrangements needed? No   If their condition worsens, is the pt aware to call  their PCP or go to the ED? Yes  Was the patient provided with contact information for the PCP's office or ED? Yes  Was the pt encouraged to call back with questions or concerns? Yes

## 2018-07-15 NOTE — Telephone Encounter (Signed)
I have made the 1st attempt to contact the patient or family member in charge, in order to follow up from recently being discharged from the hospital. I left a message on voicemail but I will make another attempt at a different time.  

## 2018-07-16 ENCOUNTER — Other Ambulatory Visit: Payer: Self-pay

## 2018-07-16 ENCOUNTER — Encounter: Payer: Self-pay | Admitting: Family

## 2018-07-16 ENCOUNTER — Ambulatory Visit (INDEPENDENT_AMBULATORY_CARE_PROVIDER_SITE_OTHER): Payer: Medicare Other | Admitting: Family

## 2018-07-16 ENCOUNTER — Telehealth: Payer: Self-pay | Admitting: Family

## 2018-07-16 VITALS — BP 118/60 | HR 98 | Temp 97.9°F | Ht 67.0 in | Wt 162.0 lb

## 2018-07-16 DIAGNOSIS — M545 Low back pain, unspecified: Secondary | ICD-10-CM

## 2018-07-16 DIAGNOSIS — G8929 Other chronic pain: Secondary | ICD-10-CM

## 2018-07-16 DIAGNOSIS — M25561 Pain in right knee: Secondary | ICD-10-CM | POA: Diagnosis not present

## 2018-07-16 DIAGNOSIS — E876 Hypokalemia: Secondary | ICD-10-CM

## 2018-07-16 DIAGNOSIS — H6123 Impacted cerumen, bilateral: Secondary | ICD-10-CM | POA: Diagnosis not present

## 2018-07-16 DIAGNOSIS — R202 Paresthesia of skin: Secondary | ICD-10-CM

## 2018-07-16 DIAGNOSIS — M542 Cervicalgia: Secondary | ICD-10-CM

## 2018-07-16 DIAGNOSIS — D696 Thrombocytopenia, unspecified: Secondary | ICD-10-CM | POA: Diagnosis not present

## 2018-07-16 DIAGNOSIS — K5792 Diverticulitis of intestine, part unspecified, without perforation or abscess without bleeding: Secondary | ICD-10-CM | POA: Diagnosis not present

## 2018-07-16 DIAGNOSIS — I10 Essential (primary) hypertension: Secondary | ICD-10-CM | POA: Diagnosis not present

## 2018-07-16 DIAGNOSIS — R911 Solitary pulmonary nodule: Secondary | ICD-10-CM

## 2018-07-16 DIAGNOSIS — E1151 Type 2 diabetes mellitus with diabetic peripheral angiopathy without gangrene: Secondary | ICD-10-CM | POA: Diagnosis not present

## 2018-07-16 DIAGNOSIS — M25562 Pain in left knee: Secondary | ICD-10-CM

## 2018-07-16 MED ORDER — DEBROX 6.5 % OT SOLN: 5.0000 [drp] | Freq: Two times a day (BID) | OTIC | 0 refills | Status: AC

## 2018-07-16 NOTE — Progress Notes (Signed)
Provider: Brisha Mccabe FNP-C   Gayland Curry, DO  Patient Care Team: Gayland Curry, DO as PCP - General (Geriatric Medicine) Netta Cedars, MD as Consulting Physician (Orthopedic Surgery) Jasmine Awe, MD as Referring Physician (Ophthalmology) Rana Snare, MD as Consulting Physician (Urology)  Extended Emergency Contact Information Primary Emergency Contact: Arna Snipe [place] Phone: [phone] Work Phone: [phone] Mobile Phone: [phone] Relation: Daughter Secondary Emergency Contact: Pehl,Evelyn Address: [place], [place] Phone: [phone] Relation: Spouse  Code Status: Full Code  Goals of care: Advanced Directive information Advanced Directives 07/10/2018  Does Patient Have a Medical Advance Directive? Yes  Type of Advance Directive [place]  Does patient want to make changes to medical advance directive? No - Patient declined  Copy of [place] in Chart? -  Would patient like information on creating a medical advance directive? -     Chief Complaint  Patient presents with   Transitions Of Care    discharged 07/12/2018    HPI:  Pt is a 85 y.o. male seen today for transition of care post hospital admission from 07/10/2018-6/20/202 with abdominal pain and fever.He had CT scan of the abdomen and pelvis that showed mild uncomplicated diverticulitis of the proximal sigmoid colon.WBC 19.8 (07/10/2018);17.1(07/11/2018); 14.7 (07/12/2018). He was treated with I.V antibiotics with much improvement.Blood cultures were negative.He tolerated his diet well.He was discharged on Cipro and Flagyl x 10 days.He states still taking antibiotics.His Humulin 70/30 injection 23 units QHS was discontinued during hospital admission.Chest CT scan showed 6 mm right sided pulmonary nodule noted.Follow up CT scan recommended in 3 months.He  denies any cough,wheezing or shortness of breath..He was also discharge with Home health PT/OT and rolling walker ordered.He states Therapy will be starting next week.COVID- 19 test was negative.   of note, he was previous in the ED 06/28/2018 with left leg puncture wound sustained by arrested rake.He was treated with I.V vancomycin and cefepime.X-ray of the left leg showed no signs of Osteomyelitis.He was discharged on Keflex 500 mg tablet four times daily x 5 days.He was follow up on outpatient by infectious Disease 07/10/2018.Repeat WBC were still elevated and was febrile Temp 101 Patient was advised to go to the ED.    Chronic neck/back pain - MRI lumbar spine 07/01/2018 showed L5-S1,L4-L5 broad-based disc bulge.with mild foramina narrowing.No fracture,stenosis or cord compression noted.MRI sacrum suggestive of cellulitis in the subcutaneous fatty tissues posterior to the sacrum but negative for abscess,septic joint or osteomyelitis.Outpatient follow up with Neuro recommended.    Chronic bilateral knee pain - hx of Osteoarthritis.follows up with Orthopedic states not a candidate for knee replacement due to his advance age and Type 2 DM.He receives cortisol injections periodically though states not effective for left knee. He will contact orthopedic office for follow.Takes Tylenol as needed,Aspercreme as needed Tramadol 50 mg tablet every 6 hours as needed.Home health PT.   Upper extremities parastheia - worsening weakness,numbness and tingling on bilateral hands.EMG nerve conduction study done outpatient showed left carpal tunnel syndrome follows up with Neurology.Outpatient follow up with Neurosurgery recommended during hospitalization for neck/back pain.on Gabapentin 300 mg tablet three times daily. Mg level,Vit B12 and Folate within normal.    Hx of Prostate cancer - stable.continues to follow up with Urology.   Thrombocytopenia - Plts 134 (07/01/2018); 295 (07/10/2018); 244 (07/12/2018).he denies  any  signs of bleeding.   Hypokalemia - K+ 3.3 replete with K-dur 20 meq Tablet x 1   Type 2 DM - Humulin 70/30 injection 23 units QHS was discontinued during hospital admission.On Lantus 45 units SQ daily.His CBG log reviewed readings in the 80's-160's. No signs of hypo/hypergylcemia reported.Appetite has improved but not quite well. Hospital medication reconciliation completed.  Past Medical History:  Diagnosis Date   Allergic rhinitis, cause unspecified    Carpal tunnel syndrome    Cervical spondylosis without myelopathy    Cervical spondylosis without myelopathy    Cervicalgia    Diverticulosis of colon (without mention of hemorrhage)    Esophageal reflux    Hypertrophy of prostate with urinary obstruction and other lower urinary tract symptoms (LUTS)    Insomnia, unspecified    Internal hemorrhoids without mention of complication    Irritable bowel syndrome    Macular degeneration (senile) of retina, unspecified    Malignant neoplasm of prostate (HCC)    Neurogenic bladder, NOS    Neuropathy    Nonspecific (abnormal) findings on radiological and other examination of gastrointestinal tract    Orthostatic hypotension    Osteoarthrosis, unspecified whether generalized or localized, unspecified site    Other and unspecified hyperlipidemia    Other malaise and fatigue    Other specified disorder of rectum and anus    Radiation Proctitus   Paroxysmal supraventricular tachycardia (HCC)    Restless legs syndrome (RLS)    Retinal detachment with retinal defect, unspecified    Right bundle branch block    Incomplete   Spermatocele    Right   Trigger finger (acquired)    Type II or unspecified type diabetes mellitus without mention of complication, not stated as uncontrolled    Type II or unspecified type diabetes mellitus without mention of complication, uncontrolled    Unspecified essential hypertension    patient denies, states he has a "fast heart  rate, but no high BP"   Vitamin D deficiency    Past Surgical History:  Procedure Laterality Date   Angiolipoma  1980   Right arm   APPENDECTOMY  1951   CHOLECYSTECTOMY  01/1994   COLONOSCOPY  04/11/2010   COLONOSCOPY W/ POLYPECTOMY  2005   Removed 2 (two) polyps   DUPUYTREN CONTRACTURE RELEASE Bilateral 1989   EYE SURGERY  2006   Left   EYE SURGERY  2006   Retina reattachment, right   EYE SURGERY  02/1994   Cataract surgery, right   EYE SURGERY  01/1995   Cataract surgery, left   KNEE SURGERY  1979   PARATHYROIDECTOMY Left 12/27/2016   Procedure: LEFT INFERIOR PARATHYROIDECTOMY;  Surgeon: Armandina Gemma, MD;  Location: [facility];  Service: General;  Laterality: Left;   POLYPECTOMY  1955   Vocal cord   RETINAL DETACHMENT SURGERY Right 2005   SHOULDER ARTHROSCOPY Right 03/22/15   [facility]   TENDON RELEASE  01/1997   seven fingers   TRANSURETHRAL RESECTION OF PROSTATE  08/1989   VITRECTOMY Left 04/23/2013   [facility]    Allergies  Allergen Reactions   Aspirin Other (See Comments)    Bothers stomach, thins blood    Atorvastatin Other (See Comments)    Muscular pain and weakness   Prednisone Other (See Comments)    Upset stomach, can take by shot not by mouth   Simvastatin Other (See Comments)    Muscular pain and weakness   Sulfa Antibiotics Other (See Comments)    Upset stomach  Latex Rash   Metformin Hcl Nausea Only   Voltaren [Diclofenac Sodium] Other (See Comments)    Unknown    Allergies as of 07/16/2018      Reactions   Aspirin Other (See Comments)   Bothers stomach, thins blood    Atorvastatin Other (See Comments)   Muscular pain and weakness   Prednisone Other (See Comments)   Upset stomach, can take by shot not by mouth   Simvastatin Other (See Comments)   Muscular pain and weakness   Sulfa Antibiotics Other (See Comments)   Upset stomach    Latex Rash   Metformin Hcl Nausea Only   Voltaren [diclofenac Sodium] Other (See  Comments)   Unknown      Medication List       Accurate as of July 16, 2018  9:36 AM. If you have any questions, ask your nurse or doctor.        acetaminophen 500 MG tablet Commonly known as: TYLENOL Take 1,000 mg by mouth 3 (three) times daily.   alum & mag hydroxide-simeth 200-200-20 MG/5ML suspension Commonly known as: MAALOX/MYLANTA Take 30 mLs by mouth as needed for indigestion or heartburn.   cholecalciferol 10 MCG (400 UNIT) Tabs tablet Commonly known as: VITAMIN D3 Take 400 Units by mouth daily.   ciprofloxacin 500 MG tablet Commonly known as: CIPRO Take 1 tablet (500 mg total) by mouth 2 (two) times daily for 10 days.   docusate sodium 100 MG capsule Commonly known as: COLACE Take 100 mg by mouth daily as needed.   famotidine 10 MG chewable tablet Commonly known as: PEPCID AC Chew 10 mg by mouth daily as needed for heartburn.   gabapentin 300 MG capsule Commonly known as: Neurontin Take 1 capsule (300 mg total) by mouth 3 (three) times daily.   Insulin Glargine 100 UNIT/ML Solostar Pen Commonly known as: Lantus SoloStar INJECT 45 UNITS  SUBCUTANEOUSLY IN THE  MORNING   Insulin Pen Needle 31G X 8 MM Misc Commonly known as: B-D ULTRAFINE III SHORT PEN Use twice daily for insulin   loperamide 1 MG/5ML solution Commonly known as: IMODIUM Take 1 mg by mouth as needed for diarrhea or loose stools.   loratadine 10 MG tablet Commonly known as: CLARITIN Take 10 mg by mouth daily.   metroNIDAZOLE 500 MG tablet Commonly known as: FLAGYL Take 1 tablet (500 mg total) by mouth 2 (two) times daily for 10 days.   ondansetron 4 MG tablet Commonly known as: ZOFRAN Take 1 tablet (4 mg total) by mouth every 6 (six) hours as needed for nausea.   Probiotic-10 Chew Chew 1 each by mouth daily.   tamsulosin 0.4 MG Caps capsule Commonly known as: FLOMAX Take 1 capsule (0.4 mg total) by mouth every evening.   traMADol 50 MG tablet Commonly known as: ULTRAM Take  1 tablet (50 mg total) by mouth every 6 (six) hours as needed for moderate pain.   trolamine salicylate 10 % cream Commonly known as: ASPERCREME Apply 1 application topically as needed for muscle pain.       Review of Systems  Constitutional: Positive for appetite change. Negative for chills, fatigue, fever and unexpected weight change.  HENT: Positive for hearing loss. Negative for congestion, postnasal drip, rhinorrhea, sinus pressure, sinus pain, sneezing, sore throat and trouble swallowing.        Wears left ear hearing aid  Eyes: Positive for visual disturbance. Negative for discharge, redness and itching.       Macular degeneration  wears eye glasses and follows up with Ophthalmology   Respiratory: Negative for cough, chest tightness, shortness of breath and wheezing.        6 mm pulmonary nodule noted on x-ray during hospital admission.CT scan in 3 months recommended.   Cardiovascular: Positive for leg swelling. Negative for chest pain and palpitations.  Gastrointestinal: Negative for abdominal distention, abdominal pain, constipation, diarrhea, nausea and vomiting.  Endocrine: Negative for cold intolerance, heat intolerance, polydipsia, polyphagia and polyuria.  Genitourinary: Negative for difficulty urinating, dysuria and flank pain.       On Flomax   Musculoskeletal: Positive for arthralgias, back pain, gait problem and neck pain. Negative for neck stiffness.  Skin: Negative for color change, pallor and rash.       Left leg wound has healed.  Neurological: Negative for dizziness, tremors, speech difficulty, light-headedness and headaches.       Worsening numbness,tingling and weakness of both hands.   Hematological: Does not bruise/bleed easily.  Psychiatric/Behavioral: Negative for agitation, confusion and sleep disturbance. The patient is not nervous/anxious.     Immunization History  Administered Date(s) Administered   Influenza, High Dose Seasonal PF 10/19/2016,  11/01/2017   Influenza,inj,Quad PF,6+ Mos 10/29/2012, 10/28/2013   Influenza-Unspecified 10/25/2010, 11/07/2011, 10/07/2015   Pneumococcal Conjugate-13 05/18/2014   Pneumococcal Polysaccharide-23 01/23/2007   Td 08/23/2010   Tdap 06/29/2018   Pertinent  Health Maintenance Due  Topic Date Due   INFLUENZA VACCINE  08/23/2018   OPHTHALMOLOGY EXAM  08/27/2018   HEMOGLOBIN A1C  10/04/2018   URINE MICROALBUMIN  12/31/2018   FOOT EXAM  04/03/2019   PNA vac Low Risk Adult  Completed   Fall Risk  07/16/2018 06/19/2018 04/03/2018 03/07/2018 12/30/2017  Falls in the past year? 0 0 0 0 0  Number falls in past yr: 0 0 0 0 0  Injury with Fall? 0 0 0 0 0  Risk for fall due to : - - - - -    Vitals:   07/16/18 0929  BP: 118/60  Pulse: 98  Temp: 97.9 F (36.6 C)  TempSrc: Oral  SpO2: 93%  Weight: 162 lb (73.5 kg)  Height: 5' 7"  (1.702 m)   Body mass index is 25.37 kg/m. Physical Exam Vitals signs reviewed.  Constitutional:      General: He is not in acute distress.    Appearance: He is normal weight. He is not ill-appearing.  HENT:     Head: Normocephalic.     Right Ear: There is impacted cerumen.     Left Ear: There is impacted cerumen.     Nose: No congestion or rhinorrhea.     Mouth/Throat:     Mouth: Mucous membranes are moist.     Pharynx: Oropharynx is clear. No oropharyngeal exudate or posterior oropharyngeal erythema.  Eyes:     General: No scleral icterus.       Right eye: No discharge.        Left eye: No discharge.     Conjunctiva/sclera: Conjunctivae normal.     Pupils: Pupils are equal, round, and reactive to light.     Comments: Corrective lens in place   Neck:     Musculoskeletal: Normal range of motion. No neck rigidity or muscular tenderness.     Vascular: No carotid bruit.  Cardiovascular:     Rate and Rhythm: Normal rate and regular rhythm.     Pulses: Normal pulses.     Heart sounds: Normal heart sounds. No murmur. No friction rub. No  gallop.     Pulmonary:     Effort: Pulmonary effort is normal. No respiratory distress.     Breath sounds: Normal breath sounds. No wheezing, rhonchi or rales.  Chest:     Chest wall: No tenderness.  Abdominal:     General: Bowel sounds are normal. There is no distension.     Palpations: Abdomen is soft. There is no mass.     Tenderness: There is no abdominal tenderness. There is no right CVA tenderness, left CVA tenderness, guarding or rebound.  Musculoskeletal:        General: No swelling or tenderness.     Comments: Moves x 4 extremities.bilateral UE decreased strength Grip 3-4/5.unstead gait walks with Front wheel walker. Bilateral lower extremities trace edema.   Lymphadenopathy:     Cervical: No cervical adenopathy.  Skin:    General: Skin is warm and dry.     Coloration: Skin is not pale.     Findings: No bruising, erythema or rash.     Comments: Left lower leg previous wound site small scab noted.surrounding skin tissues without any redness,swelling or tenderness.   Neurological:     Mental Status: He is alert and oriented to person, place, and time.     Cranial Nerves: No cranial nerve deficit.     Motor: Weakness present. No tremor.     Coordination: Coordination normal. Finger-Nose-Finger Test normal.     Gait: Gait abnormal.     Comments: Bilateral hand grip decreased strength HOH bilateral   Psychiatric:        Mood and Affect: Mood normal.        Behavior: Behavior normal.        Thought Content: Thought content normal.        Judgment: Judgment normal.    Labs reviewed: Recent Labs    07/10/18 1130 07/10/18 2149 07/11/18 0259 07/12/18 0353  NA 134*  --  137 135  K 3.9  --  3.6 3.9  CL 103  --  108 109  CO2 22  --  20* 19*  GLUCOSE 188*  --  119* 100*  BUN 14  --  16 13  CREATININE 0.94 1.35* 1.29* 1.16  CALCIUM 8.9  --  7.8* 7.5*  MG  --   --   --  2.1   Recent Labs    07/10/18 1130 07/11/18 0259 07/12/18 0353  AST 22 29 21   ALT 23 23 19   ALKPHOS 75 57  52  BILITOT 0.7 0.6 0.5  PROT 6.2* 4.4* 4.2*  ALBUMIN 3.3* 2.4* 2.3*   Recent Labs    07/08/18 1625 07/10/18 1130 07/10/18 2149 07/11/18 0259 07/12/18 0353  WBC 21.3* 9.5 19.8* 17.1* 14.7*  NEUTROABS 18,169* 7.0  --   --  13.0*  HGB 13.0* 12.2* 11.4* 10.9* 9.5*  HCT 38.4* 37.1* 34.0* 33.5* 28.9*  MCV 89.3 92.5 90.7 92.0 91.5  PLT 339 295 267 253 244   Lab Results  Component Value Date   TSH 0.841 07/12/2018   Lab Results  Component Value Date   HGBA1C 7.8 (H) 04/03/2018   Lab Results  Component Value Date   CHOL 251 (H) 08/15/2017   HDL 38 (L) 08/15/2017   LDLCALC 171 (H) 08/15/2017   TRIG 256 (H) 08/15/2017   CHOLHDL 6.6 (H) 08/15/2017    Significant Diagnostic Results in last 30 days:  Dg Chest 2 View  Result Date: 06/29/2018 CLINICAL DATA:  85 year old male with fever. EXAM: CHEST - 2  VIEW COMPARISON:  Chest radiograph dated 02/25/2015 FINDINGS: The heart size and mediastinal contours are within normal limits. Both lungs are clear. The visualized skeletal structures are unremarkable. IMPRESSION: No active cardiopulmonary disease. Electronically Signed   By: Anner Crete M.D.   On: 06/29/2018 01:45   Dg Tibia/fibula Left  Result Date: 07/02/2018 CLINICAL DATA:  Laceration and puncture wound to the left lateral posterior leg. Diffuse swelling. EXAM: LEFT TIBIA AND FIBULA - 2 VIEW COMPARISON:  None. FINDINGS: Mild degenerative changes involving the knee and ankle joints. No acute bony findings or destructive bony lesions. Diffuse soft tissue swelling is noted. Moderate vascular calcifications. IMPRESSION: No acute bony findings or destructive bony changes. Electronically Signed   By: Marijo Sanes M.D.   On: 07/02/2018 12:09   Mr Lumbar Spine W Wo Contrast  Result Date: 07/01/2018 CLINICAL DATA:  Fever, pain, bilateral hip pain and back pain EXAM: MRI LUMBAR SPINE WITHOUT AND WITH CONTRAST TECHNIQUE: Multiplanar and multiecho pulse sequences of the lumbar spine were  obtained without and with intravenous contrast. CONTRAST:  8 mL Gadavist COMPARISON:  None. FINDINGS: Segmentation:  Standard. Alignment: 2 mm retrolisthesis L2 on L3. Minimal grade 1 anterolisthesis of L5 on S1. Vertebrae:  No fracture, evidence of discitis, or bone lesion. Conus medullaris and cauda equina: Conus extends to the L1 level. Conus and cauda equina appear normal. Paraspinal and other soft tissues: No acute paraspinal abnormality. Disc levels: Disc spaces: Degenerative disease with disc height loss at L2-3. Disc desiccation at L4-5 and L5-S1. left renal cyst. T12-L1: No significant disc bulge. No evidence of neural foraminal stenosis. No central canal stenosis. L1-L2: Minimal broad-based disc bulge. Moderate bilateral facet arthropathy. No evidence of neural foraminal stenosis. No central canal stenosis. L2-L3: Mild broad-based disc bulge. Moderate bilateral facet arthropathy. No evidence of neural foraminal stenosis. No central canal stenosis. L3-L4: Broad-based disc bulge. Moderate bilateral facet arthropathy. Bilateral lateral recess narrowing. No evidence of neural foraminal stenosis. No central canal stenosis. L4-L5: Broad-based disc bulge. Moderate bilateral facet arthropathy. Mild bilateral foraminal narrowing. No central canal stenosis. L5-S1: Minimal broad-based disc bulge. Moderate bilateral facet arthropathy. No evidence of neural foraminal stenosis. No central canal stenosis. IMPRESSION: 1.  No acute osseous injury of the lumbar spine. 2. Lumbar spine spondylosis as described above. Electronically Signed   By: Kathreen Devoid   On: 07/01/2018 15:32   Ct Abdomen Pelvis W Contrast  Result Date: 07/10/2018 CLINICAL DATA:  Left lower quadrant abdominal pain. Recent infection. On antibiotics. EXAM: CT ABDOMEN AND PELVIS WITH CONTRAST TECHNIQUE: Multidetector CT imaging of the abdomen and pelvis was performed using the standard protocol following bolus administration of intravenous contrast.  CONTRAST:  152m ISOVUE-300 IOPAMIDOL (ISOVUE-300) INJECTION 61% COMPARISON:  CT, 11/06/2013. FINDINGS: Lower chest: 6 mm nodule, subpleural right lower lobe, image 3, series 4. No acute findings. Hepatobiliary: No focal liver abnormality is seen. Status post cholecystectomy. No biliary dilatation. Pancreas: 13 mm low-density lesion in the pancreatic body. 7 mm cystic lesion along the anterior superior pancreatic head. No other pancreatic masses or lesions. No inflammation. Spleen: Normal in size without focal abnormality. Adrenals/Urinary Tract: No adrenal masses. Kidneys normal size, orientation and position. Symmetric renal enhancement and excretion. 0.4 cm cyst, posterior mid to upper pole the left kidney. No other masses, no stones and no hydronephrosis. Normal ureters. Normal bladder. Stomach/Bowel: Mild hazy opacities noted in the fat adjacent to the proximal sigmoid colon, with there are multiple diverticula, consistent mild, uncomplicated diverticulitis. No other colonic inflammation.  Stomach is unremarkable. Small bowel is normal in caliber with no wall thickening or inflammation. Vascular/Lymphatic: No enlarged lymph nodes. Aortic atherosclerosis. Reproductive: Unremarkable. Other: No abdominal wall hernia or abnormality. No abdominopelvic ascites. Musculoskeletal: No fracture or acute finding. No osteoblastic or osteolytic lesions. IMPRESSION: 1. Mild, uncomplicated diverticulitis of the proximal sigmoid colon. No extraluminal air. No abscess. 2. No other acute abnormality within the abdomen or pelvis. 3. 6 mm nodule, right lower lobe, not evident on previous CT scans. 4. Small low-attenuation, cystic appearing, lesions in the pancreas. The larger of these, in the pancreatic body, was present on the CT dated 02/03/2010, and is unchanged. The smaller lesion along the anterior superior pancreatic head appears new. Based on consensus guidelines, recommend follow-up CT in 2 years to reassess. Electronically  Signed   By: Lajean Manes M.D.   On: 07/10/2018 13:58   Mr Sacrum Si Joints W Wo Contrast  Result Date: 07/01/2018 CLINICAL DATA:  Right hip pain, lower extremity weakness and fever. EXAM: MRI SACRUM WITHOUT AND WITH CONTRAST TECHNIQUE: Multiplanar, multisequence MR imaging of the sacrum was performed both before and after administration of intravenous contrast. CONTRAST:  8 cc Gadavist IV COMPARISON:  None. FINDINGS: Bones/Joint/Cartilage Marrow signal is normal without evidence of osteomyelitis, fracture or stress change. Sacroiliac joints are unremarkable. Ligaments Intact. Muscles and Tendons Intact.  No intramuscular fluid collection or edema. Soft tissues Subcutaneous edema and mild enhancement are seen in the soft tissues posterior to the sacrum suggestive of cellulitis. No abscess. Sacral nerve plexus appears normal. IMPRESSION: Findings suggestive of cellulitis in the subcutaneous fatty tissues posterior to the sacrum. Negative for abscess, septic joint or osteomyelitis. Electronically Signed   By: Inge Rise M.D.   On: 07/01/2018 15:34   Dg Chest Portable 1 View  Result Date: 07/10/2018 CLINICAL DATA:  Fever and elevated white blood cell count. History of cellulitis. EXAM: PORTABLE CHEST 1 VIEW COMPARISON:  06/29/2010 FINDINGS: The cardiac silhouette, mediastinal and hilar contours are within normal limits and stable. Mild tortuosity and calcification of the thoracic aorta. The lungs are clear. No pleural effusion. No worrisome pulmonary lesions. The bony thorax is intact. IMPRESSION: No acute cardiopulmonary findings. Electronically Signed   By: Marijo Sanes M.D.   On: 07/10/2018 13:08   Dg Chest Port 1 View  Result Date: 06/29/2018 CLINICAL DATA:  Wheezing and fevers. EXAM: PORTABLE CHEST 1 VIEW COMPARISON:  06/29/2018 earlier. FINDINGS: Patient slightly rotated to the right. Lungs are adequately inflated without lobar consolidation or effusion. Cardiomediastinal silhouette and  remainder of the exam is unchanged. IMPRESSION: No acute cardiopulmonary disease. Electronically Signed   By: Marin Olp M.D.   On: 06/29/2018 16:52   [place] Soft Tissue Non Vascular  Result Date: 06/29/2018 CLINICAL DATA:  85 year old male with history of puncture wound in the mid lateral left leg. High fevers. Evaluate for potential abscess. EXAM: ULTRASOUND LEFT LOWER EXTREMITY LIMITED TECHNIQUE: Ultrasound examination of the lower extremity soft tissues was performed in the area of clinical concern. COMPARISON:  NO PRIORS. FINDINGS: Joint Space: N/A. Muscles: Normal. Tendons: Normal Other Soft Tissue Structures: Normal. IMPRESSION: 1. No acute findings in the area of concern. Specifically, no focal fluid collection to suggest abscess. Electronically Signed   By: Vinnie Langton M.D.   On: 06/29/2018 19:59    Assessment/Plan 1. Paresthesia of hand, bilateral Worsening weakness,numbness and tingling on bilateral hands.Decreased grip strength/sensation noted on both hands.diabetic Neuropathy contributory factor verse neck pain.continue on Gabapentin 300 mg  tablet three times daily. - Ambulatory referral to Neurosurgery per hospital discharge recommendation.   2. Neck pain Continue tylenol and tramadol as needed for pain. - Ambulatory referral to Neurosurgery as above.   3. Chronic bilateral low back pain without sciatica No new onset of incontinence or loss of bowel control.unsteady gait with weakness on lower extremities. - continue with Home Health PT/OT for gait stability,ROM,exercise and muscle strengthening  - Ambulatory referral to Neurosurgery as above.   4. Chronic pain of both knees Continue current pain regimen.follow up with Orthopedic as directed.   5. Bilateral impacted cerumen TM not visualized.start Debrox 6.5 % otic solution instil 5 drops twice daily x 4 days then follow up at the office for ear lavage.   6. DM (diabetes mellitus), type 2 with  peripheral vascular complications (HCC) CBG reviewed stable. - Humulin 70/30 inject 23 units QHS discontinued during hospitalization. - continue on Lantus 45 units SQ daily. - check CBG twice daily notify provider's office if CBG < 75 or > 150   7. Essential hypertension B/p reviewed stable.hypotensive during hospital admission possible due to sepsis.off meds.continue to monitor.   8. Acute diverticulitis Afebrile this visit.he was septic with hypotension in Hospital.WBC 19.8 (07/10/2018);17.1(07/11/2018); 14.7 (07/12/2018).Treated with I.V antibiotics.Negative exam findings this visit.continue on oral Cipro and Flagyl as directed.   - CBC with Differential/Platelet; Future  9. Hypokalemia K+ 3.3 was repeleted with oral K-dur in the hospital.  - CMP with eGFR([facility]); Future  10. Thrombocytopenia (HCC)  low Plts 134 (07/01/2018); 295 (07/10/2018); 244 (07/12/2018). - CBC with Differential/Platelet; Future  11. Pulmonary nodule, right CT scan showed 6 mm right sided pulmonary nodule.Asymptomatic.follow up CT scan in 3 months recommended.will defer to PCP Dr.Reed to order in 3 months.   Family/ staff Communication: Reviewed plan of care with patient and wife.   Labs/tests ordered:  - CBC with Differential/Platelet; Future - CMP with eGFR([facility]); Future  Next visit: 4 days for bilateral ear lavage.   Sandrea Hughs, NP

## 2018-07-16 NOTE — Telephone Encounter (Signed)
Micheal [name]  Had CT scan at the hospital which showed 6 mm right sided pulmonary nodule.follow up CT scan in 3 months recommended.Please order CT scan on next visit when due.

## 2018-07-16 NOTE — Patient Instructions (Signed)
1.Debrox 6.5 % otic solution instil 5 drops into both ears twice daily x 4 days then follow up for ear lavage.  2. Continue to check blood sugars twice daily notify provider if blood sugars are less than 75 or greater than 150

## 2018-07-17 ENCOUNTER — Telehealth: Payer: Self-pay

## 2018-07-17 NOTE — Telephone Encounter (Signed)
Spoke with pt's wife and advised per verbal from Dr. Sheppard Coil to keep a record and call back on Monday with results.

## 2018-07-17 NOTE — Telephone Encounter (Signed)
Patients wife called because she was told at her husbands last visit that she should call if his blood sugars were above 150 she said that it was 152 on 07/16/2018 but is 104 today 07/17/2018

## 2018-07-21 ENCOUNTER — Telehealth: Payer: Self-pay | Admitting: *Deleted

## 2018-07-21 DIAGNOSIS — R030 Elevated blood-pressure reading, without diagnosis of hypertension: Secondary | ICD-10-CM | POA: Diagnosis not present

## 2018-07-21 DIAGNOSIS — G5602 Carpal tunnel syndrome, left upper limb: Secondary | ICD-10-CM | POA: Diagnosis not present

## 2018-07-21 DIAGNOSIS — M4722 Other spondylosis with radiculopathy, cervical region: Secondary | ICD-10-CM | POA: Diagnosis not present

## 2018-07-21 NOTE — Telephone Encounter (Signed)
Patient wife called with Blood Sugar Readings: 07/17/18- am 104 pm 152 07/18/18- am 154 pm199 07/19/18- am 208 pm 204 07/20/18- am 141 pm 213 07/21/18- am 169  Taking Insulin as directed. Told him to call if it was over 150. The hospital told him not to take his Insulin if his blood sugar was under 150. Please Advise.

## 2018-07-22 ENCOUNTER — Inpatient Hospital Stay: Payer: Medicare Other | Admitting: Internal Medicine

## 2018-07-22 NOTE — Telephone Encounter (Signed)
Hennie Duos, MD  You 15 hours ago (5:01 PM)   I would tell pt BS look good. It takes a little time to get a new regiment just right and I wouldn't do fine tuning until an office visit. Continue current regimen and continue keeping log of blood sugars.   Routing comment        Patient wife notified and agreed.

## 2018-07-30 ENCOUNTER — Institutional Professional Consult (permissible substitution): Payer: Medicare Other | Admitting: Neurology

## 2018-07-30 DIAGNOSIS — M542 Cervicalgia: Secondary | ICD-10-CM | POA: Diagnosis not present

## 2018-07-30 DIAGNOSIS — M4722 Other spondylosis with radiculopathy, cervical region: Secondary | ICD-10-CM | POA: Diagnosis not present

## 2018-07-31 DIAGNOSIS — A419 Sepsis, unspecified organism: Secondary | ICD-10-CM | POA: Diagnosis not present

## 2018-07-31 DIAGNOSIS — I10 Essential (primary) hypertension: Secondary | ICD-10-CM | POA: Diagnosis not present

## 2018-07-31 DIAGNOSIS — E1151 Type 2 diabetes mellitus with diabetic peripheral angiopathy without gangrene: Secondary | ICD-10-CM | POA: Diagnosis not present

## 2018-07-31 DIAGNOSIS — L03116 Cellulitis of left lower limb: Secondary | ICD-10-CM | POA: Diagnosis not present

## 2018-07-31 DIAGNOSIS — E1140 Type 2 diabetes mellitus with diabetic neuropathy, unspecified: Secondary | ICD-10-CM | POA: Diagnosis not present

## 2018-07-31 DIAGNOSIS — S81832D Puncture wound without foreign body, left lower leg, subsequent encounter: Secondary | ICD-10-CM | POA: Diagnosis not present

## 2018-08-01 ENCOUNTER — Other Ambulatory Visit: Payer: Self-pay

## 2018-08-01 ENCOUNTER — Other Ambulatory Visit: Payer: Medicare Other

## 2018-08-01 DIAGNOSIS — E1151 Type 2 diabetes mellitus with diabetic peripheral angiopathy without gangrene: Secondary | ICD-10-CM | POA: Diagnosis not present

## 2018-08-02 LAB — BASIC METABOLIC PANEL
BUN: 10 mg/dL (ref 7–25)
CO2: 21 mmol/L (ref 20–32)
Calcium: 9.5 mg/dL (ref 8.6–10.3)
Chloride: 108 mmol/L (ref 98–110)
Creat: 0.79 mg/dL (ref 0.70–1.11)
Glucose, Bld: 126 mg/dL — ABNORMAL HIGH (ref 65–99)
Potassium: 4.3 mmol/L (ref 3.5–5.3)
Sodium: 141 mmol/L (ref 135–146)

## 2018-08-02 LAB — HEMOGLOBIN A1C
Hgb A1c MFr Bld: 8.1 % of total Hgb — ABNORMAL HIGH (ref <5.7)
Mean Plasma Glucose: 186 (calc)
eAG (mmol/L): 10.3 (calc)

## 2018-08-04 ENCOUNTER — Other Ambulatory Visit: Payer: Medicare Other

## 2018-08-04 DIAGNOSIS — E1140 Type 2 diabetes mellitus with diabetic neuropathy, unspecified: Secondary | ICD-10-CM | POA: Diagnosis not present

## 2018-08-04 DIAGNOSIS — W271XXD Contact with garden tool, subsequent encounter: Secondary | ICD-10-CM | POA: Diagnosis not present

## 2018-08-04 DIAGNOSIS — A419 Sepsis, unspecified organism: Secondary | ICD-10-CM | POA: Diagnosis not present

## 2018-08-04 DIAGNOSIS — D696 Thrombocytopenia, unspecified: Secondary | ICD-10-CM | POA: Diagnosis not present

## 2018-08-04 DIAGNOSIS — Z794 Long term (current) use of insulin: Secondary | ICD-10-CM | POA: Diagnosis not present

## 2018-08-04 DIAGNOSIS — I10 Essential (primary) hypertension: Secondary | ICD-10-CM | POA: Diagnosis not present

## 2018-08-04 DIAGNOSIS — E1151 Type 2 diabetes mellitus with diabetic peripheral angiopathy without gangrene: Secondary | ICD-10-CM | POA: Diagnosis not present

## 2018-08-04 DIAGNOSIS — S81832D Puncture wound without foreign body, left lower leg, subsequent encounter: Secondary | ICD-10-CM | POA: Diagnosis not present

## 2018-08-04 DIAGNOSIS — Z8546 Personal history of malignant neoplasm of prostate: Secondary | ICD-10-CM | POA: Diagnosis not present

## 2018-08-04 DIAGNOSIS — L03116 Cellulitis of left lower limb: Secondary | ICD-10-CM | POA: Diagnosis not present

## 2018-08-04 DIAGNOSIS — G5602 Carpal tunnel syndrome, left upper limb: Secondary | ICD-10-CM | POA: Diagnosis not present

## 2018-08-06 DIAGNOSIS — E1151 Type 2 diabetes mellitus with diabetic peripheral angiopathy without gangrene: Secondary | ICD-10-CM | POA: Diagnosis not present

## 2018-08-06 DIAGNOSIS — A419 Sepsis, unspecified organism: Secondary | ICD-10-CM | POA: Diagnosis not present

## 2018-08-06 DIAGNOSIS — E1140 Type 2 diabetes mellitus with diabetic neuropathy, unspecified: Secondary | ICD-10-CM | POA: Diagnosis not present

## 2018-08-06 DIAGNOSIS — I10 Essential (primary) hypertension: Secondary | ICD-10-CM | POA: Diagnosis not present

## 2018-08-06 DIAGNOSIS — S81832D Puncture wound without foreign body, left lower leg, subsequent encounter: Secondary | ICD-10-CM | POA: Diagnosis not present

## 2018-08-06 DIAGNOSIS — L03116 Cellulitis of left lower limb: Secondary | ICD-10-CM | POA: Diagnosis not present

## 2018-08-12 DIAGNOSIS — G5602 Carpal tunnel syndrome, left upper limb: Secondary | ICD-10-CM | POA: Diagnosis not present

## 2018-08-12 DIAGNOSIS — M170 Bilateral primary osteoarthritis of knee: Secondary | ICD-10-CM | POA: Diagnosis not present

## 2018-08-13 DIAGNOSIS — X32XXXD Exposure to sunlight, subsequent encounter: Secondary | ICD-10-CM | POA: Diagnosis not present

## 2018-08-13 DIAGNOSIS — D225 Melanocytic nevi of trunk: Secondary | ICD-10-CM | POA: Diagnosis not present

## 2018-08-13 DIAGNOSIS — Z08 Encounter for follow-up examination after completed treatment for malignant neoplasm: Secondary | ICD-10-CM | POA: Diagnosis not present

## 2018-08-13 DIAGNOSIS — Z85828 Personal history of other malignant neoplasm of skin: Secondary | ICD-10-CM | POA: Diagnosis not present

## 2018-08-13 DIAGNOSIS — L570 Actinic keratosis: Secondary | ICD-10-CM | POA: Diagnosis not present

## 2018-08-14 ENCOUNTER — Telehealth: Payer: Self-pay | Admitting: Internal Medicine

## 2018-08-14 ENCOUNTER — Ambulatory Visit (INDEPENDENT_AMBULATORY_CARE_PROVIDER_SITE_OTHER): Payer: Medicare Other | Admitting: Internal Medicine

## 2018-08-14 ENCOUNTER — Other Ambulatory Visit: Payer: Self-pay

## 2018-08-14 ENCOUNTER — Encounter: Payer: Self-pay | Admitting: Internal Medicine

## 2018-08-14 VITALS — BP 140/60 | HR 86 | Temp 98.3°F | Ht 67.0 in | Wt 151.0 lb

## 2018-08-14 DIAGNOSIS — G5622 Lesion of ulnar nerve, left upper limb: Secondary | ICD-10-CM | POA: Diagnosis not present

## 2018-08-14 DIAGNOSIS — M25561 Pain in right knee: Secondary | ICD-10-CM

## 2018-08-14 DIAGNOSIS — G5602 Carpal tunnel syndrome, left upper limb: Secondary | ICD-10-CM

## 2018-08-14 DIAGNOSIS — E1151 Type 2 diabetes mellitus with diabetic peripheral angiopathy without gangrene: Secondary | ICD-10-CM

## 2018-08-14 DIAGNOSIS — M25562 Pain in left knee: Secondary | ICD-10-CM

## 2018-08-14 DIAGNOSIS — I10 Essential (primary) hypertension: Secondary | ICD-10-CM

## 2018-08-14 DIAGNOSIS — G8929 Other chronic pain: Secondary | ICD-10-CM

## 2018-08-14 MED ORDER — BD PEN NEEDLE SHORT U/F 31G X 8 MM MISC: 3 refills | Status: DC

## 2018-08-14 MED ORDER — LANTUS SOLOSTAR 100 UNIT/ML ~~LOC~~ SOPN: PEN_INJECTOR | SUBCUTANEOUS | 5 refills | Status: DC

## 2018-08-14 MED ORDER — METOPROLOL TARTRATE 25 MG PO TABS: 12.5000 mg | ORAL_TABLET | Freq: Two times a day (BID) | ORAL | 3 refills | Status: DC

## 2018-08-14 NOTE — Progress Notes (Signed)
Location:  [facility]   Advanced Directives 07/10/2018  Does Patient Have a Medical Advance Directive? Yes  Type of Advance Directive [place]  Does patient want to make changes to medical advance directive? No - Patient declined  Copy of [place] in Chart? -  Would patient like information on creating a medical advance directive? -     Chief Complaint  Patient presents with  . Medical Management of Chronic Issues    21mth follow-up    HPI: Patient is a 85 y.o. male seen today for medical management of chronic diseases.    He has multiple concerns regarding his recent hospitalization for cellulitis and diverticulitis in June 2020.   He is currently taking Lantus 45 units/day with blood glucose over 150. Blood sugar is checked every morning before Lantus administration. Prior to his recent hospitalizations, he was on humulin 70/30 twice a day. He does not report any incidents of hypoglycemia. He is concerned about the change in his current insulin regimen and would like clarification. He is following a diet that is low in sugar and carbs. Since, June 2020 he has lost 9 pounds and plans to try to keep the weight off.   Chronic bilateral knee pain is being followed by Dr. Veverly Fells at [facility]. Unfortunately due to his chronic conditions and advanced age, he is not a candidate for knee arthroplasty. He receives cortisol injections periodically. He states the injections do not help with pain. Tylenol is taken as needed, along with Tramadol 50 mg every 6 hours as needed. Aspercreme has not been used within the last two weeks. He uses a cane to assist with ambulation. No recent falls or injuried have occurred.   Upper extremity paresthesia is being followed by neurology. EMG nerve conduction study showed left carpal tunnel syndrome. Neurosurgery was recommended during last hospitalization. He is currently taking gabapentin 300 mg capsules three times a  day. He is also wearing a left wrist brace at all times to help with the pain. He states he is following up with Dr. Veverly Fells 08/15/18 for his carpal tunnel and not neurology.   He has a history of prostate cancer and is being followed by urology. He does not report any concerns at this time. He takes his tamsulosin 0.4 mg daily.   Since hospitalization for diverticulitis, patient has not had any rebound symptoms. He states he is having regular bowel movements. Stool softner is taken daily. He is also staying hydrated and avoiding trigger foods.   Past Medical History:  Diagnosis Date  . Allergic rhinitis, cause unspecified   . Carpal tunnel syndrome   . Cervical spondylosis without myelopathy   . Cervical spondylosis without myelopathy   . Cervicalgia   . Diverticulosis of colon (without mention of hemorrhage)   . Esophageal reflux   . Hypertrophy of prostate with urinary obstruction and other lower urinary tract symptoms (LUTS)   . Insomnia, unspecified   . Internal hemorrhoids without mention of complication   . Irritable bowel syndrome   . Macular degeneration (senile) of retina, unspecified   . Malignant neoplasm of prostate ([place])   . Neurogenic bladder, NOS   . Neuropathy   . Nonspecific (abnormal) findings on radiological and other examination of gastrointestinal tract   . Orthostatic hypotension   . Osteoarthrosis, unspecified whether generalized or localized, unspecified site   . Other and unspecified hyperlipidemia   . Other malaise and fatigue   . Other specified disorder  of rectum and anus    Radiation Proctitus  . Paroxysmal supraventricular tachycardia ([place])   . Restless legs syndrome (RLS)   . Retinal detachment with retinal defect, unspecified   . Right bundle branch block    Incomplete  . Spermatocele    Right  . Trigger finger (acquired)   . Type II or unspecified type diabetes mellitus without mention of complication, not stated as uncontrolled   . Type II or  unspecified type diabetes mellitus without mention of complication, uncontrolled   . Unspecified essential hypertension    patient denies, states he has a "fast heart rate, but no high BP"  . Vitamin D deficiency     Past Surgical History:  Procedure Laterality Date  . Angiolipoma  1980   Right arm  . APPENDECTOMY  1951  . CHOLECYSTECTOMY  01/1994  . COLONOSCOPY  04/11/2010  . COLONOSCOPY W/ POLYPECTOMY  2005   Removed 2 (two) polyps  . DUPUYTREN CONTRACTURE RELEASE Bilateral 1989  . EYE SURGERY  2006   Left  . EYE SURGERY  2006   Retina reattachment, right  . EYE SURGERY  02/1994   Cataract surgery, right  . EYE SURGERY  01/1995   Cataract surgery, left  . KNEE SURGERY  1979  . PARATHYROIDECTOMY Left 12/27/2016   Procedure: LEFT INFERIOR PARATHYROIDECTOMY;  Surgeon: Armandina Gemma, MD;  Location: [facility];  Service: General;  Laterality: Left;  . POLYPECTOMY  1955   Vocal cord  . RETINAL DETACHMENT SURGERY Right 2005  . SHOULDER ARTHROSCOPY Right 03/22/15   [place]  . TENDON RELEASE  01/1997   seven fingers  . TRANSURETHRAL RESECTION OF PROSTATE  08/1989  . VITRECTOMY Left 04/23/2013   [facility]    Allergies  Allergen Reactions  . Aspirin Other (See Comments)    Bothers stomach, thins blood   . Atorvastatin Other (See Comments)    Muscular pain and weakness  . Prednisone Other (See Comments)    Upset stomach, can take by shot not by mouth  . Simvastatin Other (See Comments)    Muscular pain and weakness  . Sulfa Antibiotics Other (See Comments)    Upset stomach   . Latex Rash  . Metformin Hcl Nausea Only  . Voltaren [Diclofenac Sodium] Other (See Comments)    Unknown    Outpatient Encounter Medications as of 08/14/2018  Medication Sig  . acetaminophen (TYLENOL) 500 MG tablet Take 1,000 mg by mouth 3 (three) times daily.  Marland Kitchen alum & mag hydroxide-simeth (MAALOX/MYLANTA) 200-200-20 MG/5ML suspension Take 30 mLs by mouth as needed for indigestion or heartburn.  .  cholecalciferol (VITAMIN D) 400 units TABS tablet Take 400 Units by mouth daily.  Marland Kitchen docusate sodium (COLACE) 100 MG capsule Take 100 mg by mouth daily as needed.   . famotidine (PEPCID AC) 10 MG chewable tablet Chew 10 mg by mouth daily as needed for heartburn.  . gabapentin (NEURONTIN) 300 MG capsule Take 1 capsule (300 mg total) by mouth 3 (three) times daily.  . Insulin Glargine (LANTUS SOLOSTAR) 100 UNIT/ML Solostar Pen INJECT 45 UNITS  SUBCUTANEOUSLY IN THE  MORNING  . Insulin Pen Needle (B-D ULTRAFINE III SHORT PEN) 31G X 8 MM MISC Use twice daily for insulin  . loperamide (IMODIUM) 1 MG/5ML solution Take 1 mg by mouth as needed for diarrhea or loose stools.  Marland Kitchen loratadine (CLARITIN) 10 MG tablet Take 10 mg by mouth daily.  . ondansetron (ZOFRAN) 4 MG tablet Take 1 tablet (4 mg total)  by mouth every 6 (six) hours as needed for nausea.  . Probiotic Product (PROBIOTIC-10) CHEW Chew 1 each by mouth daily.   . tamsulosin (FLOMAX) 0.4 MG CAPS capsule Take 1 capsule (0.4 mg total) by mouth every evening.  . traMADol (ULTRAM) 50 MG tablet Take 1 tablet (50 mg total) by mouth every 6 (six) hours as needed for moderate pain.  Marland Kitchen trolamine salicylate (ASPERCREME) 10 % cream Apply 1 application topically as needed for muscle pain.   No facility-administered encounter medications on file as of 08/14/2018.     Review of Systems:  Review of Systems  Constitutional: Negative for appetite change, fatigue and unexpected weight change.  HENT: Positive for hearing loss. Negative for trouble swallowing.   Eyes: Positive for photophobia. Negative for visual disturbance.  Respiratory: Negative for cough, shortness of breath and wheezing.   Cardiovascular: Positive for leg swelling. Negative for chest pain and palpitations.  Gastrointestinal: Negative for constipation, diarrhea and nausea.  Endocrine: Negative for polydipsia, polyphagia and polyuria.  Genitourinary: Positive for frequency. Negative for dysuria  and hematuria.  Musculoskeletal: Positive for arthralgias, back pain, gait problem, joint swelling and myalgias.  Skin: Negative.   Neurological: Positive for numbness. Negative for dizziness and headaches.  Psychiatric/Behavioral: Negative for confusion, sleep disturbance and suicidal ideas. The patient is not nervous/anxious.     Health Maintenance  Topic Date Due  . INFLUENZA VACCINE  08/23/2018  . OPHTHALMOLOGY EXAM  08/27/2018  . URINE MICROALBUMIN  12/31/2018  . HEMOGLOBIN A1C  02/01/2019  . FOOT EXAM  04/03/2019  . TETANUS/TDAP  06/28/2028  . PNA vac Low Risk Adult  Completed    Physical Exam: Vitals:   08/14/18 1056  BP: 140/60  Pulse: 86  Temp: 98.3 F (36.8 C)  TempSrc: Oral  SpO2: 98%  Weight: 151 lb (68.5 kg)  Height: 5\' 7"  (1.702 m)   Body mass index is 23.65 kg/m. Physical Exam Vitals signs reviewed.  Constitutional:      General: He is not in acute distress.    Appearance: Normal appearance. He is normal weight. He is not ill-appearing.  HENT:     Head: Normocephalic.     Mouth/Throat:     Mouth: Mucous membranes are dry.  Cardiovascular:     Rate and Rhythm: Normal rate and regular rhythm.     Pulses: Normal pulses.     Heart sounds: Normal heart sounds. No murmur.  Pulmonary:     Effort: Pulmonary effort is normal. No respiratory distress.     Breath sounds: Normal breath sounds. No wheezing.  Abdominal:     General: Abdomen is flat. Bowel sounds are normal.     Palpations: Abdomen is soft.     Tenderness: There is no abdominal tenderness.  Musculoskeletal:     Right lower leg: Edema present.     Left lower leg: Edema present.     Comments: Left wrist tenderness with palpation. Wrist brace present during examination.  Can move all four extremities. Uses a cane to aide with ambulation. Slight edema in bilateral ankles.   Skin:    General: Skin is warm and dry.     Capillary Refill: Capillary refill takes 2 to 3 seconds.  Neurological:      General: No focal deficit present.     Mental Status: He is alert and oriented to person, place, and time.  Psychiatric:        Mood and Affect: Mood normal.  Behavior: Behavior normal.     Labs reviewed: Basic Metabolic Panel: Recent Labs    07/11/18 0259 07/12/18 0353 08/01/18 0844  NA 137 135 141  K 3.6 3.9 4.3  CL 108 109 108  CO2 20* 19* 21  GLUCOSE 119* 100* 126*  BUN 16 13 10   CREATININE 1.29* 1.16 0.79  CALCIUM 7.8* 7.5* 9.5  MG  --  2.1  --   TSH  --  0.841  --    Liver Function Tests: Recent Labs    07/10/18 1130 07/11/18 0259 07/12/18 0353  AST 22 29 21   ALT 23 23 19   ALKPHOS 75 57 52  BILITOT 0.7 0.6 0.5  PROT 6.2* 4.4* 4.2*  ALBUMIN 3.3* 2.4* 2.3*   No results for input(s): LIPASE, AMYLASE in the last 8760 hours. No results for input(s): AMMONIA in the last 8760 hours. CBC: Recent Labs    07/08/18 1625 07/10/18 1130 07/10/18 2149 07/11/18 0259 07/12/18 0353  WBC 21.3* 9.5 19.8* 17.1* 14.7*  NEUTROABS 18,169* 7.0  --   --  13.0*  HGB 13.0* 12.2* 11.4* 10.9* 9.5*  HCT 38.4* 37.1* 34.0* 33.5* 28.9*  MCV 89.3 92.5 90.7 92.0 91.5  PLT 339 295 267 253 244   Lipid Panel: Recent Labs    08/15/17 0820  CHOL 251*  HDL 38*  LDLCALC 171*  TRIG 256*  CHOLHDL 6.6*   Lab Results  Component Value Date   HGBA1C 8.1 (H) 08/01/2018    Procedures since last visit: No results found.  Assessment/Plan 1. Paresthesia of hands, bilateral - followed by Dr. Veverly Fells, next appointment 08/15/18 - continue gabapentin as prescribed - continue wearing wrist brace  2. Chronic bilateral low back pain without sciatica - no new symptoms to suggest progression - continue to use cane for gait stability - continue gabapentin as prescribed for nerve pain  3. Bilateral knee pain, chronic - followed by Dr. Veverly Fells - continue current pain regimen  4. Diabetes mellitus, type 2 with peripheral vascular complications (HCC) - CBG log reviewed - 70/30 was  discontinued in hospital and lantus 45 units/day was started. Lantus is only to be given if blood glucose is under 150 - will continue Lantus 45 units/day, but advise patient to take if blood glucose is 120 or higher.   - encourage diet low in sugar and carbs - CMP with GFR- future - Hemoglobin A1C-future  5. Essential hypertension - metoprolol was discontinued after hospital visit - bp has increased since last visit -restart 12.5 mg lopressor daily  6. Acute diverticulitis - resolved, no new symptoms to suggest reoccurrence -CBC with differential/Platelet- future  7. Pulmonary nodule, right - CT scan showed 69mm right sided pulmonary nodule - asymptomatic - follow up CT scan in 3 months   Labs/tests ordered: CBC with differential/Platelets, CMP with GFR, Hemoglobin A1C, CT scan of chest Next appt:  4 month follow up

## 2018-08-14 NOTE — Telephone Encounter (Signed)
Marlowe Kays with [facility] called.  She stated patient cancelled physical therapy. Per Marlowe Kays patient had other appointments scheduled and felt it was too much for him to keep physical therapy appointment.

## 2018-08-14 NOTE — Patient Instructions (Signed)
Resume taking lantus 45 units each morning as long as your sugar is above 120.     Resume metoprolol (lopressor) 12.5mg  by mouth twice a day for your blood pressure.

## 2018-08-14 NOTE — Addendum Note (Signed)
Addended by: Gayland Curry on: 08/14/2018 05:21 PM   Modules accepted: Orders

## 2018-08-15 DIAGNOSIS — G5602 Carpal tunnel syndrome, left upper limb: Secondary | ICD-10-CM | POA: Diagnosis not present

## 2018-08-20 DIAGNOSIS — A419 Sepsis, unspecified organism: Secondary | ICD-10-CM | POA: Diagnosis not present

## 2018-08-20 DIAGNOSIS — I10 Essential (primary) hypertension: Secondary | ICD-10-CM | POA: Diagnosis not present

## 2018-08-20 DIAGNOSIS — S81832D Puncture wound without foreign body, left lower leg, subsequent encounter: Secondary | ICD-10-CM | POA: Diagnosis not present

## 2018-08-20 DIAGNOSIS — L03116 Cellulitis of left lower limb: Secondary | ICD-10-CM | POA: Diagnosis not present

## 2018-08-20 DIAGNOSIS — E1151 Type 2 diabetes mellitus with diabetic peripheral angiopathy without gangrene: Secondary | ICD-10-CM | POA: Diagnosis not present

## 2018-08-20 DIAGNOSIS — E1140 Type 2 diabetes mellitus with diabetic neuropathy, unspecified: Secondary | ICD-10-CM | POA: Diagnosis not present

## 2018-08-27 DIAGNOSIS — E1140 Type 2 diabetes mellitus with diabetic neuropathy, unspecified: Secondary | ICD-10-CM | POA: Diagnosis not present

## 2018-08-27 DIAGNOSIS — I10 Essential (primary) hypertension: Secondary | ICD-10-CM | POA: Diagnosis not present

## 2018-08-27 DIAGNOSIS — E1151 Type 2 diabetes mellitus with diabetic peripheral angiopathy without gangrene: Secondary | ICD-10-CM | POA: Diagnosis not present

## 2018-08-27 DIAGNOSIS — A419 Sepsis, unspecified organism: Secondary | ICD-10-CM | POA: Diagnosis not present

## 2018-08-27 DIAGNOSIS — S81832D Puncture wound without foreign body, left lower leg, subsequent encounter: Secondary | ICD-10-CM | POA: Diagnosis not present

## 2018-08-27 DIAGNOSIS — L03116 Cellulitis of left lower limb: Secondary | ICD-10-CM | POA: Diagnosis not present

## 2018-09-04 ENCOUNTER — Other Ambulatory Visit: Payer: Self-pay

## 2018-09-04 ENCOUNTER — Encounter: Payer: Self-pay | Admitting: Internal Medicine

## 2018-09-04 ENCOUNTER — Ambulatory Visit (INDEPENDENT_AMBULATORY_CARE_PROVIDER_SITE_OTHER): Payer: Medicare Other | Admitting: Internal Medicine

## 2018-09-04 VITALS — BP 110/68 | HR 96 | Temp 98.3°F | Ht 67.0 in | Wt 143.0 lb

## 2018-09-04 DIAGNOSIS — E210 Primary hyperparathyroidism: Secondary | ICD-10-CM

## 2018-09-04 DIAGNOSIS — R202 Paresthesia of skin: Secondary | ICD-10-CM | POA: Diagnosis not present

## 2018-09-04 DIAGNOSIS — R432 Parageusia: Secondary | ICD-10-CM

## 2018-09-04 DIAGNOSIS — R531 Weakness: Secondary | ICD-10-CM | POA: Diagnosis not present

## 2018-09-04 DIAGNOSIS — R0600 Dyspnea, unspecified: Secondary | ICD-10-CM

## 2018-09-04 DIAGNOSIS — R911 Solitary pulmonary nodule: Secondary | ICD-10-CM

## 2018-09-04 DIAGNOSIS — R6889 Other general symptoms and signs: Secondary | ICD-10-CM | POA: Diagnosis not present

## 2018-09-04 DIAGNOSIS — R0609 Other forms of dyspnea: Secondary | ICD-10-CM

## 2018-09-04 DIAGNOSIS — Z20822 Contact with and (suspected) exposure to covid-19: Secondary | ICD-10-CM

## 2018-09-04 NOTE — Progress Notes (Signed)
Location:  [facility]  Advanced Directives 07/10/2018  Does Patient Have a Medical Advance Directive? Yes  Type of Advance Directive [place]  Does patient want to make changes to medical advance directive? No - Patient declined  Copy of [place] in Chart? -  Would patient like information on creating a medical advance directive? -     Chief Complaint  Patient presents with  . Acute Visit    not feeling well, no energy    HPI: Patient is a 85 y.o. [name] seen today for weakness and loss of appetite. Since 08/14/2018, he has lost 8 pounds. Has to force himself to eat. Foods often taste salty or have no taste. He is not experiencing any abdominal pain. Bowel movements are regular and brown. He is able to hydrate and drink 4-5 glasses of water daily.   He claims his voice has changed and is hoarse all the time. He also experiences mouth dryness.   Fatigue and weakness started about 2 weeks ago.He compares the fatigue and weakness to a similar event he had with his parathyroid.   He has trouble ambulating due to shortness of breath. He will sit when his breathing is bad.  No recent falls or injuries.   He is social distancing due to [place]. His daughter delivers his groceries.    Past Medical History:  Diagnosis Date  . Allergic rhinitis, cause unspecified   . Carpal tunnel syndrome   . Cervical spondylosis without myelopathy   . Cervical spondylosis without myelopathy   . Cervicalgia   . Diverticulosis of colon (without mention of hemorrhage)   . Esophageal reflux   . Hypertrophy of prostate with urinary obstruction and other lower urinary tract symptoms (LUTS)   . Insomnia, unspecified   . Internal hemorrhoids without mention of complication   . Irritable bowel syndrome   . Macular degeneration (senile) of retina, unspecified   . Malignant neoplasm of prostate ([place])   . Neurogenic bladder, NOS   . Neuropathy   . Nonspecific  (abnormal) findings on radiological and other examination of gastrointestinal tract   . Orthostatic hypotension   . Osteoarthrosis, unspecified whether generalized or localized, unspecified site   . Other and unspecified hyperlipidemia   . Other malaise and fatigue   . Other specified disorder of rectum and anus    Radiation Proctitus  . Paroxysmal supraventricular tachycardia ([place])   . Restless legs syndrome (RLS)   . Retinal detachment with retinal defect, unspecified   . Right bundle branch block    Incomplete  . Spermatocele    Right  . Trigger finger (acquired)   . Type II or unspecified type diabetes mellitus without mention of complication, not stated as uncontrolled   . Type II or unspecified type diabetes mellitus without mention of complication, uncontrolled   . Unspecified essential hypertension    patient denies, states he has a "fast heart rate, but no high BP"  . Vitamin D deficiency     Past Surgical History:  Procedure Laterality Date  . Angiolipoma  1980   Right arm  . APPENDECTOMY  1951  . CHOLECYSTECTOMY  01/1994  . COLONOSCOPY  04/11/2010  . COLONOSCOPY W/ POLYPECTOMY  2005   Removed 2 (two) polyps  . DUPUYTREN CONTRACTURE RELEASE Bilateral 1989  . EYE SURGERY  2006   Left  . EYE SURGERY  2006   Retina reattachment, right  . EYE SURGERY  02/1994   Cataract surgery, right  .  EYE SURGERY  01/1995   Cataract surgery, left  . KNEE SURGERY  1979  . PARATHYROIDECTOMY Left 12/27/2016   Procedure: LEFT INFERIOR PARATHYROIDECTOMY;  Surgeon: Armandina Gemma, MD;  Location: [facility];  Service: General;  Laterality: Left;  . POLYPECTOMY  1955   Vocal cord  . RETINAL DETACHMENT SURGERY Right 2005  . SHOULDER ARTHROSCOPY Right 03/22/15   [facility]  . TENDON RELEASE  01/1997   seven fingers  . TRANSURETHRAL RESECTION OF PROSTATE  08/1989  . VITRECTOMY Left 04/23/2013   [facility]    Allergies  Allergen Reactions  . Aspirin Other (See Comments)    Bothers stomach,  thins blood   . Atorvastatin Other (See Comments)    Muscular pain and weakness  . Prednisone Other (See Comments)    Upset stomach, can take by shot not by mouth  . Simvastatin Other (See Comments)    Muscular pain and weakness  . Sulfa Antibiotics Other (See Comments)    Upset stomach   . Latex Rash  . Metformin Hcl Nausea Only  . Voltaren [Diclofenac Sodium] Other (See Comments)    Unknown    Outpatient Encounter Medications as of 09/04/2018  Medication Sig  . acetaminophen (TYLENOL) 500 MG tablet Take 1,000 mg by mouth 3 (three) times daily.  Marland Kitchen alum & mag hydroxide-simeth (MAALOX/MYLANTA) 200-200-20 MG/5ML suspension Take 30 mLs by mouth as needed for indigestion or heartburn.  . cholecalciferol (VITAMIN D) 400 units TABS tablet Take 400 Units by mouth daily.  Marland Kitchen docusate sodium (COLACE) 100 MG capsule Take 100 mg by mouth daily as needed.   . famotidine (PEPCID AC) 10 MG chewable tablet Chew 10 mg by mouth daily as needed for heartburn.  . gabapentin (NEURONTIN) 300 MG capsule Take 1 capsule (300 mg total) by mouth 3 (three) times daily.  . Insulin Glargine (LANTUS SOLOSTAR) 100 UNIT/ML Solostar Pen INJECT 45 UNITS  SUBCUTANEOUSLY IN THE  MORNING if CBG over 120  . Insulin Pen Needle (B-D ULTRAFINE III SHORT PEN) 31G X 8 MM MISC Use once daily for insulin  . loperamide (IMODIUM) 1 MG/5ML solution Take 1 mg by mouth as needed for diarrhea or loose stools.  Marland Kitchen loratadine (CLARITIN) 10 MG tablet Take 10 mg by mouth daily.  . metoprolol tartrate (LOPRESSOR) 25 MG tablet Take 0.5 tablets (12.5 mg total) by mouth 2 (two) times daily.  . ondansetron (ZOFRAN) 4 MG tablet Take 1 tablet (4 mg total) by mouth every 6 (six) hours as needed for nausea.  . Probiotic Product (PROBIOTIC-10) CHEW Chew 1 each by mouth daily.   . tamsulosin (FLOMAX) 0.4 MG CAPS capsule Take 1 capsule (0.4 mg total) by mouth every evening.  . traMADol (ULTRAM) 50 MG tablet Take 1 tablet (50 mg total) by mouth every 6  (six) hours as needed for moderate pain.  Marland Kitchen trolamine salicylate (ASPERCREME) 10 % cream Apply 1 application topically as needed for muscle pain.   No facility-administered encounter medications on file as of 09/04/2018.     Review of Systems:  Review of Systems  Constitutional: Positive for activity change, appetite change and fatigue.  HENT: Positive for voice change. Negative for dental problem and trouble swallowing.   Respiratory: Positive for shortness of breath. Negative for cough and wheezing.   Cardiovascular: Negative for chest pain, palpitations and leg swelling.  Gastrointestinal: Negative for abdominal pain, anal bleeding, blood in stool, constipation, diarrhea and nausea.  Genitourinary: Negative for dysuria, frequency and hematuria.  Neurological: Positive for  weakness. Negative for dizziness and headaches.       Numbness at fingertips  Psychiatric/Behavioral: Negative for dysphoric mood and sleep disturbance. The patient is not nervous/anxious.     Health Maintenance  Topic Date Due  . INFLUENZA VACCINE  08/23/2018  . OPHTHALMOLOGY EXAM  08/27/2018  . URINE MICROALBUMIN  12/31/2018  . HEMOGLOBIN A1C  02/01/2019  . FOOT EXAM  04/03/2019  . TETANUS/TDAP  06/28/2028  . PNA vac Low Risk Adult  Completed    Physical Exam: Vitals:   09/04/18 1322  BP: 110/68  Pulse: 96  Temp: 98.3 F (36.8 C)  TempSrc: Oral  SpO2: 95%  Weight: 143 lb (64.9 kg)  Height: 5\' 7"  (1.702 m)   Body mass index is 22.4 kg/m. Physical Exam Vitals signs reviewed.  Constitutional:      General: He is not in acute distress.    Appearance: Normal appearance. He is normal weight. He is not ill-appearing.  HENT:     Head: Normocephalic.     Mouth/Throat:     Mouth: Mucous membranes are dry.     Pharynx: Oropharynx is clear.  Neck:     Musculoskeletal: Full passive range of motion without pain, normal range of motion and neck supple. No edema.     Thyroid: Thyroid tenderness present.  No thyromegaly.   Cardiovascular:     Rate and Rhythm: Normal rate and regular rhythm.     Pulses: Normal pulses.     Heart sounds: Normal heart sounds. No murmur.  Pulmonary:     Effort: Pulmonary effort is normal. No respiratory distress.     Breath sounds: Normal breath sounds. No wheezing.  Abdominal:     General: Abdomen is flat. Bowel sounds are normal.     Palpations: Abdomen is soft.  Musculoskeletal:        General: Signs of injury present.     Comments: Left wrist splinted with brace.  Lymphadenopathy:     Cervical: No cervical adenopathy.  Skin:    General: Skin is warm and dry.     Capillary Refill: Capillary refill takes less than 2 seconds.  Neurological:     General: No focal deficit present.     Mental Status: He is alert and oriented to person, place, and time.     Motor: Weakness present.     Coordination: Coordination abnormal.  Psychiatric:        Mood and Affect: Mood normal.        Behavior: Behavior normal.     Labs reviewed: Basic Metabolic Panel: Recent Labs    07/11/18 0259 07/12/18 0353 08/01/18 0844  NA 137 135 141  K 3.6 3.9 4.3  CL 108 109 108  CO2 20* 19* 21  GLUCOSE 119* 100* 126*  BUN 16 13 10   CREATININE 1.29* 1.16 0.79  CALCIUM 7.8* 7.5* 9.5  MG  --  2.1  --   TSH  --  0.841  --    Liver Function Tests: Recent Labs    07/10/18 1130 07/11/18 0259 07/12/18 0353  AST 22 29 21   ALT 23 23 19   ALKPHOS 75 57 52  BILITOT 0.7 0.6 0.5  PROT 6.2* 4.4* 4.2*  ALBUMIN 3.3* 2.4* 2.3*   No results for input(s): LIPASE, AMYLASE in the last 8760 hours. No results for input(s): AMMONIA in the last 8760 hours. CBC: Recent Labs    07/08/18 1625 07/10/18 1130 07/10/18 2149 07/11/18 0259 07/12/18 0353  WBC 21.3* 9.5 19.8* 17.1*  14.7*  NEUTROABS 18,169* 7.0  --   --  13.0*  HGB 13.0* 12.2* 11.4* 10.9* 9.5*  HCT 38.4* 37.1* 34.0* 33.5* 28.9*  MCV 89.3 92.5 90.7 92.0 91.5  PLT 339 295 267 253 244   Lipid Panel: No results for  input(s): CHOL, HDL, LDLCALC, TRIG, CHOLHDL, LDLDIRECT in the last 8760 hours. Lab Results  Component Value Date   HGBA1C 8.1 (H) 08/01/2018    Procedures since last visit: No results found.  Assessment/Plan 1. Weakness - associated with lack of appetite - must rule out covid-19, blood loss, infection - calcium level- today - complete blood count with differential/ platelets  2. Loss of taste - patient has been social distancing, but has went to doctors appointments  - noval corona virus 19 nasal swab test   3. Right lower lobe pulmonary nodule -prior hospitalization showed 108mm nodule in right lower lobe - schedule follow up CT chest for September 2020  4. Paresthesia of hand, bilateral - ongoing, followed by Dr. Veverly Fells - could be increased with altered calcium level - calcium level- today  5. Primary hyperparathyoidism ([place]) - had left parathyroidectomy in 12/2016 with Dr. Harlow Asa  - no mass felt during exam - fatigue,weakness, altered appetite and taste, and hoarse voice could be contributing factors - calcium level - PTH level  6. Dyspnea on exertion - noval covid 19 nasal swab - patient was taking incorrect metoprolol medication - restart metoprolol tartate 25 mg - 1/2 tablet twice a day - no ankle edema, lung sounds clear - complete blood count with differential/platelets to rule out low h/h    Labs/tests ordered:  N[place] covid 19 nasal swab, complete blood count with differential/platelets, calcium level, PHT level,  Next appt:  12/03/2018

## 2018-09-05 ENCOUNTER — Other Ambulatory Visit: Payer: Self-pay | Admitting: Internal Medicine

## 2018-09-05 DIAGNOSIS — R7989 Other specified abnormal findings of blood chemistry: Secondary | ICD-10-CM

## 2018-09-05 DIAGNOSIS — D691 Qualitative platelet defects: Secondary | ICD-10-CM

## 2018-09-06 LAB — NOVEL CORONAVIRUS, NAA: SARS-CoV-2, NAA: NOT DETECTED

## 2018-09-08 LAB — CBC WITH DIFFERENTIAL/PLATELET
Absolute Monocytes: 616 cells/uL (ref 200–950)
Basophils Absolute: 39 cells/uL (ref 0–200)
Basophils Relative: 0.7 %
Eosinophils Absolute: 517 cells/uL — ABNORMAL HIGH (ref 15–500)
Eosinophils Relative: 9.4 %
HCT: 38.5 % (ref 38.5–50.0)
Hemoglobin: 13 g/dL — ABNORMAL LOW (ref 13.2–17.1)
Lymphs Abs: 715 cells/uL — ABNORMAL LOW (ref 850–3900)
MCH: 29.6 pg (ref 27.0–33.0)
MCHC: 33.8 g/dL (ref 32.0–36.0)
MCV: 87.7 fL (ref 80.0–100.0)
MPV: 12.5 fL (ref 7.5–12.5)
Monocytes Relative: 11.2 %
Neutro Abs: 3614 cells/uL (ref 1500–7800)
Neutrophils Relative %: 65.7 %
Platelets: 94 10*3/uL — ABNORMAL LOW (ref 140–400)
RBC: 4.39 10*6/uL (ref 4.20–5.80)
RDW: 12.9 % (ref 11.0–15.0)
Total Lymphocyte: 13 %
WBC: 5.5 10*3/uL (ref 3.8–10.8)

## 2018-09-08 LAB — BASIC METABOLIC PANEL
BUN: 13 mg/dL (ref 7–25)
CO2: 25 mmol/L (ref 20–32)
Calcium: 9.7 mg/dL (ref 8.6–10.3)
Chloride: 101 mmol/L (ref 98–110)
Creat: 0.82 mg/dL (ref 0.70–1.11)
Glucose, Bld: 199 mg/dL — ABNORMAL HIGH (ref 65–139)
Potassium: 4.4 mmol/L (ref 3.5–5.3)
Sodium: 136 mmol/L (ref 135–146)

## 2018-09-08 LAB — CALCIUM, IONIZED: Calcium, Ion: 5.2 mg/dL (ref 4.8–5.6)

## 2018-09-08 LAB — PTH, INTACT AND CALCIUM
Calcium: 9.7 mg/dL (ref 8.6–10.3)
PTH: 6 pg/mL — ABNORMAL LOW (ref 14–64)

## 2018-09-18 ENCOUNTER — Other Ambulatory Visit: Payer: Self-pay

## 2018-09-18 ENCOUNTER — Other Ambulatory Visit: Payer: Medicare Other

## 2018-09-18 DIAGNOSIS — R7989 Other specified abnormal findings of blood chemistry: Secondary | ICD-10-CM | POA: Diagnosis not present

## 2018-09-18 DIAGNOSIS — D691 Qualitative platelet defects: Secondary | ICD-10-CM | POA: Diagnosis not present

## 2018-09-18 LAB — CBC WITH DIFFERENTIAL/PLATELET
Absolute Monocytes: 920 cells/uL (ref 200–950)
Basophils Absolute: 72 cells/uL (ref 0–200)
Basophils Relative: 0.9 %
Eosinophils Absolute: 480 cells/uL (ref 15–500)
Eosinophils Relative: 6 %
HCT: 36.8 % — ABNORMAL LOW (ref 38.5–50.0)
Hemoglobin: 12.2 g/dL — ABNORMAL LOW (ref 13.2–17.1)
Lymphs Abs: 1312 cells/uL (ref 850–3900)
MCH: 30 pg (ref 27.0–33.0)
MCHC: 33.2 g/dL (ref 32.0–36.0)
MCV: 90.4 fL (ref 80.0–100.0)
MPV: 12.1 fL (ref 7.5–12.5)
Monocytes Relative: 11.5 %
Neutro Abs: 5216 cells/uL (ref 1500–7800)
Neutrophils Relative %: 65.2 %
Platelets: 218 10*3/uL (ref 140–400)
RBC: 4.07 10*6/uL — ABNORMAL LOW (ref 4.20–5.80)
RDW: 12.7 % (ref 11.0–15.0)
Total Lymphocyte: 16.4 %
WBC: 8 10*3/uL (ref 3.8–10.8)

## 2018-09-24 ENCOUNTER — Other Ambulatory Visit: Payer: Self-pay | Admitting: *Deleted

## 2018-09-24 DIAGNOSIS — G5602 Carpal tunnel syndrome, left upper limb: Secondary | ICD-10-CM

## 2018-09-24 DIAGNOSIS — G5622 Lesion of ulnar nerve, left upper limb: Secondary | ICD-10-CM

## 2018-09-24 MED ORDER — GABAPENTIN 300 MG PO CAPS: 300.0000 mg | ORAL_CAPSULE | Freq: Three times a day (TID) | ORAL | 1 refills | Status: DC

## 2018-09-24 NOTE — Telephone Encounter (Signed)
Patient requested and [facility] sent request.

## 2018-10-08 ENCOUNTER — Encounter: Payer: Medicare Other | Admitting: Neurology

## 2018-10-08 ENCOUNTER — Other Ambulatory Visit: Payer: Self-pay | Admitting: Internal Medicine

## 2018-10-08 DIAGNOSIS — R911 Solitary pulmonary nodule: Secondary | ICD-10-CM

## 2018-10-08 DIAGNOSIS — R918 Other nonspecific abnormal finding of lung field: Secondary | ICD-10-CM

## 2018-10-08 DIAGNOSIS — R531 Weakness: Secondary | ICD-10-CM

## 2018-10-16 DIAGNOSIS — Z23 Encounter for immunization: Secondary | ICD-10-CM | POA: Diagnosis not present

## 2018-10-17 DIAGNOSIS — H353233 Exudative age-related macular degeneration, bilateral, with inactive scar: Secondary | ICD-10-CM | POA: Diagnosis not present

## 2018-10-21 DIAGNOSIS — G5602 Carpal tunnel syndrome, left upper limb: Secondary | ICD-10-CM | POA: Diagnosis not present

## 2018-11-05 ENCOUNTER — Ambulatory Visit
Admission: RE | Admit: 2018-11-05 | Discharge: 2018-11-05 | Disposition: A | Discharge status: Home / Self Care | Payer: Medicare Other | Source: Ambulatory Visit | Attending: Internal Medicine | Admitting: Internal Medicine

## 2018-11-05 ENCOUNTER — Other Ambulatory Visit: Payer: Self-pay

## 2018-11-05 DIAGNOSIS — R918 Other nonspecific abnormal finding of lung field: Secondary | ICD-10-CM

## 2018-11-05 DIAGNOSIS — R531 Weakness: Secondary | ICD-10-CM

## 2018-11-05 DIAGNOSIS — R911 Solitary pulmonary nodule: Secondary | ICD-10-CM

## 2018-11-05 DIAGNOSIS — J9811 Atelectasis: Secondary | ICD-10-CM | POA: Diagnosis not present

## 2018-11-07 ENCOUNTER — Other Ambulatory Visit: Payer: Self-pay | Admitting: Internal Medicine

## 2018-11-07 DIAGNOSIS — R911 Solitary pulmonary nodule: Secondary | ICD-10-CM

## 2018-11-07 DIAGNOSIS — R918 Other nonspecific abnormal finding of lung field: Secondary | ICD-10-CM

## 2018-11-11 ENCOUNTER — Telehealth: Payer: Self-pay | Admitting: Internal Medicine

## 2018-11-11 NOTE — Telephone Encounter (Signed)
Pts wife called confused bc [facility] called to sch Micheal [name](her husband) another chest CT.   She said he just had chest CT on 11/05/18. She wanted to confirm with [place] before scheduling. I didn't notice that either order put in on 11/07/18 was different. They both stated WO contrast.  I told Micheal [name] someone would call to explain these orders.  Thanks, Vilinda Blanks

## 2018-11-12 NOTE — Telephone Encounter (Signed)
I ordered that as a future order very clearly in the referral--it's to be done in April.  Not only did I select the future date, but I also wrote it in the scheduling information.  The system did not allow me to order a f/u on nodule CT only the regular type of CT.

## 2018-11-14 NOTE — Telephone Encounter (Signed)
Sorry I'm learning how to update clinical info... Spoke with Ms Micheal [name] on 11/12/18 to let her know that the call she received from GI was for future CT order Dr Mariea Clonts placed to be done in April 2021 as 84mth follow up.  Thanks for your help, Vilinda Blanks

## 2018-11-14 NOTE — Telephone Encounter (Signed)
Patients wife aware of Dr.Reed's response. Patient states someone called and informed her of this already. I apologized for the duplicate call and inform her that due to a lack of documentation I was unaware that someone had already called, patient understood.  Patient aware I will also follow-up with [facility] to assure that they are aware that CT is to be scheduled for April 2021  I called [facility], spoke with Mardene Celeste and she will defer CT and call patient/family in April 2021 to schedule

## 2018-11-15 DIAGNOSIS — D692 Other nonthrombocytopenic purpura: Secondary | ICD-10-CM | POA: Diagnosis not present

## 2018-11-15 DIAGNOSIS — S81819A Laceration without foreign body, unspecified lower leg, initial encounter: Secondary | ICD-10-CM | POA: Diagnosis not present

## 2018-11-22 ENCOUNTER — Other Ambulatory Visit: Payer: Self-pay | Admitting: Internal Medicine

## 2018-11-27 DIAGNOSIS — M25512 Pain in left shoulder: Secondary | ICD-10-CM | POA: Diagnosis not present

## 2018-11-27 DIAGNOSIS — M170 Bilateral primary osteoarthritis of knee: Secondary | ICD-10-CM | POA: Diagnosis not present

## 2018-11-27 DIAGNOSIS — M1711 Unilateral primary osteoarthritis, right knee: Secondary | ICD-10-CM | POA: Diagnosis not present

## 2018-11-27 DIAGNOSIS — M1712 Unilateral primary osteoarthritis, left knee: Secondary | ICD-10-CM | POA: Diagnosis not present

## 2018-11-30 ENCOUNTER — Other Ambulatory Visit: Payer: Self-pay | Admitting: Internal Medicine

## 2018-11-30 DIAGNOSIS — G5602 Carpal tunnel syndrome, left upper limb: Secondary | ICD-10-CM

## 2018-11-30 DIAGNOSIS — G5622 Lesion of ulnar nerve, left upper limb: Secondary | ICD-10-CM

## 2018-12-03 ENCOUNTER — Other Ambulatory Visit: Payer: Medicare Other

## 2018-12-04 ENCOUNTER — Other Ambulatory Visit: Payer: Medicare Other

## 2018-12-04 ENCOUNTER — Other Ambulatory Visit: Payer: Self-pay | Admitting: Internal Medicine

## 2018-12-04 NOTE — Telephone Encounter (Signed)
Hold until A1c results are back to confirm effectiveness of medication and that provider will not adjust

## 2018-12-11 ENCOUNTER — Other Ambulatory Visit: Payer: Medicare Other

## 2018-12-15 ENCOUNTER — Ambulatory Visit: Payer: Medicare Other | Admitting: Internal Medicine

## 2018-12-24 ENCOUNTER — Other Ambulatory Visit: Payer: Medicare Other

## 2018-12-24 ENCOUNTER — Other Ambulatory Visit: Payer: Self-pay

## 2018-12-24 DIAGNOSIS — K5792 Diverticulitis of intestine, part unspecified, without perforation or abscess without bleeding: Secondary | ICD-10-CM | POA: Diagnosis not present

## 2018-12-24 DIAGNOSIS — D696 Thrombocytopenia, unspecified: Secondary | ICD-10-CM | POA: Diagnosis not present

## 2018-12-24 DIAGNOSIS — E876 Hypokalemia: Secondary | ICD-10-CM | POA: Diagnosis not present

## 2018-12-24 DIAGNOSIS — E1151 Type 2 diabetes mellitus with diabetic peripheral angiopathy without gangrene: Secondary | ICD-10-CM | POA: Diagnosis not present

## 2018-12-25 LAB — COMPLETE METABOLIC PANEL WITH GFR
AG Ratio: 2.4 (calc) (ref 1.0–2.5)
ALT: 14 U/L (ref 9–46)
AST: 13 U/L (ref 10–35)
Albumin: 4.3 g/dL (ref 3.6–5.1)
Alkaline phosphatase (APISO): 64 U/L (ref 35–144)
BUN: 15 mg/dL (ref 7–25)
CO2: 26 mmol/L (ref 20–32)
Calcium: 9.6 mg/dL (ref 8.6–10.3)
Chloride: 107 mmol/L (ref 98–110)
Creat: 1.02 mg/dL (ref 0.70–1.11)
GFR, Est African American: 76 mL/min/{1.73_m2} (ref >=60)
GFR, Est Non African American: 65 mL/min/{1.73_m2} (ref >=60)
Globulin: 1.8 g/dL (calc) — ABNORMAL LOW (ref 1.9–3.7)
Glucose, Bld: 147 mg/dL — ABNORMAL HIGH (ref 65–99)
Potassium: 4.2 mmol/L (ref 3.5–5.3)
Sodium: 141 mmol/L (ref 135–146)
Total Bilirubin: 0.5 mg/dL (ref 0.2–1.2)
Total Protein: 6.1 g/dL (ref 6.1–8.1)

## 2018-12-25 LAB — HEMOGLOBIN A1C
Hgb A1c MFr Bld: 10.2 % of total Hgb — ABNORMAL HIGH (ref <5.7)
Mean Plasma Glucose: 246 (calc)
eAG (mmol/L): 13.6 (calc)

## 2018-12-25 LAB — CBC WITH DIFFERENTIAL/PLATELET
Absolute Monocytes: 564 cells/uL (ref 200–950)
Basophils Absolute: 50 cells/uL (ref 0–200)
Basophils Relative: 0.8 %
Eosinophils Absolute: 118 cells/uL (ref 15–500)
Eosinophils Relative: 1.9 %
HCT: 40.8 % (ref 38.5–50.0)
Hemoglobin: 13.9 g/dL (ref 13.2–17.1)
Lymphs Abs: 1166 cells/uL (ref 850–3900)
MCH: 30.4 pg (ref 27.0–33.0)
MCHC: 34.1 g/dL (ref 32.0–36.0)
MCV: 89.3 fL (ref 80.0–100.0)
MPV: 11.4 fL (ref 7.5–12.5)
Monocytes Relative: 9.1 %
Neutro Abs: 4303 cells/uL (ref 1500–7800)
Neutrophils Relative %: 69.4 %
Platelets: 200 10*3/uL (ref 140–400)
RBC: 4.57 10*6/uL (ref 4.20–5.80)
RDW: 12.5 % (ref 11.0–15.0)
Total Lymphocyte: 18.8 %
WBC: 6.2 10*3/uL (ref 3.8–10.8)

## 2018-12-29 ENCOUNTER — Ambulatory Visit (INDEPENDENT_AMBULATORY_CARE_PROVIDER_SITE_OTHER): Payer: Medicare Other | Admitting: Internal Medicine

## 2018-12-29 ENCOUNTER — Other Ambulatory Visit: Payer: Self-pay

## 2018-12-29 ENCOUNTER — Other Ambulatory Visit: Payer: Medicare Other

## 2018-12-29 ENCOUNTER — Encounter: Payer: Self-pay | Admitting: Internal Medicine

## 2018-12-29 VITALS — BP 132/80 | HR 95 | Temp 97.3°F | Ht 67.0 in | Wt 146.8 lb

## 2018-12-29 DIAGNOSIS — G5622 Lesion of ulnar nerve, left upper limb: Secondary | ICD-10-CM | POA: Diagnosis not present

## 2018-12-29 DIAGNOSIS — G5602 Carpal tunnel syndrome, left upper limb: Secondary | ICD-10-CM

## 2018-12-29 DIAGNOSIS — R911 Solitary pulmonary nodule: Secondary | ICD-10-CM | POA: Diagnosis not present

## 2018-12-29 DIAGNOSIS — M159 Polyosteoarthritis, unspecified: Secondary | ICD-10-CM

## 2018-12-29 DIAGNOSIS — E1165 Type 2 diabetes mellitus with hyperglycemia: Secondary | ICD-10-CM

## 2018-12-29 DIAGNOSIS — R202 Paresthesia of skin: Secondary | ICD-10-CM | POA: Diagnosis not present

## 2018-12-29 DIAGNOSIS — E1151 Type 2 diabetes mellitus with diabetic peripheral angiopathy without gangrene: Secondary | ICD-10-CM

## 2018-12-29 DIAGNOSIS — Z8546 Personal history of malignant neoplasm of prostate: Secondary | ICD-10-CM | POA: Diagnosis not present

## 2018-12-29 MED ORDER — METOPROLOL TARTRATE 25 MG PO TABS: 12.5000 mg | ORAL_TABLET | Freq: Two times a day (BID) | ORAL | 1 refills | Status: DC

## 2018-12-29 MED ORDER — LANTUS SOLOSTAR 100 UNIT/ML ~~LOC~~ SOPN: PEN_INJECTOR | SUBCUTANEOUS | 1 refills | Status: DC

## 2018-12-29 MED ORDER — GABAPENTIN 300 MG PO CAPS: 300.0000 mg | ORAL_CAPSULE | Freq: Three times a day (TID) | ORAL | 1 refills | Status: DC

## 2018-12-29 NOTE — Progress Notes (Signed)
Location:  [facility] clinic  Provider: Dr. Hollace Kinnier   Advanced Directives 12/29/2018  Does Patient Have a Medical Advance Directive? Yes  Type of Advance Directive Living will  Does patient want to make changes to medical advance directive? No - Patient declined  Copy of [place] in Chart? -  Would patient like information on creating a medical advance directive? -     Chief Complaint  Patient presents with  . Medical Management of Chronic Issues    4 month followup  . Quality Metric Gaps    Has an appointment with Dr. Garwin Brothers at [facility] 04/17/19 for ophthalmology exam    HPI: Patient is a 85 y.o. male seen today for medical management of chronic diseases.    He states he is doing well.   He had steroid injection in his left shoulder a few months ago. States the pain has decreased and has increased movement.   He is trying to walk a few times a week. At times he will walk 2 miles. When he walks his knees bother him. He has started taking a cane with him when he walks.  He sees a specialist and has been told he cannot have surgery due to his blood glucose.   Denies any recent falls or injuries.   Proud he has been married 72 years at the end of this month.   States he just had his eyes checked in the last few months.   He eats 2-3 meals a day. He checks his sugars daily. At times his sugar is over 200. States they average between 150-200. Denies eating a lot of sweets. Admits to eating sandwich bread or cereal. He drinks coffee in the AM, diet coke at lunch, and and a couple glasses of water during the day.   He just got a new glucometer. Concerned he gets two different readings.   Fingers on the left hand get numb. He has been diagnosed with carpal tunnel by orthopedics. At this time he is going to live with the paresthesia . He is not interested in surgery at this time. Claims the gabapentin helps with symptoms.   Concerned about the pulmonary nodule found  earlier this year. He had a CT of the chest done in October. The nodule had grown in size. He is worried his previous prostate cancer will turn into lunch cancer. He is scheduled to have a follow up CT of chest in April 2021.   No new urinary complaints. Takes his flomax daily. Complains of urgency, denies dysuria or hematuria.      Past Medical History:  Diagnosis Date  . Allergic rhinitis, cause unspecified   . Carpal tunnel syndrome   . Cervical spondylosis without myelopathy   . Cervical spondylosis without myelopathy   . Cervicalgia   . Diverticulosis of colon (without mention of hemorrhage)   . Esophageal reflux   . Hypertrophy of prostate with urinary obstruction and other lower urinary tract symptoms (LUTS)   . Insomnia, unspecified   . Internal hemorrhoids without mention of complication   . Irritable bowel syndrome   . Macular degeneration (senile) of retina, unspecified   . Malignant neoplasm of prostate ([place])   . Neurogenic bladder, NOS   . Neuropathy   . Nonspecific (abnormal) findings on radiological and other examination of gastrointestinal tract   . Orthostatic hypotension   . Osteoarthrosis, unspecified whether generalized or localized, unspecified site   . Other and unspecified hyperlipidemia   .  Other malaise and fatigue   . Other specified disorder of rectum and anus    Radiation Proctitus  . Paroxysmal supraventricular tachycardia ([place])   . Restless legs syndrome (RLS)   . Retinal detachment with retinal defect, unspecified   . Right bundle branch block    Incomplete  . Spermatocele    Right  . Trigger finger (acquired)   . Type II or unspecified type diabetes mellitus without mention of complication, not stated as uncontrolled   . Type II or unspecified type diabetes mellitus without mention of complication, uncontrolled   . Unspecified essential hypertension    patient denies, states he has a "fast heart rate, but no high BP"  . Vitamin D deficiency      Past Surgical History:  Procedure Laterality Date  . Angiolipoma  1980   Right arm  . APPENDECTOMY  1951  . CHOLECYSTECTOMY  01/1994  . COLONOSCOPY  04/11/2010  . COLONOSCOPY W/ POLYPECTOMY  2005   Removed 2 (two) polyps  . DUPUYTREN CONTRACTURE RELEASE Bilateral 1989  . EYE SURGERY  2006   Left  . EYE SURGERY  2006   Retina reattachment, right  . EYE SURGERY  02/1994   Cataract surgery, right  . EYE SURGERY  01/1995   Cataract surgery, left  . KNEE SURGERY  1979  . PARATHYROIDECTOMY Left 12/27/2016   Procedure: LEFT INFERIOR PARATHYROIDECTOMY;  Surgeon: Armandina Gemma, MD;  Location: [facility];  Service: General;  Laterality: Left;  . POLYPECTOMY  1955   Vocal cord  . RETINAL DETACHMENT SURGERY Right 2005  . SHOULDER ARTHROSCOPY Right 03/22/15   [facility]  . TENDON RELEASE  01/1997   seven fingers  . TRANSURETHRAL RESECTION OF PROSTATE  08/1989  . VITRECTOMY Left 04/23/2013   [facility]    Allergies  Allergen Reactions  . Aspirin Other (See Comments)    Bothers stomach, thins blood   . Atorvastatin Other (See Comments)    Muscular pain and weakness  . Prednisone Other (See Comments)    Upset stomach, can take by shot not by mouth  . Simvastatin Other (See Comments)    Muscular pain and weakness  . Sulfa Antibiotics Other (See Comments)    Upset stomach   . Latex Rash  . Metformin Hcl Nausea Only  . Voltaren [Diclofenac Sodium] Other (See Comments)    Unknown    Outpatient Encounter Medications as of 12/29/2018  Medication Sig  . acetaminophen (TYLENOL) 500 MG tablet Take 1,000 mg by mouth 3 (three) times daily.  Marland Kitchen alum & mag hydroxide-simeth (MAALOX/MYLANTA) 200-200-20 MG/5ML suspension Take 30 mLs by mouth as needed for indigestion or heartburn.  . cholecalciferol (VITAMIN D) 400 units TABS tablet Take 400 Units by mouth daily.  Marland Kitchen docusate sodium (COLACE) 100 MG capsule Take 100 mg by mouth daily as needed.   . famotidine (PEPCID AC) 10 MG chewable tablet Chew 10  mg by mouth daily as needed for heartburn.  . gabapentin (NEURONTIN) 300 MG capsule TAKE 1 CAPSULE BY MOUTH THREE TIMES DAILY  . Insulin Glargine (LANTUS SOLOSTAR) 100 UNIT/ML Solostar Pen INJECT SUBCUTANEOUSLY 45  UNITS IN THE MORNING  . Insulin Pen Needle (B-D ULTRAFINE III SHORT PEN) 31G X 8 MM MISC Use once daily for insulin  . loperamide (IMODIUM) 1 MG/5ML solution Take 1 mg by mouth as needed for diarrhea or loose stools.  Marland Kitchen loratadine (CLARITIN) 10 MG tablet Take 10 mg by mouth daily as needed.   . metoprolol tartrate (LOPRESSOR)  25 MG tablet Take 0.5 tablets (12.5 mg total) by mouth 2 (two) times daily.  . ondansetron (ZOFRAN) 4 MG tablet Take 1 tablet (4 mg total) by mouth every 6 (six) hours as needed for nausea.  . Probiotic Product (PROBIOTIC-10) CHEW Chew 1 each by mouth daily.   . tamsulosin (FLOMAX) 0.4 MG CAPS capsule TAKE 1 CAPSULE BY MOUTH  DAILY FOR PROSTATE  . traMADol (ULTRAM) 50 MG tablet Take 1 tablet (50 mg total) by mouth every 6 (six) hours as needed for moderate pain.  Marland Kitchen trolamine salicylate (ASPERCREME) 10 % cream Apply 1 application topically as needed for muscle pain.   No facility-administered encounter medications on file as of 12/29/2018.     Review of Systems:  Review of Systems  Constitutional: Negative for activity change, appetite change and fatigue.  HENT: Negative for dental problem, hearing loss and trouble swallowing.        Hearing aid in left ear  Eyes: Negative for photophobia and visual disturbance.  Respiratory: Negative for cough, shortness of breath and wheezing.   Cardiovascular: Negative for chest pain and leg swelling.  Gastrointestinal: Negative for abdominal pain, constipation, diarrhea and nausea.  Endocrine: Negative for polydipsia, polyphagia and polyuria.  Genitourinary: Positive for urgency. Negative for dysuria and hematuria.  Musculoskeletal: Positive for arthralgias and myalgias.       Left shoulder pain, left wrist numbness,  bilateral knee pain  Skin: Negative.   Neurological: Negative for dizziness, syncope and headaches.  Psychiatric/Behavioral: Negative for dysphoric mood and sleep disturbance. The patient is not nervous/anxious.   All other systems reviewed and are negative.   Health Maintenance  Topic Date Due  . INFLUENZA VACCINE  08/23/2018  . OPHTHALMOLOGY EXAM  08/27/2018  . URINE MICROALBUMIN  12/31/2018  . FOOT EXAM  04/03/2019  . HEMOGLOBIN A1C  06/24/2019  . TETANUS/TDAP  06/28/2028  . PNA vac Low Risk Adult  Completed    Physical Exam: Vitals:   12/29/18 0847  BP: 132/80  Pulse: 95  Temp: (!) 97.3 F (36.3 C)  TempSrc: Temporal  SpO2: 96%  Weight: 146 lb 12.8 oz (66.6 kg)  Height: 5\' 7"  (1.702 m)   Body mass index is 22.99 kg/m. Physical Exam Vitals signs reviewed.  Constitutional:      Appearance: Normal appearance. He is normal weight.  HENT:     Head: Normocephalic.  Cardiovascular:     Rate and Rhythm: Normal rate and regular rhythm.     Pulses: Normal pulses.     Heart sounds: Normal heart sounds. No murmur.  Pulmonary:     Effort: Pulmonary effort is normal. No respiratory distress.     Breath sounds: Normal breath sounds. No wheezing.  Abdominal:     General: Abdomen is flat. Bowel sounds are normal.     Palpations: Abdomen is soft.  Musculoskeletal:        General: No swelling or tenderness.     Right lower leg: No edema.     Left lower leg: No edema.  Skin:    General: Skin is warm and dry.     Capillary Refill: Capillary refill takes less than 2 seconds.  Neurological:     General: No focal deficit present.     Mental Status: He is alert and oriented to person, place, and time. Mental status is at baseline.  Psychiatric:        Mood and Affect: Mood normal.        Behavior: Behavior normal.  Thought Content: Thought content normal.        Judgment: Judgment normal.     Labs reviewed: Basic Metabolic Panel: Recent Labs    07/12/18 0353  08/01/18 0844 09/04/18 1437 12/24/18 0906  NA 135 141 136 141  K 3.9 4.3 4.4 4.2  CL 109 108 101 107  CO2 19* 21 25 26   GLUCOSE 100* 126* 199* 147*  BUN 13 10 13 15   CREATININE 1.16 0.79 0.82 1.02  CALCIUM 7.5* 9.5 9.7  9.7 9.6  MG 2.1  --   --   --   TSH 0.841  --   --   --    Liver Function Tests: Recent Labs    07/10/18 1130 07/11/18 0259 07/12/18 0353 12/24/18 0906  AST 22 29 21 13   ALT 23 23 19 14   ALKPHOS 75 57 52  --   BILITOT 0.7 0.6 0.5 0.5  PROT 6.2* 4.4* 4.2* 6.1  ALBUMIN 3.3* 2.4* 2.3*  --    No results for input(s): LIPASE, AMYLASE in the last 8760 hours. No results for input(s): AMMONIA in the last 8760 hours. CBC: Recent Labs    09/04/18 1437 09/18/18 1106 12/24/18 0906  WBC 5.5 8.0 6.2  NEUTROABS 3,614 5,216 4,303  HGB 13.0* 12.2* 13.9  HCT 38.5 36.8* 40.8  MCV 87.7 90.4 89.3  PLT 94* 218 200   Lipid Panel: No results for input(s): CHOL, HDL, LDLCALC, TRIG, CHOLHDL, LDLDIRECT in the last 8760 hours. Lab Results  Component Value Date   HGBA1C 10.2 (H) 12/24/2018    Procedures since last visit: No results found.  Assessment/Plan 1. Right lower lobe pulmonary nodule - CT of chest done in October suggested follow up CT of chest in 6 months - Orders placed for follow up CT of chest without contrast  2. History of prostate cancer - stable at this time - continue flomax at current regimen  3. DM (diabetes mellitus) type 2 with peripheral vascular complications - Hemoglobin A1C elevated from last visit - suspect patient is eating a high carb diet at times due to weight loss earlier this year - continue to check blood sugar daily - Will increase Lantus to 48 units subcutaneously every morning  4. Paresthesia of hand, bilateral - followed by orthopedics - not a candidate for surgery at this time  - continue to use gabapentin  5. Generalized osteoarthritis of multiple sites - stable at this time, left shoulder pain improved after  steroid injection - followed by Dr. Veverly Fells - continue to take tylenol 1000 mg three times daily as needed for pain  6. Hyperglycemia due to diabetes mellitus ([place]) - continue to check blood sugars daily - limit sugars and carbs from diet - hemoglobin A1C- future - complete metabolic panel- future   Labs/tests ordered:  Hemoglobin A1C, complete metabolic panel with GFR- future Next appt:  03/10/2019

## 2018-12-29 NOTE — Patient Instructions (Addendum)
Increase your lantus to 48 units. Keep up the good work with cutting down on the sweets and bread to get your sugars down.  Let me know if the sugars get low.    We will get that CT of your lungs in April to look at that growing nodule.    I will wait to see what urology says your PSA is and how you're doing there.

## 2019-01-01 DIAGNOSIS — C61 Malignant neoplasm of prostate: Secondary | ICD-10-CM | POA: Diagnosis not present

## 2019-02-13 ENCOUNTER — Other Ambulatory Visit: Payer: Self-pay

## 2019-02-13 ENCOUNTER — Encounter: Payer: Self-pay | Admitting: Family

## 2019-02-13 ENCOUNTER — Ambulatory Visit (INDEPENDENT_AMBULATORY_CARE_PROVIDER_SITE_OTHER): Payer: Medicare Other | Admitting: Family

## 2019-02-13 DIAGNOSIS — S81811A Laceration without foreign body, right lower leg, initial encounter: Secondary | ICD-10-CM | POA: Diagnosis not present

## 2019-02-13 MED ORDER — MUPIROCIN 2 % EX OINT: TOPICAL_OINTMENT | CUTANEOUS | 0 refills | Status: AC

## 2019-02-13 NOTE — Progress Notes (Signed)
Patient ID: Micheal [name], male   DOB: 18-Feb-1928, 86 y.o.   MRN: [id number] This service is provided via telemedicine  No vital signs collected/recorded due to the encounter was a telemedicine visit.   Location of patient (ex: home, work):  HOME  Patient consents to a telephone visit:  YES  Location of the provider (ex: office, home):  OFFICE  Name of any referring provider:  TIFFANY REED, DO  Names of all persons participating in the telemedicine service and their role in the encounter:  PATIENT, Micheal [name], Micheal [name], Micheal Sax, NP  Time spent on call:  6:25   Provider: Jalaya Sarver FNP-C  Gayland Curry, DO  Patient Care Team: Gayland Curry, DO as PCP - General (Geriatric Medicine) Netta Cedars, MD as Consulting Physician (Orthopedic Surgery) Jasmine Awe, MD as Referring Physician (Ophthalmology) Rana Snare, MD as Consulting Physician (Urology)  Extended Emergency Contact Information Primary Emergency Contact: Arna Snipe [place] Phone: [phone] Work Phone: [phone] Mobile Phone: [phone] Relation: Daughter Secondary Emergency Contact: Leitzke,Evelyn Address: [place], [place] Phone: [phone] Relation: Spouse  Code Status:  Full Code  Goals of care: Advanced Directive information Advanced Directives 12/29/2018  Does Patient Have a Medical Advance Directive? Yes  Type of Advance Directive Living will  Does patient want to make changes to medical advance directive? No - Patient declined  Copy of [place] in Chart? -  Would patient like information on creating a medical advance directive? -     Chief Complaint  Patient presents with  . Acute Visit    Sore on leg    HPI:  Pt is a 86 y.o. male seen today for an acute visit for evaluation of right leg sore x 2 weeks.Patient heard of hearing spoke with wife  present during visit .States might have accidentally hit the corner of the coffee tablet scraping off the skin.Has noticed blood on the sore.she describe wound bed as red.seems to be healing.Has not noticed any redness on surrounding skin,no yellow drainage and not painful.Request mupirocin states has worked for him in the past.Blood sugars has been in the 120's.   Past Medical History:  Diagnosis Date  . Allergic rhinitis, cause unspecified   . Carpal tunnel syndrome   . Cervical spondylosis without myelopathy   . Cervical spondylosis without myelopathy   . Cervicalgia   . Diverticulosis of colon (without mention of hemorrhage)   . Esophageal reflux   . Hypertrophy of prostate with urinary obstruction and other lower urinary tract symptoms (LUTS)   . Insomnia, unspecified   . Internal hemorrhoids without mention of complication   . Irritable bowel syndrome   . Macular degeneration (senile) of retina, unspecified   . Malignant neoplasm of prostate ([place])   . Neurogenic bladder, NOS   . Neuropathy   . Nonspecific (abnormal) findings on radiological and other examination of gastrointestinal tract   . Orthostatic hypotension   . Osteoarthrosis, unspecified whether generalized or localized, unspecified site   . Other and unspecified hyperlipidemia   . Other malaise and fatigue   . Other specified disorder of rectum and anus    Radiation Proctitus  . Paroxysmal supraventricular tachycardia ([place])   . Restless legs syndrome (RLS)   . Retinal detachment with retinal defect, unspecified   . Right bundle branch block  Incomplete  . Spermatocele    Right  . Trigger finger (acquired)   . Type II or unspecified type diabetes mellitus without mention of complication, not stated as uncontrolled   . Type II or unspecified type diabetes mellitus without mention of complication, uncontrolled   . Unspecified essential hypertension    patient denies, states he has a "fast heart rate, but no high  BP"  . Vitamin D deficiency    Past Surgical History:  Procedure Laterality Date  . Angiolipoma  1980   Right arm  . APPENDECTOMY  1951  . CHOLECYSTECTOMY  01/1994  . COLONOSCOPY  04/11/2010  . COLONOSCOPY W/ POLYPECTOMY  2005   Removed 2 (two) polyps  . DUPUYTREN CONTRACTURE RELEASE Bilateral 1989  . EYE SURGERY  2006   Left  . EYE SURGERY  2006   Retina reattachment, right  . EYE SURGERY  02/1994   Cataract surgery, right  . EYE SURGERY  01/1995   Cataract surgery, left  . KNEE SURGERY  1979  . PARATHYROIDECTOMY Left 12/27/2016   Procedure: LEFT INFERIOR PARATHYROIDECTOMY;  Surgeon: Armandina Gemma, MD;  Location: [facility];  Service: General;  Laterality: Left;  . POLYPECTOMY  1955   Vocal cord  . RETINAL DETACHMENT SURGERY Right 2005  . SHOULDER ARTHROSCOPY Right 03/22/15   [facility]  . TENDON RELEASE  01/1997   seven fingers  . TRANSURETHRAL RESECTION OF PROSTATE  08/1989  . VITRECTOMY Left 04/23/2013   [facility]    Allergies  Allergen Reactions  . Aspirin Other (See Comments)    Bothers stomach, thins blood   . Atorvastatin Other (See Comments)    Muscular pain and weakness  . Prednisone Other (See Comments)    Upset stomach, can take by shot not by mouth  . Simvastatin Other (See Comments)    Muscular pain and weakness  . Sulfa Antibiotics Other (See Comments)    Upset stomach   . Latex Rash  . Metformin Hcl Nausea Only  . Voltaren [Diclofenac Sodium] Other (See Comments)    Unknown    Outpatient Encounter Medications as of 02/13/2019  Medication Sig  . acetaminophen (TYLENOL) 500 MG tablet Take 1,000 mg by mouth 3 (three) times daily.  Marland Kitchen alum & mag hydroxide-simeth (MAALOX/MYLANTA) 200-200-20 MG/5ML suspension Take 30 mLs by mouth as needed for indigestion or heartburn.  . cholecalciferol (VITAMIN D) 400 units TABS tablet Take 400 Units by mouth daily.  Marland Kitchen docusate sodium (COLACE) 100 MG capsule Take 100 mg by mouth daily as needed.   . famotidine (PEPCID AC)  10 MG chewable tablet Chew 10 mg by mouth daily as needed for heartburn.  . gabapentin (NEURONTIN) 300 MG capsule Take 1 capsule (300 mg total) by mouth 3 (three) times daily.  . Insulin Glargine (LANTUS SOLOSTAR) 100 UNIT/ML Solostar Pen INJECT SUBCUTANEOUSLY 48  UNITS IN THE MORNING  . Insulin Pen Needle (B-D ULTRAFINE III SHORT PEN) 31G X 8 MM MISC Use once daily for insulin  . loperamide (IMODIUM) 1 MG/5ML solution Take 1 mg by mouth as needed for diarrhea or loose stools.  Marland Kitchen loratadine (CLARITIN) 10 MG tablet Take 10 mg by mouth daily as needed.   . metoprolol tartrate (LOPRESSOR) 25 MG tablet Take 0.5 tablets (12.5 mg total) by mouth 2 (two) times daily.  . Probiotic Product (PROBIOTIC-10) CHEW Chew 1 each by mouth daily.   . tamsulosin (FLOMAX) 0.4 MG CAPS capsule TAKE 1 CAPSULE BY MOUTH  DAILY FOR PROSTATE  . traMADol (  ULTRAM) 50 MG tablet Take 1 tablet (50 mg total) by mouth every 6 (six) hours as needed for moderate pain.  Marland Kitchen trolamine salicylate (ASPERCREME) 10 % cream Apply 1 application topically as needed for muscle pain.  . [DISCONTINUED] ondansetron (ZOFRAN) 4 MG tablet Take 1 tablet (4 mg total) by mouth every 6 (six) hours as needed for nausea.   No facility-administered encounter medications on file as of 02/13/2019.    Review of Systems  Unable to perform ROS: Other (additional HPI provided by patient's wife patient HOH  )  Constitutional: Negative for appetite change, chills, fatigue and fever.  HENT: Positive for hearing loss. Negative for rhinorrhea, sinus pressure, sinus pain, sneezing and sore throat.   Respiratory: Negative for cough, chest tightness, shortness of breath and wheezing.   Cardiovascular: Negative for chest pain, palpitations and leg swelling.  Skin: Negative for color change, pallor and rash.       Right leg sore per HPI   Neurological: Negative for dizziness, weakness, light-headedness, numbness and headaches.  Psychiatric/Behavioral: Negative for  agitation, confusion and sleep disturbance. The patient is not nervous/anxious.     Immunization History  Administered Date(s) Administered  . Influenza, High Dose Seasonal PF 10/19/2016, 11/01/2017, 10/16/2018  . Influenza,inj,Quad PF,6+ Mos 10/29/2012, 10/28/2013  . Influenza-Unspecified 10/25/2010, 11/07/2011, 10/07/2015  . Pneumococcal Conjugate-13 05/18/2014  . Pneumococcal Polysaccharide-23 01/23/2007  . Td 08/23/2010  . Tdap 06/29/2018   Pertinent  Health Maintenance Due  Topic Date Due  . OPHTHALMOLOGY EXAM  08/27/2018  . URINE MICROALBUMIN  12/31/2018  . FOOT EXAM  04/03/2019  . HEMOGLOBIN A1C  06/24/2019  . INFLUENZA VACCINE  Completed  . PNA vac Low Risk Adult  Completed   Fall Risk  02/13/2019 12/29/2018 09/04/2018 08/14/2018 07/16/2018  Falls in the past year? 0 0 0 0 0  Number falls in past yr: 0 - 0 0 0  Injury with Fall? 0 - 0 0 0  Risk for fall due to : - - - - -    There were no vitals filed for this visit. There is no height or weight on file to calculate BMI. Physical Exam Unable to complete on telephone visit.  Labs reviewed: Recent Labs    07/08/18 1625 07/12/18 0353 08/01/18 0844 09/04/18 1437 12/24/18 0906  NA   < > 135 141 136 141  K   < > 3.9 4.3 4.4 4.2  CL   < > 109 108 101 107  CO2   < > 19* 21 25 26   GLUCOSE   < > 100* 126* 199* 147*  BUN   < > 13 10 13 15   CREATININE   < > 1.16 0.79 0.82 1.02  CALCIUM   < > 7.5* 9.5 9.7  9.7 9.6  MG  --  2.1  --   --   --    < > = values in this interval not displayed.   Recent Labs    07/10/18 1130 07/10/18 1130 07/11/18 0259 07/12/18 0353 12/24/18 0906  AST 22   < > 29 21 13   ALT 23   < > 23 19 14   ALKPHOS 75  --  57 52  --   BILITOT 0.7   < > 0.6 0.5 0.5  PROT 6.2*   < > 4.4* 4.2* 6.1  ALBUMIN 3.3*  --  2.4* 2.3*  --    < > = values in this interval not displayed.   Recent Labs    09/04/18  1437 09/18/18 1106 12/24/18 0906  WBC 5.5 8.0 6.2  NEUTROABS 3,614 5,216 4,303  HGB 13.0*  12.2* 13.9  HCT 38.5 36.8* 40.8  MCV 87.7 90.4 89.3  PLT 94* 218 200   Lab Results  Component Value Date   TSH 0.841 07/12/2018   Lab Results  Component Value Date   HGBA1C 10.2 (H) 12/24/2018   Lab Results  Component Value Date   CHOL 251 (H) 08/15/2017   HDL 38 (L) 08/15/2017   LDLCALC 171 (H) 08/15/2017   TRIG 256 (H) 08/15/2017   CHOLHDL 6.6 (H) 08/15/2017    Significant Diagnostic Results in last 30 days:  No results found.  Assessment/Plan   Noninfected skin tear of right lower extremity, initial encounter Reports no fever.small amount blood reported.No signs of infection. - Advised to cleanse skin tear with warm water and soap,pat dry then apply mupirocin ointment and cover with a band Aid.Change dressing daily.  - Notify provider for any fever > 100,chills,swelling,redness around the skin tear or yellow drainage. - recommend follow up at the office if skin tear not healed in 1-2 weeks. Education information provided on AVS today.  Family/ staff Communication: Reviewed plan of care with patient and wife   Labs/tests ordered: None   Next Appointment: As needed if skin tear not healed or symptoms worsen.   Spent 11 minutes of non-face to face with patient   Sandrea Hughs, NP

## 2019-02-13 NOTE — Patient Instructions (Addendum)
-  cleanse skin tear with warm water and soap,pat dry then apply mupirocin ointment and cover with a band Aid.Change dressing daily.  - Notify provider for any fever > 100,chills,swelling,redness around the skin tear or yellow drainage. - recommend follow up at the office if skin tear not healed in 1-2 weeks.  Skin Tear A skin tear is a wound in which the top layers of skin peel off. This is a common problem for older people. It can also be a problem for people who take certain medicines for too long. To repair the skin, your doctor may use:  Tape.  Skin tape (adhesive) strips. A bandage (dressing) may also be placed over the tape or skin tape strips. Follow these instructions at home: Wound care   Clean the wound as told by your doctor. You may be told to keep the wound dry for the first few days. If you are told to clean the wound: ? Wash the wound with mild soap and water, a wound cleanser, or a salt-water (saline) solution. ? If you use soap, rinse the wound with water to remove all soap. ? Do not rub the wound dry. Pat the wound gently, or let it air dry. ? Keep the bandage dry as told by your doctor.  Change any bandage as told by your doctor. This includes changing the bandage if it gets wet, gets dirty, or starts to smell bad. To change your bandage: ? Wash your hands with soap and water before and after you change your bandage. If you cannot use soap and water, use hand sanitizer. ? Leave tape or skin tape strips in place. They may need to stay in place for 2 weeks or longer. If tape strips get loose and curl up, you may trim the loose edges. Do not remove tape strips completely unless your doctor says it is okay.  Check your wound every day for signs of infection. Check for: ? Redness, swelling, or pain. ? More fluid or blood. ? Warmth. ? Pus or a bad smell.  Do not scratch or pick at the wound.  Protect the injured area until it has healed. Medicines  Take or apply  over-the-counter and prescription medicines only as told by your doctor.  If you were prescribed an antibiotic medicine, take or apply it as told by your doctor. Do not stop using the antibiotic even if your condition gets better. General instructions   Keep the bandage dry as told by your doctor.  Do not take baths, swim, use a hot tub, or do anything that puts your wound underwater until your doctor approves. Ask your doctor if you may take showers. You may only be allowed to take sponge baths.  Keep all follow-up visits as told by your doctor. This is important. Contact a doctor if:  You have redness, swelling, or pain around your wound.  You have more fluid or blood coming from your wound.  Your wound feels warm to the touch.  You have pus or a bad smell coming from your wound. Get help right away if:  You have a red streak that goes away from the skin tear.  You have a fever and chills, and your symptoms get worse all of a sudden. Summary  A skin tear is a wound in which the top layers of skin peel off.  To repair the skin, your doctor may use tape or skin tape strips.  Change any bandage as told by your doctor.  Take or apply over-the-counter and prescription medicines only as told by your doctor.  Contact a doctor if you have signs of infection. This information is not intended to replace advice given to you by your health care provider. Make sure you discuss any questions you have with your health care provider. Document Revised: 05/01/2018 Document Reviewed: 10/29/2017 Elsevier Patient Education  [place].

## 2019-03-04 DIAGNOSIS — Z20828 Contact with and (suspected) exposure to other viral communicable diseases: Secondary | ICD-10-CM | POA: Diagnosis not present

## 2019-03-04 DIAGNOSIS — J3489 Other specified disorders of nose and nasal sinuses: Secondary | ICD-10-CM | POA: Diagnosis not present

## 2019-03-10 ENCOUNTER — Encounter: Payer: Medicare Other | Admitting: Family

## 2019-03-16 ENCOUNTER — Other Ambulatory Visit: Payer: Self-pay | Admitting: *Deleted

## 2019-03-16 DIAGNOSIS — E1151 Type 2 diabetes mellitus with diabetic peripheral angiopathy without gangrene: Secondary | ICD-10-CM

## 2019-03-16 MED ORDER — BD PEN NEEDLE SHORT U/F 31G X 8 MM MISC: 3 refills | Status: DC

## 2019-03-16 NOTE — Telephone Encounter (Signed)
[facility] 

## 2019-03-22 ENCOUNTER — Ambulatory Visit: Payer: Medicare Other | Attending: Internal Medicine

## 2019-03-22 DIAGNOSIS — Z23 Encounter for immunization: Secondary | ICD-10-CM | POA: Insufficient documentation

## 2019-03-22 NOTE — Progress Notes (Signed)
   Covid-19 Vaccination Clinic  Name:  Micheal [name]    MRN: [id number] DOB: Oct 14, 1928  03/22/2019  Mr. Getz was observed post Covid-19 immunization for 30 minutes based on pre-vaccination screening without incidence. He was provided with Vaccine Information Sheet and instruction to access the V-Safe system.   Mr. Wilner was instructed to call 911 with any severe reactions post vaccine: Marland Kitchen Difficulty breathing  . Swelling of your face and throat  . A fast heartbeat  . A bad rash all over your body  . Dizziness and weakness    Immunizations Administered    Name Date Dose VIS Date Route   Pfizer COVID-19 Vaccine 03/22/2019  8:53 AM 0.3 mL 01/02/2019 Intramuscular   Manufacturer: [place]   Lot: [id number]   [place]: [id number]

## 2019-03-23 DIAGNOSIS — M170 Bilateral primary osteoarthritis of knee: Secondary | ICD-10-CM | POA: Diagnosis not present

## 2019-03-23 DIAGNOSIS — M1711 Unilateral primary osteoarthritis, right knee: Secondary | ICD-10-CM | POA: Diagnosis not present

## 2019-03-23 DIAGNOSIS — M1712 Unilateral primary osteoarthritis, left knee: Secondary | ICD-10-CM | POA: Diagnosis not present

## 2019-04-15 ENCOUNTER — Ambulatory Visit: Payer: Medicare Other | Attending: Internal Medicine

## 2019-04-15 DIAGNOSIS — Z23 Encounter for immunization: Secondary | ICD-10-CM

## 2019-04-15 NOTE — Progress Notes (Signed)
   Covid-19 Vaccination Clinic  Name:  Micheal [name]    MRN: [id number] DOB: Sep 04, 1928  04/15/2019  [name] was observed post Covid-19 immunization for 30 minutes based on pre-vaccination screening without incident. He was provided with Vaccine Information Sheet and instruction to access the V-Safe system.   [name] was instructed to call 911 with any severe reactions post vaccine: Marland Kitchen Difficulty breathing  . Swelling of face and throat  . A fast heartbeat  . A bad rash all over body  . Dizziness and weakness   Immunizations Administered    Name Date Dose VIS Date Route   Pfizer COVID-19 Vaccine 04/15/2019 10:08 AM 0.3 mL 01/02/2019 Intramuscular   Manufacturer: [place]   Lot: [id number]   [employer]: [id number]

## 2019-04-23 ENCOUNTER — Other Ambulatory Visit: Payer: Medicare Other

## 2019-04-30 ENCOUNTER — Ambulatory Visit: Payer: Medicare Other | Admitting: Internal Medicine

## 2019-05-04 ENCOUNTER — Encounter: Payer: Self-pay | Admitting: Internal Medicine

## 2019-05-04 ENCOUNTER — Ambulatory Visit (INDEPENDENT_AMBULATORY_CARE_PROVIDER_SITE_OTHER): Payer: Medicare Other | Admitting: Internal Medicine

## 2019-05-04 ENCOUNTER — Other Ambulatory Visit: Payer: Self-pay

## 2019-05-04 VITALS — BP 120/70 | HR 81 | Temp 97.8°F | Resp 16 | Ht 67.0 in | Wt 148.4 lb

## 2019-05-04 DIAGNOSIS — E1142 Type 2 diabetes mellitus with diabetic polyneuropathy: Secondary | ICD-10-CM

## 2019-05-04 DIAGNOSIS — G5602 Carpal tunnel syndrome, left upper limb: Secondary | ICD-10-CM | POA: Diagnosis not present

## 2019-05-04 DIAGNOSIS — E1151 Type 2 diabetes mellitus with diabetic peripheral angiopathy without gangrene: Secondary | ICD-10-CM | POA: Diagnosis not present

## 2019-05-04 DIAGNOSIS — E1165 Type 2 diabetes mellitus with hyperglycemia: Secondary | ICD-10-CM | POA: Diagnosis not present

## 2019-05-04 DIAGNOSIS — R911 Solitary pulmonary nodule: Secondary | ICD-10-CM

## 2019-05-04 DIAGNOSIS — I10 Essential (primary) hypertension: Secondary | ICD-10-CM

## 2019-05-04 DIAGNOSIS — F4321 Adjustment disorder with depressed mood: Secondary | ICD-10-CM | POA: Diagnosis not present

## 2019-05-04 DIAGNOSIS — Z7189 Other specified counseling: Secondary | ICD-10-CM

## 2019-05-04 DIAGNOSIS — D692 Other nonthrombocytopenic purpura: Secondary | ICD-10-CM | POA: Diagnosis not present

## 2019-05-04 DIAGNOSIS — Z8546 Personal history of malignant neoplasm of prostate: Secondary | ICD-10-CM | POA: Diagnosis not present

## 2019-05-04 NOTE — Progress Notes (Signed)
Location:  [facility] clinic Provider:  Enoc Getter L. Mariea [name], D.O., C.M.D.  Code Status: DNR--discussed and completed today Goals of Care:  Advanced Directives 05/04/2019  Does Patient Have a Medical Advance Directive? Yes  Type of Advance Directive Living will  Does patient want to make changes to medical advance directive? No - Patient declined  Copy of [name] in Chart? -  Would patient like information on creating a medical advance directive? -   Chief Complaint  Patient presents with  . Medical Management of Chronic Issues    Follow Up for theraputic shoe paper work     HPI: Patient is a 86 y.o. male seen today for diabetic shoe form to be completed.  Turns out, he was meant to come for a routine visit, but not indicated on schedule.   Has nerve damage on the lateral aspect of his right foot.  Cannot move all of his toes together.    He is getting a lot of minor abrasions to his legs and arms.  He is wearing socks with the end cut off to keep himself from getting bruised and bleed on his arms.  No CTS--they are concerned he will have chronic pain.    He has had the lung nodule and has the f/u CT coming up.  He knows about an area of scar tissue he had from a prior pneumonia.    His oldest daughter has breast cancer and is going through chemotherapy for a fast-spreading cancer.  She will also need radiation.  She was very active.  Another daughter had been into drugs, had liver trouble, and had covid positive result.  Went to rehab and she had a heart attack.  She'd been on a ventilator for several days.  She did not want to go to a nursing home.  She was miserable.  They opted to discontinue care and she passed away.  Third daughter is a Clinical cytogeneticist for [employer].  The 4th was a paralegal and would love to stay home all the time.  hba1c was 10.2 4 mos ago.    Past Medical History:  Diagnosis Date  . Allergic rhinitis, cause unspecified   . Carpal tunnel syndrome     . Cervical spondylosis without myelopathy   . Cervical spondylosis without myelopathy   . Cervicalgia   . Diverticulosis of colon (without mention of hemorrhage)   . Esophageal reflux   . Hypertrophy of prostate with urinary obstruction and other lower urinary tract symptoms (LUTS)   . Insomnia, unspecified   . Internal hemorrhoids without mention of complication   . Irritable bowel syndrome   . Macular degeneration (senile) of retina, unspecified   . Malignant neoplasm of prostate ([place])   . Neurogenic bladder, NOS   . Neuropathy   . Nonspecific (abnormal) findings on radiological and other examination of gastrointestinal tract   . Orthostatic hypotension   . Osteoarthrosis, unspecified whether generalized or localized, unspecified site   . Other and unspecified hyperlipidemia   . Other malaise and fatigue   . Other specified disorder of rectum and anus    Radiation Proctitus  . Paroxysmal supraventricular tachycardia ([name])   . Restless legs syndrome (RLS)   . Retinal detachment with retinal defect, unspecified   . Right bundle branch block    Incomplete  . Spermatocele    Right  . Trigger finger (acquired)   . Type II or unspecified type diabetes mellitus without mention of complication, not stated as  uncontrolled   . Type II or unspecified type diabetes mellitus without mention of complication, uncontrolled   . Unspecified essential hypertension    patient denies, states he has a "fast heart rate, but no high BP"  . Vitamin D deficiency     Past Surgical History:  Procedure Laterality Date  . Angiolipoma  1980   Right arm  . APPENDECTOMY  1951  . CHOLECYSTECTOMY  01/1994  . COLONOSCOPY  04/11/2010  . COLONOSCOPY W/ POLYPECTOMY  2005   Removed 2 (two) polyps  . DUPUYTREN CONTRACTURE RELEASE Bilateral 1989  . EYE SURGERY  2006   Left  . EYE SURGERY  2006   Retina reattachment, right  . EYE SURGERY  02/1994   Cataract surgery, right  . EYE SURGERY  01/1995    Cataract surgery, left  . KNEE SURGERY  1979  . PARATHYROIDECTOMY Left 12/27/2016   Procedure: LEFT INFERIOR PARATHYROIDECTOMY;  Surgeon: Armandina Gemma, MD;  Location: [facility];  Service: General;  Laterality: Left;  . POLYPECTOMY  1955   Vocal cord  . RETINAL DETACHMENT SURGERY Right 2005  . SHOULDER ARTHROSCOPY Right 03/22/15   [facility]  . TENDON RELEASE  01/1997   seven fingers  . TRANSURETHRAL RESECTION OF PROSTATE  08/1989  . VITRECTOMY Left 04/23/2013   [facility]    Allergies  Allergen Reactions  . Aspirin Other (See Comments)    Bothers stomach, thins blood   . Atorvastatin Other (See Comments)    Muscular pain and weakness  . Prednisone Other (See Comments)    Upset stomach, can take by shot not by mouth  . Simvastatin Other (See Comments)    Muscular pain and weakness  . Sulfa Antibiotics Other (See Comments)    Upset stomach   . Latex Rash  . Metformin Hcl Nausea Only  . Voltaren [Diclofenac Sodium] Other (See Comments)    Unknown    Outpatient Encounter Medications as of 05/04/2019  Medication Sig  . acetaminophen (TYLENOL) 500 MG tablet Take 1,000 mg by mouth 3 (three) times daily.  Marland Kitchen alum & mag hydroxide-simeth (MAALOX/MYLANTA) 200-200-20 MG/5ML suspension Take 30 mLs by mouth as needed for indigestion or heartburn.  . cholecalciferol (VITAMIN D) 400 units TABS tablet Take 400 Units by mouth daily.  Marland Kitchen docusate sodium (COLACE) 100 MG capsule Take 100 mg by mouth daily as needed.   . famotidine (PEPCID AC) 10 MG chewable tablet Chew 10 mg by mouth daily as needed for heartburn.  . gabapentin (NEURONTIN) 300 MG capsule Take 1 capsule (300 mg total) by mouth 3 (three) times daily.  . Insulin Glargine (LANTUS SOLOSTAR) 100 UNIT/ML Solostar Pen INJECT SUBCUTANEOUSLY 48  UNITS IN THE MORNING  . Insulin Pen Needle (B-D ULTRAFINE III SHORT PEN) 31G X 8 MM MISC Use once daily for insulin  . loperamide (IMODIUM) 1 MG/5ML solution Take 1 mg by mouth as needed for diarrhea or  loose stools.  Marland Kitchen loratadine (CLARITIN) 10 MG tablet Take 10 mg by mouth daily as needed.   . metoprolol tartrate (LOPRESSOR) 25 MG tablet Take 0.5 tablets (12.5 mg total) by mouth 2 (two) times daily.  . mupirocin ointment (BACTROBAN) 2 % One application to affected area on right leg.  . Probiotic Product (PROBIOTIC-10) CHEW Chew 1 each by mouth daily.   . tamsulosin (FLOMAX) 0.4 MG CAPS capsule TAKE 1 CAPSULE BY MOUTH  DAILY FOR PROSTATE  . traMADol (ULTRAM) 50 MG tablet Take 1 tablet (50 mg total) by mouth every 6 (  six) hours as needed for moderate pain.  Marland Kitchen trolamine salicylate (ASPERCREME) 10 % cream Apply 1 application topically as needed for muscle pain.  . cephALEXin (KEFLEX) 500 MG capsule Take 500 mg by mouth every 12 (twelve) hours.   No facility-administered encounter medications on file as of 05/04/2019.    Review of Systems:  Review of Systems  Constitutional: Negative for chills, fever and malaise/fatigue.  HENT: Positive for hearing loss. Negative for congestion and sore throat.        Continues to talk b/c does not realize the other person has responded  Eyes: Positive for blurred vision.  Respiratory: Negative for cough and shortness of breath.   Cardiovascular: Negative for chest pain, palpitations and leg swelling.  Gastrointestinal: Negative for abdominal pain, blood in stool, constipation, diarrhea and melena.  Genitourinary: Negative for dysuria.  Musculoskeletal: Positive for joint pain. Negative for falls.  Skin: Negative for itching and rash.  Neurological: Positive for tingling and sensory change. Negative for dizziness and loss of consciousness.  Endo/Heme/Allergies: Bruises/bleeds easily.  Psychiatric/Behavioral: Negative for depression and memory loss. The patient is not nervous/anxious and does not have insomnia.        Grieving loss of daughter and worried about other daughter who has breast cancer    Health Maintenance  Topic Date Due  . OPHTHALMOLOGY  EXAM  08/27/2018  . URINE MICROALBUMIN  12/31/2018  . INFLUENZA VACCINE  08/23/2019  . HEMOGLOBIN A1C  11/03/2019  . FOOT EXAM  05/03/2020  . TETANUS/TDAP  06/28/2028  . PNA vac Low Risk Adult  Completed    Physical Exam: Vitals:   05/04/19 1041  BP: 120/70  Pulse: 81  Resp: 16  Temp: 97.8 F (36.6 C)  SpO2: 97%  Weight: 148 lb 6.4 oz (67.3 kg)  Height: 5\' 7"  (1.702 m)   Body mass index is 23.24 kg/m. Physical Exam Vitals reviewed.  Constitutional:      General: He is not in acute distress.    Appearance: Normal appearance. He is not toxic-appearing.  HENT:     Head: Normocephalic and atraumatic.     Ears:     Comments: HOH Eyes:     Comments: Very poor vision so eye contact not good; wife does driving and she's getting where she shouldn't either  Cardiovascular:     Rate and Rhythm: Normal rate and regular rhythm.     Pulses: Normal pulses.     Heart sounds: Normal heart sounds.  Pulmonary:     Effort: Pulmonary effort is normal.     Breath sounds: Normal breath sounds. No wheezing.  Abdominal:     General: Bowel sounds are normal. There is no distension.     Palpations: Abdomen is soft.     Tenderness: There is no abdominal tenderness.  Musculoskeletal:        General: Normal range of motion.     Right lower leg: No edema.     Left lower leg: No edema.     Comments: Loss of sensation of right lateral foot--last three toes and plantar aspect of foot on lateral aspect; left foot sensation intact to monofilament; proprioception intact; feet appear clean, no fungal infection of nails, pulses intact but diminished, feet warm and normal colored  Skin:    General: Skin is warm and dry.  Neurological:     General: No focal deficit present.     Mental Status: He is alert and oriented to person, place, and time.    Diabetic  foot exam was performed with the following findings:   No deformities, ulcerations, or other skin breakdown Intact posterior tibialis and  dorsalis pedis pulses See regular exam findings    Labs reviewed: Basic Metabolic Panel: Recent Labs    07/12/18 0353 08/01/18 0844 09/04/18 1437 12/24/18 0906 05/04/19 1153  NA 135   < > 136 141 140  K 3.9   < > 4.4 4.2 4.2  CL 109   < > 101 107 105  CO2 19*   < > 25 26 26   GLUCOSE 100*   < > 199* 147* 153*  BUN 13   < > 13 15 13   CREATININE 1.16   < > 0.82 1.02 0.90  CALCIUM 7.5*   < > 9.7  9.7 9.6 10.0  MG 2.1  --   --   --   --   TSH 0.841  --   --   --   --    < > = values in this interval not displayed.   Liver Function Tests: Recent Labs    07/10/18 1130 07/10/18 1130 07/11/18 0259 07/12/18 0353 12/24/18 0906  AST 22   < > 29 21 13   ALT 23   < > 23 19 14   ALKPHOS 75  --  57 52  --   BILITOT 0.7   < > 0.6 0.5 0.5  PROT 6.2*   < > 4.4* 4.2* 6.1  ALBUMIN 3.3*  --  2.4* 2.3*  --    < > = values in this interval not displayed.   No results for input(s): LIPASE, AMYLASE in the last 8760 hours. No results for input(s): AMMONIA in the last 8760 hours. CBC: Recent Labs    09/18/18 1106 12/24/18 0906 05/04/19 1153  WBC 8.0 6.2 5.3  NEUTROABS 5,216 4,303 3,503  HGB 12.2* 13.9 14.5  HCT 36.8* 40.8 41.7  MCV 90.4 89.3 89.7  PLT 218 200 208   Lipid Panel: No results for input(s): CHOL, HDL, LDLCALC, TRIG, CHOLHDL, LDLDIRECT in the last 8760 hours. Lab Results  Component Value Date   HGBA1C 9.6 (H) 05/04/2019     Assessment/Plan 1. DM (diabetes mellitus), type 2 with peripheral vascular complications (HCC) -has diminished but intact pulses in feet -will certainly benefit form new diabetic shoes with molded inserts so prescription filled out and note will be faxed to supplier - Hemoglobin A1c; Future - BASIC METABOLIC PANEL WITH GFR; Future - 18moBMPGFR - 62moCBCwdiffplt - 50mohba1c  2. Diabetic polyneuropathy associated with type 2 diabetes mellitus ([place]) -diminished on right side of right foot as above -needs new diabetic shoes -f/u labs due so done  today -control has been poor with his dietary choices and they've been worse with people bringing family food when their daughter passed away - Hemoglobin A1c; Future  3. Essential hypertension -bp at goal with current therapy, no dizziness reported, cont same regimen and monitor  4. Senile purpura (HCC) -continues to get small ecchymoses of arms and legs in areas easily bumped--he's actually wearing cut off socks on his arms when doing yardwork  5. Left carpal tunnel syndrome -surgeon did not think operating on him was in his best interest for this due to concerns he'd develop chronic pain (per pt)  6. Grief -seems to be coping quite well with his loss, has support of his wife and other daughters -advised to call if he needed counseling recommendation or medication for his mood  7. ACP (advance care planning) - due to  recent events with his daughter, I brought up his wishes today, and he was quite clear (and tells me how his living will says so), he would NOT want CPR to prolong his life artificially if his heart were to stop or he stopped breathing -he thinks he might keep on going for 39 years after his retirement (89), but does not want to be put on machines  -[place]enrod form was completed for him today (he also says his wife feels this way about herself--she was not back with him for his visit) - Do not attempt resuscitation (DNR)  8. Hyperglycemia due to diabetes mellitus ([place]) - ongoing--f/u labs today and cont same meds, insulin pending results - 43moBMPGFR - 58mohba1c  9. Right lower lobe pulmonary nodule -has upcoming CT to f/u on this due to growth when last imaged--await results  - 48moCBCwdiffplt  10. History of prostate cancer - no new symptoms or concerns in this realm - 73moCBCwdiffplt   Labs/tests ordered:   Lab Orders     Hemoglobin A1c     BASIC METABOLIC PANEL WITH GFR  Next appt:  09/07/2019   Dov Dill L. Enza Shone, D.O. [facility] [address], [place] [address] Cell Phone (Mon-Fri 8am-5pm):  [phone] On Call:  [phone] & follow prompts after 5pm & weekends Office Phone:  [phone] Office Fax:  [phone]

## 2019-05-05 LAB — CBC WITH DIFFERENTIAL/PLATELET
Absolute Monocytes: 705 cells/uL (ref 200–950)
Basophils Absolute: 48 cells/uL (ref 0–200)
Basophils Relative: 0.9 %
Eosinophils Absolute: 191 cells/uL (ref 15–500)
Eosinophils Relative: 3.6 %
HCT: 41.7 % (ref 38.5–50.0)
Hemoglobin: 14.5 g/dL (ref 13.2–17.1)
Lymphs Abs: 853 cells/uL (ref 850–3900)
MCH: 31.2 pg (ref 27.0–33.0)
MCHC: 34.8 g/dL (ref 32.0–36.0)
MCV: 89.7 fL (ref 80.0–100.0)
MPV: 11.6 fL (ref 7.5–12.5)
Monocytes Relative: 13.3 %
Neutro Abs: 3503 cells/uL (ref 1500–7800)
Neutrophils Relative %: 66.1 %
Platelets: 208 10*3/uL (ref 140–400)
RBC: 4.65 10*6/uL (ref 4.20–5.80)
RDW: 11.8 % (ref 11.0–15.0)
Total Lymphocyte: 16.1 %
WBC: 5.3 10*3/uL (ref 3.8–10.8)

## 2019-05-05 LAB — BASIC METABOLIC PANEL WITH GFR
BUN: 13 mg/dL (ref 7–25)
CO2: 26 mmol/L (ref 20–32)
Calcium: 10 mg/dL (ref 8.6–10.3)
Chloride: 105 mmol/L (ref 98–110)
Creat: 0.9 mg/dL (ref 0.70–1.11)
GFR, Est African American: 87 mL/min/{1.73_m2} (ref >=60)
GFR, Est Non African American: 75 mL/min/{1.73_m2} (ref >=60)
Glucose, Bld: 153 mg/dL — ABNORMAL HIGH (ref 65–99)
Potassium: 4.2 mmol/L (ref 3.5–5.3)
Sodium: 140 mmol/L (ref 135–146)

## 2019-05-05 LAB — HEMOGLOBIN A1C
Hgb A1c MFr Bld: 9.6 % of total Hgb — ABNORMAL HIGH (ref <5.7)
Mean Plasma Glucose: 229 (calc)
eAG (mmol/L): 12.7 (calc)

## 2019-05-05 NOTE — Progress Notes (Signed)
Sugar average remain high at 9.6.  goal is less than 8.  He is prone to lows when he has tight control.  Their diet has historically been a lot of breakfast food and sandwiches and they recently had a death in the family and he admitted to eating a lot of poor choices during that time. rest of labs ok (kidneys, electrolytes and blood counts)

## 2019-05-08 ENCOUNTER — Other Ambulatory Visit: Payer: Self-pay | Admitting: Internal Medicine

## 2019-05-11 ENCOUNTER — Other Ambulatory Visit: Payer: Self-pay | Admitting: Internal Medicine

## 2019-05-11 DIAGNOSIS — G5602 Carpal tunnel syndrome, left upper limb: Secondary | ICD-10-CM

## 2019-05-11 DIAGNOSIS — G5622 Lesion of ulnar nerve, left upper limb: Secondary | ICD-10-CM

## 2019-05-11 NOTE — Telephone Encounter (Signed)
rx sent to pharmacy by e-script  

## 2019-05-13 ENCOUNTER — Ambulatory Visit
Admission: RE | Admit: 2019-05-13 | Discharge: 2019-05-13 | Disposition: A | Discharge status: Home / Self Care | Payer: Medicare Other | Source: Ambulatory Visit | Attending: Internal Medicine | Admitting: Internal Medicine

## 2019-05-13 DIAGNOSIS — R918 Other nonspecific abnormal finding of lung field: Secondary | ICD-10-CM

## 2019-05-13 DIAGNOSIS — R911 Solitary pulmonary nodule: Secondary | ICD-10-CM

## 2019-05-14 NOTE — Progress Notes (Signed)
CT still shows a nodule at the right base.  The middle right nodule is also stable.  3-6 month f/u was again recommended for this.  I would recommend monitoring him for concerning findings rather than repeating CTs again and again at his advanced age.  Interventions would be biopsy and then what?  Surgery and chemo?  I don't think he'd tolerate all of that if the nodule did grow.

## 2019-05-17 ENCOUNTER — Other Ambulatory Visit: Payer: Self-pay | Admitting: Internal Medicine

## 2019-05-18 NOTE — Telephone Encounter (Signed)
rx sent to pharmacy by e-script  

## 2019-05-19 ENCOUNTER — Telehealth: Payer: Self-pay

## 2019-05-19 NOTE — Telephone Encounter (Signed)
It's fine to set up a video visit to go over the CT chest results.

## 2019-05-19 NOTE — Telephone Encounter (Signed)
Video visit for Thursday at name

## 2019-05-19 NOTE — Telephone Encounter (Signed)
Micheal [name] called and would like for you to explain in detail the results of Micheal [name]' CT scan, she said although the know some she can't understand the Dr. Meredeth Ide (language) that it's in. She also stated if you would like to this this as a video visit that would be fine with them.

## 2019-05-21 ENCOUNTER — Telehealth (INDEPENDENT_AMBULATORY_CARE_PROVIDER_SITE_OTHER): Payer: Medicare Other | Admitting: Internal Medicine

## 2019-05-21 ENCOUNTER — Other Ambulatory Visit: Payer: Self-pay

## 2019-05-21 ENCOUNTER — Encounter: Payer: Self-pay | Admitting: Internal Medicine

## 2019-05-21 DIAGNOSIS — Z8546 Personal history of malignant neoplasm of prostate: Secondary | ICD-10-CM | POA: Diagnosis not present

## 2019-05-21 DIAGNOSIS — R911 Solitary pulmonary nodule: Secondary | ICD-10-CM | POA: Insufficient documentation

## 2019-05-21 DIAGNOSIS — R0600 Dyspnea, unspecified: Secondary | ICD-10-CM

## 2019-05-21 DIAGNOSIS — N3941 Urge incontinence: Secondary | ICD-10-CM | POA: Diagnosis not present

## 2019-05-21 DIAGNOSIS — J90 Pleural effusion, not elsewhere classified: Secondary | ICD-10-CM

## 2019-05-21 DIAGNOSIS — I700 Atherosclerosis of aorta: Secondary | ICD-10-CM | POA: Insufficient documentation

## 2019-05-21 DIAGNOSIS — Z7189 Other specified counseling: Secondary | ICD-10-CM | POA: Diagnosis not present

## 2019-05-21 DIAGNOSIS — R0609 Other forms of dyspnea: Secondary | ICD-10-CM

## 2019-05-21 MED ORDER — FUROSEMIDE 20 MG PO TABS: 20.0000 mg | ORAL_TABLET | Freq: Every day | ORAL | 0 refills | Status: DC

## 2019-05-21 NOTE — Patient Instructions (Addendum)
Try taking lasix 20mg  daily for just 3 days, then stop.  I will give you 30 pills in case this is very effective.  This may help with the fluid at the bases of your lungs.  Give me a call next week with an update on your breathing after you've done this.

## 2019-05-21 NOTE — Progress Notes (Signed)
Patient ID: Micheal [name], male   DOB: 11/11/1928, 86 y.o.   MRN: [id number] This service is provided via telemedicine  No vital signs collected/recorded due to the encounter was a telemedicine visit.   Location of patient (ex: home, work):  Home   Patient consents to a telephone visit: yes   Location of the provider (ex: office, home):  [facility] office  Name of any referring provider:Nayson Traweek DO  Names of all persons participating in the telemedicine service and their role in the encounter:  Lonni Dirden DO, Charlene CMA and patient and wife.  Time spent on call:  80min    Provider:  Davontae Prusinski L. Mariea Clonts, D.O., C.M.D.  Code Status: DNR Goals of Care:  Advanced Directives 05/21/2019  Does Patient Have a Medical Advance Directive? Yes  Type of Advance Directive Out of facility DNR (pink MOST or yellow form);Living will  Does patient want to make changes to medical advance directive? No - Patient declined  Copy of [place] in Chart? -  Would patient like information on creating a medical advance directive? -  Pre-existing out of facility DNR order (yellow form or pink MOST form) Pink MOST/Yellow Form most recent copy in chart - Physician notified to receive inpatient order   Chief Complaint  Patient presents with  . Acute Visit    Result from CT labs     HPI: Patient is a 86 y.o. male spoken with by phone to review results of CT chest w/o contrast from 05/13/19 to f/u on his RLL lung nodule  He's going through a daughter dealing with chemotherapy.  He knows he could not tolerate such strong treatments.    His only new symptoms have been increased dyspnea and fatigue that he did not previously have.  This began before pollen with allergies.  We discussed the little bit of fluid at the base of his lungs.    He's unable to read with his poor vision and his wife and children were trying to understand the CT on their own.  Past Medical History:  Diagnosis Date  .  Allergic rhinitis, cause unspecified   . Carpal tunnel syndrome   . Cervical spondylosis without myelopathy   . Cervical spondylosis without myelopathy   . Cervicalgia   . Diverticulosis of colon (without mention of hemorrhage)   . Esophageal reflux   . Hypertrophy of prostate with urinary obstruction and other lower urinary tract symptoms (LUTS)   . Insomnia, unspecified   . Internal hemorrhoids without mention of complication   . Irritable bowel syndrome   . Macular degeneration (senile) of retina, unspecified   . Malignant neoplasm of prostate ([place])   . Neurogenic bladder, NOS   . Neuropathy   . Nonspecific (abnormal) findings on radiological and other examination of gastrointestinal tract   . Orthostatic hypotension   . Osteoarthrosis, unspecified whether generalized or localized, unspecified site   . Other and unspecified hyperlipidemia   . Other malaise and fatigue   . Other specified disorder of rectum and anus    Radiation Proctitus  . Paroxysmal supraventricular tachycardia ([place])   . Restless legs syndrome (RLS)   . Retinal detachment with retinal defect, unspecified   . Right bundle branch block    Incomplete  . Spermatocele    Right  . Trigger finger (acquired)   . Type II or unspecified type diabetes mellitus without mention of complication, not stated as uncontrolled   . Type II or unspecified type diabetes mellitus  without mention of complication, uncontrolled   . Unspecified essential hypertension    patient denies, states he has a "fast heart rate, but no high BP"  . Vitamin D deficiency     Past Surgical History:  Procedure Laterality Date  . Angiolipoma  1980   Right arm  . APPENDECTOMY  1951  . CHOLECYSTECTOMY  01/1994  . COLONOSCOPY  04/11/2010  . COLONOSCOPY W/ POLYPECTOMY  2005   Removed 2 (two) polyps  . DUPUYTREN CONTRACTURE RELEASE Bilateral 1989  . EYE SURGERY  2006   Left  . EYE SURGERY  2006   Retina reattachment, right  . EYE SURGERY   02/1994   Cataract surgery, right  . EYE SURGERY  01/1995   Cataract surgery, left  . KNEE SURGERY  1979  . PARATHYROIDECTOMY Left 12/27/2016   Procedure: LEFT INFERIOR PARATHYROIDECTOMY;  Surgeon: Armandina Gemma, MD;  Location: [facility];  Service: General;  Laterality: Left;  . POLYPECTOMY  1955   Vocal cord  . RETINAL DETACHMENT SURGERY Right 2005  . SHOULDER ARTHROSCOPY Right 03/22/15   [facility]  . TENDON RELEASE  01/1997   seven fingers  . TRANSURETHRAL RESECTION OF PROSTATE  08/1989  . VITRECTOMY Left 04/23/2013   [facility]    Allergies  Allergen Reactions  . Aspirin Other (See Comments)    Bothers stomach, thins blood   . Atorvastatin Other (See Comments)    Muscular pain and weakness  . Prednisone Other (See Comments)    Upset stomach, can take by shot not by mouth  . Simvastatin Other (See Comments)    Muscular pain and weakness  . Sulfa Antibiotics Other (See Comments)    Upset stomach   . Latex Rash  . Metformin Hcl Nausea Only  . Voltaren [Diclofenac Sodium] Other (See Comments)    Unknown    Outpatient Encounter Medications as of 05/21/2019  Medication Sig  . acetaminophen (TYLENOL) 500 MG tablet Take 1,000 mg by mouth 3 (three) times daily.  Marland Kitchen alum & mag hydroxide-simeth (MAALOX/MYLANTA) 200-200-20 MG/5ML suspension Take 30 mLs by mouth as needed for indigestion or heartburn.  . cephALEXin (KEFLEX) 500 MG capsule Take 500 mg by mouth every 12 (twelve) hours.  . cholecalciferol (VITAMIN D) 400 units TABS tablet Take 400 Units by mouth daily.  Marland Kitchen docusate sodium (COLACE) 100 MG capsule Take 100 mg by mouth daily as needed.   . famotidine (PEPCID AC) 10 MG chewable tablet Chew 10 mg by mouth daily as needed for heartburn.  . gabapentin (NEURONTIN) 300 MG capsule TAKE 1 CAPSULE BY MOUTH 3  TIMES DAILY  . insulin glargine (LANTUS SOLOSTAR) 100 UNIT/ML Solostar Pen INJECT SUBCUTANEOUSLY 45  UNITS IN THE MORNING  . Insulin Pen Needle (B-D ULTRAFINE III SHORT PEN) 31G X 8  MM MISC Use once daily for insulin  . loperamide (IMODIUM) 1 MG/5ML solution Take 1 mg by mouth as needed for diarrhea or loose stools.  Marland Kitchen loratadine (CLARITIN) 10 MG tablet Take 10 mg by mouth daily as needed.   . metoprolol tartrate (LOPRESSOR) 25 MG tablet TAKE ONE-HALF TABLET BY  MOUTH TWICE DAILY  . mupirocin ointment (BACTROBAN) 2 % One application to affected area on right leg.  . Probiotic Product (PROBIOTIC-10) CHEW Chew 1 each by mouth daily.   . tamsulosin (FLOMAX) 0.4 MG CAPS capsule TAKE 1 CAPSULE BY MOUTH  DAILY FOR PROSTATE  . traMADol (ULTRAM) 50 MG tablet Take 1 tablet (50 mg total) by mouth every 6 (six) hours  as needed for moderate pain.  Marland Kitchen trolamine salicylate (ASPERCREME) 10 % cream Apply 1 application topically as needed for muscle pain.   No facility-administered encounter medications on file as of 05/21/2019.    Review of Systems:  Review of Systems  Constitutional: Positive for malaise/fatigue. Negative for chills, fever and weight loss.  HENT: Positive for hearing loss.   Eyes: Positive for blurred vision.  Respiratory: Positive for shortness of breath. Negative for cough and sputum production.   Cardiovascular: Negative for chest pain, palpitations, orthopnea, leg swelling and PND.  Gastrointestinal: Negative for abdominal pain.  Genitourinary: Positive for urgency. Negative for dysuria.       Some leakage on way to restroom   Musculoskeletal: Positive for joint pain. Negative for falls.  Skin: Negative for itching and rash.  Neurological: Negative for dizziness and loss of consciousness.  Endo/Heme/Allergies: Bruises/bleeds easily.  Psychiatric/Behavioral: Negative for depression and memory loss. The patient is not nervous/anxious and does not have insomnia.        Mild cognitive impairment at worst    Health Maintenance  Topic Date Due  . OPHTHALMOLOGY EXAM  08/27/2018  . URINE MICROALBUMIN  12/31/2018  . INFLUENZA VACCINE  08/23/2019  . HEMOGLOBIN A1C   11/03/2019  . FOOT EXAM  05/03/2020  . TETANUS/TDAP  06/28/2028  . COVID-19 Vaccine  Completed  . PNA vac Low Risk Adult  Completed    Physical Exam: Could not be performed as visit non face-to-face via phone   Labs reviewed: Basic Metabolic Panel: Recent Labs    07/12/18 0353 08/01/18 0844 09/04/18 1437 12/24/18 0906 05/04/19 1153  NA 135   < > 136 141 140  K 3.9   < > 4.4 4.2 4.2  CL 109   < > 101 107 105  CO2 19*   < > 25 26 26   GLUCOSE 100*   < > 199* 147* 153*  BUN 13   < > 13 15 13   CREATININE 1.16   < > 0.82 1.02 0.90  CALCIUM 7.5*   < > 9.7  9.7 9.6 10.0  MG 2.1  --   --   --   --   TSH 0.841  --   --   --   --    < > = values in this interval not displayed.   Liver Function Tests: Recent Labs    07/10/18 1130 07/10/18 1130 07/11/18 0259 07/12/18 0353 12/24/18 0906  AST 22   < > 29 21 13   ALT 23   < > 23 19 14   ALKPHOS 75  --  57 52  --   BILITOT 0.7   < > 0.6 0.5 0.5  PROT 6.2*   < > 4.4* 4.2* 6.1  ALBUMIN 3.3*  --  2.4* 2.3*  --    < > = values in this interval not displayed.   No results for input(s): LIPASE, AMYLASE in the last 8760 hours. No results for input(s): AMMONIA in the last 8760 hours. CBC: Recent Labs    09/18/18 1106 12/24/18 0906 05/04/19 1153  WBC 8.0 6.2 5.3  NEUTROABS 5,216 4,303 3,503  HGB 12.2* 13.9 14.5  HCT 36.8* 40.8 41.7  MCV 90.4 89.3 89.7  PLT 218 200 208   Lipid Panel: No results for input(s): CHOL, HDL, LDLCALC, TRIG, CHOLHDL, LDLDIRECT in the last 8760 hours. Lab Results  Component Value Date   HGBA1C 9.6 (H) 05/04/2019    Procedures since last visit: CT Chest Wo Contrast  Result Date: 05/14/2019 CLINICAL DATA:  Follow-up right lower lobe lung nodule, history of prostate cancer with radiotherapy EXAM: CT CHEST WITHOUT CONTRAST TECHNIQUE: Multidetector CT imaging of the chest was performed following the standard protocol without IV contrast. COMPARISON:  11/05/2018 FINDINGS: Cardiovascular: Calcified  atherosclerotic plaque in the thoracic aorta. The calcifications about the aortic valve. Heart size stable. No significant pericardial fluid. Limited assessment of central pulmonary vasculature but with normal caliber. Mediastinum/Nodes: No axillary or thoracic inlet adenopathy. No mediastinal lymphadenopathy. Lungs/Pleura: Small nodule in the right lower lobe measuring 7 x 6 mm. Basilar atelectasis and septal thickening. Small amount of pleural fluid along the posterior right chest. Also trace effusion on the left. Mild nodularity in the medial right lung base is similar to the prior exam. Upper Abdomen: Post cholecystectomy as before. No acute upper abdominal process. Musculoskeletal: Spinal degenerative changes. No acute or destructive bone finding. IMPRESSION: 1. Trace bilateral effusions and basilar atelectasis. 2. Question of some mild septal thickening. Correlate with any clinical evidence of shortness of breath or findings that would suggest heart failure. Suggestion of air trapping and some subpleural reticulation may be indicative of early interstitial lung disease. Consider correlation with symptoms and high-resolution CT follow-up as warranted, also to assess lung nodule perhaps at another 3-6 month interval. 3. Stable 7 x 6 mm nodule in the right lower lobe. Stable based on comparison with previous imaging. 4. Stable mild nodularity in the medial right lung base. 5. Aortic atherosclerosis. Aortic Atherosclerosis (ICD10-I70.0). Electronically Signed   By: Zetta Bills M.D.   On: 05/14/2019 07:50    Assessment/Plan 1. Pulmonary nodule, right -two--Middle and lower lobe--both stable on CT this month vs 10/20 CT -has h/o prostate ca -discussed with him and his wife today--reviewed findings and stability -reviewed that if we continue imaging and the nodule did grow to a size worth imaging, would he be a candidate for biopsy, surgery, chemo--conclusion was no, he would not tolerate such a procedure  well--so we determined that it does not make good sense to continue putting him through the stress, cost, inconvenience, radiation of having regular repeat CTs of this chest since intervention is not a reasonable option -he and his wife understand this and accept it  2. Dyspnea on exertion -and fatigue--more noticeable lately and there was some sign of mild volume overload on the CT -we opted to treat him with a three day course of low dose lasix 20mg  daily and see if he has improvement--I do not want to continue him on this as a daily med due to risk of dehydration plus his already present difficulties with incontinence -if he has benefit, we might implement on an as needed basis for increased dyspnea  3. History of prostate cancer -many years ago s/p surgery and radiation   4. Urge incontinence of urine -occurring now with advanced age and limits feasibility of regular diuretics unless clear CHF and benefit from the meds  5. ACP (advance care planning) -see #1--spent 20 mins discussing future plans as far as CT repeat imaging as it relates to his quality of life  6. Pleural effusion - again, will do a three day trial of diuretic and see if it helps his dyspnea -echo in 6/20 showed normal systolic function, but did reveal diastolic dysfunction, no serious valvular disease - furosemide (LASIX) 20 MG tablet; Take 1 tablet (20 mg total) by mouth daily. For 3 days, then stop  Dispense: 30 tablet; Refill: 0  7.  Aortic atherosclerosis -  noted on his CT chest--reviewed with him that at his advanced age, we do not plan any interventions for this outside of trying to eat a healthy diet and staying physically active as his body permits--he's not tolerated statins even at younger age  Labs/tests ordered:  No new added today Next appt:  09/07/2019--keep regular visit as scheduled Non face-to-face time spent on televisit:  45 total minutes including medical and ACP  Jarriel Papillion L. Rolland Steinert,  D.O. [facility] [address], [place] [address] Cell Phone (Mon-Fri 8am-5pm):  [phone] On Call:  [phone] & follow prompts after 5pm & weekends Office Phone:  [phone] Office Fax:  [phone]

## 2019-05-26 ENCOUNTER — Telehealth: Payer: Self-pay

## 2019-05-26 NOTE — Telephone Encounter (Signed)
Has his weight changed?  I had asked them to monitor his weight daily, also, with the lasix.  Can you please ask Micheal [name] what the weights were?  Thanks.

## 2019-05-26 NOTE — Telephone Encounter (Signed)
(  FYI) Patients wife called as she was told to do she said to let Dr. Mariea Clonts know how her husband was doing on the Furosemide he has just started and she says she doesn't notice any change except in his speech he speaks a lot better but she says he still gets short of breath and she's not sure if it's from him working or walking or something else

## 2019-05-26 NOTE — Telephone Encounter (Signed)
Looks like he was 148 lbs on 4/12 so weight is actually still up a lb possibly.  Hard to know without his weight before and after taking the diuretic.  We'll see when she calls back with more weights.

## 2019-05-26 NOTE — Telephone Encounter (Signed)
I called Micheal [name] and asked her if she had been monitoring his wait she had not but she had him go and weight himself right then and she said the weight was 149 lbs I asked her if she could monitor it for a few more days and then let us know if there was any change

## 2019-06-10 ENCOUNTER — Encounter: Payer: Self-pay | Admitting: Internal Medicine

## 2019-06-10 ENCOUNTER — Ambulatory Visit (INDEPENDENT_AMBULATORY_CARE_PROVIDER_SITE_OTHER): Payer: Medicare Other | Admitting: Internal Medicine

## 2019-06-10 ENCOUNTER — Other Ambulatory Visit: Payer: Self-pay

## 2019-06-10 VITALS — BP 130/70 | HR 89 | Temp 97.2°F | Ht 67.0 in | Wt 148.6 lb

## 2019-06-10 DIAGNOSIS — R0602 Shortness of breath: Secondary | ICD-10-CM

## 2019-06-10 DIAGNOSIS — J301 Allergic rhinitis due to pollen: Secondary | ICD-10-CM

## 2019-06-10 DIAGNOSIS — R911 Solitary pulmonary nodule: Secondary | ICD-10-CM

## 2019-06-10 NOTE — Progress Notes (Signed)
[name]    DF:[id number]    1928/02/06  Primary Care Physician:Reed, Rexene Edison, DO  Referring Physician: Gayland Curry, DO [place],  [place] [address] Reason for Consultation: lung nodule Date of Consultation: 06/10/2019  Chief complaint:   Chief Complaint  Patient presents with  . Consult    Nodule seen on CT.  self referral.  sob with exertion (yard work)     HPI: Micheal [name] is a 86 y.o. gentleman who presents for evaluation of RLL nodule.  Had hospitalization for sepsis secondary to diverticulitis in June 2020. At that point RLL lung nodule first noted on CT Abd/Plevis June 2020.  Had a repeat scan in April 2021 which demonstrates persistence of RLL nodule without change.  Prostate cancer in his 6s and had radiation therapy and hormonal therapy.  No fevers, chills night sweats or weight loss.   He notes seasonal allergies including rhinorhhea and itchy eyes. Improved with claritin.  He gets shortness of breath which improves with rest. No coughing, does have some wheezing in the spring and fall. Took inhalers in the past and allergy shots as well. He notes fatigue which is frustrating. He still walks 2 miles a day.   He has macular degeneration and has difficulty with reading   Social history: Occupation: worked in Academic librarian previously Smoking history: very remote  Social History   Occupational History  . Occupation: Retired  Tobacco Use  . Smoking status: Former Smoker    Years: 4.00    Types: Cigarettes    Quit date: 06/09/1948    Years since quitting: 71.0  . Smokeless tobacco: Never Used  . Tobacco comment: 1 pack/wk  Substance and Sexual Activity  . Alcohol use: No    Alcohol/week: 0.0 standard drinks  . Drug use: No  . Sexual activity: Never    Relevant family history:  Family History  Problem Relation Age of Onset  . Depression Mother   . Diabetes Mother   . Mental illness Mother        OCD  . Cerebral aneurysm  Father        age 49  . Hypertension Sister   . Diabetes Daughter   . Hyperlipidemia Daughter   . Hyperparathyroidism Neg Hx     Past Medical History:  Diagnosis Date  . Allergic rhinitis, cause unspecified   . Carpal tunnel syndrome   . Cervical spondylosis without myelopathy   . Cervical spondylosis without myelopathy   . Cervicalgia   . Diverticulosis of colon (without mention of hemorrhage)   . Esophageal reflux   . Hypertrophy of prostate with urinary obstruction and other lower urinary tract symptoms (LUTS)   . Insomnia, unspecified   . Internal hemorrhoids without mention of complication   . Irritable bowel syndrome   . Macular degeneration (senile) of retina, unspecified   . Malignant neoplasm of prostate ([place])   . Neurogenic bladder, NOS   . Neuropathy   . Nonspecific (abnormal) findings on radiological and other examination of gastrointestinal tract   . Orthostatic hypotension   . Osteoarthrosis, unspecified whether generalized or localized, unspecified site   . Other and unspecified hyperlipidemia   . Other malaise and fatigue   . Other specified disorder of rectum and anus    Radiation Proctitus  . Paroxysmal supraventricular tachycardia ([place])   . Restless legs syndrome (RLS)   . Retinal detachment with retinal defect, unspecified   .  Right bundle branch block    Incomplete  . Spermatocele    Right  . Trigger finger (acquired)   . Type II or unspecified type diabetes mellitus without mention of complication, not stated as uncontrolled   . Type II or unspecified type diabetes mellitus without mention of complication, uncontrolled   . Unspecified essential hypertension    patient denies, states he has a "fast heart rate, but no high BP"  . Vitamin D deficiency     Past Surgical History:  Procedure Laterality Date  . Angiolipoma  1980   Right arm  . APPENDECTOMY  1951  . CHOLECYSTECTOMY  01/1994  . COLONOSCOPY  04/11/2010  . COLONOSCOPY W/ POLYPECTOMY   2005   Removed 2 (two) polyps  . DUPUYTREN CONTRACTURE RELEASE Bilateral 1989  . EYE SURGERY  2006   Left  . EYE SURGERY  2006   Retina reattachment, right  . EYE SURGERY  02/1994   Cataract surgery, right  . EYE SURGERY  01/1995   Cataract surgery, left  . KNEE SURGERY  1979  . PARATHYROIDECTOMY Left 12/27/2016   Procedure: LEFT INFERIOR PARATHYROIDECTOMY;  Surgeon: Armandina Gemma, MD;  Location: [facility];  Service: General;  Laterality: Left;  . POLYPECTOMY  1955   Vocal cord  . RETINAL DETACHMENT SURGERY Right 2005  . SHOULDER ARTHROSCOPY Right 03/22/15   [facility]  . TENDON RELEASE  01/1997   seven fingers  . TRANSURETHRAL RESECTION OF PROSTATE  08/1989  . VITRECTOMY Left 04/23/2013   [facility]     Physical Exam: Blood pressure 130/70, pulse 89, temperature (!) 97.2 F (36.2 C), temperature source Temporal, height 5\' 7"  (1.702 m), weight 148 lb 9.6 oz (67.4 kg), SpO2 97 %. Gen:      No acute distress ENT:  no nasal polyps, mucus membranes moist, +cobblestoning in oropharynx Lungs:    No increased respiratory effort, symmetric chest wall excursion, clear to auscultation bilaterally, no wheezes or crackles CV:         Regular rate and rhythm; no murmurs, rubs, or gallops.  No pedal edema Abd:      + bowel sounds; soft, non-tender; no distension MSK: no acute synovitis of DIP or PIP joints, no mechanics hands.  Skin:      Warm and dry; no rashes Neuro: normal speech, no focal facial asymmetry, HOH Psych: alert and oriented x3, normal mood and affect   Data Reviewed/Medical Decision Making:  Independent interpretation of tests: Imaging: . Review of patient's CT Chest images revealed RLL nodule. The patient's images have been independently reviewed by me.    PFTs: None on file  Labs:  Lab Results  Component Value Date   WBC 5.3 05/04/2019   HGB 14.5 05/04/2019   HCT 41.7 05/04/2019   MCV 89.7 05/04/2019   PLT 208 05/04/2019   Lab Results  Component Value Date   NA  140 05/04/2019   K 4.2 05/04/2019   CL 105 05/04/2019   CO2 26 05/04/2019   Echocardiogram June 2020 1. The left ventricle has normal systolic function with an ejection  fraction of 60-65%. The cavity size was normal. Left ventricular diastolic  Doppler parameters are consistent with impaired relaxation. No evidence of  left ventricular regional wall  motion abnormalities.  2. The right ventricle has normal systolic function. The cavity was  mildly enlarged. There is no increase in right ventricular wall thickness.  3. Left atrial size was mildly dilated.  4. The mitral valve was not  well visualized. There is mild mitral annular  calcification present. No evidence of mitral valve stenosis. No  significant mitral regurgitation noted.  5. The aortic valve was not well visualized. Mild calcification of the  aortic valve. No stenosis of the aortic valve.  6. The aortic root is normal in size and structure.  7. The IVC was not visualized. No complete TR doppler jet so unable to  estimate PA systolic pressure.  8. Technically difficult study with poor acoustic windows.    Immunization status:  Immunization History  Administered Date(s) Administered  . Influenza, High Dose Seasonal PF 10/19/2016, 11/01/2017, 10/16/2018  . Influenza,inj,Quad PF,6+ Mos 10/29/2012, 10/28/2013  . Influenza-Unspecified 10/25/2010, 11/07/2011, 10/07/2015  . PFIZER SARS-COV-2 Vaccination 03/22/2019, 04/15/2019  . Pneumococcal Conjugate-13 05/18/2014  . Pneumococcal Polysaccharide-23 01/23/2007  . Td 08/23/2010  . Tdap 06/29/2018    . I reviewed prior external note(s) from primary care . I reviewed the result(s) of the labs and imaging as noted above.  . I have ordered repeat CT Chest   Assessment:  Solitary pulmonary nodule Shortness of breath Allergic Rhinitis   Plan/Recommendations: Based on Fleischner's criteria would recommend repeat CT scan in June 2022. If no change, would stop  monitoring. Differential includes granulomatous disease, much less likely prostate cancer metastasis. He is not high risk for lung cancer based on personal history and risk factors.   Continue claritin for rhinitis  Regarding his dyspnea, possible asthma?  can consider trial of albuterol inhaler - I offered him this today but he is satisfied with his quality of life and would like to continuing maintaining on his own. I think this is reasonable. He also notes fa  I spent 60 minutes in the care of this patient today including pre-charting, chart review, review of results, face-to-face care, coordination of care and communication with consultants etc.).   Return to Care: No follow-ups on file.  Lenice Llamas, MD Pulmonary [facility] [place]  CC: Gayland Curry, DO  Fleischner Society Guidelines [address], [place], [place], et al. Guidelines for management of incidental pulmonary nodules detected on CT Images: From the Fleischner Society. Radiology 2017; [id number]. Copyright  2017 Radiological Society of [place].  Evaluation of the incidental solid pulmonary nodule in adults Nodule size (mm) Low (<5%) cancer risk High (>65%) or moderate (5 to 65%) cancer risk  Solitary  <6 No routine follow-up Optional CT at 12 months  6 to 8 CT at 6 to 12 months, then consider CT at 18 to 24 months CT at 6 to 12 months, then CT at 18 to 24 months  >8 CT at 3 months, then at 9 and 24 months FDG PET/CT, biopsy or resection  Multiple (evaluation based on largest nodule)  <6 No routine follow-up Optional CT at 12 months  ?6 CT at 3 to 6 months, then consider CT at 18 to 24 months CT at 3 to 6 months, then CT at 18 to 24 months    Not applicable to patients age <35 years, in lung cancer screening, with immunosuppression, known pulmonary disease or symptoms or active primary cancer.  Chest CT performed without contrast as contiguous 1 mm  sections using low dose technique.  Growing or FDG-avid nodules should undergo biopsy or resection. Growth is defined as >1.5 mm increase.  Nodules unchanged for >2 years are benign.   CT: computed tomography; FDG: 18-fluorodeoxyglucose; PET: positron emission tomography.

## 2019-06-10 NOTE — Patient Instructions (Signed)
The patient should have follow up as needed.  Can have CT scan in June 2022.

## 2019-07-09 DIAGNOSIS — M25562 Pain in left knee: Secondary | ICD-10-CM | POA: Diagnosis not present

## 2019-07-09 DIAGNOSIS — M25561 Pain in right knee: Secondary | ICD-10-CM | POA: Diagnosis not present

## 2019-08-07 DIAGNOSIS — S61411A Laceration without foreign body of right hand, initial encounter: Secondary | ICD-10-CM | POA: Diagnosis not present

## 2019-08-07 DIAGNOSIS — S81812A Laceration without foreign body, left lower leg, initial encounter: Secondary | ICD-10-CM | POA: Diagnosis not present

## 2019-08-20 DIAGNOSIS — M25512 Pain in left shoulder: Secondary | ICD-10-CM | POA: Diagnosis not present

## 2019-09-02 ENCOUNTER — Other Ambulatory Visit: Payer: Self-pay

## 2019-09-02 DIAGNOSIS — I10 Essential (primary) hypertension: Secondary | ICD-10-CM | POA: Diagnosis not present

## 2019-09-02 DIAGNOSIS — E1142 Type 2 diabetes mellitus with diabetic polyneuropathy: Secondary | ICD-10-CM

## 2019-09-02 DIAGNOSIS — E1151 Type 2 diabetes mellitus with diabetic peripheral angiopathy without gangrene: Secondary | ICD-10-CM

## 2019-09-03 ENCOUNTER — Ambulatory Visit: Payer: Medicare Other | Admitting: Internal Medicine

## 2019-09-03 LAB — CBC WITH DIFFERENTIAL/PLATELET
Absolute Monocytes: 939 cells/uL (ref 200–950)
Basophils Absolute: 62 cells/uL (ref 0–200)
Basophils Relative: 0.8 %
Eosinophils Absolute: 208 cells/uL (ref 15–500)
Eosinophils Relative: 2.7 %
HCT: 42.9 % (ref 38.5–50.0)
Hemoglobin: 14.7 g/dL (ref 13.2–17.1)
Lymphs Abs: 1147 cells/uL (ref 850–3900)
MCH: 30.8 pg (ref 27.0–33.0)
MCHC: 34.3 g/dL (ref 32.0–36.0)
MCV: 89.9 fL (ref 80.0–100.0)
MPV: 12 fL (ref 7.5–12.5)
Monocytes Relative: 12.2 %
Neutro Abs: 5344 cells/uL (ref 1500–7800)
Neutrophils Relative %: 69.4 %
Platelets: 208 10*3/uL (ref 140–400)
RBC: 4.77 10*6/uL (ref 4.20–5.80)
RDW: 12.4 % (ref 11.0–15.0)
Total Lymphocyte: 14.9 %
WBC: 7.7 10*3/uL (ref 3.8–10.8)

## 2019-09-03 LAB — BASIC METABOLIC PANEL WITH GFR
BUN: 15 mg/dL (ref 7–25)
CO2: 26 mmol/L (ref 20–32)
Calcium: 9.7 mg/dL (ref 8.6–10.3)
Chloride: 105 mmol/L (ref 98–110)
Creat: 0.86 mg/dL (ref 0.70–1.11)
GFR, Est African American: 89 mL/min/{1.73_m2} (ref >=60)
GFR, Est Non African American: 77 mL/min/{1.73_m2} (ref >=60)
Glucose, Bld: 185 mg/dL — ABNORMAL HIGH (ref 65–99)
Potassium: 4 mmol/L (ref 3.5–5.3)
Sodium: 140 mmol/L (ref 135–146)

## 2019-09-03 LAB — HEMOGLOBIN A1C
Hgb A1c MFr Bld: 10.9 % of total Hgb — ABNORMAL HIGH (ref <5.7)
Mean Plasma Glucose: 266 (calc)
eAG (mmol/L): 14.7 (calc)

## 2019-09-03 NOTE — Progress Notes (Signed)
Blood counts are ok Electrolytes and kidneys are fine. Sugar has gone way up again with average now 10.9 meaning that if we average his sugars over the past 3 months, they've been 266.   It looks like he's just been on the lantus 45 units in the morning. We are going to need to make some changes at his appt.  Please make sure he brings his glucometer along.

## 2019-09-04 DIAGNOSIS — L0390 Cellulitis, unspecified: Secondary | ICD-10-CM | POA: Diagnosis not present

## 2019-09-07 ENCOUNTER — Ambulatory Visit (INDEPENDENT_AMBULATORY_CARE_PROVIDER_SITE_OTHER): Payer: Medicare Other | Admitting: Internal Medicine

## 2019-09-07 ENCOUNTER — Encounter: Payer: Self-pay | Admitting: Internal Medicine

## 2019-09-07 ENCOUNTER — Other Ambulatory Visit: Payer: Self-pay

## 2019-09-07 VITALS — BP 122/62 | HR 96 | Temp 97.5°F | Ht 67.0 in | Wt 147.0 lb

## 2019-09-07 DIAGNOSIS — H353233 Exudative age-related macular degeneration, bilateral, with inactive scar: Secondary | ICD-10-CM

## 2019-09-07 DIAGNOSIS — M170 Bilateral primary osteoarthritis of knee: Secondary | ICD-10-CM | POA: Diagnosis not present

## 2019-09-07 DIAGNOSIS — E1151 Type 2 diabetes mellitus with diabetic peripheral angiopathy without gangrene: Secondary | ICD-10-CM | POA: Diagnosis not present

## 2019-09-07 DIAGNOSIS — E1165 Type 2 diabetes mellitus with hyperglycemia: Secondary | ICD-10-CM

## 2019-09-07 DIAGNOSIS — E1142 Type 2 diabetes mellitus with diabetic polyneuropathy: Secondary | ICD-10-CM

## 2019-09-07 DIAGNOSIS — M7552 Bursitis of left shoulder: Secondary | ICD-10-CM | POA: Diagnosis not present

## 2019-09-07 MED ORDER — LANTUS SOLOSTAR 100 UNIT/ML ~~LOC~~ SOPN: PEN_INJECTOR | SUBCUTANEOUS | 3 refills | Status: DC

## 2019-09-07 MED ORDER — FREESTYLE LIBRE 14 DAY SENSOR MISC: 1.0000 | Freq: Three times a day (TID) | 2 refills | Status: DC

## 2019-09-07 MED ORDER — HUMALOG KWIKPEN 200 UNIT/ML ~~LOC~~ SOPN: 6.0000 [IU] | PEN_INJECTOR | Freq: Two times a day (BID) | SUBCUTANEOUS | 3 refills | Status: DC

## 2019-09-07 MED ORDER — FREESTYLE LIBRE 14 DAY READER DEVI: 1.0000 | Freq: Three times a day (TID) | 0 refills | Status: DC

## 2019-09-07 NOTE — Progress Notes (Signed)
Location:  [facility] clinic Provider:  Mckaila Duffus L. Mariea Clonts, D.O., C.M.D.  Code Status: DNR Goals of Care:  Advanced Directives 09/07/2019  Does Patient Have a Medical Advance Directive? Yes  Type of Advance Directive Out of facility DNR (pink MOST or yellow form)  Does patient want to make changes to medical advance directive? No - Patient declined  Copy of [place] in Chart? -  Would patient like information on creating a medical advance directive? -  Pre-existing out of facility DNR order (yellow form or pink MOST form) -   Chief Complaint  Patient presents with  . Medical Management of Chronic Issues    4 month follow up  . Acute Visit    Sugar is running high 186 o 09/07/2019    HPI: Patient is a 86 y.o. male seen today for medical management of chronic diseases.  hba1c has been very high again at 10.9.    He's had prednisone 3 times with his knees and his left shoulder bursitis.  No tears.  Sometimes it's ok, but he can hardly lift it b/c it hurts like a son of a gun.  His right arm is still ok.  Even with the shots in his knees, he staggers a lot.  He walks a lot every day and almost a mile total twice a day.  Not comfortable, but still does it.    Over the weekend, sat night, they had company with [place] broil cooked in oven, he went light on cream potatoes, had gravy from envelope, small piece of bundt cake with a little ice cream.  Sunday morning, he had two little blueberry muffins, coffee with splenda and 1% milk.  Lunch tomato/bacon/lettuce sandwich.  Supper was cereal.  CBGs high the whole time.   He's been avoiding bread most of the time.    This am, he had 1/2 banana, strawberries, splenda and 1% milk.  Coffee.  Had 1/2 biscuit with butter.  Saturdays and Wednesdays, they have eggs, bacon, grits.  48-50 (hard to see) units lantus.  He's checking two to three times a day right now and we need more information.  He's unable to see well to check his sugar and  to read his needles.  In [place], they told him there was nothing else they could do for his vision.  Goes every 6-8 mos, but they've been stable.  He had previously been having hemorrhages.  He had almost all of the different injections including eyelea.    Left leg excoriation from old grill that fell open.  He had cellulitis there and was treated with abx twice now.  It is healing gradually.  Got terrible bruising from the bandages.    Past Medical History:  Diagnosis Date  . Allergic rhinitis, cause unspecified   . Carpal tunnel syndrome   . Cervical spondylosis without myelopathy   . Cervical spondylosis without myelopathy   . Cervicalgia   . Diverticulosis of colon (without mention of hemorrhage)   . Esophageal reflux   . Hypertrophy of prostate with urinary obstruction and other lower urinary tract symptoms (LUTS)   . Insomnia, unspecified   . Internal hemorrhoids without mention of complication   . Irritable bowel syndrome   . Macular degeneration (senile) of retina, unspecified   . Malignant neoplasm of prostate ([place])   . Neurogenic bladder, NOS   . Neuropathy   . Nonspecific (abnormal) findings on radiological and other examination of gastrointestinal tract   . Orthostatic hypotension   .  Osteoarthrosis, unspecified whether generalized or localized, unspecified site   . Other and unspecified hyperlipidemia   . Other malaise and fatigue   . Other specified disorder of rectum and anus    Radiation Proctitus  . Paroxysmal supraventricular tachycardia ([place])   . Restless legs syndrome (RLS)   . Retinal detachment with retinal defect, unspecified   . Right bundle branch block    Incomplete  . Spermatocele    Right  . Trigger finger (acquired)   . Type II or unspecified type diabetes mellitus without mention of complication, not stated as uncontrolled   . Type II or unspecified type diabetes mellitus without mention of complication, uncontrolled   . Unspecified essential  hypertension    patient denies, states he has a "fast heart rate, but no high BP"  . Vitamin D deficiency     Past Surgical History:  Procedure Laterality Date  . Angiolipoma  1980   Right arm  . APPENDECTOMY  1951  . CHOLECYSTECTOMY  01/1994  . COLONOSCOPY  04/11/2010  . COLONOSCOPY W/ POLYPECTOMY  2005   Removed 2 (two) polyps  . DUPUYTREN CONTRACTURE RELEASE Bilateral 1989  . EYE SURGERY  2006   Left  . EYE SURGERY  2006   Retina reattachment, right  . EYE SURGERY  02/1994   Cataract surgery, right  . EYE SURGERY  01/1995   Cataract surgery, left  . KNEE SURGERY  1979  . PARATHYROIDECTOMY Left 12/27/2016   Procedure: LEFT INFERIOR PARATHYROIDECTOMY;  Surgeon: Armandina Gemma, MD;  Location: [facility];  Service: General;  Laterality: Left;  . POLYPECTOMY  1955   Vocal cord  . RETINAL DETACHMENT SURGERY Right 2005  . SHOULDER ARTHROSCOPY Right 03/22/15   [facility]  . TENDON RELEASE  01/1997   seven fingers  . TRANSURETHRAL RESECTION OF PROSTATE  08/1989  . VITRECTOMY Left 04/23/2013   [facility]    Allergies  Allergen Reactions  . Aspirin Other (See Comments)    Bothers stomach, thins blood   . Atorvastatin Other (See Comments)    Muscular pain and weakness  . Prednisone Other (See Comments)    Upset stomach, can take by shot not by mouth  . Simvastatin Other (See Comments)    Muscular pain and weakness  . Sulfa Antibiotics Other (See Comments)    Upset stomach   . Latex Rash  . Metformin Hcl Nausea Only  . Voltaren [Diclofenac Sodium] Other (See Comments)    Unknown    Outpatient Encounter Medications as of 09/07/2019  Medication Sig  . acetaminophen (TYLENOL) 500 MG tablet Take 1,000 mg by mouth 3 (three) times daily.  Marland Kitchen alum & mag hydroxide-simeth (MAALOX/MYLANTA) 200-200-20 MG/5ML suspension Take 30 mLs by mouth as needed for indigestion or heartburn.  . cephALEXin (KEFLEX) 500 MG capsule Take 500 mg by mouth every 12 (twelve) hours.  . cholecalciferol (VITAMIN  D) 400 units TABS tablet Take 400 Units by mouth daily.  Marland Kitchen docusate sodium (COLACE) 100 MG capsule Take 100 mg by mouth daily as needed.   . famotidine (PEPCID AC) 10 MG chewable tablet Chew 10 mg by mouth daily as needed for heartburn.  . furosemide (LASIX) 20 MG tablet Take 1 tablet (20 mg total) by mouth daily. For 3 days, then stop  . gabapentin (NEURONTIN) 300 MG capsule TAKE 1 CAPSULE BY MOUTH 3  TIMES DAILY  . insulin glargine (LANTUS SOLOSTAR) 100 UNIT/ML Solostar Pen INJECT SUBCUTANEOUSLY 45  UNITS IN THE MORNING  . Insulin Pen  Needle (B-D ULTRAFINE III SHORT PEN) 31G X 8 MM MISC Use once daily for insulin  . loperamide (IMODIUM) 1 MG/5ML solution Take 1 mg by mouth as needed for diarrhea or loose stools.  Marland Kitchen loratadine (CLARITIN) 10 MG tablet Take 10 mg by mouth daily as needed.   . metoprolol tartrate (LOPRESSOR) 25 MG tablet TAKE ONE-HALF TABLET BY  MOUTH TWICE DAILY  . mupirocin ointment (BACTROBAN) 2 % One application to affected area on right leg.  . Probiotic Product (PROBIOTIC-10) CHEW Chew 1 each by mouth daily.   . tamsulosin (FLOMAX) 0.4 MG CAPS capsule TAKE 1 CAPSULE BY MOUTH  DAILY FOR PROSTATE  . traMADol (ULTRAM) 50 MG tablet Take 1 tablet (50 mg total) by mouth every 6 (six) hours as needed for moderate pain.  Marland Kitchen trolamine salicylate (ASPERCREME) 10 % cream Apply 1 application topically as needed for muscle pain.   No facility-administered encounter medications on file as of 09/07/2019.    Review of Systems:  Review of Systems  Constitutional: Positive for malaise/fatigue. Negative for chills and fever.  HENT: Positive for hearing loss. Negative for congestion and sore throat.   Eyes: Positive for blurred vision.  Respiratory: Negative for cough and shortness of breath.   Cardiovascular: Negative for chest pain, palpitations and leg swelling.  Gastrointestinal: Negative for abdominal pain.  Genitourinary: Negative for dysuria.  Musculoskeletal: Positive for back pain  and joint pain. Negative for falls.  Skin: Negative for itching and rash.       Left leg abrasion  Neurological: Positive for weakness. Negative for dizziness and loss of consciousness.  Endo/Heme/Allergies: Bruises/bleeds easily.  Psychiatric/Behavioral: Negative for depression. The patient is not nervous/anxious and does not have insomnia.     Health Maintenance  Topic Date Due  . OPHTHALMOLOGY EXAM  08/27/2018  . URINE MICROALBUMIN  12/31/2018  . INFLUENZA VACCINE  08/23/2019  . HEMOGLOBIN A1C  03/04/2020  . FOOT EXAM  05/03/2020  . TETANUS/TDAP  06/28/2028  . COVID-19 Vaccine  Completed  . PNA vac Low Risk Adult  Completed    Physical Exam: Vitals:   09/07/19 0842  BP: 122/62  Pulse: 96  Temp: (!) 97.5 F (36.4 C)  TempSrc: Temporal  SpO2: 97%  Weight: 147 lb (66.7 kg)  Height: 5\' 7"  (1.702 m)   Body mass index is 23.02 kg/m. Physical Exam Vitals reviewed.  Constitutional:      General: He is not in acute distress.    Appearance: Normal appearance. He is not toxic-appearing.  HENT:     Head: Normocephalic and atraumatic.  Eyes:     Comments: Challenges reading--areas of words are absent--demonstrated for me today, makes use of regular glucometer challenging and drawing up his insulin--fortunately clicks of pens help  Cardiovascular:     Rate and Rhythm: Normal rate and regular rhythm.     Pulses: Normal pulses.     Heart sounds: Normal heart sounds.  Pulmonary:     Effort: Pulmonary effort is normal.     Breath sounds: Normal breath sounds. No wheezing, rhonchi or rales.  Abdominal:     General: Bowel sounds are normal.  Musculoskeletal:        General: Normal range of motion.     Right lower leg: No edema.     Left lower leg: No edema.     Comments: Very weak getting up out of chair even using cane, takes multiple tries to get up  Skin:    Comments: Left anterior shin  with abrasion present, mild surrounding erythema; several ecchymoses on either side  that he reports are from where dressings and tape were; mild tenderness, open area does have eschar which remains moist   Neurological:     General: No focal deficit present.     Mental Status: He is alert and oriented to person, place, and time.  Psychiatric:        Mood and Affect: Mood normal.     Comments: Seems less happy than prior visits      Labs reviewed: Basic Metabolic Panel: Recent Labs    12/24/18 0906 05/04/19 1153 09/02/19 0928  NA 141 140 140  K 4.2 4.2 4.0  CL 107 105 105  CO2 26 26 26   GLUCOSE 147* 153* 185*  BUN 15 13 15   CREATININE 1.02 0.90 0.86  CALCIUM 9.6 10.0 9.7   Liver Function Tests: Recent Labs    12/24/18 0906  AST 13  ALT 14  BILITOT 0.5  PROT 6.1   No results for input(s): LIPASE, AMYLASE in the last 8760 hours. No results for input(s): AMMONIA in the last 8760 hours. CBC: Recent Labs    12/24/18 0906 05/04/19 1153 09/02/19 0928  WBC 6.2 5.3 7.7  NEUTROABS 4,303 3,503 5,344  HGB 13.9 14.5 14.7  HCT 40.8 41.7 42.9  MCV 89.3 89.7 89.9  PLT 200 208 208   Lipid Panel: No results for input(s): CHOL, HDL, LDLCALC, TRIG, CHOLHDL, LDLDIRECT in the last 8760 hours. Lab Results  Component Value Date   HGBA1C 10.9 (H) 09/02/2019    Procedures since last visit: No results found.  Assessment/Plan 1. DM (diabetes mellitus), type 2 with peripheral vascular complications ([place]) -try to get him freestyle libre due to his challenges reading and checking his glucose due to severe macular degeneration -Increase lantus to 50 units in the morning Eat your breakfast, check your sugar, then take 6 units of humalog Go through your day Eat your supper, check your sugar,  then take 6 units of humalog  2. Diabetic polyneuropathy associated with type 2 diabetes mellitus ([place]) -ongoing, no changes to neuropathy  3. Bursitis of left shoulder -s/p injection of steroid which impacted glucose   4. Bilateral primary osteoarthritis of  knee -ongoing, cont tylenol, topicals, staying as active as body allows, use of cane  5. Hyperglycemia due to diabetes mellitus (H[facility]) -hba1c is elevated to over 10 now -average CBGs are in upper 200s at this time  -add humalog  6. Exudative age-related macular degeneration, bilateral, with inactive scar ([place]) -continues follow-ups with specialist, but no longer eligible for injections, vision quite poor and makes his diabetes mgt more challenging, would benefit from freestyle libre   Labs/tests ordered:   Lab Orders     Hemoglobin A1c     COMPLETE METABOLIC PANEL WITH GFR  Next appt:  6 wks re: sugar, 4 mos with me with lab before  Melo Stauber L. Kieli Golladay, D.O. [facility] [address], [place] [address] Cell Phone (Mon-Fri 8am-5pm):  [phone] On Call:  [phone] & follow prompts after 5pm & weekends Office Phone:  [phone] Office Fax:  [phone]

## 2019-09-07 NOTE — Patient Instructions (Addendum)
Increase lantus to 50 units in the morning Eat your breakfast, check your sugar, then take 6 units of humalog Go through your day Eat your supper, check your sugar,  then take 6 units of humalog  Stop putting alcohol and any antibiotic ointment on your leg.

## 2019-09-15 ENCOUNTER — Other Ambulatory Visit: Payer: Self-pay

## 2019-09-15 ENCOUNTER — Encounter: Payer: Self-pay | Admitting: Nurse Practitioner

## 2019-09-15 ENCOUNTER — Telehealth: Payer: Self-pay

## 2019-09-15 ENCOUNTER — Ambulatory Visit (INDEPENDENT_AMBULATORY_CARE_PROVIDER_SITE_OTHER): Payer: Medicare Other | Admitting: Nurse Practitioner

## 2019-09-15 DIAGNOSIS — Z0000 Encounter for general adult medical examination without abnormal findings: Secondary | ICD-10-CM | POA: Diagnosis not present

## 2019-09-15 NOTE — Progress Notes (Signed)
This service is provided via telemedicine  No vital signs collected/recorded due to the encounter was a telemedicine visit.   Location of patient (ex: home, work):  Home  Patient consents to a telephone visit:  Yes, see encounter dated 09/15/2019  Location of the provider (ex: office, home):  [place]  Name of any referring provider:  Hollace Kinnier, DO  Names of all persons participating in the telemedicine service and their role in the encounter:  Sherrie Mustache, Nurse Practitioner, Carroll Kinds, CMA, patient and wife Estill Bamberg.  Time spent on call:  20 minutes with medical assistant and nurse practitioner

## 2019-09-15 NOTE — Patient Instructions (Signed)
Micheal [name] , Thank you for taking time to come for your Medicare Wellness Visit. I appreciate your ongoing commitment to your health goals. Please review the following plan we discussed and let me know if I can assist you in the future.   Screening recommendations/referrals: Colonoscopy aged out Recommended yearly ophthalmology/optometry visit for glaucoma screening and checkup Recommended yearly dental visit for hygiene and checkup  Vaccinations: Influenza vaccine DUE at this time Pneumococcal vaccine up to date Tdap vaccine up to date Shingles vaccine RECOMMENDED to get shingrix at local pharmacy     Advanced directives: on file.   Conditions/risks identified: fall risk, decreased vision, advanced age, memory loss  Next appointment: 1 year for awv  Preventive Care 84 Years and Older, Male Preventive care refers to lifestyle choices and visits with your health care provider that can promote health and wellness. What does preventive care include?  A yearly physical exam. This is also called an annual well check.  Dental exams once or twice a year.  Routine eye exams. Ask your health care provider how often you should have your eyes checked.  Personal lifestyle choices, including:  Daily care of your teeth and gums.  Regular physical activity.  Eating a healthy diet.  Avoiding tobacco and drug use.  Limiting alcohol use.  Practicing safe sex.  Taking low doses of aspirin every day.  Taking vitamin and mineral supplements as recommended by your health care provider. What happens during an annual well check? The services and screenings done by your health care provider during your annual well check will depend on your age, overall health, lifestyle risk factors, and family history of disease. Counseling  Your health care provider may ask you questions about your:  Alcohol use.  Tobacco use.  Drug use.  Emotional well-being.  Home and relationship well-being.   Sexual activity.  Eating habits.  History of falls.  Memory and ability to understand (cognition).  Work and work Statistician. Screening  You may have the following tests or measurements:  Height, weight, and BMI.  Blood pressure.  Lipid and cholesterol levels. These may be checked every 5 years, or more frequently if you are over 35 years old.  Skin check.  Lung cancer screening. You may have this screening every year starting at age 84 if you have a 30-pack-year history of smoking and currently smoke or have quit within the past 15 years.  Fecal occult blood test (FOBT) of the stool. You may have this test every year starting at age 84.  Flexible sigmoidoscopy or colonoscopy. You may have a sigmoidoscopy every 5 years or a colonoscopy every 10 years starting at age 84.  Prostate cancer screening. Recommendations will vary depending on your family history and other risks.  Hepatitis C blood test.  Hepatitis B blood test.  Sexually transmitted disease (STD) testing.  Diabetes screening. This is done by checking your blood sugar (glucose) after you have not eaten for a while (fasting). You may have this done every 1-3 years.  Abdominal aortic aneurysm (AAA) screening. You may need this if you are a current or former smoker.  Osteoporosis. You may be screened starting at age 60 if you are at high risk. Talk with your health care provider about your test results, treatment options, and if necessary, the need for more tests. Vaccines  Your health care provider may recommend certain vaccines, such as:  Influenza vaccine. This is recommended every year.  Tetanus, diphtheria, and acellular pertussis (Tdap, Td) vaccine.  You may need a Td booster every 10 years.  Zoster vaccine. You may need this after age 13.  Pneumococcal 13-valent conjugate (PCV13) vaccine. One dose is recommended after age 69.  Pneumococcal polysaccharide (PPSV23) vaccine. One dose is recommended  after age 52. Talk to your health care provider about which screenings and vaccines you need and how often you need them. This information is not intended to replace advice given to you by your health care provider. Make sure you discuss any questions you have with your health care provider. Document Released: 02/04/2015 Document Revised: 09/28/2015 Document Reviewed: 11/09/2014 Elsevier Interactive Patient Education  2017 [place] Prevention in the Home Falls can cause injuries. They can happen to people of all ages. There are many things you can do to make your home safe and to help prevent falls. What can I do on the outside of my home?  Regularly fix the edges of walkways and driveways and fix any cracks.  Remove anything that might make you trip as you walk through a door, such as a raised step or threshold.  Trim any bushes or trees on the path to your home.  Use bright outdoor lighting.  Clear any walking paths of anything that might make someone trip, such as rocks or tools.  Regularly check to see if handrails are loose or broken. Make sure that both sides of any steps have handrails.  Any raised decks and porches should have guardrails on the edges.  Have any leaves, snow, or ice cleared regularly.  Use sand or salt on walking paths during winter.  Clean up any spills in your garage right away. This includes oil or grease spills. What can I do in the bathroom?  Use night lights.  Install grab bars by the toilet and in the tub and shower. Do not use towel bars as grab bars.  Use non-skid mats or decals in the tub or shower.  If you need to sit down in the shower, use a plastic, non-slip stool.  Keep the floor dry. Clean up any water that spills on the floor as soon as it happens.  Remove soap buildup in the tub or shower regularly.  Attach bath mats securely with double-sided non-slip rug tape.  Do not have throw rugs and other things on the floor  that can make you trip. What can I do in the bedroom?  Use night lights.  Make sure that you have a light by your bed that is easy to reach.  Do not use any sheets or blankets that are too big for your bed. They should not hang down onto the floor.  Have a firm chair that has side arms. You can use this for support while you get dressed.  Do not have throw rugs and other things on the floor that can make you trip. What can I do in the kitchen?  Clean up any spills right away.  Avoid walking on wet floors.  Keep items that you use a lot in easy-to-reach places.  If you need to reach something above you, use a strong step stool that has a grab bar.  Keep electrical cords out of the way.  Do not use floor polish or wax that makes floors slippery. If you must use wax, use non-skid floor wax.  Do not have throw rugs and other things on the floor that can make you trip. What can I do with my stairs?  Do not leave any  items on the stairs.  Make sure that there are handrails on both sides of the stairs and use them. Fix handrails that are broken or loose. Make sure that handrails are as long as the stairways.  Check any carpeting to make sure that it is firmly attached to the stairs. Fix any carpet that is loose or worn.  Avoid having throw rugs at the top or bottom of the stairs. If you do have throw rugs, attach them to the floor with carpet tape.  Make sure that you have a light switch at the top of the stairs and the bottom of the stairs. If you do not have them, ask someone to add them for you. What else can I do to help prevent falls?  Wear shoes that:  Do not have high heels.  Have rubber bottoms.  Are comfortable and fit you well.  Are closed at the toe. Do not wear sandals.  If you use a stepladder:  Make sure that it is fully opened. Do not climb a closed stepladder.  Make sure that both sides of the stepladder are locked into place.  Ask someone to hold it  for you, if possible.  Clearly mark and make sure that you can see:  Any grab bars or handrails.  First and last steps.  Where the edge of each step is.  Use tools that help you move around (mobility aids) if they are needed. These include:  Canes.  Walkers.  Scooters.  Crutches.  Turn on the lights when you go into a dark area. Replace any light bulbs as soon as they burn out.  Set up your furniture so you have a clear path. Avoid moving your furniture around.  If any of your floors are uneven, fix them.  If there are any pets around you, be aware of where they are.  Review your medicines with your doctor. Some medicines can make you feel dizzy. This can increase your chance of falling. Ask your doctor what other things that you can do to help prevent falls. This information is not intended to replace advice given to you by your health care provider. Make sure you discuss any questions you have with your health care provider. Document Released: 11/04/2008 Document Revised: 06/16/2015 Document Reviewed: 02/12/2014 Elsevier Interactive Patient Education  2017 [employer].

## 2019-09-15 NOTE — Telephone Encounter (Signed)
Mr. dexton, zwilling are scheduled for a virtual visit with your provider today.    Just as we do with appointments in the office, we must obtain your consent to participate.  Your consent will be active for this visit and any virtual visit you may have with one of our providers in the next 365 days.    If you have a MyChart account, I can also send a copy of this consent to you electronically.  All virtual visits are billed to your insurance company just like a traditional visit in the office.  As this is a virtual visit, video technology does not allow for your provider to perform a traditional examination.  This may limit your provider's ability to fully assess your condition.  If your provider identifies any concerns that need to be evaluated in person or the need to arrange testing such as labs, EKG, etc, we will make arrangements to do so.    Although advances in technology are sophisticated, we cannot ensure that it will always work on either your end or our end.  If the connection with a video visit is poor, we may have to switch to a telephone visit.  With either a video or telephone visit, we are not always able to ensure that we have a secure connection.   I need to obtain your verbal consent now.   Are you willing to proceed with your visit today?   ALONZA KNISLEY has provided verbal consent on 09/15/2019 for a virtual visit (video or telephone).   Carroll Kinds, [facility] 09/15/2019  2:23 PM

## 2019-09-15 NOTE — Progress Notes (Signed)
Subjective:   Micheal [name] is a 86 y.o. male who presents for Medicare Annual/Subsequent preventive examination.  Review of Systems     Cardiac Risk Factors include: advanced age (>57men, >41 women);diabetes mellitus;sedentary lifestyle     Objective:    There were no vitals filed for this visit. There is no height or weight on file to calculate BMI.  Advanced Directives 09/15/2019 09/07/2019 05/21/2019 05/04/2019 12/29/2018 07/10/2018 07/10/2018  Does Patient Have a Medical Advance Directive? Yes Yes Yes Yes Yes Yes No  Type of Advance Directive Living will;Out of facility DNR (pink MOST or yellow form) Out of facility DNR (pink MOST or yellow form) Out of facility DNR (pink MOST or yellow form);Living will Living will Living will [place] -  Does patient want to make changes to medical advance directive? No - Patient declined No - Patient declined No - Patient declined No - Patient declined No - Patient declined No - Patient declined -  Copy of [place] in [place]  Would patient like information on creating a medical advance directive? - - - - - - No - Patient declined  Pre-existing out of facility DNR order (yellow form or pink MOST form) Yellow form placed in chart (order not valid for inpatient use) - Pink MOST/Yellow Form most recent copy in chart - Physician notified to receive inpatient order - - - -    Current Medications (verified) Outpatient Encounter Medications as of 09/15/2019  Medication Sig  . acetaminophen (TYLENOL) 500 MG tablet Take 1,000 mg by mouth 3 (three) times daily.  Marland Kitchen alum & mag hydroxide-simeth (MAALOX/MYLANTA) 200-200-20 MG/5ML suspension Take 30 mLs by mouth as needed for indigestion or heartburn.  . cholecalciferol (VITAMIN D) 400 units TABS tablet Take 400 Units by mouth daily.  . Continuous Blood Gluc Receiver (FREESTYLE LIBRE 14 DAY READER) DEVI 1 Device by Does not apply route 3 (three) times daily.    . Continuous Blood Gluc Sensor (FREESTYLE LIBRE 14 DAY SENSOR) MISC 1 applicator by Does not apply route 3 (three) times daily.  Marland Kitchen docusate sodium (COLACE) 100 MG capsule Take 100 mg by mouth daily as needed.   . famotidine (PEPCID AC) 10 MG chewable tablet Chew 10 mg by mouth daily as needed for heartburn.  . furosemide (LASIX) 20 MG tablet Take 1 tablet (20 mg total) by mouth daily. For 3 days, then stop  . gabapentin (NEURONTIN) 300 MG capsule TAKE 1 CAPSULE BY MOUTH 3  TIMES DAILY  . insulin glargine (LANTUS SOLOSTAR) 100 UNIT/ML Solostar Pen INJECT SUBCUTANEOUSLY 50 UNITS IN THE MORNING  . insulin lispro (HUMALOG KWIKPEN) 200 UNIT/ML KwikPen Inject 6 Units into the skin 2 (two) times daily after a meal.  . Insulin Pen Needle (B-D ULTRAFINE III SHORT PEN) 31G X 8 MM MISC Use once daily for insulin  . loperamide (IMODIUM) 1 MG/5ML solution Take 1 mg by mouth as needed for diarrhea or loose stools.  Marland Kitchen loratadine (CLARITIN) 10 MG tablet Take 10 mg by mouth daily as needed.   . metoprolol tartrate (LOPRESSOR) 25 MG tablet TAKE ONE-HALF TABLET BY  MOUTH TWICE DAILY  . mupirocin ointment (BACTROBAN) 2 % One application to affected area on right leg.  . Probiotic Product (PROBIOTIC-10) CHEW Chew 1 each by mouth daily.   . tamsulosin (FLOMAX) 0.4 MG CAPS capsule TAKE 1 CAPSULE BY MOUTH  DAILY FOR PROSTATE  . traMADol (ULTRAM) 50 MG tablet  Take 1 tablet (50 mg total) by mouth every 6 (six) hours as needed for moderate pain.  Marland Kitchen trolamine salicylate (ASPERCREME) 10 % cream Apply 1 application topically as needed for muscle pain.  . [DISCONTINUED] cephALEXin (KEFLEX) 500 MG capsule Take 500 mg by mouth every 12 (twelve) hours.   No facility-administered encounter medications on file as of 09/15/2019.    Allergies (verified) Aspirin, Atorvastatin, Prednisone, Simvastatin, Sulfa antibiotics, Latex, Metformin hcl, and Voltaren [diclofenac sodium]   History: Past Medical History:  Diagnosis Date  .  Allergic rhinitis, cause unspecified   . Carpal tunnel syndrome   . Cervical spondylosis without myelopathy   . Cervical spondylosis without myelopathy   . Cervicalgia   . Diverticulosis of colon (without mention of hemorrhage)   . Esophageal reflux   . Hypertrophy of prostate with urinary obstruction and other lower urinary tract symptoms (LUTS)   . Insomnia, unspecified   . Internal hemorrhoids without mention of complication   . Irritable bowel syndrome   . Macular degeneration (senile) of retina, unspecified   . Malignant neoplasm of prostate ([place])   . Neurogenic bladder, NOS   . Neuropathy   . Nonspecific (abnormal) findings on radiological and other examination of gastrointestinal tract   . Orthostatic hypotension   . Osteoarthrosis, unspecified whether generalized or localized, unspecified site   . Other and unspecified hyperlipidemia   . Other malaise and fatigue   . Other specified disorder of rectum and anus    Radiation Proctitus  . Paroxysmal supraventricular tachycardia ([place])   . Restless legs syndrome (RLS)   . Retinal detachment with retinal defect, unspecified   . Right bundle branch block    Incomplete  . Spermatocele    Right  . Trigger finger (acquired)   . Type II or unspecified type diabetes mellitus without mention of complication, not stated as uncontrolled   . Type II or unspecified type diabetes mellitus without mention of complication, uncontrolled   . Unspecified essential hypertension    patient denies, states he has a "fast heart rate, but no high BP"  . Vitamin D deficiency    Past Surgical History:  Procedure Laterality Date  . Angiolipoma  1980   Right arm  . APPENDECTOMY  1951  . CHOLECYSTECTOMY  01/1994  . COLONOSCOPY  04/11/2010  . COLONOSCOPY W/ POLYPECTOMY  2005   Removed 2 (two) polyps  . DUPUYTREN CONTRACTURE RELEASE Bilateral 1989  . EYE SURGERY  2006   Left  . EYE SURGERY  2006   Retina reattachment, right  . EYE SURGERY   02/1994   Cataract surgery, right  . EYE SURGERY  01/1995   Cataract surgery, left  . KNEE SURGERY  1979  . PARATHYROIDECTOMY Left 12/27/2016   Procedure: LEFT INFERIOR PARATHYROIDECTOMY;  Surgeon: Armandina Gemma, MD;  Location: [facility];  Service: General;  Laterality: Left;  . POLYPECTOMY  1955   Vocal cord  . RETINAL DETACHMENT SURGERY Right 2005  . SHOULDER ARTHROSCOPY Right 03/22/15   [facility]  . TENDON RELEASE  01/1997   seven fingers  . TRANSURETHRAL RESECTION OF PROSTATE  08/1989  . VITRECTOMY Left 04/23/2013   [facility]   Family History  Problem Relation Age of Onset  . Depression Mother   . Diabetes Mother   . Mental illness Mother        OCD  . Cerebral aneurysm Father        age 59  . Hypertension Sister   . Diabetes Daughter   .  Hyperlipidemia Daughter   . Breast cancer Daughter   . Breast cancer Daughter   . Hyperparathyroidism Neg Hx    Social History   Socioeconomic History  . Marital status: Married    Spouse name: Not on file  . Number of children: 4  . Years of education: 10th  . Highest education level: Not on file  Occupational History  . Occupation: Retired  Tobacco Use  . Smoking status: Former Smoker    Years: 4.00    Types: Cigarettes    Quit date: 06/09/1948    Years since quitting: 71.3  . Smokeless tobacco: Never Used  . Tobacco comment: 1 pack/wk  Vaping Use  . Vaping Use: Never used  Substance and Sexual Activity  . Alcohol use: No    Alcohol/week: 0.0 standard drinks  . Drug use: No  . Sexual activity: Never  Other Topics Concern  . Not on file  Social History Narrative   Married   Never smoked   Alcohol none   Exercise walks dog twice a day   Living will   Left-handed   2 cups caffeine per day      Social Determinants of Health   Financial Resource Strain:   . Difficulty of Paying Living Expenses: Not on file  Food Insecurity:   . Worried About Charity fundraiser in the Last Year: Not on file  . Ran Out of Food in  the Last Year: Not on file  Transportation Needs:   . Lack of Transportation (Medical): Not on file  . Lack of Transportation (Non-Medical): Not on file  Physical Activity:   . Days of Exercise per Week: Not on file  . Minutes of Exercise per Session: Not on file  Stress:   . Feeling of Stress : Not on file  Social Connections:   . Frequency of Communication with Friends and Family: Not on file  . Frequency of Social Gatherings with Friends and Family: Not on file  . Attends Religious Services: Not on file  . Active Member of Clubs or Organizations: Not on file  . Attends Archivist Meetings: Not on file  . Marital Status: Not on file    Tobacco Counseling Counseling given: Not Answered Comment: 1 pack/wk   Clinical Intake:  Pre-visit preparation completed: Yes  Pain : No/denies pain     BMI - recorded: 23 Nutritional Status: BMI of 19-24  Normal Nutritional Risks: Unintentional weight gain, Non-healing wound Diabetes: Yes  How often do you need to have someone help you when you read instructions, pamphlets, or other written materials from your doctor or pharmacy?: 5 - Always (unable to read due to macular degeneration)  Diabetic?yes         Activities of Daily Living In your present state of health, do you have any difficulty performing the following activities: 09/15/2019  Hearing? Y  Vision? Y  Difficulty concentrating or making decisions? Y  Walking or climbing stairs? Y  Comment uses cane  Dressing or bathing? N  Doing errands, shopping? Y  Comment unable to drive  Preparing Food and eating ? N  Using the Toilet? N  In the past six months, have you accidently leaked urine? N  Do you have problems with loss of bowel control? N  Managing your Medications? Y  Managing your Finances? Y  Housekeeping or managing your Housekeeping? Y  Some recent data might be hidden    Patient Care Team: Gayland Curry, DO as PCP -  General (Geriatric  Medicine) Netta Cedars, MD as Consulting Physician (Orthopedic Surgery) Jasmine Awe, MD as Referring Physician (Ophthalmology) Rana Snare, MD as Consulting Physician (Urology)  Indicate any recent Medical Services you may have received from other than [facility] providers in the past year (date may be approximate).     Assessment:   This is a routine wellness examination for [place].  Hearing/Vision screen  Hearing Screening   125Hz  250Hz  500Hz  1000Hz  2000Hz  3000Hz  4000Hz  6000Hz  8000Hz   Right ear:           Left ear:           Comments: Patient has problems with hearing. Patient is using wife's hearing aids. Patient has not gotten any new ones because of cost.  Vision Screening Comments: Patient states he has wet macular degeneration. Patient wears glasses. Glasses really don't help. Patient sees eye doctor every 6 to 8 months.  Dietary issues and exercise activities discussed: Current Exercise Habits: Home exercise routine, Type of exercise: walking, Time (Minutes): 30, Frequency (Times/Week): 7, Weekly Exercise (Minutes/Week): 210  Goals    .  <enter goal here> (pt-stated)      Starting 02/01/16, I will maintain my current lifestyle.       Depression Screen PHQ 2/9 Scores 09/15/2019 09/07/2019 05/21/2019 02/13/2019 12/29/2018 09/04/2018 08/14/2018  PHQ - 2 Score 0 0 0 0 0 0 0    Fall Risk Fall Risk  09/15/2019 09/07/2019 05/21/2019 05/04/2019 02/13/2019  Falls in the past year? 0 0 0 0 0  Number falls in past yr: 0 1 0 0 0  Injury with Fall? 0 0 0 0 0  Risk for fall due to : - - - - -    Any stairs in or around the home? Yes  If so, are there any without handrails? No  Home free of loose throw rugs in walkways, pet beds, electrical cords, etc? Yes  Adequate lighting in your home to reduce risk of falls? No   ASSISTIVE DEVICES UTILIZED TO PREVENT FALLS:  Life alert? No  Use of a cane, walker or w/c? Yes  Grab bars in the bathroom? Yes  Shower chair or bench in shower?  Yes  Elevated toilet seat or a handicapped toilet? Yes   TIMED UP AND GO:  na  Cognitive Function: MMSE - Mini Mental State Exam 03/07/2018 03/04/2017 02/01/2016  Not completed: - (No Data) Unable to complete  Orientation to time 4 4 5   Orientation to Place 5 4 5   Registration 3 3 3   Attention/ Calculation 4 4 3   Recall 1 0 0  Language- name 2 objects 2 2 2   Language- repeat 1 1 1   Language- follow 3 step command 2 3 0  Language- read & follow direction 1 1 0  Write a sentence 1 1 0  Copy design 1 0 0  Total score 25 23 19      6CIT Screen 09/15/2019  What Year? 4 points  What month? 0 points  What time? 3 points  Count back from 20 0 points  Months in reverse 4 points  Repeat phrase 6 points  Total Score 17    Immunizations Immunization History  Administered Date(s) Administered  . Influenza, High Dose Seasonal PF 10/19/2016, 11/01/2017, 10/16/2018  . Influenza,inj,Quad PF,6+ Mos 10/29/2012, 10/28/2013  . Influenza-Unspecified 10/25/2010, 11/07/2011, 10/07/2015  . PFIZER SARS-COV-2 Vaccination 03/22/2019, 04/15/2019  . Pneumococcal Conjugate-13 05/18/2014  . Pneumococcal Polysaccharide-23 01/23/2007  . Td 08/23/2010  . Tdap 06/29/2018  TDAP status: Up to date Flu Vaccine status: Up to date Pneumococcal vaccine status: Up to date Covid-19 vaccine status: Completed vaccines  Qualifies for Shingles Vaccine? yes Zostavax completed No   Shingrix Completed?: No.    Education has been provided regarding the importance of this vaccine. Patient has been advised to call insurance company to determine out of pocket expense if they have not yet received this vaccine. Advised may also receive vaccine at local pharmacy or Health Dept. Verbalized acceptance and understanding.  Screening Tests Health Maintenance  Topic Date Due  . URINE MICROALBUMIN  12/31/2018  . INFLUENZA VACCINE  08/23/2019  . HEMOGLOBIN A1C  03/04/2020  . OPHTHALMOLOGY EXAM  03/22/2020  . FOOT EXAM   05/03/2020  . TETANUS/TDAP  06/28/2028  . COVID-19 Vaccine  Completed  . PNA vac Low Risk Adult  Completed    Health Maintenance  Health Maintenance Due  Topic Date Due  . URINE MICROALBUMIN  12/31/2018  . INFLUENZA VACCINE  08/23/2019    Colorectal cancer screening: No longer required.   Lung Cancer Screening: (Low Dose CT Chest recommended if Age 28-80 years, 30 pack-year currently smoking OR have quit w/in 15years.) does not qualify.   Lung Cancer Screening Referral: na  Additional Screening:  Hepatitis C Screening: does not qualify; Completed na  Vision Screening: Recommended annual ophthalmology exams for early detection of glaucoma and other disorders of the eye. Is the patient up to date with their annual eye exam?  No  Who is the provider or what is the name of the office in which the patient attends annual eye exams? Dr Garwin Brothers If pt is not established with a provider, would they like to be referred to a provider to establish care? No .   Dental Screening: Recommended annual dental exams for proper oral hygiene  Community Resource Referral / Chronic Care Management: CRR required this visit?  No   CCM required this visit?  No      Plan:     I have personally reviewed and noted the following in the patient's chart:   . Medical and social history . Use of alcohol, tobacco or illicit drugs  . Current medications and supplements . Functional ability and status . Nutritional status . Physical activity . Advanced directives . List of other physicians . Hospitalizations, surgeries, and ER visits in previous 12 months . Vitals . Screenings to include cognitive, depression, and falls . Referrals and appointments  In addition, I have reviewed and discussed with patient certain preventive protocols, quality metrics, and best practice recommendations. A written personalized care plan for preventive services as well as general preventive health recommendations were  provided to patient.     Lauree Chandler, NP   09/15/2019

## 2019-09-16 DIAGNOSIS — D225 Melanocytic nevi of trunk: Secondary | ICD-10-CM | POA: Diagnosis not present

## 2019-09-16 DIAGNOSIS — L570 Actinic keratosis: Secondary | ICD-10-CM | POA: Diagnosis not present

## 2019-09-16 DIAGNOSIS — L821 Other seborrheic keratosis: Secondary | ICD-10-CM | POA: Diagnosis not present

## 2019-09-16 DIAGNOSIS — X32XXXD Exposure to sunlight, subsequent encounter: Secondary | ICD-10-CM | POA: Diagnosis not present

## 2019-10-20 ENCOUNTER — Encounter (INDEPENDENT_AMBULATORY_CARE_PROVIDER_SITE_OTHER): Payer: Medicare Other | Admitting: Ophthalmology

## 2019-10-21 DIAGNOSIS — M25512 Pain in left shoulder: Secondary | ICD-10-CM | POA: Diagnosis not present

## 2019-10-23 ENCOUNTER — Other Ambulatory Visit: Payer: Self-pay | Admitting: *Deleted

## 2019-10-23 DIAGNOSIS — E1151 Type 2 diabetes mellitus with diabetic peripheral angiopathy without gangrene: Secondary | ICD-10-CM

## 2019-10-23 MED ORDER — BD PEN NEEDLE SHORT U/F 31G X 8 MM MISC: 3 refills | Status: DC

## 2019-10-23 NOTE — Telephone Encounter (Signed)
[facility] requested refill.  ? ?

## 2019-10-26 ENCOUNTER — Ambulatory Visit (INDEPENDENT_AMBULATORY_CARE_PROVIDER_SITE_OTHER): Payer: Medicare Other | Admitting: Internal Medicine

## 2019-10-26 ENCOUNTER — Other Ambulatory Visit: Payer: Self-pay

## 2019-10-26 ENCOUNTER — Other Ambulatory Visit: Payer: Self-pay | Admitting: *Deleted

## 2019-10-26 ENCOUNTER — Encounter: Payer: Self-pay | Admitting: Internal Medicine

## 2019-10-26 ENCOUNTER — Ambulatory Visit: Payer: Medicare Other | Admitting: Internal Medicine

## 2019-10-26 VITALS — BP 122/82 | HR 67 | Temp 97.7°F | Ht 67.0 in | Wt 151.4 lb

## 2019-10-26 DIAGNOSIS — H353233 Exudative age-related macular degeneration, bilateral, with inactive scar: Secondary | ICD-10-CM | POA: Diagnosis not present

## 2019-10-26 DIAGNOSIS — M170 Bilateral primary osteoarthritis of knee: Secondary | ICD-10-CM | POA: Diagnosis not present

## 2019-10-26 DIAGNOSIS — E1151 Type 2 diabetes mellitus with diabetic peripheral angiopathy without gangrene: Secondary | ICD-10-CM | POA: Diagnosis not present

## 2019-10-26 DIAGNOSIS — D692 Other nonthrombocytopenic purpura: Secondary | ICD-10-CM

## 2019-10-26 DIAGNOSIS — M7552 Bursitis of left shoulder: Secondary | ICD-10-CM | POA: Diagnosis not present

## 2019-10-26 DIAGNOSIS — Z23 Encounter for immunization: Secondary | ICD-10-CM | POA: Diagnosis not present

## 2019-10-26 MED ORDER — HUMALOG KWIKPEN 200 UNIT/ML ~~LOC~~ SOPN: 10.0000 [IU] | PEN_INJECTOR | Freq: Two times a day (BID) | SUBCUTANEOUS | 3 refills | Status: DC

## 2019-10-26 MED ORDER — BD PEN NEEDLE SHORT U/F 31G X 8 MM MISC: 3 refills | Status: DC

## 2019-10-26 NOTE — Patient Instructions (Addendum)
Increase your mealtime insulin (humalog) to 10 units twice a day with meals.  Keep your lantus the same.    Try to avoid too many starchy foods together.    [place] will provide third COVID-19 Vaccine Booster shots will be at all [place] vaccination clinics at [email or web address] You may visit that website or call [phone] Monday thru Friday from 7am to 7pm.

## 2019-10-26 NOTE — Telephone Encounter (Signed)
Wife, Estill Bamberg called and stated that [facility] did not receive Rx for Pen Needles. Stated that he is using them 3 times daily.  Refaxed.
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
## 2019-10-27 ENCOUNTER — Ambulatory Visit: Payer: Medicare Other

## 2019-10-27 LAB — MICROALBUMIN / CREATININE URINE RATIO
Creatinine, Urine: 16 mg/dL — ABNORMAL LOW (ref 20–320)
Microalb Creat Ratio: 31 mcg/mg creat — ABNORMAL HIGH (ref <30)
Microalb, Ur: 0.5 mg/dL

## 2019-10-27 NOTE — Progress Notes (Signed)
He has just a little protein spilling into his urine.  We will monitor this.  I'd rather not add another blood pressure medication (ace/arb) to his regimen that would lower his blood pressure and increase his fall risk.

## 2019-10-28 DIAGNOSIS — Z23 Encounter for immunization: Secondary | ICD-10-CM | POA: Diagnosis not present

## 2019-11-09 ENCOUNTER — Encounter (INDEPENDENT_AMBULATORY_CARE_PROVIDER_SITE_OTHER): Payer: Medicare Other | Admitting: Ophthalmology

## 2019-11-09 ENCOUNTER — Other Ambulatory Visit: Payer: Self-pay

## 2019-11-09 DIAGNOSIS — I10 Essential (primary) hypertension: Secondary | ICD-10-CM

## 2019-11-09 DIAGNOSIS — H338 Other retinal detachments: Secondary | ICD-10-CM | POA: Diagnosis not present

## 2019-11-09 DIAGNOSIS — H353231 Exudative age-related macular degeneration, bilateral, with active choroidal neovascularization: Secondary | ICD-10-CM

## 2019-11-09 DIAGNOSIS — H35033 Hypertensive retinopathy, bilateral: Secondary | ICD-10-CM

## 2019-11-09 DIAGNOSIS — H43813 Vitreous degeneration, bilateral: Secondary | ICD-10-CM

## 2019-12-07 ENCOUNTER — Other Ambulatory Visit: Payer: Self-pay

## 2019-12-07 ENCOUNTER — Encounter: Payer: Self-pay | Admitting: Internal Medicine

## 2019-12-07 ENCOUNTER — Ambulatory Visit (INDEPENDENT_AMBULATORY_CARE_PROVIDER_SITE_OTHER): Payer: Medicare Other | Admitting: Internal Medicine

## 2019-12-07 VITALS — BP 122/62 | HR 94 | Temp 97.8°F | Ht 67.0 in | Wt 152.0 lb

## 2019-12-07 DIAGNOSIS — E1151 Type 2 diabetes mellitus with diabetic peripheral angiopathy without gangrene: Secondary | ICD-10-CM | POA: Diagnosis not present

## 2019-12-07 DIAGNOSIS — D692 Other nonthrombocytopenic purpura: Secondary | ICD-10-CM

## 2019-12-07 DIAGNOSIS — M1712 Unilateral primary osteoarthritis, left knee: Secondary | ICD-10-CM

## 2019-12-07 MED ORDER — METHYLPREDNISOLONE ACETATE 80 MG/ML IJ SUSP
80.0000 mg | Freq: Once | INTRAMUSCULAR | Status: AC
  Administered 2019-12-07: 80 mg via INTRA_ARTICULAR

## 2019-12-07 NOTE — Patient Instructions (Addendum)
Try VOLTAREN GEL 4 grams up to 4 times per day to your knees.    Please be extra careful with what you're eating the next week after getting your knee injection today.

## 2019-12-07 NOTE — Progress Notes (Signed)
Location:  [facility] clinic Provider: Pranav Lince L. Mariea Clonts, D.O., C.M.D.  Code Status: DNR Goals of Care:  Advanced Directives 12/07/2019  Does Patient Have a Medical Advance Directive? Yes  Type of Advance Directive [place]  Does patient want to make changes to medical advance directive? No - Patient declined  Copy of [place] in Chart? Yes - validated most recent copy scanned in chart (See row information)  Would patient like information on creating a medical advance directive? -  Pre-existing out of facility DNR order (yellow form or pink MOST form) -     Chief Complaint  Patient presents with  . Medical Management of Chronic Issues    6 week follow up on blood sugars   . Acute Visit    Severe pain in left knee     HPI: Patient is a 86 y.o. male seen today for f/u on blood sugars after elevated from steroid injection and a trip to the beach where he did not follow a diabetic diet.  We adjusted his mealtime insulin and he's f/u on this.  CBGs [id number] recently.  7 day avg was 215, 217 over 14d, 208 over 30d.    He mentions his severe pain in his left knee as well.  He has not had a shot it in a good while.  It got worse 2 wks ago afer riding his mower.  Says only pushing the peddle.  No other injury.  It throbs and getting up and down is worse than ever.  Taking 5 tylenol per day.  Cannot take asa-related pain kills, takes his gabapentin for bursitis and nerve pain left hand.  Used to feel like his arm was in boiling water.  Does use aspercreme and tolerates it.  He has not tried voltaren gel yet all this time.  Right anterior lower shin with large abrasion.  Left lateral leg with another.    Past Medical History:  Diagnosis Date  . Allergic rhinitis, cause unspecified   . Carpal tunnel syndrome   . Cervical spondylosis without myelopathy   . Cervical spondylosis without myelopathy   . Cervicalgia   . Diverticulosis of colon (without mention of  hemorrhage)   . Esophageal reflux   . Hypertrophy of prostate with urinary obstruction and other lower urinary tract symptoms (LUTS)   . Insomnia, unspecified   . Internal hemorrhoids without mention of complication   . Irritable bowel syndrome   . Macular degeneration (senile) of retina, unspecified   . Malignant neoplasm of prostate ([place])   . Neurogenic bladder, NOS   . Neuropathy   . Nonspecific (abnormal) findings on radiological and other examination of gastrointestinal tract   . Orthostatic hypotension   . Osteoarthrosis, unspecified whether generalized or localized, unspecified site   . Other and unspecified hyperlipidemia   . Other malaise and fatigue   . Other specified disorder of rectum and anus    Radiation Proctitus  . Paroxysmal supraventricular tachycardia ([place])   . Restless legs syndrome (RLS)   . Retinal detachment with retinal defect, unspecified   . Right bundle branch block    Incomplete  . Spermatocele    Right  . Trigger finger (acquired)   . Type II or unspecified type diabetes mellitus without mention of complication, not stated as uncontrolled   . Type II or unspecified type diabetes mellitus without mention of complication, uncontrolled   . Unspecified essential hypertension    patient denies, states he has a "  fast heart rate, but no high BP"  . Vitamin D deficiency     Past Surgical History:  Procedure Laterality Date  . Angiolipoma  1980   Right arm  . APPENDECTOMY  1951  . CHOLECYSTECTOMY  01/1994  . COLONOSCOPY  04/11/2010  . COLONOSCOPY W/ POLYPECTOMY  2005   Removed 2 (two) polyps  . DUPUYTREN CONTRACTURE RELEASE Bilateral 1989  . EYE SURGERY  2006   Left  . EYE SURGERY  2006   Retina reattachment, right  . EYE SURGERY  02/1994   Cataract surgery, right  . EYE SURGERY  01/1995   Cataract surgery, left  . KNEE SURGERY  1979  . PARATHYROIDECTOMY Left 12/27/2016   Procedure: LEFT INFERIOR PARATHYROIDECTOMY;  Surgeon: Armandina Gemma, MD;   Location: [facility];  Service: General;  Laterality: Left;  . POLYPECTOMY  1955   Vocal cord  . RETINAL DETACHMENT SURGERY Right 2005  . SHOULDER ARTHROSCOPY Right 03/22/15   [facility]  . TENDON RELEASE  01/1997   seven fingers  . TRANSURETHRAL RESECTION OF PROSTATE  08/1989  . VITRECTOMY Left 04/23/2013   [facility]    Allergies  Allergen Reactions  . Aspirin Other (See Comments)    Bothers stomach, thins blood   . Atorvastatin Other (See Comments)    Muscular pain and weakness  . Prednisone Other (See Comments)    Upset stomach, can take by shot not by mouth  . Simvastatin Other (See Comments)    Muscular pain and weakness  . Sulfa Antibiotics Other (See Comments)    Upset stomach   . Latex Rash  . Metformin Hcl Nausea Only  . Voltaren [Diclofenac Sodium] Other (See Comments)    Unknown    Outpatient Encounter Medications as of 12/07/2019  Medication Sig  . acetaminophen (TYLENOL) 500 MG tablet Take 1,000 mg by mouth 3 (three) times daily.  . cholecalciferol (VITAMIN D) 400 units TABS tablet Take 400 Units by mouth daily.  Marland Kitchen docusate sodium (COLACE) 100 MG capsule Take 100 mg by mouth daily as needed.   . famotidine (PEPCID AC) 10 MG chewable tablet Chew 10 mg by mouth daily as needed for heartburn.  . furosemide (LASIX) 20 MG tablet Take 1 tablet (20 mg total) by mouth daily. For 3 days, then stop  . gabapentin (NEURONTIN) 300 MG capsule TAKE 1 CAPSULE BY MOUTH 3  TIMES DAILY  . insulin glargine (LANTUS SOLOSTAR) 100 UNIT/ML Solostar Pen INJECT SUBCUTANEOUSLY 50 UNITS IN THE MORNING  . insulin lispro (HUMALOG KWIKPEN) 200 UNIT/ML KwikPen Inject 10 Units into the skin 2 (two) times daily after a meal.  . Insulin Pen Needle (B-D ULTRAFINE III SHORT PEN) 31G X 8 MM MISC Use three times daily for insulin Injections. Dx:E11.51  . loperamide (IMODIUM) 1 MG/5ML solution Take 1 mg by mouth as needed for diarrhea or loose stools.  Marland Kitchen loratadine (CLARITIN) 10 MG tablet Take 10 mg by  mouth daily as needed.   . metoprolol tartrate (LOPRESSOR) 25 MG tablet TAKE ONE-HALF TABLET BY  MOUTH TWICE DAILY  . mupirocin ointment (BACTROBAN) 2 % One application to affected area on right leg.  . Probiotic Product (PROBIOTIC-10) CHEW Chew 1 each by mouth daily.   . tamsulosin (FLOMAX) 0.4 MG CAPS capsule TAKE 1 CAPSULE BY MOUTH  DAILY FOR PROSTATE  . traMADol (ULTRAM) 50 MG tablet Take 1 tablet (50 mg total) by mouth every 6 (six) hours as needed for moderate pain.  Marland Kitchen trolamine salicylate (ASPERCREME) 10 %  cream Apply 1 application topically as needed for muscle pain.  . [DISCONTINUED] alum & mag hydroxide-simeth (MAALOX/MYLANTA) 200-200-20 MG/5ML suspension Take 30 mLs by mouth as needed for indigestion or heartburn.   No facility-administered encounter medications on file as of 12/07/2019.    Review of Systems:  Review of Systems  Constitutional: Negative for chills and fever.  HENT: Negative for congestion and sore throat.   Eyes: Positive for blurred vision.  Respiratory: Negative for shortness of breath.   Cardiovascular: Negative for chest pain, palpitations and leg swelling.  Gastrointestinal: Negative for abdominal pain and constipation.  Genitourinary: Negative for dysuria.       Some odor of incontinence of urine today  Musculoskeletal: Positive for joint pain. Negative for falls.  Skin:       Multiple bruises and skin tears  Neurological: Negative for loss of consciousness.       Numbness of left arm improved, no longer burns  Endo/Heme/Allergies: Bruises/bleeds easily.  Psychiatric/Behavioral: Negative for depression and memory loss. The patient is not nervous/anxious.     Health Maintenance  Topic Date Due  . HEMOGLOBIN A1C  03/04/2020  . OPHTHALMOLOGY EXAM  03/22/2020  . FOOT EXAM  05/03/2020  . URINE MICROALBUMIN  10/25/2020  . TETANUS/TDAP  06/28/2028  . INFLUENZA VACCINE  Completed  . COVID-19 Vaccine  Completed  . PNA vac Low Risk Adult  Completed     Physical Exam: Vitals:   12/07/19 1119  BP: 122/62  Pulse: 94  Temp: 97.8 F (36.6 C)  SpO2: 98%  Weight: 152 lb (68.9 kg)  Height: 5\' 7"  (1.702 m)   Body mass index is 23.81 kg/m. Physical Exam Vitals reviewed.  Constitutional:      General: He is not in acute distress.    Appearance: Normal appearance.     Comments: Increasingly frail and less mobile, uses cane  HENT:     Head: Normocephalic and atraumatic.  Cardiovascular:     Rate and Rhythm: Normal rate and regular rhythm.  Pulmonary:     Effort: Pulmonary effort is normal.     Breath sounds: Normal breath sounds.  Musculoskeletal:     Right lower leg: No edema.     Left lower leg: No edema.     Comments: Took all he had to get up out of chair using cane today; tenderness of left medial knee (since mowing lawn and pushing down on peddle)  Skin:    Comments: Right and left lower leg skin tears with eschars forming, bandage on left; no significant erythema, warmth or drainage  Neurological:     General: No focal deficit present.     Mental Status: He is alert and oriented to person, place, and time.  Psychiatric:        Mood and Affect: Mood normal.        Behavior: Behavior normal.     Labs reviewed: Basic Metabolic Panel: Recent Labs    12/24/18 0906 05/04/19 1153 09/02/19 0928  NA 141 140 140  K 4.2 4.2 4.0  CL 107 105 105  CO2 26 26 26   GLUCOSE 147* 153* 185*  BUN 15 13 15   CREATININE 1.02 0.90 0.86  CALCIUM 9.6 10.0 9.7   Liver Function Tests: Recent Labs    12/24/18 0906  AST 13  ALT 14  BILITOT 0.5  PROT 6.1   No results for input(s): LIPASE, AMYLASE in the last 8760 hours. No results for input(s): AMMONIA in the last 8760 hours. CBC:  Recent Labs    12/24/18 0906 05/04/19 1153 09/02/19 0928  WBC 6.2 5.3 7.7  NEUTROABS 4,303 3,503 5,344  HGB 13.9 14.5 14.7  HCT 40.8 41.7 42.9  MCV 89.3 89.7 89.9  PLT 200 208 208   Lipid Panel: No results for input(s): CHOL, HDL, LDLCALC,  TRIG, CHOLHDL, LDLDIRECT in the last 8760 hours. Lab Results  Component Value Date   HGBA1C 10.9 (H) 09/02/2019    Procedures since last visit: No results found.  Assessment/Plan 1. DM (diabetes mellitus), type 2 with peripheral vascular complications (HCC) -control improved from last time; however, he is now in significant knee pain interfering with his ability to transfer and function at home and quality of life affected -counseled on eating habits   2. Senile purpura ([place]) -remains considerable  3. Localized osteoarthritis of left knee -cleansed left medial knee with betadine x 2, sprayed with bupivicaine, injected with 80mg  depomedrol and 2cc 1% lidocaine with immediate relief - methylPREDNISolone acetate (DEPO-MEDROL) injection 80 mg--full dose given due to severity of pain at this time -counseled to be extra careful with his food choices considering his CBGs are already higher than I'd like but he's brittle and prone to hypoglycemia if I increase his insulin -not a good surgical candidate  Labs/tests ordered:  No new added today Next appt:  01/04/2020 with labs before for routine checkup  Anela Bensman L. Rachelle Edwards, D.O. [facility] [address], [place] [address] Cell Phone (Mon-Fri 8am-5pm):  [phone] On Call:  [phone] & follow prompts after 5pm & weekends Office Phone:  [phone] Office Fax:  [phone]

## 2019-12-29 DIAGNOSIS — C61 Malignant neoplasm of prostate: Secondary | ICD-10-CM | POA: Diagnosis not present

## 2019-12-31 ENCOUNTER — Other Ambulatory Visit: Payer: Medicare Other

## 2019-12-31 ENCOUNTER — Other Ambulatory Visit: Payer: Self-pay

## 2019-12-31 DIAGNOSIS — E1151 Type 2 diabetes mellitus with diabetic peripheral angiopathy without gangrene: Secondary | ICD-10-CM

## 2020-01-01 ENCOUNTER — Encounter: Payer: Self-pay | Admitting: Family

## 2020-01-01 LAB — COMPLETE METABOLIC PANEL WITH GFR
AG Ratio: 2.4 (calc) (ref 1.0–2.5)
ALT: 11 U/L (ref 9–46)
AST: 11 U/L (ref 10–35)
Albumin: 4.5 g/dL (ref 3.6–5.1)
Alkaline phosphatase (APISO): 75 U/L (ref 35–144)
BUN: 14 mg/dL (ref 7–25)
CO2: 27 mmol/L (ref 20–32)
Calcium: 9.8 mg/dL (ref 8.6–10.3)
Chloride: 109 mmol/L (ref 98–110)
Creat: 0.89 mg/dL (ref 0.70–1.11)
GFR, Est African American: 88 mL/min/{1.73_m2} (ref >=60)
GFR, Est Non African American: 76 mL/min/{1.73_m2} (ref >=60)
Globulin: 1.9 g/dL (calc) (ref 1.9–3.7)
Glucose, Bld: 32 mg/dL — CL (ref 65–99)
Potassium: 4 mmol/L (ref 3.5–5.3)
Sodium: 144 mmol/L (ref 135–146)
Total Bilirubin: 0.6 mg/dL (ref 0.2–1.2)
Total Protein: 6.4 g/dL (ref 6.1–8.1)

## 2020-01-01 LAB — HEMOGLOBIN A1C
Hgb A1c MFr Bld: 7.2 % of total Hgb — ABNORMAL HIGH (ref <5.7)
Mean Plasma Glucose: 160 mg/dL
eAG (mmol/L): 8.9 mmol/L

## 2020-01-01 NOTE — Progress Notes (Signed)
Sugar average is down to 7.2; however, I'm concerned that his blood sugar when he got his blood taken was 32!  If he's having many lows, we will need to make changes again to his regimen when he comes Monday.  I also have lantus insulin pens and pen needles for him in the fridge.

## 2020-01-04 ENCOUNTER — Encounter: Payer: Self-pay | Admitting: Internal Medicine

## 2020-01-04 ENCOUNTER — Other Ambulatory Visit: Payer: Self-pay

## 2020-01-04 ENCOUNTER — Ambulatory Visit (INDEPENDENT_AMBULATORY_CARE_PROVIDER_SITE_OTHER): Payer: Medicare Other | Admitting: Internal Medicine

## 2020-01-04 VITALS — BP 110/70 | HR 84 | Temp 97.5°F | Ht 67.0 in | Wt 156.0 lb

## 2020-01-04 DIAGNOSIS — E162 Hypoglycemia, unspecified: Secondary | ICD-10-CM

## 2020-01-04 DIAGNOSIS — M1712 Unilateral primary osteoarthritis, left knee: Secondary | ICD-10-CM

## 2020-01-04 DIAGNOSIS — E1151 Type 2 diabetes mellitus with diabetic peripheral angiopathy without gangrene: Secondary | ICD-10-CM

## 2020-01-04 MED ORDER — LANTUS SOLOSTAR 100 UNIT/ML ~~LOC~~ SOPN: PEN_INJECTOR | SUBCUTANEOUS | 3 refills | Status: DC

## 2020-01-04 NOTE — Progress Notes (Signed)
Location:  [facility] clinic Provider:  Addylynn Balin L. Mariea Clonts, D.O., C.M.D.  Code Status: DNR Goals of Care:  Advanced Directives 01/04/2020  Does Patient Have a Medical Advance Directive? Yes  Type of Paramedic of [place];Living will;Out of facility DNR (pink MOST or yellow form)  Does patient want to make changes to medical advance directive? No - Patient declined  Copy of [place] in Chart? Yes - validated most recent copy scanned in chart (See row information)  Would patient like information on creating a medical advance directive? -  Pre-existing out of facility DNR order (yellow form or pink MOST form) Yellow form placed in chart (order not valid for inpatient use)     Chief Complaint  Patient presents with  . Medical Management of Chronic Issues    4 month follow up.Discuss labs today.    HPI: Patient is a 86 y.o. male seen today for medical management of chronic diseases.    hba1c went down to 7.2 from 10.9.   The shot I gave him on the left knee (steroid) did not help.  Right has been bone on bone for years.  Left hurts worse than right did now.  He was originally going to get the right knee replaced but hba1c was over 8.    hba1c now down to 7.2 from 10.9 in august despite steroid injections in his knees and some of the foods he reports eating.   Past Medical History:  Diagnosis Date  . Allergic rhinitis, cause unspecified   . Carpal tunnel syndrome   . Cervical spondylosis without myelopathy   . Cervical spondylosis without myelopathy   . Cervicalgia   . Diverticulosis of colon (without mention of hemorrhage)   . Esophageal reflux   . Hypertrophy of prostate with urinary obstruction and other lower urinary tract symptoms (LUTS)   . Insomnia, unspecified   . Internal hemorrhoids without mention of complication   . Irritable bowel syndrome   . Macular degeneration (senile) of retina, unspecified   . Malignant neoplasm of  prostate ([place])   . Neurogenic bladder, NOS   . Neuropathy   . Nonspecific (abnormal) findings on radiological and other examination of gastrointestinal tract   . Orthostatic hypotension   . Osteoarthrosis, unspecified whether generalized or localized, unspecified site   . Other and unspecified hyperlipidemia   . Other malaise and fatigue   . Other specified disorder of rectum and anus    Radiation Proctitus  . Paroxysmal supraventricular tachycardia ([place])   . Restless legs syndrome (RLS)   . Retinal detachment with retinal defect, unspecified   . Right bundle branch block    Incomplete  . Spermatocele    Right  . Trigger finger (acquired)   . Type II or unspecified type diabetes mellitus without mention of complication, not stated as uncontrolled   . Type II or unspecified type diabetes mellitus without mention of complication, uncontrolled   . Unspecified essential hypertension    patient denies, states he has a "fast heart rate, but no high BP"  . Vitamin D deficiency     Past Surgical History:  Procedure Laterality Date  . Angiolipoma  1980   Right arm  . APPENDECTOMY  1951  . CHOLECYSTECTOMY  01/1994  . COLONOSCOPY  04/11/2010  . COLONOSCOPY W/ POLYPECTOMY  2005   Removed 2 (two) polyps  . DUPUYTREN CONTRACTURE RELEASE Bilateral 1989  . EYE SURGERY  2006   Left  . EYE SURGERY  2006   Retina reattachment, right  . EYE SURGERY  02/1994   Cataract surgery, right  . EYE SURGERY  01/1995   Cataract surgery, left  . KNEE SURGERY  1979  . PARATHYROIDECTOMY Left 12/27/2016   Procedure: LEFT INFERIOR PARATHYROIDECTOMY;  Surgeon: Armandina Gemma, MD;  Location: [facility];  Service: General;  Laterality: Left;  . POLYPECTOMY  1955   Vocal cord  . RETINAL DETACHMENT SURGERY Right 2005  . SHOULDER ARTHROSCOPY Right 03/22/15   [facility]  . TENDON RELEASE  01/1997   seven fingers  . TRANSURETHRAL RESECTION OF PROSTATE  08/1989  . VITRECTOMY Left 04/23/2013   [facility]     Allergies  Allergen Reactions  . Aspirin Other (See Comments)    Bothers stomach, thins blood   . Atorvastatin Other (See Comments)    Muscular pain and weakness  . Prednisone Other (See Comments)    Upset stomach, can take by shot not by mouth  . Simvastatin Other (See Comments)    Muscular pain and weakness  . Sulfa Antibiotics Other (See Comments)    Upset stomach   . Other Other (See Comments)  . Latex Rash  . Metformin Hcl Nausea Only  . Voltaren [Diclofenac Sodium] Other (See Comments)    Unknown    Outpatient Encounter Medications as of 01/04/2020  Medication Sig  . acetaminophen (TYLENOL) 500 MG tablet Take 1,000 mg by mouth 3 (three) times daily.  . cholecalciferol (VITAMIN D) 400 units TABS tablet Take 400 Units by mouth daily.  Marland Kitchen docusate sodium (COLACE) 100 MG capsule Take 100 mg by mouth daily as needed.   . famotidine (PEPCID AC) 10 MG chewable tablet Chew 10 mg by mouth daily as needed for heartburn.  . gabapentin (NEURONTIN) 300 MG capsule TAKE 1 CAPSULE BY MOUTH 3  TIMES DAILY  . insulin glargine (LANTUS SOLOSTAR) 100 UNIT/ML Solostar Pen INJECT SUBCUTANEOUSLY 50 UNITS IN THE MORNING  . insulin lispro (HUMALOG KWIKPEN) 200 UNIT/ML KwikPen Inject 10 Units into the skin 2 (two) times daily after a meal.  . Insulin Pen Needle (B-D ULTRAFINE III SHORT PEN) 31G X 8 MM MISC Use three times daily for insulin Injections. Dx:E11.51  . loperamide (IMODIUM) 1 MG/5ML solution Take 1 mg by mouth as needed for diarrhea or loose stools.  Marland Kitchen loratadine (CLARITIN) 10 MG tablet Take 10 mg by mouth daily as needed.   . metoprolol tartrate (LOPRESSOR) 25 MG tablet TAKE ONE-HALF TABLET BY  MOUTH TWICE DAILY  . mupirocin ointment (BACTROBAN) 2 % One application to affected area on right leg.  . Probiotic Product (PROBIOTIC-10) CHEW Chew 1 each by mouth daily.   . tamsulosin (FLOMAX) 0.4 MG CAPS capsule TAKE 1 CAPSULE BY MOUTH  DAILY FOR PROSTATE  . trolamine salicylate (ASPERCREME)  10 % cream Apply 1 application topically as needed for muscle pain.  . [DISCONTINUED] furosemide (LASIX) 20 MG tablet Take 1 tablet (20 mg total) by mouth daily. For 3 days, then stop  . [DISCONTINUED] traMADol (ULTRAM) 50 MG tablet Take 1 tablet (50 mg total) by mouth every 6 (six) hours as needed for moderate pain.   No facility-administered encounter medications on file as of 01/04/2020.    Review of Systems:  Review of Systems  Constitutional: Negative for chills and fever.  HENT: Positive for hearing loss. Negative for congestion and sore throat.   Eyes: Positive for blurred vision.  Respiratory: Negative for cough and shortness of breath.   Cardiovascular: Negative for chest pain,  palpitations and leg swelling.  Gastrointestinal: Negative for abdominal pain and constipation.  Genitourinary: Negative for dysuria.  Musculoskeletal: Positive for joint pain. Negative for falls.  Neurological: Positive for tingling and sensory change. Negative for dizziness and loss of consciousness.  Endo/Heme/Allergies: Bruises/bleeds easily.  Psychiatric/Behavioral: Negative for depression and memory loss. The patient is not nervous/anxious and does not have insomnia.        Repeats stories, but seems to remember directions, etc so seems to be just because he likes to tell the stories    Health Maintenance  Topic Date Due  . OPHTHALMOLOGY EXAM  03/22/2020  . COVID-19 Vaccine (4 - Booster for Pfizer series) 04/27/2020  . FOOT EXAM  05/03/2020  . HEMOGLOBIN A1C  06/30/2020  . URINE MICROALBUMIN  10/25/2020  . TETANUS/TDAP  06/28/2028  . INFLUENZA VACCINE  Completed  . PNA vac Low Risk Adult  Completed    Physical Exam: Vitals:   01/04/20 1054  BP: 110/70  Pulse: 84  Temp: (!) 97.5 F (36.4 C)  SpO2: 98%  Weight: 156 lb (70.8 kg)  Height: 5\' 7"  (1.702 m)   Body mass index is 24.43 kg/m. Physical Exam Vitals reviewed.  Constitutional:      General: He is not in acute distress.     Appearance: Normal appearance. He is normal weight. He is not ill-appearing or toxic-appearing.  HENT:     Head: Normocephalic and atraumatic.  Eyes:     Conjunctiva/sclera: Conjunctivae normal.     Pupils: Pupils are equal, round, and reactive to light.  Cardiovascular:     Rate and Rhythm: Normal rate and regular rhythm.     Pulses: Normal pulses.     Heart sounds: Normal heart sounds.  Pulmonary:     Effort: Pulmonary effort is normal.     Breath sounds: Normal breath sounds. No wheezing, rhonchi or rales.  Abdominal:     General: Bowel sounds are normal.     Tenderness: There is no abdominal tenderness. There is no guarding or rebound.  Neurological:     General: No focal deficit present.     Mental Status: He is alert and oriented to person, place, and time.     Gait: Gait abnormal.     Comments: Difficulty getting up out of chair, uses cane only  Psychiatric:        Mood and Affect: Mood normal.        Behavior: Behavior normal.     Labs reviewed: Basic Metabolic Panel: Recent Labs    05/04/19 1153 09/02/19 0928 12/31/19 0900  NA 140 140 144  K 4.2 4.0 4.0  CL 105 105 109  CO2 26 26 27   GLUCOSE 153* 185* 32*  BUN 13 15 14   CREATININE 0.90 0.86 0.89  CALCIUM 10.0 9.7 9.8   Liver Function Tests: Recent Labs    12/31/19 0900  AST 11  ALT 11  BILITOT 0.6  PROT 6.4   No results for input(s): LIPASE, AMYLASE in the last 8760 hours. No results for input(s): AMMONIA in the last 8760 hours. CBC: Recent Labs    05/04/19 1153 09/02/19 0928  WBC 5.3 7.7  NEUTROABS 3,503 5,344  HGB 14.5 14.7  HCT 41.7 42.9  MCV 89.7 89.9  PLT 208 208   Lipid Panel: No results for input(s): CHOL, HDL, LDLCALC, TRIG, CHOLHDL, LDLDIRECT in the last 8760 hours. Lab Results  Component Value Date   HGBA1C 7.2 (H) 12/31/2019    Assessment/Plan 1. DM (diabetes  mellitus), type 2 with peripheral vascular complications ([facility]) - control much improved, but I'm concerned that he  reports occasional lows including significant low when he had his labs done last week (odd b/c he was completely asymptomatic when I saw him in the hall before he got his blood drawn) so we will decrease his lantus to 46 units from 50 units, cont same short-acting 10 units bid  - Hemoglobin A1c; Future - COMPLETE METABOLIC PANEL WITH GFR; Future  2. Localized osteoarthritis of left knee -depomedrol 80mg  injection I gave him only gave him a couple of days relief and it had been successful in location so suspect bone on bone on left, too -advised ortho f/u for possible cartilage injection instead -do not recommend TKA in this gentleman due to challenges I foresee with rehab -if not getting relief with injections of any kind, may need to move to low dose opioid for relief   3. Hypoglycemia - noted as above, reduce insulin by 4 units - Hemoglobin A1c; Future - COMPLETE METABOLIC PANEL WITH GFR; Future   Labs/tests ordered:   Orders Placed This Encounter  Procedures  . Hemoglobin A1c    Standing Status:   Future    Standing Expiration Date:   09/03/2020    Order Specific Question:   Release to patient    Answer:   Immediate  . COMPLETE METABOLIC PANEL WITH GFR    Standing Status:   Future    Standing Expiration Date:   09/03/2020    Next appt:  05/05/2020 with fasting labs before   Lien Lyman L. Massa Pe, D.O. [facility] [address], [place] [address] Cell Phone (Mon-Fri 8am-5pm):  [phone] On Call:  [phone] & follow prompts after 5pm & weekends Office Phone:  [phone] Office Fax:  [phone]

## 2020-01-04 NOTE — Patient Instructions (Signed)
Decrease your lantus insulin to 46 units from 50 units daily. Keep your fast-acting insulin at 10 units twice a day after meals.

## 2020-01-05 DIAGNOSIS — R351 Nocturia: Secondary | ICD-10-CM | POA: Diagnosis not present

## 2020-01-05 DIAGNOSIS — Z8546 Personal history of malignant neoplasm of prostate: Secondary | ICD-10-CM | POA: Diagnosis not present

## 2020-01-06 ENCOUNTER — Other Ambulatory Visit: Payer: Self-pay | Admitting: Internal Medicine

## 2020-01-06 NOTE — Telephone Encounter (Signed)
Patient needs refill on medication "Flomax". Patient last refill was 11/24/2018. Patient is due for refill. I tried to send medication in but warning came up. Medication sent to provider. Please Advise.

## 2020-01-14 NOTE — Telephone Encounter (Signed)
Error

## 2020-02-12 ENCOUNTER — Other Ambulatory Visit: Payer: Self-pay | Admitting: Internal Medicine

## 2020-02-12 DIAGNOSIS — E1151 Type 2 diabetes mellitus with diabetic peripheral angiopathy without gangrene: Secondary | ICD-10-CM

## 2020-03-14 ENCOUNTER — Encounter: Payer: Self-pay | Admitting: Internal Medicine

## 2020-04-26 ENCOUNTER — Other Ambulatory Visit: Payer: Self-pay | Admitting: Family Medicine

## 2020-04-26 DIAGNOSIS — E1151 Type 2 diabetes mellitus with diabetic peripheral angiopathy without gangrene: Secondary | ICD-10-CM

## 2020-04-28 DIAGNOSIS — M25512 Pain in left shoulder: Secondary | ICD-10-CM | POA: Diagnosis not present

## 2020-05-02 ENCOUNTER — Other Ambulatory Visit: Payer: Medicare Other

## 2020-05-02 ENCOUNTER — Other Ambulatory Visit: Payer: Self-pay

## 2020-05-02 DIAGNOSIS — E1151 Type 2 diabetes mellitus with diabetic peripheral angiopathy without gangrene: Secondary | ICD-10-CM

## 2020-05-03 LAB — BASIC METABOLIC PANEL
BUN: 20 mg/dL (ref 7–25)
CO2: 27 mmol/L (ref 20–32)
Calcium: 9.3 mg/dL (ref 8.6–10.3)
Chloride: 107 mmol/L (ref 98–110)
Creat: 0.96 mg/dL (ref 0.70–1.11)
Glucose, Bld: 116 mg/dL — ABNORMAL HIGH (ref 65–99)
Potassium: 4 mmol/L (ref 3.5–5.3)
Sodium: 142 mmol/L (ref 135–146)

## 2020-05-03 LAB — HEMOGLOBIN A1C
Hgb A1c MFr Bld: 7.8 % of total Hgb — ABNORMAL HIGH (ref <5.7)
Mean Plasma Glucose: 177 mg/dL
eAG (mmol/L): 9.8 mmol/L

## 2020-05-05 ENCOUNTER — Other Ambulatory Visit: Payer: Medicare Other

## 2020-05-05 ENCOUNTER — Ambulatory Visit: Payer: Medicare Other | Admitting: Internal Medicine

## 2020-05-11 DIAGNOSIS — M25561 Pain in right knee: Secondary | ICD-10-CM | POA: Diagnosis not present

## 2020-05-11 DIAGNOSIS — M25562 Pain in left knee: Secondary | ICD-10-CM | POA: Diagnosis not present

## 2020-05-18 ENCOUNTER — Ambulatory Visit (INDEPENDENT_AMBULATORY_CARE_PROVIDER_SITE_OTHER): Payer: Medicare Other | Admitting: Family Medicine

## 2020-05-18 ENCOUNTER — Other Ambulatory Visit: Payer: Self-pay

## 2020-05-18 ENCOUNTER — Encounter: Payer: Self-pay | Admitting: Family Medicine

## 2020-05-18 VITALS — BP 128/78 | HR 85 | Temp 97.6°F | Resp 16 | Ht 67.0 in | Wt 157.8 lb

## 2020-05-18 DIAGNOSIS — E1151 Type 2 diabetes mellitus with diabetic peripheral angiopathy without gangrene: Secondary | ICD-10-CM

## 2020-05-18 DIAGNOSIS — I10 Essential (primary) hypertension: Secondary | ICD-10-CM | POA: Diagnosis not present

## 2020-05-18 DIAGNOSIS — G5602 Carpal tunnel syndrome, left upper limb: Secondary | ICD-10-CM | POA: Diagnosis not present

## 2020-05-18 NOTE — Progress Notes (Signed)
Provider:  Alain Honey, MD  Careteam: Patient Care Team: [name], MD as PCP - General (Family Medicine) Netta Cedars, MD as Consulting Physician (Orthopedic Surgery) Jasmine Awe, MD as Referring Physician (Ophthalmology) Rana Snare, MD as Consulting Physician (Urology)  PLACE OF SERVICE:  [place] Directive information Does Patient Have a Medical Advance Directive?: Yes, Type of Advance Directive: Living will, Does patient want to make changes to medical advance directive?: No - Patient declined  Allergies  Allergen Reactions  . Aspirin Other (See Comments)    Bothers stomach, thins blood   . Atorvastatin Other (See Comments)    Muscular pain and weakness  . Prednisone Other (See Comments)    Upset stomach, can take by shot not by mouth  . Simvastatin Other (See Comments)    Muscular pain and weakness  . Sulfa Antibiotics Other (See Comments)    Upset stomach   . Other Other (See Comments)  . Latex Rash  . Metformin Hcl Nausea Only  . Voltaren [Diclofenac Sodium] Other (See Comments)    Unknown    Chief Complaint  Patient presents with  . Medical Management of Chronic Issues    4 month follow up  . Health Maintenance    Discuss the need for Eye exam, and Foot exam.  . Immunizations    Discuss the need for 2nd Covid Booster.     HPI: Patient is a 87 y.o. male routine follow-up.  Lives with his wife of 64 years.  He is a diabetic and has some secondary neuropathies.  He uses a cane for ambulation. Complains today of some shortness of breath but also lots of seasonal allergies.  He denies chest pain or dependent edema. Still mows his yard and works outside.  Still walks about a mile several days of the week but stops to rest on a neighbor's porch during the walk.  Review of Systems:  Review of Systems  Constitutional: Negative.   HENT: Negative.   Eyes: Positive for blurred vision.       Macular degeneration   Respiratory: Positive for shortness of breath and wheezing.   Cardiovascular: Negative.   Genitourinary: Positive for frequency.  Musculoskeletal: Positive for joint pain.       Receives injections per orthopedics  Neurological: Negative.   Psychiatric/Behavioral: Negative.   All other systems reviewed and are negative.   Past Medical History:  Diagnosis Date  . Allergic rhinitis, cause unspecified   . Carpal tunnel syndrome   . Cervical spondylosis without myelopathy   . Cervical spondylosis without myelopathy   . Cervicalgia   . Diverticulosis of colon (without mention of hemorrhage)   . Esophageal reflux   . Hypertrophy of prostate with urinary obstruction and other lower urinary tract symptoms (LUTS)   . Insomnia, unspecified   . Internal hemorrhoids without mention of complication   . Irritable bowel syndrome   . Macular degeneration (senile) of retina, unspecified   . Malignant neoplasm of prostate ([place])   . Neurogenic bladder, NOS   . Neuropathy   . Nonspecific (abnormal) findings on radiological and other examination of gastrointestinal tract   . Orthostatic hypotension   . Osteoarthrosis, unspecified whether generalized or localized, unspecified site   . Other and unspecified hyperlipidemia   . Other malaise and fatigue   . Other specified disorder of rectum and anus    Radiation Proctitus  . Paroxysmal supraventricular tachycardia ([place])   . Restless legs syndrome (RLS)   .  Retinal detachment with retinal defect, unspecified   . Right bundle branch block    Incomplete  . Spermatocele    Right  . Trigger finger (acquired)   . Type II or unspecified type diabetes mellitus without mention of complication, not stated as uncontrolled   . Type II or unspecified type diabetes mellitus without mention of complication, uncontrolled   . Unspecified essential hypertension    patient denies, states he has a "fast heart rate, but no high BP"  . Vitamin D deficiency     Past Surgical History:  Procedure Laterality Date  . Angiolipoma  1980   Right arm  . APPENDECTOMY  1951  . CHOLECYSTECTOMY  01/1994  . COLONOSCOPY  04/11/2010  . COLONOSCOPY W/ POLYPECTOMY  2005   Removed 2 (two) polyps  . DUPUYTREN CONTRACTURE RELEASE Bilateral 1989  . EYE SURGERY  2006   Left  . EYE SURGERY  2006   Retina reattachment, right  . EYE SURGERY  02/1994   Cataract surgery, right  . EYE SURGERY  01/1995   Cataract surgery, left  . KNEE SURGERY  1979  . PARATHYROIDECTOMY Left 12/27/2016   Procedure: LEFT INFERIOR PARATHYROIDECTOMY;  Surgeon: Armandina Gemma, MD;  Location: [facility];  Service: General;  Laterality: Left;  . POLYPECTOMY  1955   Vocal cord  . RETINAL DETACHMENT SURGERY Right 2005  . SHOULDER ARTHROSCOPY Right 03/22/15   [facility]  . TENDON RELEASE  01/1997   seven fingers  . TRANSURETHRAL RESECTION OF PROSTATE  08/1989  . VITRECTOMY Left 04/23/2013   [facility]   Social History:   reports that he quit smoking about 71 years ago. His smoking use included cigarettes. He quit after 4.00 years of use. He has never used smokeless tobacco. He reports that he does not drink alcohol and does not use drugs.  Family History  Problem Relation Age of Onset  . Depression Mother   . Diabetes Mother   . Mental illness Mother        OCD  . Cerebral aneurysm Father        age 56  . Hypertension Sister   . Diabetes Daughter   . Hyperlipidemia Daughter   . Breast cancer Daughter   . Breast cancer Daughter   . Hyperparathyroidism Neg Hx     Medications: Patient's Medications  New Prescriptions   No medications on file  Previous Medications   ACETAMINOPHEN (TYLENOL) 500 MG TABLET    Take 1,000 mg by mouth 3 (three) times daily.   CHOLECALCIFEROL (VITAMIN D) 400 UNITS TABS TABLET    Take 400 Units by mouth daily.   DOCUSATE SODIUM (COLACE) 100 MG CAPSULE    Take 100 mg by mouth daily as needed.    FAMOTIDINE (PEPCID AC) 10 MG CHEWABLE TABLET    Chew 10 mg by  mouth daily as needed for heartburn.   GABAPENTIN (NEURONTIN) 300 MG CAPSULE    TAKE 1 CAPSULE BY MOUTH 3  TIMES DAILY   HUMALOG KWIKPEN 200 UNIT/ML KWIKPEN    INJECT SUBCUTANEOUSLY 6  UNITS TWICE DAILY AFTER  MEALS   INSULIN GLARGINE (LANTUS SOLOSTAR) 100 UNIT/ML SOLOSTAR PEN    INJECT SUBCUTANEOUSLY 46 UNITS IN THE MORNING   INSULIN PEN NEEDLE (B-D ULTRAFINE III SHORT PEN) 31G X 8 MM MISC    Use three times daily for insulin Injections. Dx:E11.51   LOPERAMIDE (IMODIUM) 1 MG/5ML SOLUTION    Take 1 mg by mouth as needed for diarrhea or loose stools.  LORATADINE (CLARITIN) 10 MG TABLET    Take 10 mg by mouth daily as needed.    METOPROLOL TARTRATE (LOPRESSOR) 25 MG TABLET    TAKE ONE-HALF TABLET BY  MOUTH TWICE DAILY   MUPIROCIN OINTMENT (BACTROBAN) 2 %    One application to affected area on right leg.   PROBIOTIC PRODUCT (PROBIOTIC-10) CHEW    Chew 1 each by mouth daily.    TAMSULOSIN (FLOMAX) 0.4 MG CAPS CAPSULE    TAKE 1 CAPSULE BY MOUTH  DAILY FOR PROSTATE   TROLAMINE SALICYLATE (ASPERCREME) 10 % CREAM    Apply 1 application topically as needed for muscle pain.  Modified Medications   No medications on file  Discontinued Medications   No medications on file    Physical Exam:  Vitals:   05/18/20 1025  BP: 128/78  Pulse: 85  Resp: 16  Temp: 97.6 F (36.4 C)  SpO2: 97%  Weight: 157 lb 12.8 oz (71.6 kg)  Height: 5\' 7"  (1.702 m)   Body mass index is 24.71 kg/m. Wt Readings from Last 3 Encounters:  05/18/20 157 lb 12.8 oz (71.6 kg)  01/04/20 156 lb (70.8 kg)  12/07/19 152 lb (68.9 kg)    Physical Exam Vitals and nursing note reviewed.  Constitutional:      Appearance: Normal appearance.  Cardiovascular:     Rate and Rhythm: Normal rate and regular rhythm.     Heart sounds: Normal heart sounds.  Pulmonary:     Effort: Pulmonary effort is normal.     Breath sounds: Wheezing present.  Abdominal:     General: Abdomen is flat. Bowel sounds are normal.     Palpations: There  is no mass.     Tenderness: There is no abdominal tenderness.  Musculoskeletal:        General: Normal range of motion.  Neurological:     General: No focal deficit present.     Mental Status: He is alert and oriented to person, place, and time.  Psychiatric:        Mood and Affect: Mood normal.        Thought Content: Thought content normal.        Judgment: Judgment normal.     Labs reviewed: Basic Metabolic Panel: Recent Labs    09/02/19 0928 12/31/19 0900 05/02/20 0817  NA 140 144 142  K 4.0 4.0 4.0  CL 105 109 107  CO2 26 27 27   GLUCOSE 185* 32* 116*  BUN 15 14 20   CREATININE 0.86 0.89 0.96  CALCIUM 9.7 9.8 9.3   Liver Function Tests: Recent Labs    12/31/19 0900  AST 11  ALT 11  BILITOT 0.6  PROT 6.4   No results for input(s): LIPASE, AMYLASE in the last 8760 hours. No results for input(s): AMMONIA in the last 8760 hours. CBC: Recent Labs    09/02/19 0928  WBC 7.7  NEUTROABS 5,344  HGB 14.7  HCT 42.9  MCV 89.9  PLT 208   Lipid Panel: No results for input(s): CHOL, HDL, LDLCALC, TRIG, CHOLHDL, LDLDIRECT in the last 8760 hours. TSH: No results for input(s): TSH in the last 8760 hours. A1C: Lab Results  Component Value Date   HGBA1C 7.8 (H) 05/02/2020     Assessment/Plan  1. Essential hypertension Blood pressure today is good at 128/78.  He takes metoprolol.  Need to explore why he is not on ACE or ARB given his diabetes  2. DM (diabetes mellitus), type 2 with peripheral vascular complications (  [place]) A1c from 2 weeks ago was 7.8.  I think at 87 years old we should not strive to get A1c at 7.  Talked about importance of carbs but also gave him permission to eat most foods.  Continue with Lantus and Humalog as before  3. Carpal tunnel syndrome of left wrist Tak of this med.  Es gabapentin per neurology.  Seems pleased with results   Alain Honey, MD [facility] [phone]

## 2020-05-25 DIAGNOSIS — S81812A Laceration without foreign body, left lower leg, initial encounter: Secondary | ICD-10-CM | POA: Diagnosis not present

## 2020-05-30 ENCOUNTER — Other Ambulatory Visit: Payer: Self-pay | Admitting: *Deleted

## 2020-05-30 MED ORDER — METOPROLOL TARTRATE 25 MG PO TABS: 12.5000 mg | ORAL_TABLET | Freq: Two times a day (BID) | ORAL | 3 refills | Status: DC

## 2020-05-30 NOTE — Telephone Encounter (Signed)
[facility] requested refill.  ? ?

## 2020-06-02 ENCOUNTER — Other Ambulatory Visit: Payer: Self-pay | Admitting: *Deleted

## 2020-06-02 DIAGNOSIS — E1151 Type 2 diabetes mellitus with diabetic peripheral angiopathy without gangrene: Secondary | ICD-10-CM

## 2020-06-02 MED ORDER — BD PEN NEEDLE SHORT U/F 31G X 8 MM MISC: 3 refills | Status: DC

## 2020-06-02 NOTE — Telephone Encounter (Signed)
Patient requested refill on Pen Needles.

## 2020-06-07 ENCOUNTER — Encounter: Payer: Self-pay | Admitting: Family Medicine

## 2020-06-07 ENCOUNTER — Other Ambulatory Visit: Payer: Self-pay

## 2020-06-07 ENCOUNTER — Ambulatory Visit (INDEPENDENT_AMBULATORY_CARE_PROVIDER_SITE_OTHER): Payer: Medicare Other | Admitting: Family Medicine

## 2020-06-07 VITALS — BP 126/64 | HR 90 | Temp 97.1°F | Resp 20 | Ht 67.0 in | Wt 155.2 lb

## 2020-06-07 DIAGNOSIS — E1151 Type 2 diabetes mellitus with diabetic peripheral angiopathy without gangrene: Secondary | ICD-10-CM | POA: Diagnosis not present

## 2020-06-07 DIAGNOSIS — M75101 Unspecified rotator cuff tear or rupture of right shoulder, not specified as traumatic: Secondary | ICD-10-CM

## 2020-06-07 DIAGNOSIS — G5622 Lesion of ulnar nerve, left upper limb: Secondary | ICD-10-CM

## 2020-06-07 DIAGNOSIS — I10 Essential (primary) hypertension: Secondary | ICD-10-CM | POA: Diagnosis not present

## 2020-06-07 DIAGNOSIS — R062 Wheezing: Secondary | ICD-10-CM | POA: Diagnosis not present

## 2020-06-07 MED ORDER — ALBUTEROL SULFATE HFA 108 (90 BASE) MCG/ACT IN AERS: 2.0000 | INHALATION_SPRAY | Freq: Four times a day (QID) | RESPIRATORY_TRACT | 0 refills | Status: DC | PRN

## 2020-06-07 NOTE — Progress Notes (Signed)
Provider:  Alain Honey, MD  Careteam: Patient Care Team: [name], MD as PCP - General (Family Medicine) Netta Cedars, MD as Consulting Physician (Orthopedic Surgery) Jasmine Awe, MD as Referring Physician (Ophthalmology) Rana Snare, MD (Inactive) as Consulting Physician (Urology)  PLACE OF SERVICE:  [facility]  Advanced Directive information    Allergies  Allergen Reactions  . Aspirin Other (See Comments)    Bothers stomach, thins blood   . Atorvastatin Other (See Comments)    Muscular pain and weakness  . Prednisone Other (See Comments)    Upset stomach, can take by shot not by mouth  . Simvastatin Other (See Comments)    Muscular pain and weakness  . Sulfa Antibiotics Other (See Comments)    Upset stomach   . Other Other (See Comments)  . Latex Rash  . Metformin Hcl Nausea Only  . Voltaren [Diclofenac Sodium] Other (See Comments)    Unknown    Chief Complaint  Patient presents with  . Referral    Patient would like a referral placed to the endocrinologist. He reports having SOB, hoarseness after moving around.  . Quality Metric Gaps    Patient is due for a eye exam     HPI: Patient is a 87 y.o. male patient requesting referral to an endocrinologist.  Had history of parathyroid disease and he thinks there may be a connection to his current symptoms of feeling tired and short of breath when he goes out.  He does endorse some wheezing but no cough. Also complains of left shoulder pain.  Has appointment with orthopedist tomorrow.  Review of Systems:  Review of Systems  Constitutional: Positive for malaise/fatigue.  Respiratory: Positive for shortness of breath.   All other systems reviewed and are negative.   Past Medical History:  Diagnosis Date  . Allergic rhinitis, cause unspecified   . Carpal tunnel syndrome   . Cervical spondylosis without myelopathy   . Cervical spondylosis without myelopathy   . Cervicalgia   .  Diverticulosis of colon (without mention of hemorrhage)   . Esophageal reflux   . Hypertrophy of prostate with urinary obstruction and other lower urinary tract symptoms (LUTS)   . Insomnia, unspecified   . Internal hemorrhoids without mention of complication   . Irritable bowel syndrome   . Macular degeneration (senile) of retina, unspecified   . Malignant neoplasm of prostate ([place])   . Neurogenic bladder, NOS   . Neuropathy   . Nonspecific (abnormal) findings on radiological and other examination of gastrointestinal tract   . Orthostatic hypotension   . Osteoarthrosis, unspecified whether generalized or localized, unspecified site   . Other and unspecified hyperlipidemia   . Other malaise and fatigue   . Other specified disorder of rectum and anus    Radiation Proctitus  . Paroxysmal supraventricular tachycardia ([place])   . Restless legs syndrome (RLS)   . Retinal detachment with retinal defect, unspecified   . Right bundle branch block    Incomplete  . Spermatocele    Right  . Trigger finger (acquired)   . Type II or unspecified type diabetes mellitus without mention of complication, not stated as uncontrolled   . Type II or unspecified type diabetes mellitus without mention of complication, uncontrolled   . Unspecified essential hypertension    patient denies, states he has a "fast heart rate, but no high BP"  . Vitamin D deficiency    Past Surgical History:  Procedure Laterality Date  . Angiolipoma  1980   Right arm  . APPENDECTOMY  1951  . CHOLECYSTECTOMY  01/1994  . COLONOSCOPY  04/11/2010  . COLONOSCOPY W/ POLYPECTOMY  2005   Removed 2 (two) polyps  . DUPUYTREN CONTRACTURE RELEASE Bilateral 1989  . EYE SURGERY  2006   Left  . EYE SURGERY  2006   Retina reattachment, right  . EYE SURGERY  02/1994   Cataract surgery, right  . EYE SURGERY  01/1995   Cataract surgery, left  . KNEE SURGERY  1979  . PARATHYROIDECTOMY Left 12/27/2016   Procedure: LEFT INFERIOR  PARATHYROIDECTOMY;  Surgeon: Armandina Gemma, MD;  Location: [facility];  Service: General;  Laterality: Left;  . POLYPECTOMY  1955   Vocal cord  . RETINAL DETACHMENT SURGERY Right 2005  . SHOULDER ARTHROSCOPY Right 03/22/15   [facility]  . TENDON RELEASE  01/1997   seven fingers  . TRANSURETHRAL RESECTION OF PROSTATE  08/1989  . VITRECTOMY Left 04/23/2013   [facility]   Social History:   reports that he quit smoking about 72 years ago. His smoking use included cigarettes. He quit after 4.00 years of use. He has never used smokeless tobacco. He reports that he does not drink alcohol and does not use drugs.  Family History  Problem Relation Age of Onset  . Depression Mother   . Diabetes Mother   . Mental illness Mother        OCD  . Cerebral aneurysm Father        age 10  . Hypertension Sister   . Diabetes Daughter   . Hyperlipidemia Daughter   . Breast cancer Daughter   . Breast cancer Daughter   . Hyperparathyroidism Neg Hx     Medications: Patient's Medications  New Prescriptions   No medications on file  Previous Medications   ACETAMINOPHEN (TYLENOL) 500 MG TABLET    Take 1,000 mg by mouth 3 (three) times daily.   CHOLECALCIFEROL (VITAMIN D) 400 UNITS TABS TABLET    Take 400 Units by mouth daily.   DOCUSATE SODIUM (COLACE) 100 MG CAPSULE    Take 100 mg by mouth daily as needed.    FAMOTIDINE (PEPCID AC) 10 MG CHEWABLE TABLET    Chew 10 mg by mouth daily as needed for heartburn.   GABAPENTIN (NEURONTIN) 300 MG CAPSULE    TAKE 1 CAPSULE BY MOUTH 3  TIMES DAILY   HUMALOG KWIKPEN 200 UNIT/ML KWIKPEN    INJECT SUBCUTANEOUSLY 6  UNITS TWICE DAILY AFTER  MEALS   INSULIN GLARGINE (LANTUS SOLOSTAR) 100 UNIT/ML SOLOSTAR PEN    INJECT SUBCUTANEOUSLY 46 UNITS IN THE MORNING   INSULIN PEN NEEDLE (B-D ULTRAFINE III SHORT PEN) 31G X 8 MM MISC    Use three times daily for insulin Injections. Dx:E11.51   LOPERAMIDE (IMODIUM) 1 MG/5ML SOLUTION    Take 1 mg by mouth as needed for diarrhea or loose  stools.   LORATADINE (CLARITIN) 10 MG TABLET    Take 10 mg by mouth daily as needed.    METOPROLOL TARTRATE (LOPRESSOR) 25 MG TABLET    Take 0.5 tablets (12.5 mg total) by mouth 2 (two) times daily.   MUPIROCIN OINTMENT (BACTROBAN) 2 %    One application to affected area on right leg.   PROBIOTIC PRODUCT (PROBIOTIC-10) CHEW    Chew 1 each by mouth daily.    TAMSULOSIN (FLOMAX) 0.4 MG CAPS CAPSULE    TAKE 1 CAPSULE BY MOUTH  DAILY FOR PROSTATE   TROLAMINE SALICYLATE (ASPERCREME) 10 %  CREAM    Apply 1 application topically as needed for muscle pain.  Modified Medications   No medications on file  Discontinued Medications   No medications on file    Physical Exam:  Vitals:   06/07/20 1009  BP: 126/64  Pulse: 90  Resp: 20  Temp: (!) 97.1 F (36.2 C)  TempSrc: Temporal  SpO2: 97%  Weight: 155 lb 3.2 oz (70.4 kg)  Height: 5\' 7"  (1.702 m)   Body mass index is 24.31 kg/m. Wt Readings from Last 3 Encounters:  06/07/20 155 lb 3.2 oz (70.4 kg)  05/18/20 157 lb 12.8 oz (71.6 kg)  01/04/20 156 lb (70.8 kg)    Physical Exam Vitals and nursing note reviewed.  Constitutional:      Appearance: Normal appearance.  Cardiovascular:     Rate and Rhythm: Normal rate and regular rhythm.  Pulmonary:     Breath sounds: Wheezing present.  Neurological:     General: No focal deficit present.     Mental Status: He is alert and oriented to person, place, and time.  Psychiatric:        Mood and Affect: Mood normal.        Behavior: Behavior normal.     Labs reviewed: Basic Metabolic Panel: Recent Labs    09/02/19 0928 12/31/19 0900 05/02/20 0817  NA 140 144 142  K 4.0 4.0 4.0  CL 105 109 107  CO2 26 27 27   GLUCOSE 185* 32* 116*  BUN 15 14 20   CREATININE 0.86 0.89 0.96  CALCIUM 9.7 9.8 9.3   Liver Function Tests: Recent Labs    12/31/19 0900  AST 11  ALT 11  BILITOT 0.6  PROT 6.4   No results for input(s): LIPASE, AMYLASE in the last 8760 hours. No results for input(s):  AMMONIA in the last 8760 hours. CBC: Recent Labs    09/02/19 0928  WBC 7.7  NEUTROABS 5,344  HGB 14.7  HCT 42.9  MCV 89.9  PLT 208   Lipid Panel: No results for input(s): CHOL, HDL, LDLCALC, TRIG, CHOLHDL, LDLDIRECT in the last 8760 hours. TSH: No results for input(s): TSH in the last 8760 hours. A1C: Lab Results  Component Value Date   HGBA1C 7.8 (H) 05/02/2020     Assessment/Plan  1. Wheezing Unsure of etiology.  May be related to allergic rhinitis since we are in high pollen season.  I did hear some wheezing on exam. - albuterol (VENTOLIN HFA) 108 (90 Base) MCG/ACT inhaler; Inhale 2 puffs into the lungs every 6 (six) hours as needed for wheezing or shortness of breath.  Dispense: 8 g; Refill: 0  2. Essential hypertension Blood pressure is good at 126/64.  3. DM (diabetes mellitus), type 2 with peripheral vascular complications ([place]) Diabetes is doing well.  Last A1c last month was 7.8 and I think this is acceptable given his age of 89  4. Ulnar neuropathy of left upper extremity Continues on gabapentin 300 mg 3 times a day  5. Rotator cuff syndrome of right shoulder To see orthopedic doctor tomorrow.  Depending on his plan I mentioned use of nitroglycerin patch for rotator cuff pain and he seemed interested.  Would not expect orthopedics to use this approach   Alain Honey, MD [place] [phone]

## 2020-06-07 NOTE — Patient Instructions (Signed)
Come back 6 weeks

## 2020-06-08 DIAGNOSIS — M25512 Pain in left shoulder: Secondary | ICD-10-CM | POA: Diagnosis not present

## 2020-06-21 ENCOUNTER — Other Ambulatory Visit: Payer: Self-pay | Admitting: *Deleted

## 2020-06-21 DIAGNOSIS — G5622 Lesion of ulnar nerve, left upper limb: Secondary | ICD-10-CM

## 2020-06-21 DIAGNOSIS — G5602 Carpal tunnel syndrome, left upper limb: Secondary | ICD-10-CM

## 2020-06-21 MED ORDER — GABAPENTIN 300 MG PO CAPS: 1.0000 | ORAL_CAPSULE | Freq: Three times a day (TID) | ORAL | 3 refills | Status: DC

## 2020-06-21 NOTE — Telephone Encounter (Signed)
[facility] requested refill Request.

## 2020-06-23 ENCOUNTER — Ambulatory Visit
Admission: RE | Admit: 2020-06-23 | Discharge: 2020-06-23 | Disposition: A | Discharge status: Home / Self Care | Payer: Medicare Other | Source: Ambulatory Visit | Attending: Internal Medicine | Admitting: Internal Medicine

## 2020-06-23 DIAGNOSIS — R911 Solitary pulmonary nodule: Secondary | ICD-10-CM

## 2020-06-23 DIAGNOSIS — J984 Other disorders of lung: Secondary | ICD-10-CM | POA: Diagnosis not present

## 2020-06-23 DIAGNOSIS — R0609 Other forms of dyspnea: Secondary | ICD-10-CM | POA: Diagnosis not present

## 2020-06-23 DIAGNOSIS — I2510 Atherosclerotic heart disease of native coronary artery without angina pectoris: Secondary | ICD-10-CM | POA: Diagnosis not present

## 2020-06-23 DIAGNOSIS — I313 Pericardial effusion (noninflammatory): Secondary | ICD-10-CM | POA: Diagnosis not present

## 2020-06-30 ENCOUNTER — Other Ambulatory Visit: Payer: Self-pay

## 2020-06-30 DIAGNOSIS — R490 Dysphonia: Secondary | ICD-10-CM

## 2020-07-19 ENCOUNTER — Telehealth: Payer: Self-pay

## 2020-07-19 NOTE — Telephone Encounter (Signed)
Patient's wife Estill Bamberg) called and stated that patient needs some labs done due to his diabetes. She states that patient hasn't had any labs lately and he will need labs done before his next visit on August 30,2022. Please advise.

## 2020-07-20 ENCOUNTER — Ambulatory Visit: Payer: Medicare Other | Admitting: Family Medicine

## 2020-07-20 ENCOUNTER — Other Ambulatory Visit: Payer: Self-pay | Admitting: Family Medicine

## 2020-07-20 DIAGNOSIS — E1151 Type 2 diabetes mellitus with diabetic peripheral angiopathy without gangrene: Secondary | ICD-10-CM

## 2020-07-20 NOTE — Progress Notes (Unsigned)
1C

## 2020-08-09 DIAGNOSIS — J3489 Other specified disorders of nose and nasal sinuses: Secondary | ICD-10-CM | POA: Diagnosis not present

## 2020-08-09 DIAGNOSIS — K219 Gastro-esophageal reflux disease without esophagitis: Secondary | ICD-10-CM | POA: Diagnosis not present

## 2020-09-20 ENCOUNTER — Other Ambulatory Visit: Payer: Self-pay

## 2020-09-20 ENCOUNTER — Ambulatory Visit (INDEPENDENT_AMBULATORY_CARE_PROVIDER_SITE_OTHER): Payer: Medicare Other | Admitting: Family Medicine

## 2020-09-20 ENCOUNTER — Encounter: Payer: Medicare Other | Admitting: Nurse Practitioner

## 2020-09-20 ENCOUNTER — Encounter: Payer: Self-pay | Admitting: Family Medicine

## 2020-09-20 VITALS — BP 130/68 | HR 88 | Temp 97.7°F | Ht 67.0 in | Wt 159.0 lb

## 2020-09-20 DIAGNOSIS — G8929 Other chronic pain: Secondary | ICD-10-CM | POA: Diagnosis not present

## 2020-09-20 DIAGNOSIS — E1142 Type 2 diabetes mellitus with diabetic polyneuropathy: Secondary | ICD-10-CM | POA: Diagnosis not present

## 2020-09-20 DIAGNOSIS — M25512 Pain in left shoulder: Secondary | ICD-10-CM | POA: Diagnosis not present

## 2020-09-20 DIAGNOSIS — E1151 Type 2 diabetes mellitus with diabetic peripheral angiopathy without gangrene: Secondary | ICD-10-CM | POA: Diagnosis not present

## 2020-09-20 DIAGNOSIS — M170 Bilateral primary osteoarthritis of knee: Secondary | ICD-10-CM

## 2020-09-20 DIAGNOSIS — E785 Hyperlipidemia, unspecified: Secondary | ICD-10-CM | POA: Diagnosis not present

## 2020-09-20 DIAGNOSIS — E1169 Type 2 diabetes mellitus with other specified complication: Secondary | ICD-10-CM | POA: Diagnosis not present

## 2020-09-20 DIAGNOSIS — I10 Essential (primary) hypertension: Secondary | ICD-10-CM | POA: Diagnosis not present

## 2020-09-20 MED ORDER — NITROGLYCERIN 0.1 MG/HR TD PT24: 0.2000 mg | MEDICATED_PATCH | Freq: Every day | TRANSDERMAL | Status: DC

## 2020-09-20 NOTE — Progress Notes (Signed)
Provider:  Alain Honey, MD  Careteam: Patient Care Team: [name], MD as PCP - General (Family Medicine) Netta Cedars, MD as Consulting Physician (Orthopedic Surgery) Jasmine Awe, MD as Referring Physician (Ophthalmology) Rana Snare, MD (Inactive) as Consulting Physician (Urology)  PLACE OF SERVICE:  [facility]  Advanced Directive information    Allergies  Allergen Reactions   Aspirin Other (See Comments)    Bothers stomach, thins blood    Atorvastatin Other (See Comments)    Muscular pain and weakness   Prednisone Other (See Comments)    Upset stomach, can take by shot not by mouth   Simvastatin Other (See Comments)    Muscular pain and weakness   Sulfa Antibiotics Other (See Comments)    Upset stomach    Other Other (See Comments)   Latex Rash   Metformin Hcl Nausea Only   Voltaren [Diclofenac Sodium] Other (See Comments)    Unknown    Chief Complaint  Patient presents with   Medical Management of Chronic Issues    Patient presents today for 4 month follow-up.   Quality Metric Gaps    Eye exam, Zoster, Flu, Covid vaccines.     HPI: Patient is a 87 y.o. male.  Micheal [name] has lots of orthopedic complaints today including knee pain shoulder pain and left hand and arm pain that sound like carpal tunnel.  He has been told by orthopedics that he is not a surgical candidate for knee replacement. He also complains of severe neuropathy.  He feels gabapentin is helpful but feels like a higher dose might be more efficacious. He saw ENT since his last visit.  Laryngoscopy was negative and it was suggested that he might take a pill for acid to see if that would help his hoarseness.  He seems to still be concerned that he may have parathyroid problems.  We will check his calcium today  Review of Systems:  Review of Systems  Constitutional:  Positive for malaise/fatigue.  Musculoskeletal:  Positive for joint pain.       Multiple joints including  bilateral knees and left shoulder primarily  Neurological: Negative.   Psychiatric/Behavioral: Negative.    All other systems reviewed and are negative.  Past Medical History:  Diagnosis Date   Allergic rhinitis, cause unspecified    Carpal tunnel syndrome    Cervical spondylosis without myelopathy    Cervical spondylosis without myelopathy    Cervicalgia    Diverticulosis of colon (without mention of hemorrhage)    Esophageal reflux    Hypertrophy of prostate with urinary obstruction and other lower urinary tract symptoms (LUTS)    Insomnia, unspecified    Internal hemorrhoids without mention of complication    Irritable bowel syndrome    Macular degeneration (senile) of retina, unspecified    Malignant neoplasm of prostate (HCC)    Neurogenic bladder, NOS    Neuropathy    Nonspecific (abnormal) findings on radiological and other examination of gastrointestinal tract    Orthostatic hypotension    Osteoarthrosis, unspecified whether generalized or localized, unspecified site    Other and unspecified hyperlipidemia    Other malaise and fatigue    Other specified disorder of rectum and anus    Radiation Proctitus   Paroxysmal supraventricular tachycardia (HCC)    Restless legs syndrome (RLS)    Retinal detachment with retinal defect, unspecified    Right bundle branch block    Incomplete   Spermatocele    Right   Trigger finger (  acquired)    Type II or unspecified type diabetes mellitus without mention of complication, not stated as uncontrolled    Type II or unspecified type diabetes mellitus without mention of complication, uncontrolled    Unspecified essential hypertension    patient denies, states he has a "fast heart rate, but no high BP"   Vitamin D deficiency    Past Surgical History:  Procedure Laterality Date   Angiolipoma  1980   Right arm   APPENDECTOMY  1951   CHOLECYSTECTOMY  01/1994   COLONOSCOPY  04/11/2010   COLONOSCOPY W/ POLYPECTOMY  2005   Removed 2  (two) polyps   DUPUYTREN CONTRACTURE RELEASE Bilateral 1989   EYE SURGERY  2006   Left   EYE SURGERY  2006   Retina reattachment, right   EYE SURGERY  02/1994   Cataract surgery, right   EYE SURGERY  01/1995   Cataract surgery, left   KNEE SURGERY  1979   PARATHYROIDECTOMY Left 12/27/2016   Procedure: LEFT INFERIOR PARATHYROIDECTOMY;  Surgeon: Armandina Gemma, MD;  Location: [facility];  Service: General;  Laterality: Left;   POLYPECTOMY  1955   Vocal cord   RETINAL DETACHMENT SURGERY Right 2005   SHOULDER ARTHROSCOPY Right 03/22/15   [facility]   TENDON RELEASE  01/1997   seven fingers   TRANSURETHRAL RESECTION OF PROSTATE  08/1989   VITRECTOMY Left 04/23/2013   [facility]   Social History:   reports that he quit smoking about 72 years ago. His smoking use included cigarettes. He has never used smokeless tobacco. He reports that he does not drink alcohol and does not use drugs.  Family History  Problem Relation Age of Onset   Depression Mother    Diabetes Mother    Mental illness Mother        OCD   Cerebral aneurysm Father        age 58   Hypertension Sister    Diabetes Daughter    Hyperlipidemia Daughter    Breast cancer Daughter    Breast cancer Daughter    Hyperparathyroidism Neg Hx     Medications: Patient's Medications  New Prescriptions   No medications on file  Previous Medications   ACETAMINOPHEN (TYLENOL) 500 MG TABLET    Take 1,000 mg by mouth 3 (three) times daily.   ALBUTEROL (VENTOLIN HFA) 108 (90 BASE) MCG/ACT INHALER    Inhale 2 puffs into the lungs every 6 (six) hours as needed for wheezing or shortness of breath.   CHOLECALCIFEROL (VITAMIN D) 400 UNITS TABS TABLET    Take 400 Units by mouth daily.   DOCUSATE SODIUM (COLACE) 100 MG CAPSULE    Take 100 mg by mouth daily as needed.    FAMOTIDINE (PEPCID AC) 10 MG CHEWABLE TABLET    Chew 10 mg by mouth daily as needed for heartburn.   GABAPENTIN (NEURONTIN) 300 MG CAPSULE    Take 1 capsule (300 mg total) by  mouth 3 (three) times daily.   HUMALOG KWIKPEN 200 UNIT/ML KWIKPEN    INJECT SUBCUTANEOUSLY 6  UNITS TWICE DAILY AFTER  MEALS   INSULIN GLARGINE (LANTUS SOLOSTAR) 100 UNIT/ML SOLOSTAR PEN    INJECT SUBCUTANEOUSLY 46 UNITS IN THE MORNING   INSULIN PEN NEEDLE (B-D ULTRAFINE III SHORT PEN) 31G X 8 MM MISC    Use three times daily for insulin Injections. Dx:E11.51   LOPERAMIDE (IMODIUM) 1 MG/5ML SOLUTION    Take 1 mg by mouth as needed for diarrhea or loose stools.  LORATADINE (CLARITIN) 10 MG TABLET    Take 10 mg by mouth daily as needed.    METOPROLOL TARTRATE (LOPRESSOR) 25 MG TABLET    Take 0.5 tablets (12.5 mg total) by mouth 2 (two) times daily.   MUPIROCIN OINTMENT (BACTROBAN) 2 %    One application to affected area on right leg.   PROBIOTIC PRODUCT (PROBIOTIC-10) CHEW    Chew 1 each by mouth daily.    TAMSULOSIN (FLOMAX) 0.4 MG CAPS CAPSULE    TAKE 1 CAPSULE BY MOUTH  DAILY FOR PROSTATE   TROLAMINE SALICYLATE (ASPERCREME) 10 % CREAM    Apply 1 application topically as needed for muscle pain.  Modified Medications   No medications on file  Discontinued Medications   No medications on file    Physical Exam:  Vitals:   09/20/20 1016  BP: 130/68  Pulse: 88  Temp: 97.7 F (36.5 C)  SpO2: 97%  Weight: 159 lb (72.1 kg)  Height: 5' 7"  (1.702 m)   Body mass index is 24.9 kg/m. Wt Readings from Last 3 Encounters:  09/20/20 159 lb (72.1 kg)  06/07/20 155 lb 3.2 oz (70.4 kg)  05/18/20 157 lb 12.8 oz (71.6 kg)    Physical Exam Vitals and nursing note reviewed.  Constitutional:      Appearance: Normal appearance.  Cardiovascular:     Rate and Rhythm: Normal rate and regular rhythm.  Pulmonary:     Effort: Pulmonary effort is normal.     Breath sounds: Normal breath sounds.  Musculoskeletal:        General: Normal range of motion.  Neurological:     General: No focal deficit present.     Mental Status: He is alert and oriented to person, place, and time.    Labs  reviewed: Basic Metabolic Panel: Recent Labs    12/31/19 0900 05/02/20 0817  NA 144 142  K 4.0 4.0  CL 109 107  CO2 27 27  GLUCOSE 32* 116*  BUN 14 20  CREATININE 0.89 0.96  CALCIUM 9.8 9.3   Liver Function Tests: Recent Labs    12/31/19 0900  AST 11  ALT 11  BILITOT 0.6  PROT 6.4   No results for input(s): LIPASE, AMYLASE in the last 8760 hours. No results for input(s): AMMONIA in the last 8760 hours. CBC: No results for input(s): WBC, NEUTROABS, HGB, HCT, MCV, PLT in the last 8760 hours. Lipid Panel: No results for input(s): CHOL, HDL, LDLCALC, TRIG, CHOLHDL, LDLDIRECT in the last 8760 hours. TSH: No results for input(s): TSH in the last 8760 hours. A1C: Lab Results  Component Value Date   HGBA1C 7.8 (H) 05/02/2020     Assessment/Plan  1. Chronic left shoulder pain Per orthopedics not a surgical candidate.  We will try nitroglycerin patch for symptomatic relief of shoulder pain - nitroGLYCERIN (NITRODUR - Dosed in mg/24 hr) patch 0.2 mg  2. Hyperlipidemia associated with type 2 diabetes mellitus (HCC) Last lipid panel showed LDL of 171.  Given his diabetes he may benefit from statin but reluctant to begin at his age - Lipid Panel - CMP with eGFR([facility])  3. DM (diabetes mellitus), type 2 with peripheral vascular complications ([place]) He continues with long and short acting insulin last A1c was 7.8.  We will reassess today - Hemoglobin A1c  4. Diabetic polyneuropathy associated with type 2 diabetes mellitus (HCC) Increase gabapentin to 600 mg at nighttime for 1 week and then may increase to 600 and morning  5. Essential hypertension  Blood pressure is good at 130/68  7. Primary osteoarthritis of both knees Not a surgical candidate and injections in his knee is no longer help.  He is walking some which I encourage but may need to limit due to the arthritis   Alain Honey, MD [place] [phone]

## 2020-09-20 NOTE — Patient Instructions (Signed)
Wear wrist splint  May try Pepcid for acid and hoarseness

## 2020-09-21 ENCOUNTER — Other Ambulatory Visit: Payer: Self-pay | Admitting: *Deleted

## 2020-09-21 DIAGNOSIS — X32XXXD Exposure to sunlight, subsequent encounter: Secondary | ICD-10-CM | POA: Diagnosis not present

## 2020-09-21 DIAGNOSIS — L570 Actinic keratosis: Secondary | ICD-10-CM | POA: Diagnosis not present

## 2020-09-21 DIAGNOSIS — D225 Melanocytic nevi of trunk: Secondary | ICD-10-CM | POA: Diagnosis not present

## 2020-09-21 LAB — COMPLETE METABOLIC PANEL WITH GFR
AG Ratio: 2.4 (calc) (ref 1.0–2.5)
ALT: 11 U/L (ref 9–46)
AST: 14 U/L (ref 10–35)
Albumin: 4.6 g/dL (ref 3.6–5.1)
Alkaline phosphatase (APISO): 75 U/L (ref 35–144)
BUN: 16 mg/dL (ref 7–25)
CO2: 27 mmol/L (ref 20–32)
Calcium: 9.6 mg/dL (ref 8.6–10.3)
Chloride: 107 mmol/L (ref 98–110)
Creat: 0.93 mg/dL (ref 0.70–1.22)
Globulin: 1.9 g/dL (calc) (ref 1.9–3.7)
Glucose, Bld: 89 mg/dL (ref 65–139)
Potassium: 4.4 mmol/L (ref 3.5–5.3)
Sodium: 141 mmol/L (ref 135–146)
Total Bilirubin: 0.5 mg/dL (ref 0.2–1.2)
Total Protein: 6.5 g/dL (ref 6.1–8.1)
eGFR: 78 mL/min/{1.73_m2} (ref >=60)

## 2020-09-21 LAB — LIPID PANEL
Cholesterol: 245 mg/dL — ABNORMAL HIGH (ref <200)
HDL: 40 mg/dL (ref >=40)
LDL Cholesterol (Calc): 154 mg/dL (calc) — ABNORMAL HIGH
Non-HDL Cholesterol (Calc): 205 mg/dL (calc) — ABNORMAL HIGH (ref <130)
Total CHOL/HDL Ratio: 6.1 (calc) — ABNORMAL HIGH (ref <5.0)
Triglycerides: 334 mg/dL — ABNORMAL HIGH (ref <150)

## 2020-09-21 LAB — HEMOGLOBIN A1C
Hgb A1c MFr Bld: 7.1 % of total Hgb — ABNORMAL HIGH (ref <5.7)
Mean Plasma Glucose: 157 mg/dL
eAG (mmol/L): 8.7 mmol/L

## 2020-09-21 MED ORDER — NITROGLYCERIN 0.2 MG/HR TD PT24: 0.2000 mg | MEDICATED_PATCH | Freq: Every day | TRANSDERMAL | 0 refills | Status: DC

## 2020-09-21 NOTE — Telephone Encounter (Signed)
Patient wife called and stated that Dr. Sabra Heck was to call in a Rx yesterday after OV for Nitroglycerin strips for patient's shoulder pain but pharmacy has not received yet. (Wrong Nitroglycerin was entered, entered as a syringe)  Reviewed chart note dated 09/20/2020: Assessment/Plan   1. Chronic left shoulder pain Per orthopedics not a surgical candidate.  We will try nitroglycerin patch for symptomatic relief of shoulder pain - nitroGLYCERIN (NITRODUR - Dosed in mg/24 hr) patch 0.2 mg   Current medication list updated and Rx sent to pharmacy.

## 2020-09-22 ENCOUNTER — Other Ambulatory Visit: Payer: Self-pay

## 2020-09-22 ENCOUNTER — Encounter: Payer: Medicare Other | Admitting: Nurse Practitioner

## 2020-09-24 IMAGING — CR CHEST - 2 VIEW
2 series · 2 of 2 positions shown · non-contrast
Comparison: Chest radiograph dated 02/25/2015

CLINICAL DATA: 89-year-old male with fever.

EXAM:
CHEST - 2 VIEW

[chest lat]
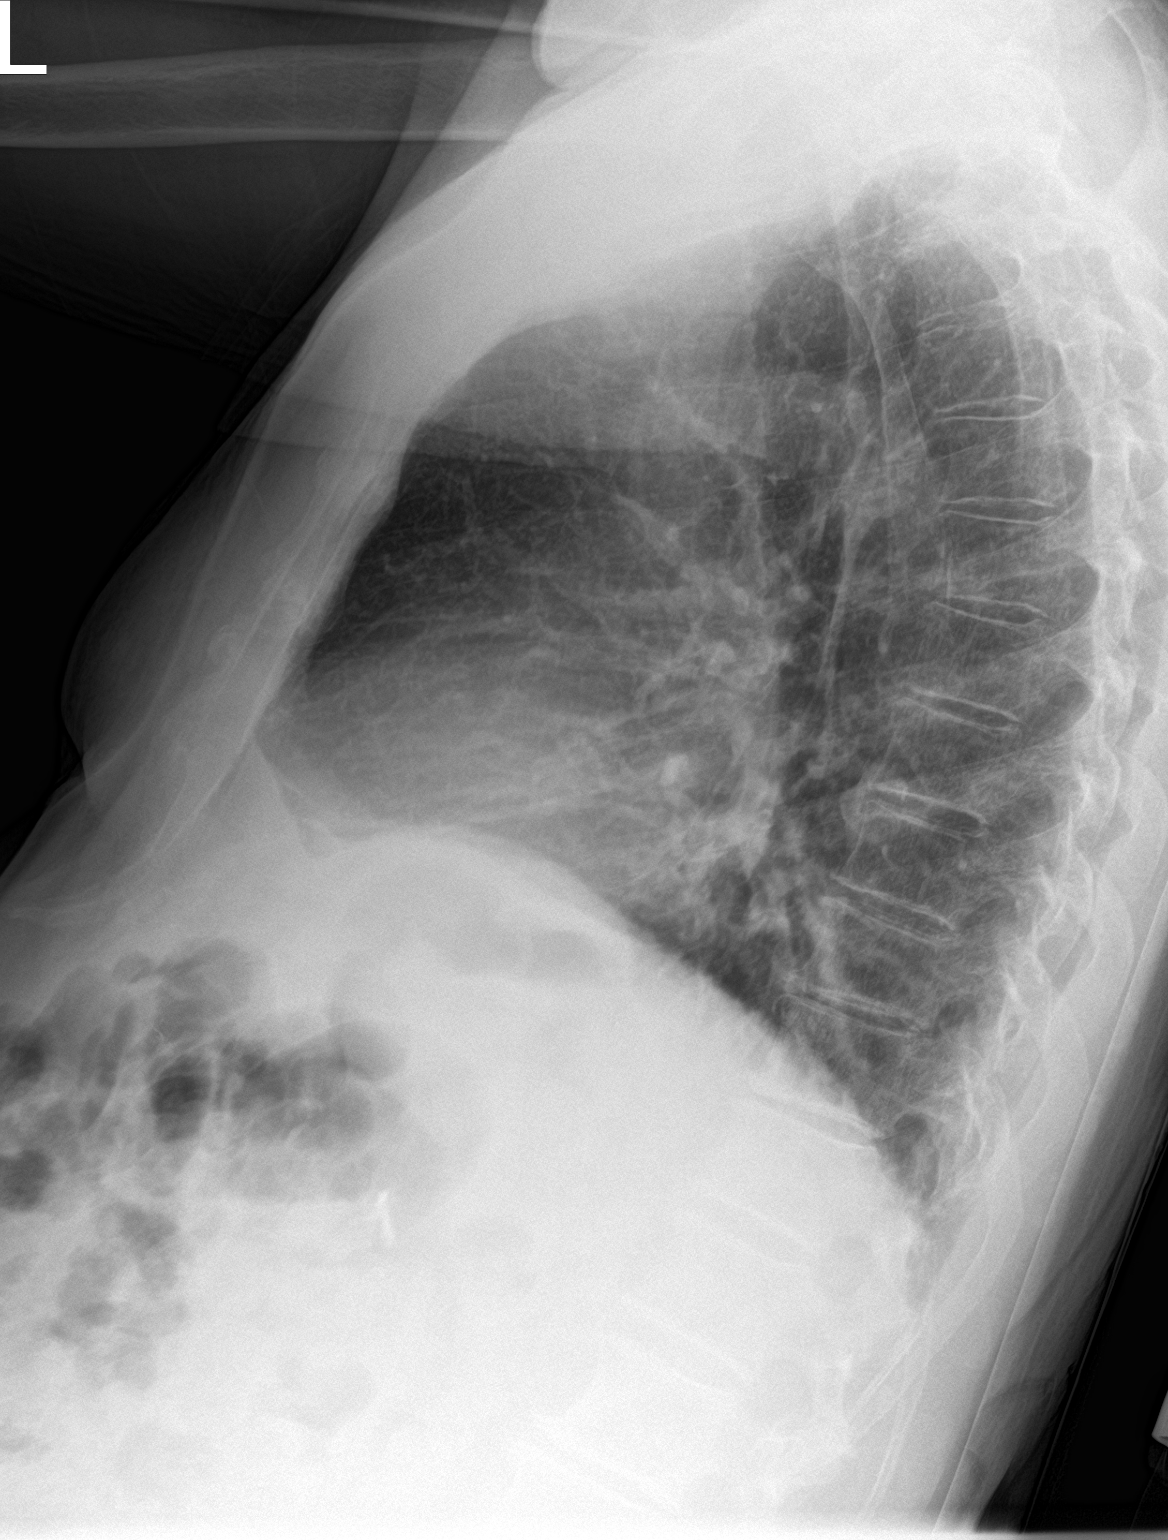

[chest ap]
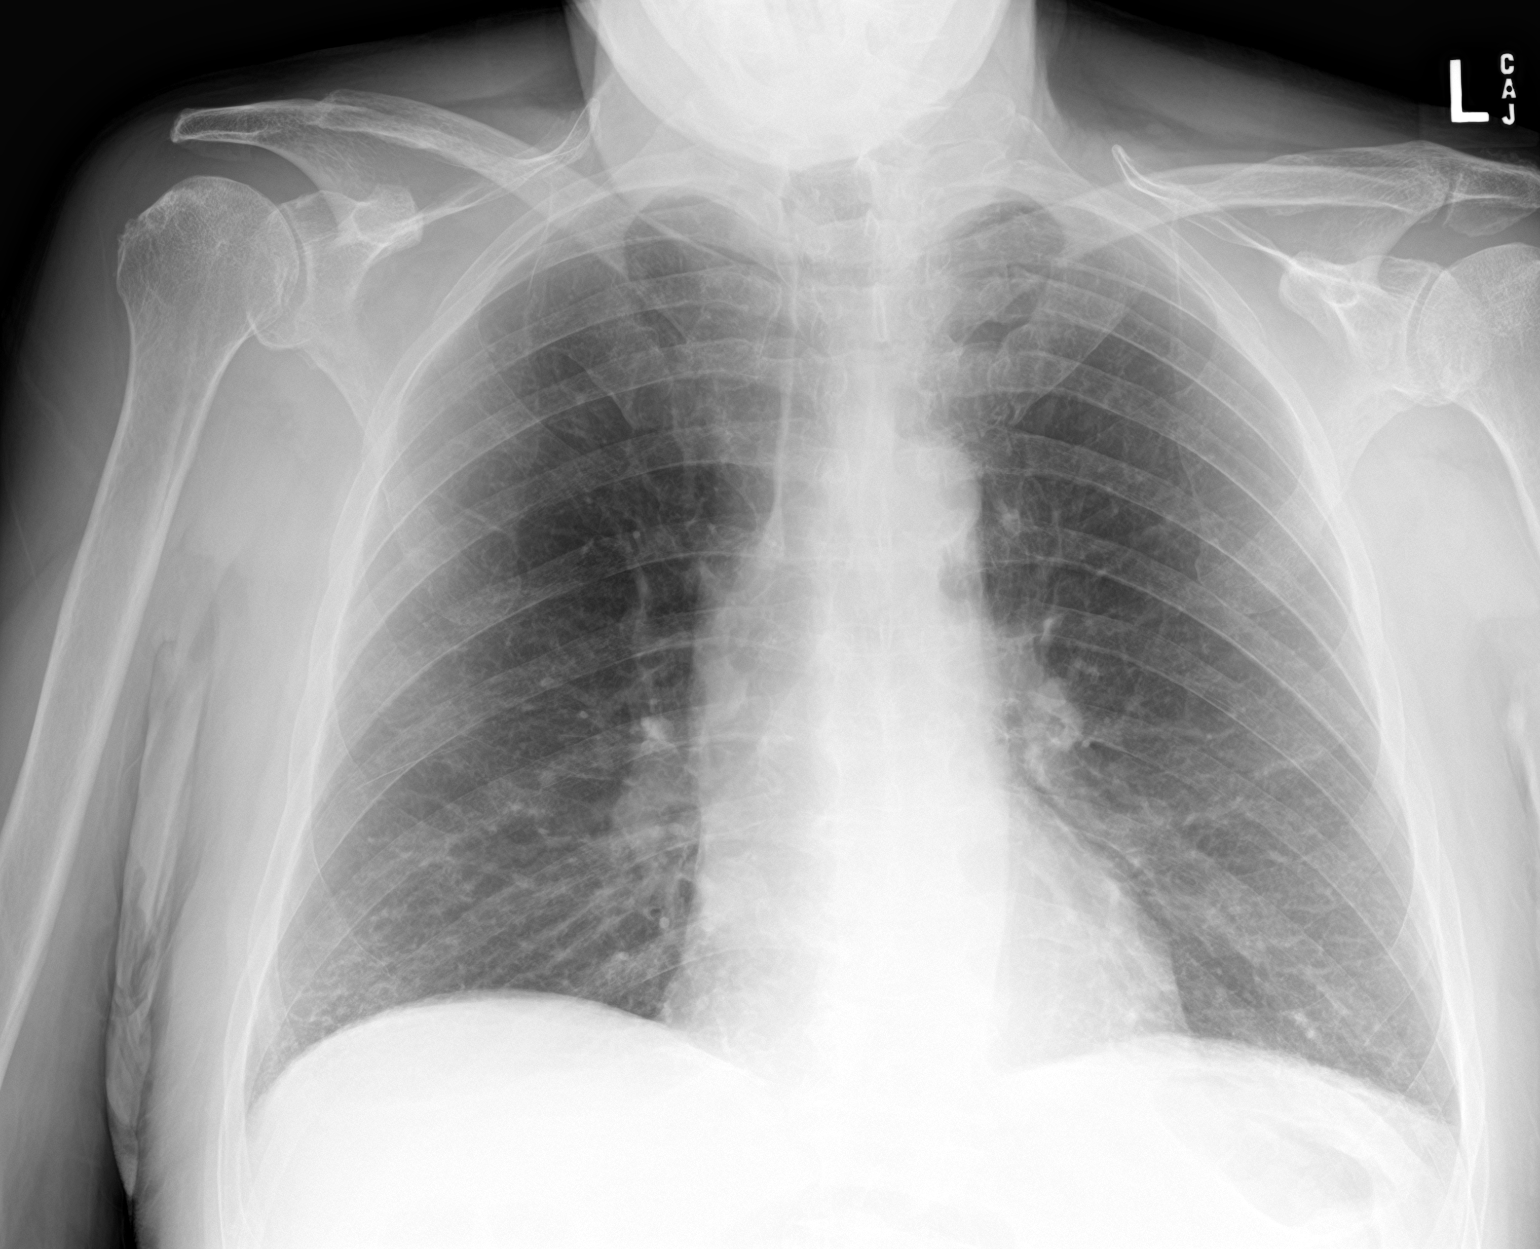

[2 of 2 positions shown; findings below may reference images not displayed]

FINDINGS: The heart size and mediastinal contours are within normal limits.
Both lungs are clear. The visualized skeletal structures are
unremarkable.
IMPRESSION: No active cardiopulmonary disease.

## 2020-09-24 IMAGING — US LEFT LOWER EXTREMITY SOFT TISSUE ULTRASOUND LIMITED
1 series · 14 of 16 positions shown · non-contrast
Comparison: NO PRIORS.

CLINICAL DATA: 89-year-old male with history of puncture wound in
the mid lateral left leg. High fevers. Evaluate for potential
abscess.

EXAM:
ULTRASOUND LEFT LOWER EXTREMITY LIMITED
TECHNIQUE: Ultrasound examination of the lower extremity soft tissues was
performed in the area of clinical concern.

[Series 1: left lower extremity soft tissue ultrasound limite · 16 acquisitions, 14 frames shown]
[im 1/16]
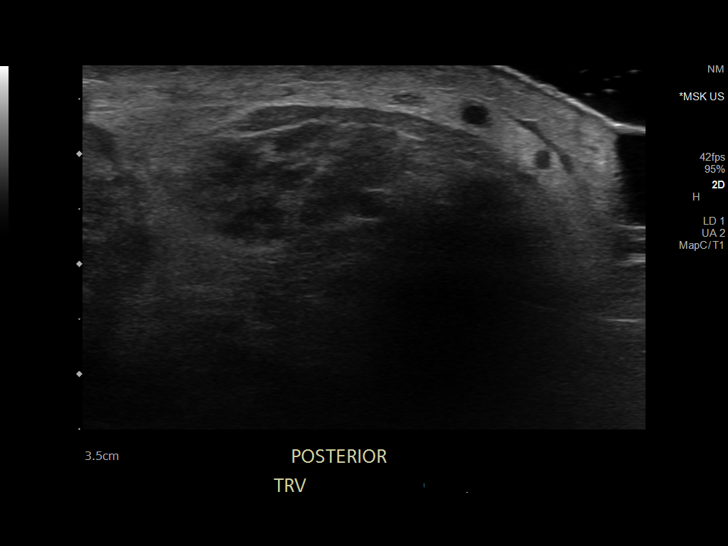
[im 2/16]
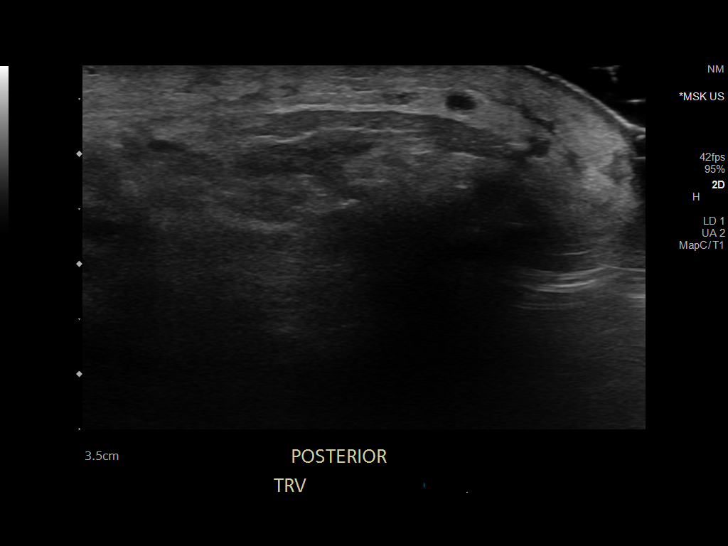
[im 3/16]
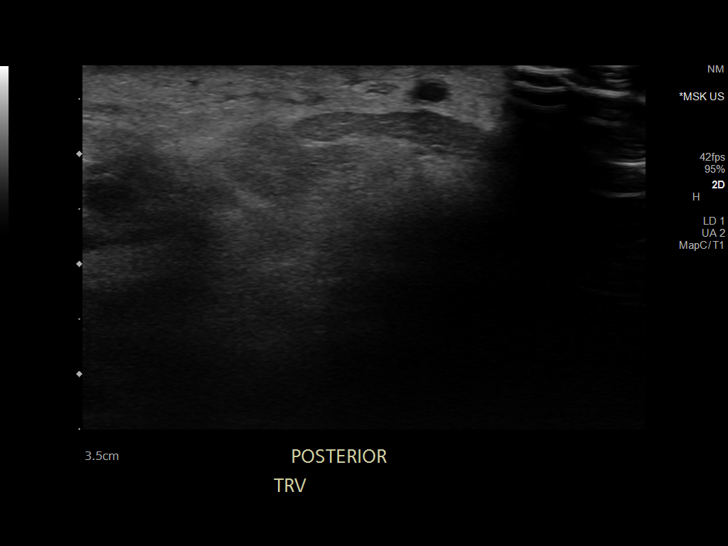
[im 5/16]
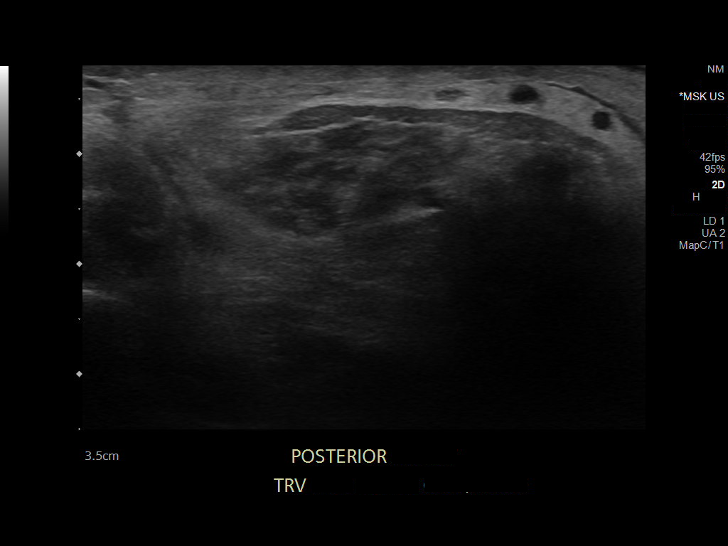
[im 6/16]
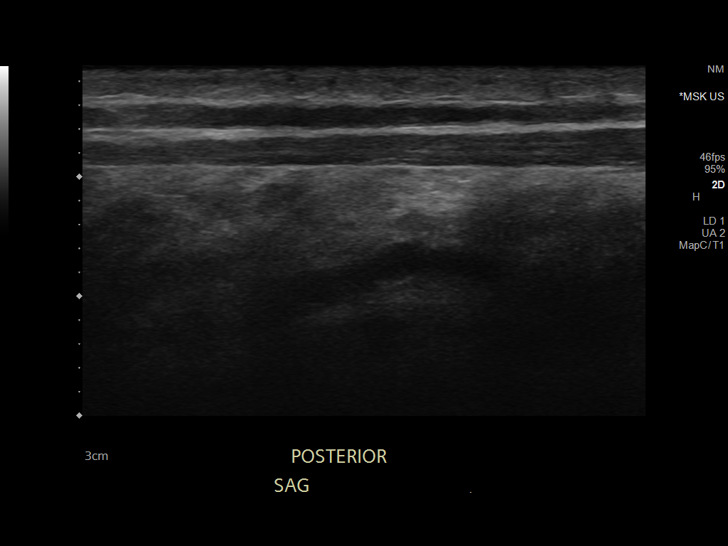
[im 7/16]
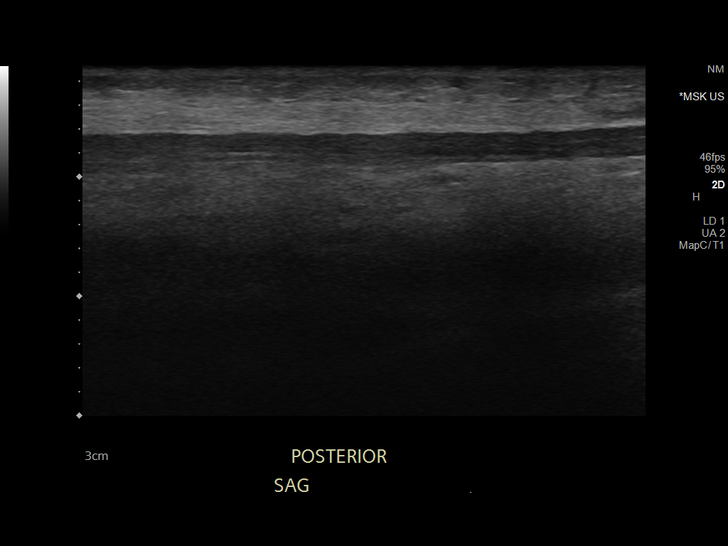
[im 8/16]
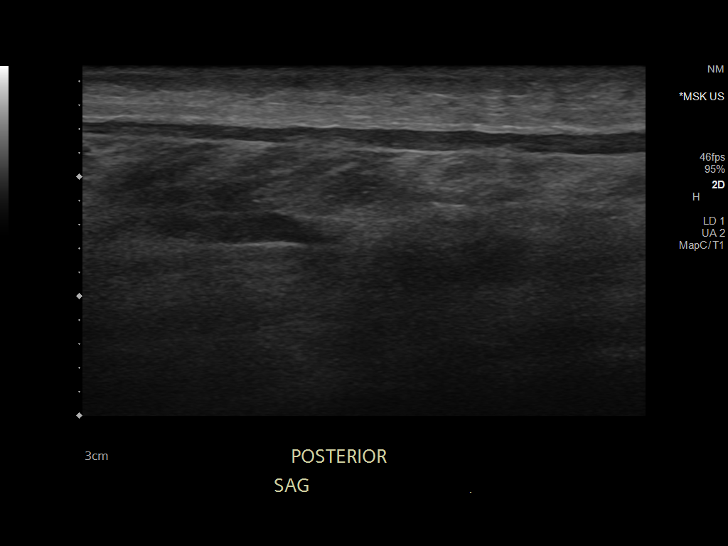
[im 9/16]
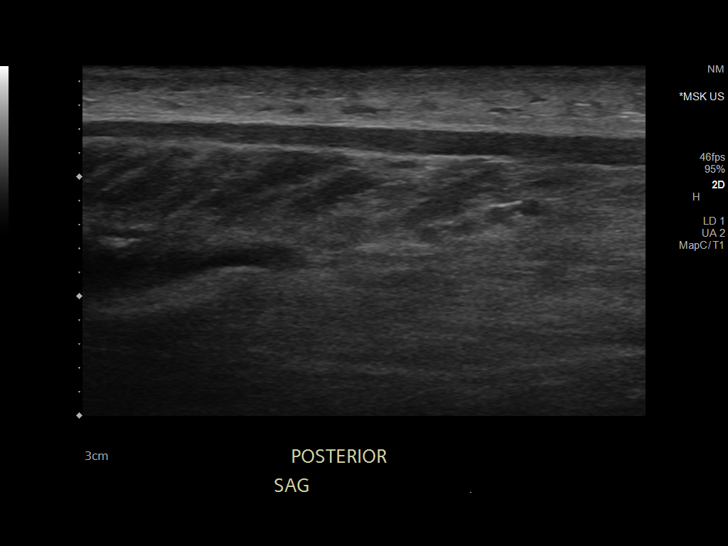
[im 10/16]
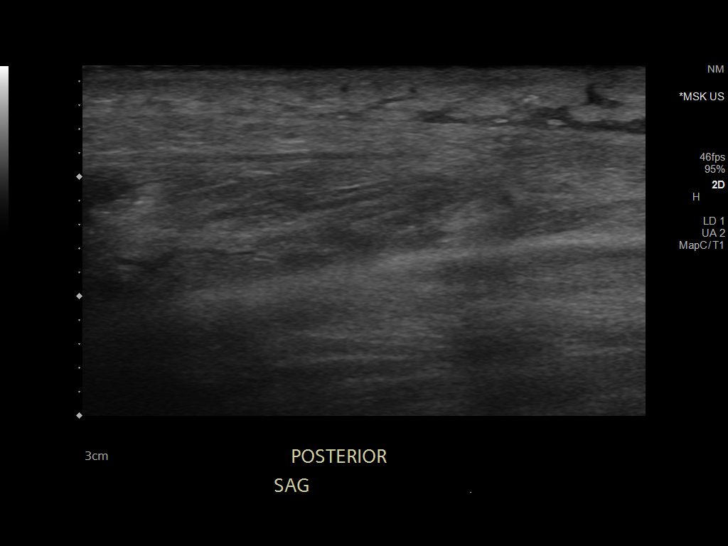
[im 11/16]
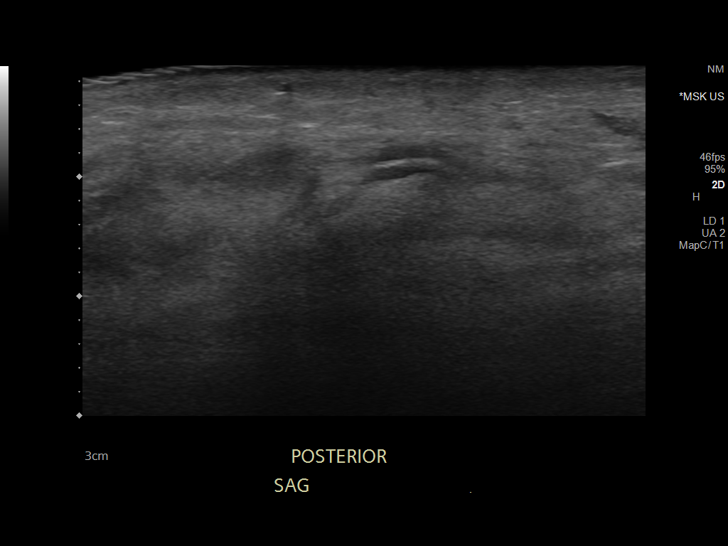
[im 13/16]
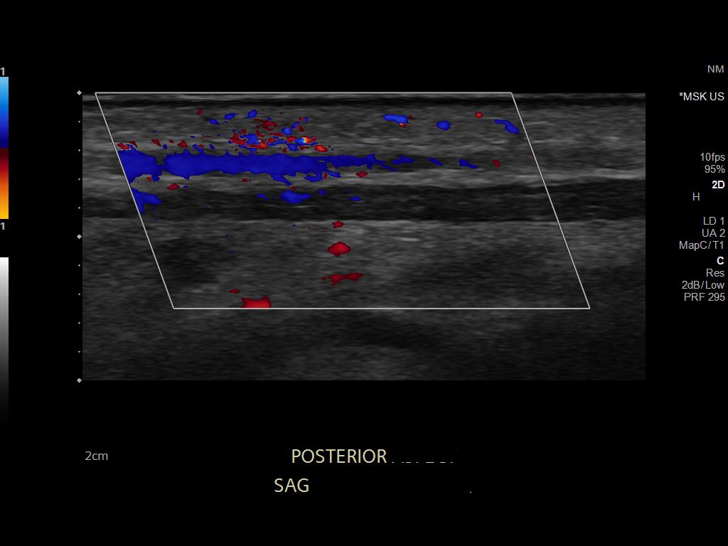
[im 14/16]
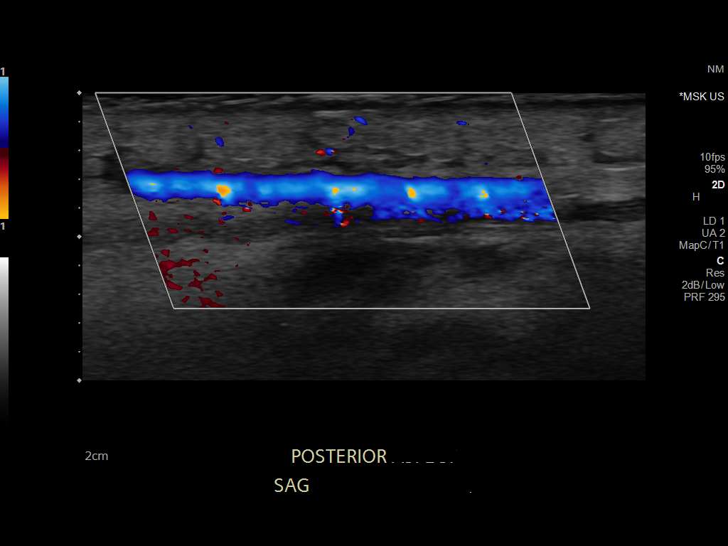
[im 15/16]
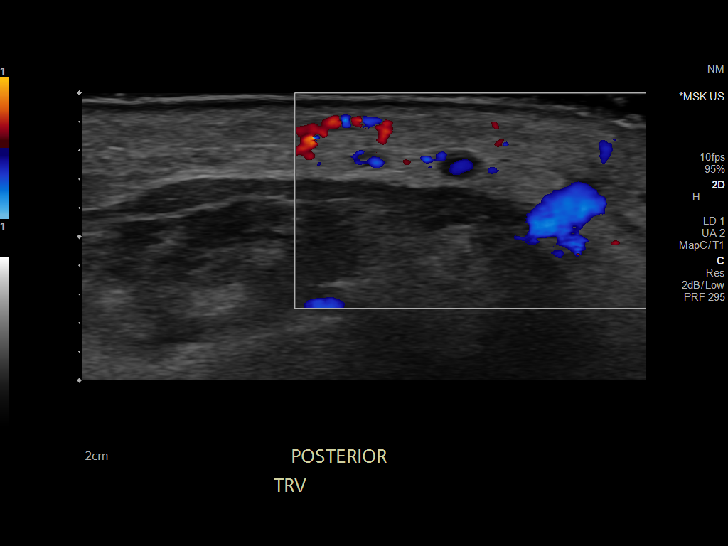
[im 16/16]
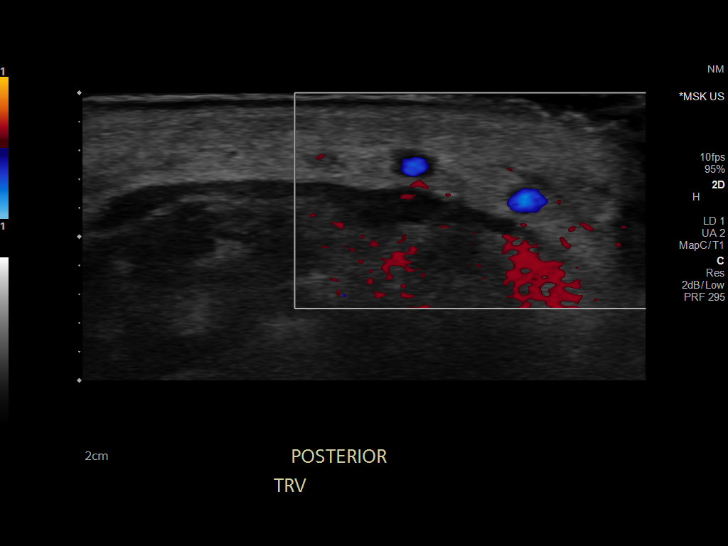

[14 of 16 positions shown; findings below may reference images not displayed]

FINDINGS: Joint Space: N/A.

Muscles: Normal.

Tendons: Normal

Other Soft Tissue Structures: Normal.
IMPRESSION: 1. No acute findings in the area of concern. Specifically, no focal
fluid collection to suggest abscess.

## 2020-09-26 IMAGING — MR MRI LUMBAR SPINE WITHOUT AND WITH CONTRAST
7 of 15 series · 21 of 48 positions shown · IV contrast ([facility]ist)
Comparison: None.

CLINICAL DATA: Fever, pain, bilateral hip pain and back pain

EXAM:
MRI LUMBAR SPINE WITHOUT AND WITH CONTRAST
TECHNIQUE: Multiplanar and multiecho pulse sequences of the lumbar spine were
obtained without and with intravenous contrast.
CONTRAST:  8 mL Gadavist

[Series 5: T2 · sagittal · 4.0mm · 0.73mm/px · 3 of 16 slices shown (1 of 2)]
[im 1/16]
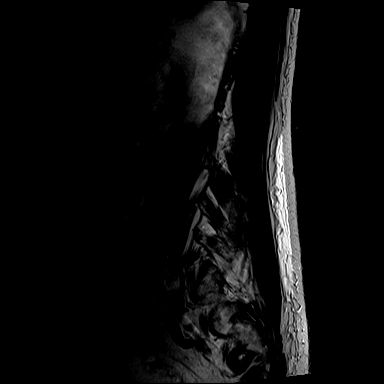
[im 8/16]
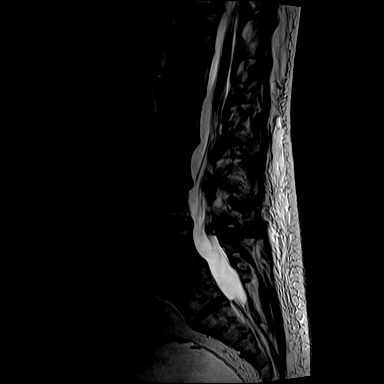
[im 16/16]
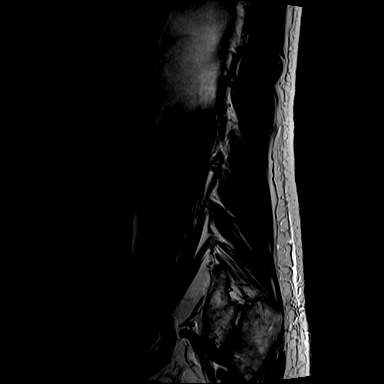

[Series 7: T1 · sagittal · 4.0mm · 0.88mm/px · 2 of 16 slices shown (1 of 4)]
[im 1/16]
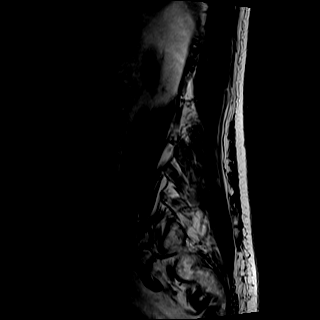
[im 16/16]
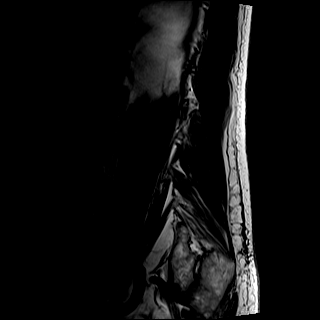

[Series 8: T2 · axial · 4.0mm · 0.57mm/px · z∈[-170,+20]mm · 4 of 34 slices shown (2 of 2)]
[im 1/34]
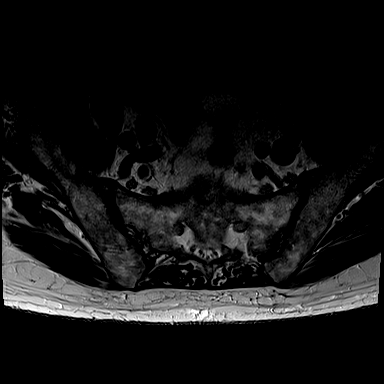
[im 12/34]
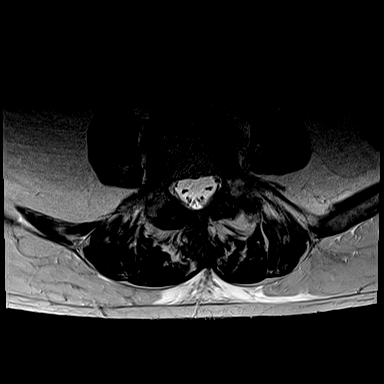
[im 23/34]
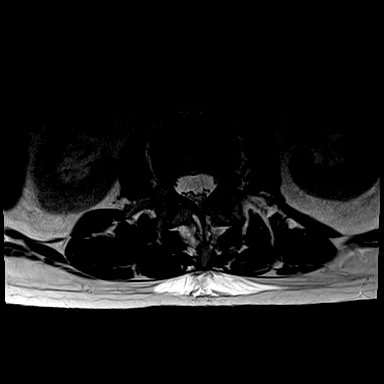
[im 34/34]
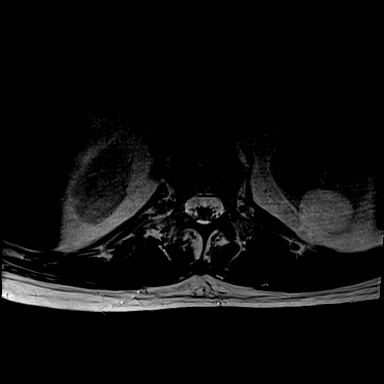

[Series 9: T1 · axial · 4.0mm · 0.34mm/px · z∈[-170,+20]mm · 4 of 34 slices shown (2 of 4)]
[im 1/34]
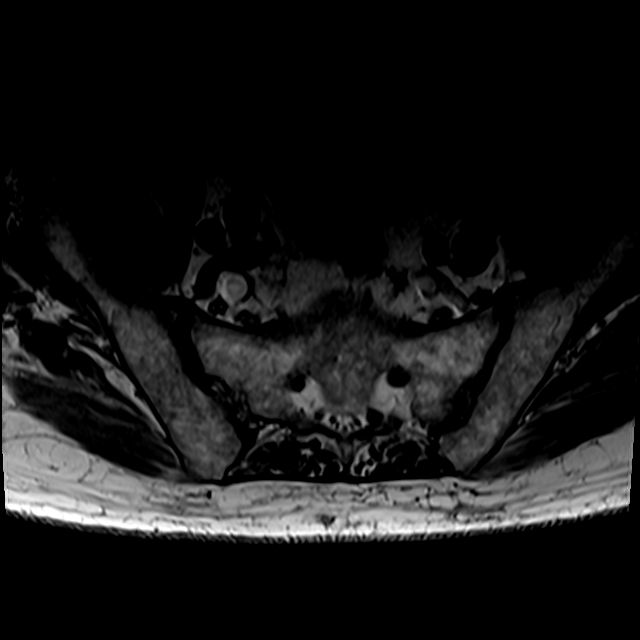
[im 12/34]
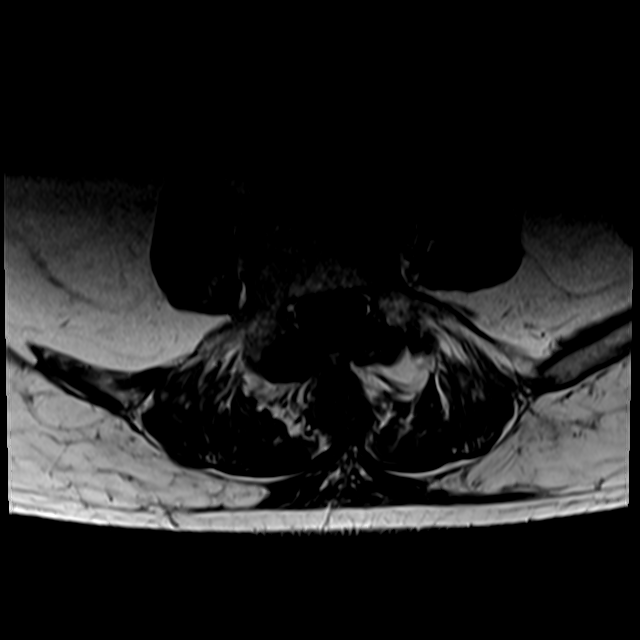
[im 23/34]
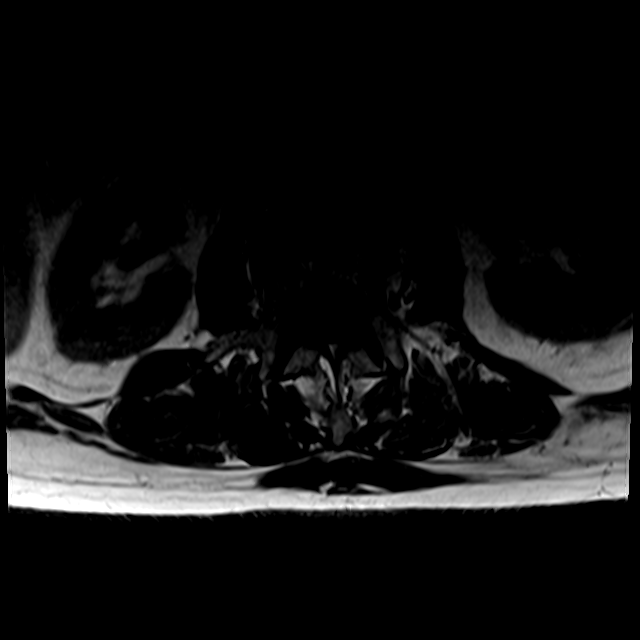
[im 34/34]
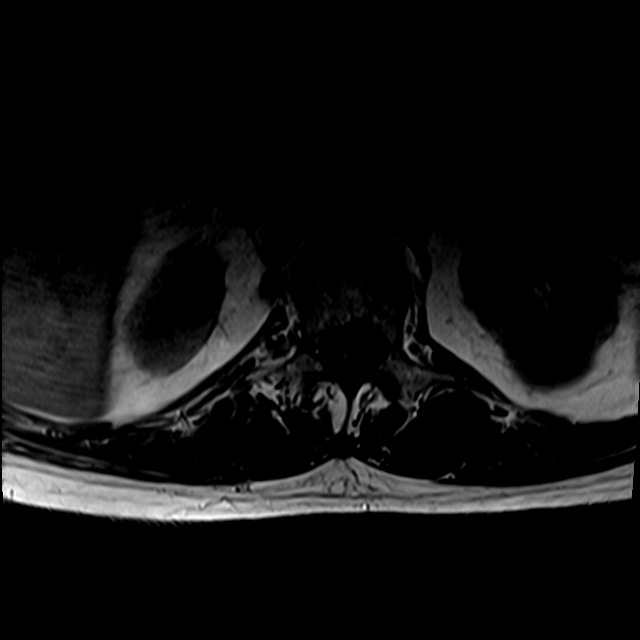

[Series 11: T1 · axial · 4.0mm · 0.43mm/px · z∈[-308,-184]mm · 4 of 28 slices shown (3 of 4)]
[im 1/28]
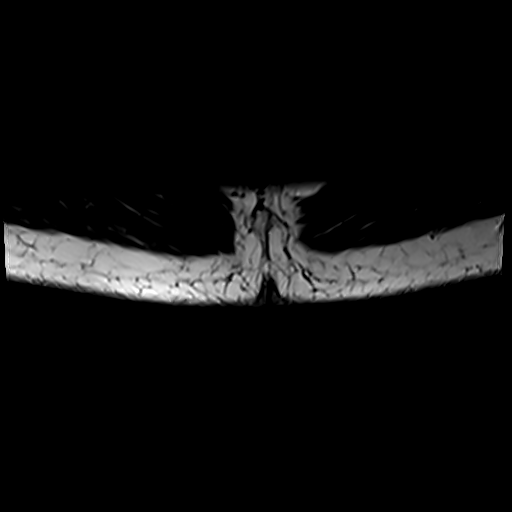
[im 10/28]
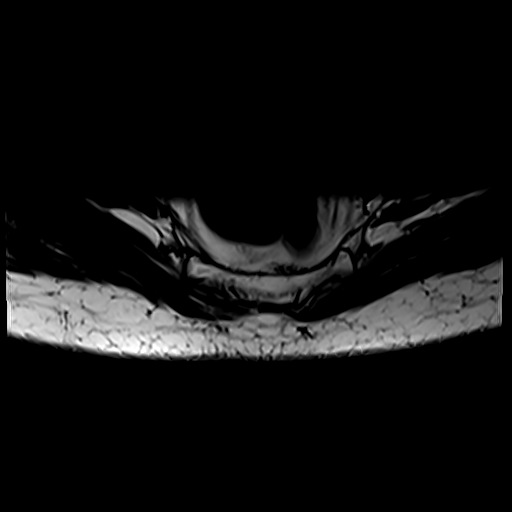
[im 19/28]
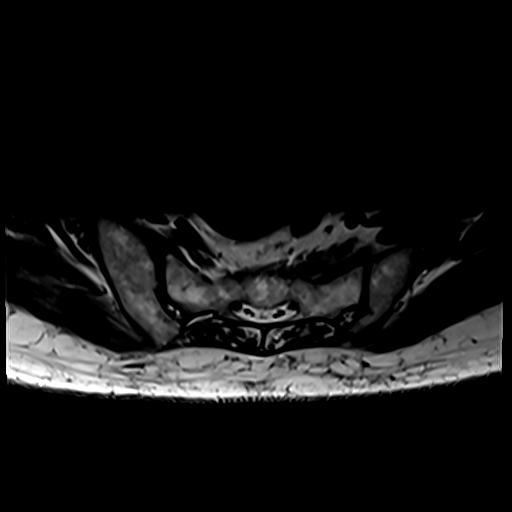
[im 28/28]
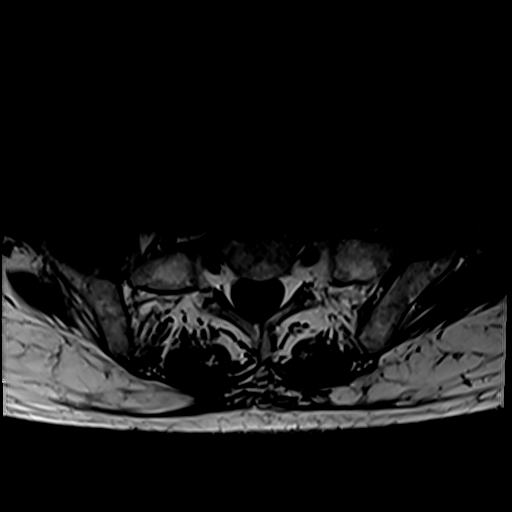

[Series 15: T1 · coronal · 3.0mm · 0.45mm/px · 2 of 19 slices shown (4 of 4)]
[im 1/19]
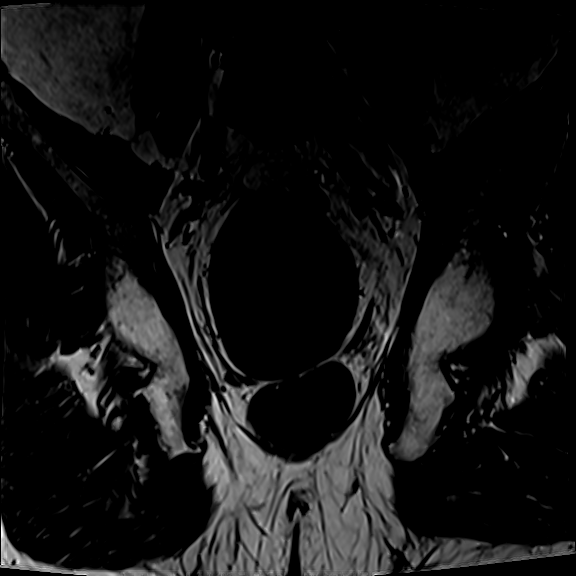
[im 19/19]
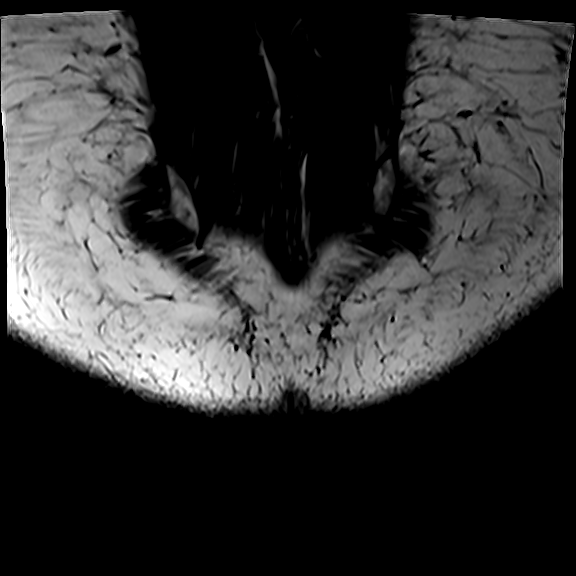

[Series 16: T1 fat-sat · axial · non-contrast · 4.0mm · 0.43mm/px · z∈[-308,-266]mm · 2 of 28 slices shown]
[im 1/28]
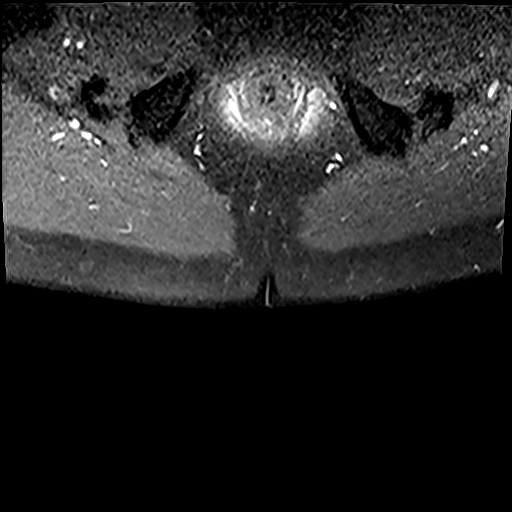
[im 10/28]
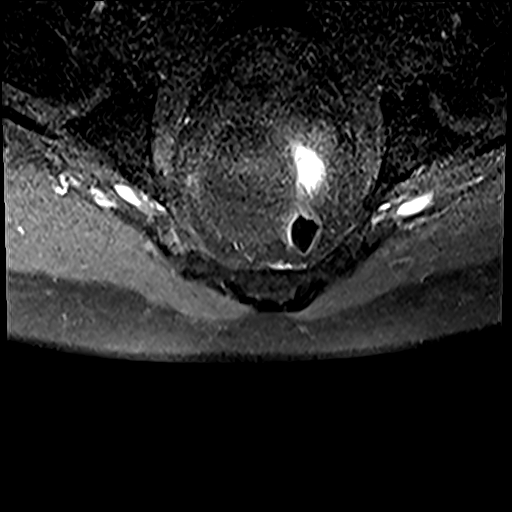

[21 of 48 positions shown; findings below may reference images not displayed]

FINDINGS: Segmentation:  Standard.

Alignment: 2 mm retrolisthesis L2 on L3. Minimal grade 1
anterolisthesis of L5 on S1.

Vertebrae:  No fracture, evidence of discitis, or bone lesion.

Conus medullaris and cauda equina: Conus extends to the L1 level.
Conus and cauda equina appear normal.

Paraspinal and other soft tissues: No acute paraspinal abnormality.

Disc levels:

Disc spaces: Degenerative disease with disc height loss at L2-3.
Disc desiccation at L4-5 and L5-S1. left renal cyst.

T12-L1: No significant disc bulge. No evidence of neural foraminal
stenosis. No central canal stenosis.

L1-L2: Minimal broad-based disc bulge. Moderate bilateral facet
arthropathy. No evidence of neural foraminal stenosis. No central
canal stenosis.

L2-L3: Mild broad-based disc bulge. Moderate bilateral facet
arthropathy. No evidence of neural foraminal stenosis. No central
canal stenosis.

L3-L4: Broad-based disc bulge. Moderate bilateral facet arthropathy.
Bilateral lateral recess narrowing. No evidence of neural foraminal
stenosis. No central canal stenosis.

L4-L5: Broad-based disc bulge. Moderate bilateral facet arthropathy.
Mild bilateral foraminal narrowing. No central canal stenosis.

L5-S1: Minimal broad-based disc bulge. Moderate bilateral facet
arthropathy. No evidence of neural foraminal stenosis. No central
canal stenosis.
IMPRESSION: 1.  No acute osseous injury of the lumbar spine.
2. Lumbar spine spondylosis as described above.

## 2020-09-28 ENCOUNTER — Other Ambulatory Visit: Payer: Self-pay | Admitting: Physician Assistant

## 2020-09-28 DIAGNOSIS — M25512 Pain in left shoulder: Secondary | ICD-10-CM | POA: Diagnosis not present

## 2020-09-28 DIAGNOSIS — M25561 Pain in right knee: Secondary | ICD-10-CM | POA: Diagnosis not present

## 2020-09-28 DIAGNOSIS — M25562 Pain in left knee: Secondary | ICD-10-CM | POA: Diagnosis not present

## 2020-09-29 ENCOUNTER — Other Ambulatory Visit: Payer: Self-pay | Admitting: Physician Assistant

## 2020-09-29 DIAGNOSIS — M25512 Pain in left shoulder: Secondary | ICD-10-CM

## 2020-10-03 ENCOUNTER — Other Ambulatory Visit: Payer: Self-pay | Admitting: *Deleted

## 2020-10-03 MED ORDER — LANTUS SOLOSTAR 100 UNIT/ML ~~LOC~~ SOPN: PEN_INJECTOR | SUBCUTANEOUS | 3 refills | Status: AC

## 2020-10-03 NOTE — Telephone Encounter (Signed)
[facility] requested refill.  ? ?

## 2020-10-20 ENCOUNTER — Other Ambulatory Visit: Payer: Self-pay

## 2020-10-20 ENCOUNTER — Ambulatory Visit
Admission: RE | Admit: 2020-10-20 | Discharge: 2020-10-20 | Disposition: A | Discharge status: Home / Self Care | Payer: Medicare Other | Source: Ambulatory Visit | Attending: Physician Assistant | Admitting: Physician Assistant

## 2020-10-20 DIAGNOSIS — M75122 Complete rotator cuff tear or rupture of left shoulder, not specified as traumatic: Secondary | ICD-10-CM | POA: Diagnosis not present

## 2020-10-20 DIAGNOSIS — M19012 Primary osteoarthritis, left shoulder: Secondary | ICD-10-CM | POA: Diagnosis not present

## 2020-10-20 DIAGNOSIS — M25512 Pain in left shoulder: Secondary | ICD-10-CM

## 2020-10-20 DIAGNOSIS — M25412 Effusion, left shoulder: Secondary | ICD-10-CM | POA: Diagnosis not present

## 2020-10-27 DIAGNOSIS — Z23 Encounter for immunization: Secondary | ICD-10-CM | POA: Diagnosis not present

## 2020-11-01 DIAGNOSIS — M19112 Post-traumatic osteoarthritis, left shoulder: Secondary | ICD-10-CM | POA: Diagnosis not present

## 2020-11-01 DIAGNOSIS — M25512 Pain in left shoulder: Secondary | ICD-10-CM | POA: Diagnosis not present

## 2020-11-08 DIAGNOSIS — Z23 Encounter for immunization: Secondary | ICD-10-CM | POA: Diagnosis not present

## 2020-12-01 ENCOUNTER — Encounter (INDEPENDENT_AMBULATORY_CARE_PROVIDER_SITE_OTHER): Payer: Medicare Other | Admitting: Ophthalmology

## 2020-12-02 ENCOUNTER — Encounter (INDEPENDENT_AMBULATORY_CARE_PROVIDER_SITE_OTHER): Payer: Medicare Other | Admitting: Ophthalmology

## 2020-12-13 ENCOUNTER — Encounter (INDEPENDENT_AMBULATORY_CARE_PROVIDER_SITE_OTHER): Payer: Medicare Other | Admitting: Ophthalmology

## 2020-12-13 ENCOUNTER — Other Ambulatory Visit: Payer: Self-pay

## 2020-12-13 DIAGNOSIS — H43813 Vitreous degeneration, bilateral: Secondary | ICD-10-CM

## 2020-12-13 DIAGNOSIS — I10 Essential (primary) hypertension: Secondary | ICD-10-CM

## 2020-12-13 DIAGNOSIS — H35033 Hypertensive retinopathy, bilateral: Secondary | ICD-10-CM | POA: Diagnosis not present

## 2020-12-13 DIAGNOSIS — H338 Other retinal detachments: Secondary | ICD-10-CM | POA: Diagnosis not present

## 2020-12-13 DIAGNOSIS — H353231 Exudative age-related macular degeneration, bilateral, with active choroidal neovascularization: Secondary | ICD-10-CM

## 2020-12-19 ENCOUNTER — Other Ambulatory Visit: Payer: Self-pay

## 2020-12-19 MED ORDER — TAMSULOSIN HCL 0.4 MG PO CAPS: ORAL_CAPSULE | ORAL | 2 refills | Status: DC

## 2020-12-19 NOTE — Telephone Encounter (Signed)
High risk or very high risk warning populated when attempting to refill medication. RX request sent to PCP for review and approval if warranted.   Dr.Miller is out of office, rx request sent to covering provider

## 2020-12-28 DIAGNOSIS — Z8546 Personal history of malignant neoplasm of prostate: Secondary | ICD-10-CM | POA: Diagnosis not present

## 2020-12-29 DIAGNOSIS — M170 Bilateral primary osteoarthritis of knee: Secondary | ICD-10-CM | POA: Diagnosis not present

## 2021-01-17 ENCOUNTER — Other Ambulatory Visit: Payer: Self-pay

## 2021-01-17 ENCOUNTER — Encounter: Payer: Self-pay | Admitting: Family Medicine

## 2021-01-17 ENCOUNTER — Ambulatory Visit (INDEPENDENT_AMBULATORY_CARE_PROVIDER_SITE_OTHER): Payer: Medicare Other | Admitting: Family Medicine

## 2021-01-17 VITALS — BP 132/70 | HR 86 | Temp 96.8°F | Ht 66.0 in | Wt 159.0 lb

## 2021-01-17 DIAGNOSIS — E785 Hyperlipidemia, unspecified: Secondary | ICD-10-CM

## 2021-01-17 DIAGNOSIS — E1151 Type 2 diabetes mellitus with diabetic peripheral angiopathy without gangrene: Secondary | ICD-10-CM

## 2021-01-17 DIAGNOSIS — R413 Other amnesia: Secondary | ICD-10-CM

## 2021-01-17 DIAGNOSIS — M170 Bilateral primary osteoarthritis of knee: Secondary | ICD-10-CM | POA: Diagnosis not present

## 2021-01-17 DIAGNOSIS — E1142 Type 2 diabetes mellitus with diabetic polyneuropathy: Secondary | ICD-10-CM | POA: Diagnosis not present

## 2021-01-17 DIAGNOSIS — M1712 Unilateral primary osteoarthritis, left knee: Secondary | ICD-10-CM

## 2021-01-17 NOTE — Progress Notes (Signed)
Provider:  Alain Honey, MD  Careteam: Patient Care Team: [name], MD as PCP - General (Family Medicine) Netta Cedars, MD as Consulting Physician (Orthopedic Surgery) Jasmine Awe, MD as Referring Physician (Ophthalmology) Rana Snare, MD (Inactive) as Consulting Physician (Urology)  PLACE OF SERVICE:  [place]  Advanced Directive information    Allergies  Allergen Reactions   Aspirin Other (See Comments)    Bothers stomach, thins blood    Atorvastatin Other (See Comments)    Muscular pain and weakness   Prednisone Other (See Comments)    Upset stomach, can take by shot not by mouth   Simvastatin Other (See Comments)    Muscular pain and weakness   Sulfa Antibiotics Other (See Comments)    Upset stomach    Other Other (See Comments)   Latex Rash   Metformin Hcl Nausea Only   Voltaren [Diclofenac Sodium] Other (See Comments)    Unknown    Chief Complaint  Patient presents with   Medical Management of Chronic Issues    Patient presents today for a 4 month follow-up.   Quality Metric Gaps    Eye exam, urine microalbumin, zoster, flu, COVID booster     HPI: Patient is a 87 y.o. male .  58-month follow-up for diabetes, neuropathy,.  Continues to have symptoms of degenerative arthritis primarily involving knees and left shoulder.  Received steroid injections per orthopedics.  This appears to help his knees but not shoulder.  By his report MRI done recently shows markedly deteriorated tendons about the shoulder which suggest to me rotator cuff pathology. Wife reports this morning and patient confirms that he is having some short-term memory problems.  Review of Systems:  Review of Systems  Constitutional: Negative.   HENT: Negative.    Eyes:  Positive for blurred vision.  Respiratory: Negative.    Cardiovascular: Negative.   Genitourinary:  Positive for frequency.  Musculoskeletal:  Positive for joint pain.  Psychiatric/Behavioral:  Negative.    All other systems reviewed and are negative.  Past Medical History:  Diagnosis Date   Allergic rhinitis, cause unspecified    Carpal tunnel syndrome    Cervical spondylosis without myelopathy    Cervical spondylosis without myelopathy    Cervicalgia    Diverticulosis of colon (without mention of hemorrhage)    Esophageal reflux    Hypertrophy of prostate with urinary obstruction and other lower urinary tract symptoms (LUTS)    Insomnia, unspecified    Internal hemorrhoids without mention of complication    Irritable bowel syndrome    Macular degeneration (senile) of retina, unspecified    Malignant neoplasm of prostate (HCC)    Neurogenic bladder, NOS    Neuropathy    Nonspecific (abnormal) findings on radiological and other examination of gastrointestinal tract    Orthostatic hypotension    Osteoarthrosis, unspecified whether generalized or localized, unspecified site    Other and unspecified hyperlipidemia    Other malaise and fatigue    Other specified disorder of rectum and anus    Radiation Proctitus   Paroxysmal supraventricular tachycardia (HCC)    Restless legs syndrome (RLS)    Retinal detachment with retinal defect, unspecified    Right bundle branch block    Incomplete   Spermatocele    Right   Trigger finger (acquired)    Type II or unspecified type diabetes mellitus without mention of complication, not stated as uncontrolled    Type II or unspecified type diabetes mellitus without mention of  complication, uncontrolled    Unspecified essential hypertension    patient denies, states he has a "fast heart rate, but no high BP"   Vitamin D deficiency    Past Surgical History:  Procedure Laterality Date   Angiolipoma  1980   Right arm   APPENDECTOMY  1951   CHOLECYSTECTOMY  01/1994   COLONOSCOPY  04/11/2010   COLONOSCOPY W/ POLYPECTOMY  2005   Removed 2 (two) polyps   DUPUYTREN CONTRACTURE RELEASE Bilateral 1989   EYE SURGERY  2006   Left   EYE  SURGERY  2006   Retina reattachment, right   EYE SURGERY  02/1994   Cataract surgery, right   EYE SURGERY  01/1995   Cataract surgery, left   KNEE SURGERY  1979   PARATHYROIDECTOMY Left 12/27/2016   Procedure: LEFT INFERIOR PARATHYROIDECTOMY;  Surgeon: Armandina Gemma, MD;  Location: [facility];  Service: General;  Laterality: Left;   POLYPECTOMY  1955   Vocal cord   RETINAL DETACHMENT SURGERY Right 2005   SHOULDER ARTHROSCOPY Right 03/22/15   [facility]   TENDON RELEASE  01/1997   seven fingers   TRANSURETHRAL RESECTION OF PROSTATE  08/1989   VITRECTOMY Left 04/23/2013   [facility]   Social History:   reports that he quit smoking about 72 years ago. His smoking use included cigarettes. He has never used smokeless tobacco. He reports that he does not drink alcohol and does not use drugs.  Family History  Problem Relation Age of Onset   Depression Mother    Diabetes Mother    Mental illness Mother        OCD   Cerebral aneurysm Father        age 45   Hypertension Sister    Diabetes Daughter    Hyperlipidemia Daughter    Breast cancer Daughter    Breast cancer Daughter    Hyperparathyroidism Neg Hx     Medications: Patient's Medications  New Prescriptions   No medications on file  Previous Medications   ACETAMINOPHEN (TYLENOL) 500 MG TABLET    Take 1,000 mg by mouth 3 (three) times daily.   ALBUTEROL (VENTOLIN HFA) 108 (90 BASE) MCG/ACT INHALER    Inhale 2 puffs into the lungs every 6 (six) hours as needed for wheezing or shortness of breath.   CHOLECALCIFEROL (VITAMIN D) 400 UNITS TABS TABLET    Take 400 Units by mouth daily.   DOCUSATE SODIUM (COLACE) 100 MG CAPSULE    Take 100 mg by mouth daily as needed.    FAMOTIDINE (PEPCID AC) 10 MG CHEWABLE TABLET    Chew 10 mg by mouth daily as needed for heartburn.   GABAPENTIN (NEURONTIN) 300 MG CAPSULE    Take 1 capsule (300 mg total) by mouth 3 (three) times daily.   HUMALOG KWIKPEN 200 UNIT/ML KWIKPEN    INJECT SUBCUTANEOUSLY 6   UNITS TWICE DAILY AFTER  MEALS   INSULIN GLARGINE (LANTUS SOLOSTAR) 100 UNIT/ML SOLOSTAR PEN    Inject Subcutaneously 46 units in the morning.   INSULIN PEN NEEDLE (B-D ULTRAFINE III SHORT PEN) 31G X 8 MM MISC    Use three times daily for insulin Injections. Dx:E11.51   LOPERAMIDE (IMODIUM) 1 MG/5ML SOLUTION    Take 1 mg by mouth as needed for diarrhea or loose stools.   LORATADINE (CLARITIN) 10 MG TABLET    Take 10 mg by mouth daily as needed.    METOPROLOL TARTRATE (LOPRESSOR) 25 MG TABLET    Take 0.5 tablets (  12.5 mg total) by mouth 2 (two) times daily.   MUPIROCIN OINTMENT (BACTROBAN) 2 %    One application to affected area on right leg.   NITROGLYCERIN (NITRODUR - DOSED IN MG/24 HR) 0.2 MG/HR PATCH    Place 1 patch (0.2 mg total) onto the skin daily. For Shoulder pain. Remove old patch.   PROBIOTIC PRODUCT (PROBIOTIC-10) CHEW    Chew 1 each by mouth daily.    TAMSULOSIN (FLOMAX) 0.4 MG CAPS CAPSULE    TAKE 1 CAPSULE BY MOUTH  DAILY FOR PROSTATE   TROLAMINE SALICYLATE (ASPERCREME) 10 % CREAM    Apply 1 application topically as needed for muscle pain.  Modified Medications   No medications on file  Discontinued Medications   No medications on file    Physical Exam:  Vitals:   01/17/21 0941  BP: 132/70  Pulse: 86  Temp: (!) 96.8 F (36 C)  SpO2: 97%  Weight: 159 lb (72.1 kg)  Height: 5\' 6"  (1.676 m)   Body mass index is 25.66 kg/m. Wt Readings from Last 3 Encounters:  01/17/21 159 lb (72.1 kg)  09/20/20 159 lb (72.1 kg)  06/07/20 155 lb 3.2 oz (70.4 kg)    Physical Exam Vitals and nursing note reviewed.  Constitutional:      Appearance: Normal appearance.  Cardiovascular:     Rate and Rhythm: Normal rate and regular rhythm.  Pulmonary:     Effort: Pulmonary effort is normal.     Breath sounds: Normal breath sounds.  Skin:    General: Skin is warm.  Neurological:     General: No focal deficit present.     Mental Status: He is alert and oriented to person, place, and  time.     Comments: Cannot recall 3 words at 5 minutes    Labs reviewed: Basic Metabolic Panel: Recent Labs    05/02/20 0817 09/20/20 1122  NA 142 141  K 4.0 4.4  CL 107 107  CO2 27 27  GLUCOSE 116* 89  BUN 20 16  CREATININE 0.96 0.93  CALCIUM 9.3 9.6   Liver Function Tests: Recent Labs    09/20/20 1122  AST 14  ALT 11  BILITOT 0.5  PROT 6.5   No results for input(s): LIPASE, AMYLASE in the last 8760 hours. No results for input(s): AMMONIA in the last 8760 hours. CBC: No results for input(s): WBC, NEUTROABS, HGB, HCT, MCV, PLT in the last 8760 hours. Lipid Panel: Recent Labs    09/20/20 1122  CHOL 245*  HDL 40  LDLCALC 154*  TRIG 334*  CHOLHDL 6.1*   TSH: No results for input(s): TSH in the last 8760 hours. A1C: Lab Results  Component Value Date   HGBA1C 7.1 (H) 09/20/2020     Assessment/Plan  1. DM (diabetes mellitus), type 2 with peripheral vascular complications (HCC) Last A1c was good at 7.1.  He continues on basal as well as rapid acting insulin  2. Diabetic polyneuropathy associated with type 2 diabetes mellitus ([place]) Continue with gabapentin.  This was initiated by neurology when he began having neuropathic symptoms involving left upper extremity  3. Hyperlipidemia, unspecified hyperlipidemia type Lipids were last assessed in August and LDL was not at goal at 154  4. Primary osteoarthritis of both knees Injections per orthopedics that help temporarily    6. Memory change Wife and patient have noticed decline in memory.  He is unable to perform usual tasks like reading and crossword puzzles due to low vision.  Alain Honey, MD [facility] [phone]

## 2021-01-18 LAB — HEMOGLOBIN A1C
Hgb A1c MFr Bld: 8.7 % of total Hgb — ABNORMAL HIGH (ref <5.7)
Mean Plasma Glucose: 203 mg/dL
eAG (mmol/L): 11.2 mmol/L

## 2021-02-06 ENCOUNTER — Other Ambulatory Visit: Payer: Self-pay | Admitting: *Deleted

## 2021-02-06 DIAGNOSIS — E1151 Type 2 diabetes mellitus with diabetic peripheral angiopathy without gangrene: Secondary | ICD-10-CM

## 2021-02-06 MED ORDER — BD PEN NEEDLE SHORT U/F 31G X 8 MM MISC: 11 refills | Status: AC

## 2021-02-06 NOTE — Telephone Encounter (Signed)
[facility] requested refill.  ? ?

## 2021-02-27 ENCOUNTER — Encounter: Payer: Self-pay | Admitting: Family

## 2021-02-28 ENCOUNTER — Other Ambulatory Visit: Payer: Self-pay

## 2021-02-28 ENCOUNTER — Ambulatory Visit (INDEPENDENT_AMBULATORY_CARE_PROVIDER_SITE_OTHER): Payer: Medicare Other | Admitting: Family

## 2021-02-28 ENCOUNTER — Encounter: Payer: Self-pay | Admitting: Family

## 2021-02-28 VITALS — BP 128/80 | HR 99 | Temp 97.8°F | Resp 16 | Ht 66.0 in | Wt 158.8 lb

## 2021-02-28 DIAGNOSIS — Z0000 Encounter for general adult medical examination without abnormal findings: Secondary | ICD-10-CM

## 2021-02-28 DIAGNOSIS — E1151 Type 2 diabetes mellitus with diabetic peripheral angiopathy without gangrene: Secondary | ICD-10-CM | POA: Diagnosis not present

## 2021-02-28 NOTE — Patient Instructions (Signed)
Micheal [name] , Thank you for taking time to come for your Medicare Wellness Visit. I appreciate your ongoing commitment to your health goals. Please review the following plan we discussed and let me know if I can assist you in the future.   Screening recommendations/referrals: Colonoscopy N/A Recommended yearly ophthalmology/optometry visit for glaucoma screening and checkup Recommended yearly dental visit for hygiene and checkup  Vaccinations: Influenza vaccine Up to date  Pneumococcal vaccine : Up to date  Tdap vaccine : Up to date Shingles vaccine : Please get vaccine at your pharmacy     Advanced directives: yes   Conditions/risks identified: Advance age male > 73 yrs ,Type 2 DM ,male Gender ,Hypertension   Next appointment: 1 year   Preventive Care 75 Years and Older, Male Preventive care refers to lifestyle choices and visits with your health care provider that can promote health and wellness. What does preventive care include? A yearly physical exam. This is also called an annual well check. Dental exams once or twice a year. Routine eye exams. Ask your health care provider how often you should have your eyes checked. Personal lifestyle choices, including: Daily care of your teeth and gums. Regular physical activity. Eating a healthy diet. Avoiding tobacco and drug use. Limiting alcohol use. Practicing safe sex. Taking low doses of aspirin every day. Taking vitamin and mineral supplements as recommended by your health care provider. What happens during an annual well check? The services and screenings done by your health care provider during your annual well check will depend on your age, overall health, lifestyle risk factors, and family history of disease. Counseling  Your health care provider may ask you questions about your: Alcohol use. Tobacco use. Drug use. Emotional well-being. Home and relationship well-being. Sexual activity. Eating habits. History of  falls. Memory and ability to understand (cognition). Work and work Statistician. Screening  You may have the following tests or measurements: Height, weight, and BMI. Blood pressure. Lipid and cholesterol levels. These may be checked every 5 years, or more frequently if you are over 61 years old. Skin check. Lung cancer screening. You may have this screening every year starting at age 71 if you have a 30-pack-year history of smoking and currently smoke or have quit within the past 15 years. Fecal occult blood test (FOBT) of the stool. You may have this test every year starting at age 83. Flexible sigmoidoscopy or colonoscopy. You may have a sigmoidoscopy every 5 years or a colonoscopy every 10 years starting at age 45. Prostate cancer screening. Recommendations will vary depending on your family history and other risks. Hepatitis C blood test. Hepatitis B blood test. Sexually transmitted disease (STD) testing. Diabetes screening. This is done by checking your blood sugar (glucose) after you have not eaten for a while (fasting). You may have this done every 1-3 years. Abdominal aortic aneurysm (AAA) screening. You may need this if you are a current or former smoker. Osteoporosis. You may be screened starting at age 61 if you are at high risk. Talk with your health care provider about your test results, treatment options, and if necessary, the need for more tests. Vaccines  Your health care provider may recommend certain vaccines, such as: Influenza vaccine. This is recommended every year. Tetanus, diphtheria, and acellular pertussis (Tdap, Td) vaccine. You may need a Td booster every 10 years. Zoster vaccine. You may need this after age 86. Pneumococcal 13-valent conjugate (PCV13) vaccine. One dose is recommended after age 103. Pneumococcal polysaccharide (PPSV23)  vaccine. One dose is recommended after age 46. Talk to your health care provider about which screenings and vaccines you need and  how often you need them. This information is not intended to replace advice given to you by your health care provider. Make sure you discuss any questions you have with your health care provider. Document Released: 02/04/2015 Document Revised: 09/28/2015 Document Reviewed: 11/09/2014 Elsevier Interactive Patient Education  2017 [place] Prevention in the Home Falls can cause injuries. They can happen to people of all ages. There are many things you can do to make your home safe and to help prevent falls. What can I do on the outside of my home? Regularly fix the edges of walkways and driveways and fix any cracks. Remove anything that might make you trip as you walk through a door, such as a raised step or threshold. Trim any bushes or trees on the path to your home. Use bright outdoor lighting. Clear any walking paths of anything that might make someone trip, such as rocks or tools. Regularly check to see if handrails are loose or broken. Make sure that both sides of any steps have handrails. Any raised decks and porches should have guardrails on the edges. Have any leaves, snow, or ice cleared regularly. Use sand or salt on walking paths during winter. Clean up any spills in your garage right away. This includes oil or grease spills. What can I do in the bathroom? Use night lights. Install grab bars by the toilet and in the tub and shower. Do not use towel bars as grab bars. Use non-skid mats or decals in the tub or shower. If you need to sit down in the shower, use a plastic, non-slip stool. Keep the floor dry. Clean up any water that spills on the floor as soon as it happens. Remove soap buildup in the tub or shower regularly. Attach bath mats securely with double-sided non-slip rug tape. Do not have throw rugs and other things on the floor that can make you trip. What can I do in the bedroom? Use night lights. Make sure that you have a light by your bed that is easy to  reach. Do not use any sheets or blankets that are too big for your bed. They should not hang down onto the floor. Have a firm chair that has side arms. You can use this for support while you get dressed. Do not have throw rugs and other things on the floor that can make you trip. What can I do in the kitchen? Clean up any spills right away. Avoid walking on wet floors. Keep items that you use a lot in easy-to-reach places. If you need to reach something above you, use a strong step stool that has a grab bar. Keep electrical cords out of the way. Do not use floor polish or wax that makes floors slippery. If you must use wax, use non-skid floor wax. Do not have throw rugs and other things on the floor that can make you trip. What can I do with my stairs? Do not leave any items on the stairs. Make sure that there are handrails on both sides of the stairs and use them. Fix handrails that are broken or loose. Make sure that handrails are as long as the stairways. Check any carpeting to make sure that it is firmly attached to the stairs. Fix any carpet that is loose or worn. Avoid having throw rugs at the top or bottom  of the stairs. If you do have throw rugs, attach them to the floor with carpet tape. Make sure that you have a light switch at the top of the stairs and the bottom of the stairs. If you do not have them, ask someone to add them for you. What else can I do to help prevent falls? Wear shoes that: Do not have high heels. Have rubber bottoms. Are comfortable and fit you well. Are closed at the toe. Do not wear sandals. If you use a stepladder: Make sure that it is fully opened. Do not climb a closed stepladder. Make sure that both sides of the stepladder are locked into place. Ask someone to hold it for you, if possible. Clearly mark and make sure that you can see: Any grab bars or handrails. First and last steps. Where the edge of each step is. Use tools that help you move  around (mobility aids) if they are needed. These include: Canes. Walkers. Scooters. Crutches. Turn on the lights when you go into a dark area. Replace any light bulbs as soon as they burn out. Set up your furniture so you have a clear path. Avoid moving your furniture around. If any of your floors are uneven, fix them. If there are any pets around you, be aware of where they are. Review your medicines with your doctor. Some medicines can make you feel dizzy. This can increase your chance of falling. Ask your doctor what other things that you can do to help prevent falls. This information is not intended to replace advice given to you by your health care provider. Make sure you discuss any questions you have with your health care provider. Document Released: 11/04/2008 Document Revised: 06/16/2015 Document Reviewed: 02/12/2014 Elsevier Interactive Patient Education  2017 [employer].

## 2021-02-28 NOTE — Progress Notes (Signed)
Subjective:   Micheal [name] is a 88 y.o. male who presents for Medicare Annual/Subsequent preventive examination.  Review of Systems     Cardiac Risk Factors include: advanced age (>66men, >61 women);diabetes mellitus;male gender;hypertension     Objective:    Today's Vitals   02/28/21 1029 02/28/21 1118  BP: 128/80   Pulse: 99   Resp: 16   Temp: 97.8 F (36.6 C)   SpO2: 97%   Weight: 158 lb 12.8 oz (72 kg)   Height: 5\' 6"  (1.676 m)   PainSc:  7    Body mass index is 25.63 kg/m.  Advanced Directives 02/27/2021 05/18/2020 01/04/2020 12/07/2019 10/26/2019 09/15/2019 09/07/2019  Does Patient Have a Medical Advance Directive? Yes Yes Yes Yes Yes Yes Yes  Type of Advance Directive Living will Living will [place];Living will;Out of facility DNR (pink MOST or yellow form) Healthcare Power of [place] of facility DNR (pink MOST or yellow form) Living will;Out of facility DNR (pink MOST or yellow form) Out of facility DNR (pink MOST or yellow form)  Does patient want to make changes to medical advance directive? No - Patient declined No - Patient declined No - Patient declined No - Patient declined No - Patient declined No - Patient declined No - Patient declined  Copy of [place] in Chart? - - Yes - validated most recent copy scanned in chart (See row information) Yes - validated most recent copy scanned in chart (See row information) - - -  Would patient like information on creating a medical advance directive? - - - - - - -  Pre-existing out of facility DNR order (yellow form or pink MOST form) - - Yellow form placed in chart (order not valid for inpatient use) - Pink MOST/Yellow Form most recent copy in chart - Physician notified to receive inpatient order Yellow form placed in chart (order not valid for inpatient use) -    Current Medications (verified) Outpatient Encounter Medications as of 02/28/2021  Medication Sig   acetaminophen  (TYLENOL) 500 MG tablet Take 1,000 mg by mouth 3 (three) times daily.   albuterol (VENTOLIN HFA) 108 (90 Base) MCG/ACT inhaler Inhale 2 puffs into the lungs every 6 (six) hours as needed for wheezing or shortness of breath.   cholecalciferol (VITAMIN D) 400 units TABS tablet Take 400 Units by mouth daily.   docusate sodium (COLACE) 100 MG capsule Take 100 mg by mouth daily as needed.    famotidine (PEPCID AC) 10 MG chewable tablet Chew 10 mg by mouth daily as needed for heartburn.   gabapentin (NEURONTIN) 300 MG capsule Take 1 capsule (300 mg total) by mouth 3 (three) times daily.   HUMALOG KWIKPEN 200 UNIT/ML KwikPen INJECT SUBCUTANEOUSLY 6  UNITS TWICE DAILY AFTER  MEALS   insulin glargine (LANTUS SOLOSTAR) 100 UNIT/ML Solostar Pen Inject Subcutaneously 46 units in the morning.   Insulin Pen Needle (B-D ULTRAFINE III SHORT PEN) 31G X 8 MM MISC Use three times daily for insulin Injections. Dx:E11.51   loperamide (IMODIUM) 1 MG/5ML solution Take 1 mg by mouth as needed for diarrhea or loose stools.   loratadine (CLARITIN) 10 MG tablet Take 10 mg by mouth daily as needed.    metoprolol tartrate (LOPRESSOR) 25 MG tablet Take 0.5 tablets (12.5 mg total) by mouth 2 (two) times daily.   mupirocin ointment (BACTROBAN) 2 % One application to affected area on right leg.   nitroGLYCERIN (NITRODUR - DOSED IN MG/24 HR)  0.2 mg/hr patch Place 1 patch (0.2 mg total) onto the skin daily. For Shoulder pain. Remove old patch.   Probiotic Product (PROBIOTIC-10) CHEW Chew 1 each by mouth daily.    tamsulosin (FLOMAX) 0.4 MG CAPS capsule TAKE 1 CAPSULE BY MOUTH  DAILY FOR PROSTATE   trolamine salicylate (ASPERCREME) 10 % cream Apply 1 application topically as needed for muscle pain.   No facility-administered encounter medications on file as of 02/28/2021.    Allergies (verified) Aspirin, Atorvastatin, Prednisone, Simvastatin, Sulfa antibiotics, Other, Latex, Metformin hcl, and Voltaren [diclofenac sodium]    History: Past Medical History:  Diagnosis Date   Allergic rhinitis, cause unspecified    Carpal tunnel syndrome    Cervical spondylosis without myelopathy    Cervical spondylosis without myelopathy    Cervicalgia    Diverticulosis of colon (without mention of hemorrhage)    Esophageal reflux    Hypertrophy of prostate with urinary obstruction and other lower urinary tract symptoms (LUTS)    Insomnia, unspecified    Internal hemorrhoids without mention of complication    Irritable bowel syndrome    Macular degeneration (senile) of retina, unspecified    Malignant neoplasm of prostate (HCC)    Neurogenic bladder, NOS    Neuropathy    Nonspecific (abnormal) findings on radiological and other examination of gastrointestinal tract    Orthostatic hypotension    Osteoarthrosis, unspecified whether generalized or localized, unspecified site    Other and unspecified hyperlipidemia    Other malaise and fatigue    Other specified disorder of rectum and anus    Radiation Proctitus   Paroxysmal supraventricular tachycardia (HCC)    Restless legs syndrome (RLS)    Retinal detachment with retinal defect, unspecified    Right bundle branch block    Incomplete   Spermatocele    Right   Trigger finger (acquired)    Type II or unspecified type diabetes mellitus without mention of complication, not stated as uncontrolled    Type II or unspecified type diabetes mellitus without mention of complication, uncontrolled    Unspecified essential hypertension    patient denies, states he has a "fast heart rate, but no high BP"   Vitamin D deficiency    Past Surgical History:  Procedure Laterality Date   Angiolipoma  1980   Right arm   APPENDECTOMY  1951   CHOLECYSTECTOMY  01/1994   COLONOSCOPY  04/11/2010   COLONOSCOPY W/ POLYPECTOMY  2005   Removed 2 (two) polyps   DUPUYTREN CONTRACTURE RELEASE Bilateral 1989   EYE SURGERY  2006   Left   EYE SURGERY  2006   Retina reattachment, right    EYE SURGERY  02/1994   Cataract surgery, right   EYE SURGERY  01/1995   Cataract surgery, left   KNEE SURGERY  1979   PARATHYROIDECTOMY Left 12/27/2016   Procedure: LEFT INFERIOR PARATHYROIDECTOMY;  Surgeon: Armandina Gemma, MD;  Location: [facility];  Service: General;  Laterality: Left;   POLYPECTOMY  1955   Vocal cord   RETINAL DETACHMENT SURGERY Right 2005   SHOULDER ARTHROSCOPY Right 03/22/15   [facility]   TENDON RELEASE  01/1997   seven fingers   TRANSURETHRAL RESECTION OF PROSTATE  08/1989   VITRECTOMY Left 04/23/2013   [facility]   Family History  Problem Relation Age of Onset   Depression Mother    Diabetes Mother    Mental illness Mother        OCD   Cerebral aneurysm Father  age 2   Hypertension Sister    Diabetes Daughter    Hyperlipidemia Daughter    Breast cancer Daughter    Breast cancer Daughter    Hyperparathyroidism Neg Hx    Social History   Socioeconomic History   Marital status: Married    Spouse name: Not on file   Number of children: 4   Years of education: 10th   Highest education level: Not on file  Occupational History   Occupation: Retired  Tobacco Use   Smoking status: Former    Years: 4.00    Types: Cigarettes    Quit date: 06/09/1948    Years since quitting: 72.7   Smokeless tobacco: Never   Tobacco comments:    1 pack/wk  Vaping Use   Vaping Use: Never used  Substance and Sexual Activity   Alcohol use: No    Alcohol/week: 0.0 standard drinks   Drug use: No   Sexual activity: Never  Other Topics Concern   Not on file  Social History Narrative   Married   Never smoked   Alcohol none   Exercise walks dog twice a day   Living will   Left-handed   2 cups caffeine per day      Social Determinants of Health   Financial Resource Strain: Not on file  Food Insecurity: Not on file  Transportation Needs: Not on file  Physical Activity: Not on file  Stress: Not on file  Social Connections: Not on file    Tobacco  Counseling Counseling given: Not Answered Tobacco comments: 1 pack/wk   Clinical Intake:  Pre-visit preparation completed: No  Pain : 0-10 Pain Score: 7  Pain Type: Chronic pain Pain Location: Knee (left shoulder and elbow) Pain Orientation: Right, Left (left shoulder) Pain Descriptors / Indicators: Aching Pain Frequency: Constant Pain Relieving Factors: tylenol Effect of Pain on Daily Activities: yes walking and lifting  Pain Relieving Factors: tylenol  BMI - recorded: 25.63 Nutritional Status: BMI 25 -29 Overweight Nutritional Risks: None Diabetes: Yes CBG done?: Yes CBG resulted in Enter/ Edit results?: Yes (239 at home) Did pt. bring in CBG monitor from home?: No  How often do you need to have someone help you when you read instructions, pamphlets, or other written materials from your doctor or pharmacy?: 5 - Always (vision impaired)  Diabetic?yes   Interpreter Needed?: No  Information entered by :: Shay Bartoli,FNP-C   Activities of Daily Living In your present state of health, do you have any difficulty performing the following activities: 02/28/2021  Hearing? Y  Comment wears hearing aids  Vision? Y  Comment wears eye glasses  Difficulty concentrating or making decisions? Y  Comment remembering  Walking or climbing stairs? Y  Comment bilateral knee pain  Dressing or bathing? N  Doing errands, shopping? Y  Comment wife drives  Conservation officer, nature and eating ? Y  Comment wife cooks  Using the Toilet? N  In the past six months, have you accidently leaked urine? Y  Do you have problems with loss of bowel control? N  Managing your Medications? Y  Comment wife assist  Managing your Finances? Y  Comment wife Teacher, English as a foreign language or managing your Housekeeping? Y  Comment wife assist  Some recent data might be hidden    Patient Care Team: [name], MD as PCP - General (Family Medicine) Netta Cedars, MD as Consulting Physician (Orthopedic  Surgery) Jasmine Awe, MD as Referring Physician (Ophthalmology) Rana Snare, MD (Inactive) as Consulting  Physician (Urology)  Indicate any recent Medical Services you may have received from other than [facility] providers in the past year (date may be approximate).     Assessment:   This is a routine wellness examination for [place].  Hearing/Vision screen Hearing Screening - Comments:: Some hearing concerns. Patient wears hearing aid in both ears. Vision Screening - Comments:: Some vision concerns. Patient wears prescription glasses. Patient last eye exam dated 2022.  Dietary issues and exercise activities discussed: Current Exercise Habits: Home exercise routine, Type of exercise: walking, Time (Minutes): 30, Frequency (Times/Week): 7, Weekly Exercise (Minutes/Week): 210, Intensity: Mild, Exercise limited by: orthopedic condition(s) (knee pain)   Goals Addressed               This Visit's Progress     <enter goal here> (pt-stated)   On track     Starting 02/01/16, I will maintain my current lifestyle.        Depression Screen PHQ 2/9 Scores 02/28/2021 01/17/2021 12/07/2019 09/15/2019 09/07/2019 05/21/2019 02/13/2019  PHQ - 2 Score 0 0 0 0 0 0 0    Fall Risk Fall Risk  02/27/2021 01/17/2021 09/20/2020 05/18/2020 12/07/2019  Falls in the past year? 0 0 0 0 0  Number falls in past yr: 0 0 0 0 0  Injury with Fall? 0 0 0 0 0  Risk for fall due to : No Fall Risks No Fall Risks History of fall(s) - -  Follow up Falls evaluation completed Falls evaluation completed;Education provided;Falls prevention discussed Falls evaluation completed;Education provided;Falls prevention discussed - -    FALL RISK PREVENTION PERTAINING TO THE HOME:  Any stairs in or around the home? Yes  If so, are there any without handrails? No  Home free of loose throw rugs in walkways, pet beds, electrical cords, etc? No  Adequate lighting in your home to reduce risk of falls? Yes   ASSISTIVE DEVICES  UTILIZED TO PREVENT FALLS:  Life alert? No  Use of a cane, walker or w/c? Yes  Grab bars in the bathroom? Yes  Shower chair or bench in shower? No  Elevated toilet seat or a handicapped toilet? Yes   TIMED UP AND GO:  Was the test performed? Yes .  Length of time to ambulate 10 feet: 20 sec.   Gait slow and steady with assistive device  Cognitive Function: MMSE - Mini Mental State Exam 02/28/2021 03/07/2018 03/04/2017 02/01/2016  Not completed: - - (No Data) Unable to complete  Orientation to time 4 4 4 5   Orientation to time comments year 2023, season winter, date N/A, day tuesday, month february. - - -  Orientation to Place 3 5 4 5   Orientation to Place-comments state [place], county [place], city [place], facility [address], provider seeing today "N/A". - - -  Registration 3 3 3 3   Registration-comments apple, table, penny. - - -  Attention/ Calculation 0 4 4 3   Attention/Calculation-comments patient states that he cannot spell well. - - -  Recall 2 1 0 0  Recall-comments apple, table. - - -  Language- name 2 objects 2 2 2 2   Language- repeat 1 1 1 1   Language- follow 3 step command 3 2 3  0  Language- read & follow direction 0 1 1 0  Language-read & follow direction-comments patient states that he cannot read, - - -  Write a sentence 0 1 1 0  Write a sentence-comments patient states that he cannot write well. - - -  Copy design  0 1 0 0  Copy design-comments patient states that he cannot see well. - - -  Total score 18 25 23 19      6CIT Screen 09/15/2019  What Year? 4 points  What month? 0 points  What time? 3 points  Count back from 20 0 points  Months in reverse 4 points  Repeat phrase 6 points  Total Score 17    Immunizations Immunization History  Administered Date(s) Administered   Fluad Quad(high Dose 65+) 10/26/2019   Influenza, High Dose Seasonal PF 10/19/2016, 11/01/2017, 10/16/2018, 10/27/2020   Influenza,inj,Quad PF,6+ Mos 10/29/2012, 10/28/2013    Influenza-Unspecified 10/25/2010, 11/07/2011, 10/07/2015   PFIZER(Purple Top)SARS-COV-2 Vaccination 03/22/2019, 04/15/2019, 10/28/2019, 11/07/2020   Pneumococcal Conjugate-13 05/18/2014   Pneumococcal Polysaccharide-23 01/23/2007   Td 08/23/2010   Tdap 06/29/2018    TDAP status: Up to date  Flu Vaccine status: Up to date  Pneumococcal vaccine status: Up to date  Covid-19 vaccine status: Information provided on how to obtain vaccines.   Qualifies for Shingles Vaccine? Yes   Zostavax completed No   Shingrix Completed?: No.    Education has been provided regarding the importance of this vaccine. Patient has been advised to call insurance company to determine out of pocket expense if they have not yet received this vaccine. Advised may also receive vaccine at local pharmacy or Health Dept. Verbalized acceptance and understanding.  Screening Tests Health Maintenance  Topic Date Due   Zoster Vaccines- Shingrix (1 of 2) Never done   OPHTHALMOLOGY EXAM  03/22/2020   URINE MICROALBUMIN  10/25/2020   COVID-19 Vaccine (5 - Booster for Pfizer series) 01/02/2021   FOOT EXAM  05/18/2021   HEMOGLOBIN A1C  07/18/2021   TETANUS/TDAP  06/28/2028   Pneumonia Vaccine 57+ Years old  Completed   INFLUENZA VACCINE  Completed   HPV VACCINES  Aged Out    Health Maintenance  Health Maintenance Due  Topic Date Due   Zoster Vaccines- Shingrix (1 of 2) Never done   OPHTHALMOLOGY EXAM  03/22/2020   URINE MICROALBUMIN  10/25/2020   COVID-19 Vaccine (5 - Booster for Pfizer series) 01/02/2021    Colorectal cancer screening: No longer required.   Lung Cancer Screening: (Low Dose CT Chest recommended if Age 44-80 years, 30 pack-year currently smoking OR have quit w/in 15years.) does not qualify.   Lung Cancer Screening Referral: No   Additional Screening:  Hepatitis C Screening: does not qualify; Completed No   Vision Screening: Recommended annual ophthalmology exams for early detection of  glaucoma and other disorders of the eye. Is the patient up to date with their annual eye exam?  Yes  Who is the provider or what is the name of the office in which the patient attends annual eye exams? Follow[facility]  If pt is not established with a provider, would they like to be referred to a provider to establish care? No .   Dental Screening: Recommended annual dental exams for proper oral hygiene  Community Resource Referral / Chronic Care Management: CRR required this visit?  No   CCM required this visit?  No      Plan:     I have personally reviewed and noted the following in the patients chart:   Medical and social history Use of alcohol, tobacco or illicit drugs  Current medications and supplements including opioid prescriptions. Patient is not currently taking opioid prescriptions. Functional ability and status Nutritional status Physical activity Advanced directives List of other physicians  Hospitalizations, surgeries, and ER visits in previous 12 months Vitals Screenings to include cognitive, depression, and falls Referrals and appointments  In addition, I have reviewed and discussed with patient certain preventive protocols, quality metrics, and best practice recommendations. A written personalized care plan for preventive services as well as general preventive health recommendations were provided to patient.     Sandrea Hughs, NP   02/28/2021   Nurse Notes: -Advised to get Shingles vaccine at the pharmacy

## 2021-03-01 LAB — MICROALBUMIN / CREATININE URINE RATIO
Creatinine, Urine: 38 mg/dL (ref 20–320)
Microalb Creat Ratio: 13 mcg/mg creat (ref <30)
Microalb, Ur: 0.5 mg/dL

## 2021-03-29 ENCOUNTER — Other Ambulatory Visit: Payer: Self-pay

## 2021-03-29 ENCOUNTER — Inpatient Hospital Stay (HOSPITAL_COMMUNITY)
Admission: EM | Admit: 2021-03-29 | Discharge: 2021-04-03 | DRG: 203 | Disposition: A | Discharge status: Home / Self Care | Payer: Medicare Other | Attending: Internal Medicine | Admitting: Internal Medicine

## 2021-03-29 ENCOUNTER — Encounter (HOSPITAL_COMMUNITY): Payer: Self-pay

## 2021-03-29 ENCOUNTER — Emergency Department (HOSPITAL_COMMUNITY): Payer: Medicare Other

## 2021-03-29 DIAGNOSIS — E892 Postprocedural hypoparathyroidism: Secondary | ICD-10-CM | POA: Diagnosis present

## 2021-03-29 DIAGNOSIS — E785 Hyperlipidemia, unspecified: Secondary | ICD-10-CM | POA: Diagnosis present

## 2021-03-29 DIAGNOSIS — J309 Allergic rhinitis, unspecified: Secondary | ICD-10-CM | POA: Diagnosis present

## 2021-03-29 DIAGNOSIS — N400 Enlarged prostate without lower urinary tract symptoms: Secondary | ICD-10-CM | POA: Diagnosis present

## 2021-03-29 DIAGNOSIS — J211 Acute bronchiolitis due to human metapneumovirus: Secondary | ICD-10-CM | POA: Diagnosis not present

## 2021-03-29 DIAGNOSIS — E1151 Type 2 diabetes mellitus with diabetic peripheral angiopathy without gangrene: Secondary | ICD-10-CM | POA: Diagnosis present

## 2021-03-29 DIAGNOSIS — Z888 Allergy status to other drugs, medicaments and biological substances status: Secondary | ICD-10-CM

## 2021-03-29 DIAGNOSIS — K219 Gastro-esophageal reflux disease without esophagitis: Secondary | ICD-10-CM | POA: Diagnosis present

## 2021-03-29 DIAGNOSIS — Z886 Allergy status to analgesic agent status: Secondary | ICD-10-CM

## 2021-03-29 DIAGNOSIS — Z8546 Personal history of malignant neoplasm of prostate: Secondary | ICD-10-CM | POA: Diagnosis not present

## 2021-03-29 DIAGNOSIS — Z87891 Personal history of nicotine dependence: Secondary | ICD-10-CM

## 2021-03-29 DIAGNOSIS — Z20822 Contact with and (suspected) exposure to covid-19: Secondary | ICD-10-CM | POA: Diagnosis present

## 2021-03-29 DIAGNOSIS — H3530 Unspecified macular degeneration: Secondary | ICD-10-CM | POA: Diagnosis present

## 2021-03-29 DIAGNOSIS — Z79899 Other long term (current) drug therapy: Secondary | ICD-10-CM | POA: Diagnosis not present

## 2021-03-29 DIAGNOSIS — E1165 Type 2 diabetes mellitus with hyperglycemia: Secondary | ICD-10-CM | POA: Diagnosis present

## 2021-03-29 DIAGNOSIS — Z833 Family history of diabetes mellitus: Secondary | ICD-10-CM

## 2021-03-29 DIAGNOSIS — D649 Anemia, unspecified: Secondary | ICD-10-CM | POA: Diagnosis present

## 2021-03-29 DIAGNOSIS — R6510 Systemic inflammatory response syndrome (SIRS) of non-infectious origin without acute organ dysfunction: Secondary | ICD-10-CM | POA: Diagnosis present

## 2021-03-29 DIAGNOSIS — I10 Essential (primary) hypertension: Secondary | ICD-10-CM | POA: Diagnosis present

## 2021-03-29 DIAGNOSIS — G2581 Restless legs syndrome: Secondary | ICD-10-CM | POA: Diagnosis present

## 2021-03-29 DIAGNOSIS — J189 Pneumonia, unspecified organism: Principal | ICD-10-CM

## 2021-03-29 DIAGNOSIS — R0602 Shortness of breath: Secondary | ICD-10-CM

## 2021-03-29 DIAGNOSIS — K5730 Diverticulosis of large intestine without perforation or abscess without bleeding: Secondary | ICD-10-CM | POA: Diagnosis present

## 2021-03-29 DIAGNOSIS — N319 Neuromuscular dysfunction of bladder, unspecified: Secondary | ICD-10-CM | POA: Diagnosis present

## 2021-03-29 DIAGNOSIS — Z794 Long term (current) use of insulin: Secondary | ICD-10-CM

## 2021-03-29 DIAGNOSIS — J208 Acute bronchitis due to other specified organisms: Secondary | ICD-10-CM | POA: Diagnosis present

## 2021-03-29 DIAGNOSIS — Z882 Allergy status to sulfonamides status: Secondary | ICD-10-CM | POA: Diagnosis not present

## 2021-03-29 DIAGNOSIS — Z91040 Latex allergy status: Secondary | ICD-10-CM

## 2021-03-29 DIAGNOSIS — Z8249 Family history of ischemic heart disease and other diseases of the circulatory system: Secondary | ICD-10-CM

## 2021-03-29 DIAGNOSIS — E559 Vitamin D deficiency, unspecified: Secondary | ICD-10-CM | POA: Diagnosis present

## 2021-03-29 DIAGNOSIS — I4510 Unspecified right bundle-branch block: Secondary | ICD-10-CM | POA: Diagnosis present

## 2021-03-29 LAB — CBG MONITORING, ED: Glucose-Capillary: 82 mg/dL (ref 70–99)

## 2021-03-29 LAB — COMPREHENSIVE METABOLIC PANEL
ALT: 16 U/L (ref 0–44)
AST: 19 U/L (ref 15–41)
Albumin: 4 g/dL (ref 3.5–5.0)
Alkaline Phosphatase: 68 U/L (ref 38–126)
Anion gap: 7 (ref 5–15)
BUN: 14 mg/dL (ref 8–23)
CO2: 22 mmol/L (ref 22–32)
Calcium: 8.6 mg/dL — ABNORMAL LOW (ref 8.9–10.3)
Chloride: 105 mmol/L (ref 98–111)
Creatinine, Ser: 0.96 mg/dL (ref 0.61–1.24)
GFR, Estimated: >60 mL/min (ref >60)
Glucose, Bld: 115 mg/dL — ABNORMAL HIGH (ref 70–99)
Potassium: 3.7 mmol/L (ref 3.5–5.1)
Sodium: 134 mmol/L — ABNORMAL LOW (ref 135–145)
Total Bilirubin: 0.9 mg/dL (ref 0.3–1.2)
Total Protein: 6.5 g/dL (ref 6.5–8.1)

## 2021-03-29 LAB — CBC WITH DIFFERENTIAL/PLATELET
Abs Immature Granulocytes: 0.03 10*3/uL (ref 0.00–0.07)
Basophils Absolute: 0 10*3/uL (ref 0.0–0.1)
Basophils Relative: 0 %
Eosinophils Absolute: 0.1 10*3/uL (ref 0.0–0.5)
Eosinophils Relative: 1 %
HCT: 38 % — ABNORMAL LOW (ref 39.0–52.0)
Hemoglobin: 12.8 g/dL — ABNORMAL LOW (ref 13.0–17.0)
Immature Granulocytes: 0 %
Lymphocytes Relative: 12 %
Lymphs Abs: 1.1 10*3/uL (ref 0.7–4.0)
MCH: 30.3 pg (ref 26.0–34.0)
MCHC: 33.7 g/dL (ref 30.0–36.0)
MCV: 90 fL (ref 80.0–100.0)
Monocytes Absolute: 1.2 10*3/uL — ABNORMAL HIGH (ref 0.1–1.0)
Monocytes Relative: 14 %
Neutro Abs: 6.5 10*3/uL (ref 1.7–7.7)
Neutrophils Relative %: 73 %
Platelets: 183 10*3/uL (ref 150–400)
RBC: 4.22 MIL/uL (ref 4.22–5.81)
RDW: 12.8 % (ref 11.5–15.5)
WBC: 9 10*3/uL (ref 4.0–10.5)
nRBC: 0 % (ref 0.0–0.2)

## 2021-03-29 LAB — URINALYSIS, ROUTINE W REFLEX MICROSCOPIC
Bilirubin Urine: NEGATIVE
Glucose, UA: NEGATIVE mg/dL
Hgb urine dipstick: NEGATIVE
Ketones, ur: NEGATIVE mg/dL
Leukocytes,Ua: NEGATIVE
Nitrite: NEGATIVE
Protein, ur: NEGATIVE mg/dL
Specific Gravity, Urine: 1.009 (ref 1.005–1.030)
pH: 7 (ref 5.0–8.0)

## 2021-03-29 LAB — RESP PANEL BY RT-PCR (FLU A&B, COVID) ARPGX2
Influenza A by PCR: NEGATIVE
Influenza B by PCR: NEGATIVE
SARS Coronavirus 2 by RT PCR: NEGATIVE

## 2021-03-29 LAB — MAGNESIUM: Magnesium: 2.3 mg/dL (ref 1.7–2.4)

## 2021-03-29 LAB — GLUCOSE, CAPILLARY
Glucose-Capillary: 304 mg/dL — ABNORMAL HIGH (ref 70–99)
Glucose-Capillary: 317 mg/dL — ABNORMAL HIGH (ref 70–99)

## 2021-03-29 LAB — PHOSPHORUS: Phosphorus: 3.5 mg/dL (ref 2.5–4.6)

## 2021-03-29 LAB — HEMOGLOBIN A1C
Hgb A1c MFr Bld: 7.5 % — ABNORMAL HIGH (ref 4.8–5.6)
Mean Plasma Glucose: 168.55 mg/dL

## 2021-03-29 LAB — PROCALCITONIN: Procalcitonin: <0.1 ng/mL

## 2021-03-29 MED ORDER — FAMOTIDINE 10 MG PO TABS
10.0000 mg | ORAL_TABLET | Freq: Two times a day (BID) | ORAL | Status: DC
  Administered 2021-03-29 – 2021-04-03 (×10): 10 mg via ORAL
  Filled 2021-03-29 (×11): qty 1

## 2021-03-29 MED ORDER — SODIUM CHLORIDE 0.9 % IV SOLN
1.0000 g | INTRAVENOUS | Status: DC
  Administered 2021-03-30: 16:00:00 1 g via INTRAVENOUS
  Filled 2021-03-29 (×2): qty 10

## 2021-03-29 MED ORDER — SODIUM CHLORIDE 0.9 % IV SOLN
2.0000 g | Freq: Once | INTRAVENOUS | Status: AC
  Administered 2021-03-29: 2 g via INTRAVENOUS
  Filled 2021-03-29: qty 20

## 2021-03-29 MED ORDER — SODIUM CHLORIDE 0.9 % IV BOLUS
500.0000 mL | Freq: Once | INTRAVENOUS | Status: AC
  Administered 2021-03-29: 500 mL via INTRAVENOUS

## 2021-03-29 MED ORDER — ENOXAPARIN SODIUM 40 MG/0.4ML IJ SOSY
40.0000 mg | PREFILLED_SYRINGE | INTRAMUSCULAR | Status: DC
  Administered 2021-03-29 – 2021-04-02 (×5): 40 mg via SUBCUTANEOUS
  Filled 2021-03-29 (×5): qty 0.4

## 2021-03-29 MED ORDER — ONDANSETRON HCL 4 MG PO TABS: 4.0000 mg | ORAL_TABLET | Freq: Four times a day (QID) | ORAL | Status: DC | PRN

## 2021-03-29 MED ORDER — ACETAMINOPHEN 325 MG PO TABS
650.0000 mg | ORAL_TABLET | Freq: Four times a day (QID) | ORAL | Status: DC | PRN
  Administered 2021-03-29 – 2021-04-02 (×4): 650 mg via ORAL
  Filled 2021-03-29 (×4): qty 2

## 2021-03-29 MED ORDER — LOPERAMIDE HCL 1 MG/7.5ML PO SUSP
1.0000 mg | ORAL | Status: DC | PRN
  Filled 2021-03-29: qty 7.5

## 2021-03-29 MED ORDER — IPRATROPIUM-ALBUTEROL 0.5-2.5 (3) MG/3ML IN SOLN
3.0000 mL | Freq: Three times a day (TID) | RESPIRATORY_TRACT | Status: DC
  Administered 2021-03-29 – 2021-04-03 (×15): 3 mL via RESPIRATORY_TRACT
  Filled 2021-03-29 (×15): qty 3

## 2021-03-29 MED ORDER — DM-GUAIFENESIN ER 30-600 MG PO TB12
1.0000 | ORAL_TABLET | Freq: Two times a day (BID) | ORAL | Status: DC
  Administered 2021-03-29 – 2021-04-03 (×10): 1 via ORAL
  Filled 2021-03-29 (×10): qty 1

## 2021-03-29 MED ORDER — INSULIN GLARGINE 100 UNIT/ML SOLOSTAR PEN: 46.0000 [IU] | PEN_INJECTOR | Freq: Every day | SUBCUTANEOUS | Status: DC

## 2021-03-29 MED ORDER — SODIUM CHLORIDE 0.9 % IV SOLN
500.0000 mg | INTRAVENOUS | Status: DC
  Administered 2021-03-30: 15:00:00 500 mg via INTRAVENOUS
  Filled 2021-03-29 (×2): qty 5

## 2021-03-29 MED ORDER — ALBUTEROL SULFATE (2.5 MG/3ML) 0.083% IN NEBU
2.5000 mg | INHALATION_SOLUTION | Freq: Once | RESPIRATORY_TRACT | Status: DC
  Filled 2021-03-29: qty 3

## 2021-03-29 MED ORDER — ONDANSETRON HCL 4 MG/2ML IJ SOLN: 4.0000 mg | Freq: Four times a day (QID) | INTRAMUSCULAR | Status: DC | PRN

## 2021-03-29 MED ORDER — GABAPENTIN 300 MG PO CAPS
300.0000 mg | ORAL_CAPSULE | Freq: Three times a day (TID) | ORAL | Status: DC
  Administered 2021-03-29 – 2021-04-03 (×14): 300 mg via ORAL
  Filled 2021-03-29 (×14): qty 1

## 2021-03-29 MED ORDER — ACETAMINOPHEN 650 MG RE SUPP: 650.0000 mg | Freq: Four times a day (QID) | RECTAL | Status: DC | PRN

## 2021-03-29 MED ORDER — INSULIN GLARGINE-YFGN 100 UNIT/ML ~~LOC~~ SOLN
46.0000 [IU] | Freq: Every day | SUBCUTANEOUS | Status: DC
  Administered 2021-03-30 – 2021-04-02 (×4): 46 [IU] via SUBCUTANEOUS
  Filled 2021-03-29 (×4): qty 0.46

## 2021-03-29 MED ORDER — ALBUTEROL SULFATE (2.5 MG/3ML) 0.083% IN NEBU: 2.5000 mg | INHALATION_SOLUTION | RESPIRATORY_TRACT | Status: DC | PRN

## 2021-03-29 MED ORDER — INSULIN ASPART 100 UNIT/ML IJ SOLN
5.0000 [IU] | Freq: Once | INTRAMUSCULAR | Status: AC
  Administered 2021-03-29: 5 [IU] via SUBCUTANEOUS

## 2021-03-29 MED ORDER — HUMALOG KWIKPEN 200 UNIT/ML ~~LOC~~ SOPN: PEN_INJECTOR | SUBCUTANEOUS | 3 refills | Status: DC

## 2021-03-29 MED ORDER — METHYLPREDNISOLONE SODIUM SUCC 40 MG IJ SOLR
40.0000 mg | Freq: Once | INTRAMUSCULAR | Status: AC
  Administered 2021-03-29: 40 mg via INTRAVENOUS
  Filled 2021-03-29: qty 1

## 2021-03-29 MED ORDER — MAGNESIUM SULFATE 2 GM/50ML IV SOLN
2.0000 g | Freq: Once | INTRAVENOUS | Status: AC
  Administered 2021-03-29: 2 g via INTRAVENOUS
  Filled 2021-03-29: qty 50

## 2021-03-29 MED ORDER — TAMSULOSIN HCL 0.4 MG PO CAPS
0.4000 mg | ORAL_CAPSULE | Freq: Every day | ORAL | Status: DC
  Administered 2021-03-29 – 2021-04-03 (×6): 0.4 mg via ORAL
  Filled 2021-03-29 (×6): qty 1

## 2021-03-29 MED ORDER — INSULIN ASPART 100 UNIT/ML IJ SOLN
0.0000 [IU] | Freq: Three times a day (TID) | INTRAMUSCULAR | Status: DC
  Administered 2021-03-30: 12:00:00 5 [IU] via SUBCUTANEOUS
  Administered 2021-03-30: 09:00:00 8 [IU] via SUBCUTANEOUS
  Administered 2021-03-30: 18:00:00 5 [IU] via SUBCUTANEOUS
  Administered 2021-03-31: 3 [IU] via SUBCUTANEOUS
  Administered 2021-03-31: 5 [IU] via SUBCUTANEOUS
  Administered 2021-03-31: 1 [IU] via SUBCUTANEOUS
  Administered 2021-04-01: 2 [IU] via SUBCUTANEOUS
  Administered 2021-04-01: 3 [IU] via SUBCUTANEOUS
  Administered 2021-04-02 (×3): 5 [IU] via SUBCUTANEOUS
  Administered 2021-04-03 (×2): 3 [IU] via SUBCUTANEOUS
  Filled 2021-03-29: qty 0.15

## 2021-03-29 MED ORDER — SODIUM CHLORIDE 0.9 % IV SOLN
500.0000 mg | Freq: Once | INTRAVENOUS | Status: AC
  Administered 2021-03-29: 500 mg via INTRAVENOUS
  Filled 2021-03-29: qty 5

## 2021-03-29 MED ORDER — LORATADINE 10 MG PO TABS
10.0000 mg | ORAL_TABLET | Freq: Every day | ORAL | Status: DC
  Administered 2021-03-30 – 2021-04-03 (×5): 10 mg via ORAL
  Filled 2021-03-29 (×5): qty 1

## 2021-03-29 MED ORDER — METOPROLOL TARTRATE 12.5 MG HALF TABLET
12.5000 mg | ORAL_TABLET | Freq: Two times a day (BID) | ORAL | Status: DC
  Administered 2021-03-29 – 2021-04-03 (×10): 12.5 mg via ORAL
  Filled 2021-03-29 (×10): qty 1

## 2021-03-29 NOTE — H&P (Signed)
History and Physical    Patient: Micheal [name] WIO:[id number] DOB: 23-Jun-1928 DOA: 03/29/2021 DOS: the patient was seen and examined on 03/29/2021 PCP: [name], MD  Patient coming from: Home  Chief Complaint:  Chief Complaint  Patient presents with   Fever   HPI: Micheal [name] is a 88 y.o. male with medical history significant of allergic rhinitis, carpal tunnel syndrome, cervical spondylosis, cervicalgia, GERD, diverticulosis, BPH, prostate CA, insomnia, internal hemorrhoids, IBS, macular degeneration, neurogenic bladder, neuropathy NOS, orthostatic hypotension, osteoarthrosis, hyperlipidemia, paroxysmal SVT, restless syndrome, incomplete right bundle branch block, retinal detachment, hypertension, type 2 diabetes who is coming to the emergency department with complaints of fever, sore throat, productive cough with yellowish sputum, dyspnea, wheezing, fatigue, malaise and body aches since Monday.  He denied chest pain, palpitations, diaphoresis, PND, orthopnea or pitting edema of the lower extremities.  No abdominal pain, nausea, emesis, diarrhea, constipation, melena or hematochezia.  No flank pain, dysuria or hematuria.  He has frequent urinary incontinence.  No polyuria, polydipsia, polyphagia or blurred vision.  ED course: Initial vital signs were temperature 99.2 F, pulse 98, respirations 16, BP 124/60 mmHg O2 sat 95% on room air.  The patient received ceftriaxone 2 g IVPB, azithromycin 500 mg IVPB and a 500 mL normal saline bolus.  Lab work: His urinalysis was normal.  CBC showed a white count of 9.0 with 73% neutrophils, 12% lymphocytes and 14% monocytes.  Hemoglobin was 12.8 g/dL platelets 183.  CMP was significant for a sodium of 134 mmol/L, glucose of 115 and calcium of 8.6 mg/dL.  Coronavirus and influenza PCR was negative.  Imaging: A 2 view chest radiograph study showed normal heart size and mediastinal contours.  There was aortic atherosclerosis.  Chronically  coarsening interstitial markings within the lung bases, but no focal airspace consolidation, pleural effusion or pneumothorax.  Please see images and full radiology report for further details.   Review of Systems: As mentioned in the history of present illness. All other systems reviewed and are negative. Past Medical History:  Diagnosis Date   Allergic rhinitis, cause unspecified    Carpal tunnel syndrome    Cervical spondylosis without myelopathy    Cervical spondylosis without myelopathy    Cervicalgia    Diverticulosis of colon (without mention of hemorrhage)    Esophageal reflux    Hypertrophy of prostate with urinary obstruction and other lower urinary tract symptoms (LUTS)    Insomnia, unspecified    Internal hemorrhoids without mention of complication    Irritable bowel syndrome    Macular degeneration (senile) of retina, unspecified    Malignant neoplasm of prostate (HCC)    Neurogenic bladder, NOS    Neuropathy    Nonspecific (abnormal) findings on radiological and other examination of gastrointestinal tract    Orthostatic hypotension    Osteoarthrosis, unspecified whether generalized or localized, unspecified site    Other and unspecified hyperlipidemia    Other malaise and fatigue    Other specified disorder of rectum and anus    Radiation Proctitus   Paroxysmal supraventricular tachycardia (HCC)    Restless legs syndrome (RLS)    Retinal detachment with retinal defect, unspecified    Right bundle branch block    Incomplete   Spermatocele    Right   Trigger finger (acquired)    Type II or unspecified type diabetes mellitus without mention of complication, not stated as uncontrolled    Type II or unspecified type diabetes mellitus without mention of complication, uncontrolled  Unspecified essential hypertension    patient denies, states he has a "fast heart rate, but no high BP"   Vitamin D deficiency    Past Surgical History:  Procedure Laterality Date    Angiolipoma  1980   Right arm   APPENDECTOMY  1951   CHOLECYSTECTOMY  01/1994   COLONOSCOPY  04/11/2010   COLONOSCOPY W/ POLYPECTOMY  2005   Removed 2 (two) polyps   DUPUYTREN CONTRACTURE RELEASE Bilateral 1989   EYE SURGERY  2006   Left   EYE SURGERY  2006   Retina reattachment, right   EYE SURGERY  02/1994   Cataract surgery, right   EYE SURGERY  01/1995   Cataract surgery, left   KNEE SURGERY  1979   PARATHYROIDECTOMY Left 12/27/2016   Procedure: LEFT INFERIOR PARATHYROIDECTOMY;  Surgeon: Armandina Gemma, MD;  Location: [facility];  Service: General;  Laterality: Left;   POLYPECTOMY  1955   Vocal cord   RETINAL DETACHMENT SURGERY Right 2005   SHOULDER ARTHROSCOPY Right 03/22/15   [facility]   TENDON RELEASE  01/1997   seven fingers   TRANSURETHRAL RESECTION OF PROSTATE  08/1989   VITRECTOMY Left 04/23/2013   [facility]   Social History:  reports that he quit smoking about 72 years ago. His smoking use included cigarettes. He has never used smokeless tobacco. He reports that he does not drink alcohol and does not use drugs.  Allergies  Allergen Reactions   Aspirin Other (See Comments)    Bothers stomach, thins blood    Atorvastatin Other (See Comments)    Muscular pain and weakness   Prednisone Other (See Comments)    Upset stomach, can take by shot not by mouth   Simvastatin Other (See Comments)    Muscular pain and weakness   Sulfa Antibiotics Other (See Comments)    Upset stomach    Other Other (See Comments)   Latex Rash   Metformin Hcl Nausea Only   Voltaren [Diclofenac Sodium] Other (See Comments)    Unknown    Family History  Problem Relation Age of Onset   Depression Mother    Diabetes Mother    Mental illness Mother        OCD   Cerebral aneurysm Father        age 35   Hypertension Sister    Diabetes Daughter    Hyperlipidemia Daughter    Breast cancer Daughter    Breast cancer Daughter    Hyperparathyroidism Neg Hx     Prior to Admission medications    Medication Sig Start Date End Date Taking? Authorizing Provider  acetaminophen (TYLENOL) 500 MG tablet Take 500 mg by mouth 3 (three) times daily.   Yes [provider]  albuterol (VENTOLIN HFA) 108 (90 Base) MCG/ACT inhaler Inhale 2 puffs into the lungs every 6 (six) hours as needed for wheezing or shortness of breath. 06/07/20  Yes [name], MD  azelastine (ASTELIN) 0.1 % nasal spray Place 1 spray into both nostrils 2 (two) times daily as needed for rhinitis or allergies.   Yes [provider]  cholecalciferol (VITAMIN D) 400 units TABS tablet Take 400 Units by mouth daily.   Yes [provider]  docusate sodium (COLACE) 100 MG capsule Take 100 mg by mouth daily as needed for mild constipation or moderate constipation.   Yes [provider]  famotidine (PEPCID AC) 10 MG chewable tablet Chew 10 mg by mouth daily as needed for heartburn.   Yes [provider]  gabapentin (NEURONTIN) 300 MG capsule Take 1 capsule (300 mg total) by mouth 3 (three) times daily. 06/21/20  Yes [name], MD  insulin glargine (LANTUS SOLOSTAR) 100 UNIT/ML Solostar Pen Inject Subcutaneously 46 units in the morning. 10/03/20  Yes [name], MD  insulin lispro (HUMALOG KWIKPEN) 200 UNIT/ML KwikPen INJECT SUBCUTANEOUSLY 6  UNITS TWICE DAILY AFTER  MEALS Patient taking differently: 10 Units in the morning and at bedtime. INJECT SUBCUTANEOUSLY 10  UNITS TWICE DAILY AFTER  MEALS 03/29/21  Yes [name], MD  loperamide (IMODIUM) 1 MG/5ML solution Take 1 mg by mouth as needed for diarrhea or loose stools.   Yes [provider]  loratadine (CLARITIN) 10 MG tablet Take 10 mg by mouth daily.   Yes [provider]  metoprolol tartrate (LOPRESSOR) 25 MG tablet Take 0.5 tablets (12.5 mg total) by mouth 2 (two) times daily. 05/30/20  Yes [name], MD  mupirocin ointment [facility]) 2 % One application to affected area on right leg. Patient  taking differently: Apply 1 application. topically daily as needed (rash). 02/13/19  Yes Ngetich, Dinah C, NP  Probiotic Product (PROBIOTIC-10) CHEW Chew 1 each by mouth daily.    Yes [provider]  tamsulosin (FLOMAX) 0.4 MG CAPS capsule TAKE 1 CAPSULE BY MOUTH  DAILY FOR PROSTATE Patient taking differently: 0.4 mg daily as needed (prostate). 12/19/20  Yes Lauree Chandler, NP  trolamine salicylate (ASPERCREME) 10 % cream Apply 1 application topically as needed for muscle pain.   Yes [provider]  Insulin Pen Needle (B-D ULTRAFINE III SHORT PEN) 31G X 8 MM MISC Use three times daily for insulin Injections. Dx:E11.51 02/06/21   [name], MD  nitroGLYCERIN (NITRODUR - DOSED IN MG/24 HR) 0.2 mg/hr patch Place 1 patch (0.2 mg total) onto the skin daily. For Shoulder pain. Remove old patch. Patient not taking: Reported on 03/29/2021 09/21/20   [name], MD    Physical Exam: Vitals:   03/29/21 1156 03/29/21 1345 03/29/21 1500  BP: 124/60 130/67 127/70  Pulse: 98 100 100  Resp: 16 (!) 23 (!) 24  Temp: 99.2 F (37.3 C)    TempSrc: Oral    SpO2: 95% 95% 97%  Weight: 71.1 kg    Height: '5\' 6"'$  (1.676 m)     Physical Exam Vitals and nursing note reviewed.  HENT:     Head: Normocephalic.     Mouth/Throat:     Mouth: Mucous membranes are moist.  Eyes:     Pupils: Pupils are equal, round, and reactive to light.  Neck:     Vascular: No JVD.  Cardiovascular:     Rate and Rhythm: Regular rhythm. Tachycardia present.     Heart sounds: S1 normal and S2 normal.     Comments: 106 BPM Pulmonary:     Effort: Pulmonary effort is normal. Prolonged expiration present.     Breath sounds: Examination of the right-upper field reveals rhonchi. Examination of the left-upper field reveals rhonchi. Wheezing and rhonchi present. No rales.  Abdominal:     General: There is no distension.     Palpations: Abdomen is soft.     Tenderness: There is no abdominal tenderness.   Musculoskeletal:     Cervical back: Neck supple.     Right lower leg: No edema.     Left lower leg: No edema.  Skin:    General: Skin is warm and dry.     Coloration:  Skin is not jaundiced.  Neurological:     General: No focal deficit present.     Mental Status: He is alert and oriented to person, place, and time.  Psychiatric:        Mood and Affect: Mood normal.        Behavior: Behavior normal.    Data Reviewed:  There are no new results to review at this time.  Assessment and Plan: Principal Problem:   SIRS (systemic inflammatory response syndrome) (HCC) In the setting of acute bronchitis versus incipient bronchopneumonia. Admit to telemetry/inpatient. Supplemental oxygen as needed. DuoNeb 3 times a day. Albuterol neb every 4 hours as needed. Magnesium sulfate 2 g IVPB x1. Methylprednisolone 40 mg IVP x1 dose. Continue ceftriaxone 1 g IVPB daily. Continue azithromycin 500 mg IVPB daily. Follow blood culture and sensitivity. Follow-up CBC and chemistry in the morning.  Active Problems:   Essential hypertension Continue metoprolol 12.5 mg p.o. twice daily.    Esophageal reflux Continue famotidine 10 mg p.o. twice daily.    BPH (benign prostatic hyperplasia) Resume tamsulosin 0.4 mg p.o. daily.    DM (diabetes mellitus), type 2 with peripheral vascular complications (HCC) Carbohydrate modified diet. Continue Lantus 46 units SQ daily or formulary equivalent. CBG monitoring before meals or bedtime. Check hemoglobin A1c level.    Hyperlipidemia Currently not on medical therapy. Follow-up with PCP.    Hypocalcemia Recheck calcium level in AM.    Normocytic anemia Monitor hematocrit and hemoglobin.     Advance Care Planning:   Code Status: Full Code   Consults:   Family Communication:   Severity of Illness: The appropriate patient status for this patient is INPATIENT. Inpatient status is judged to be reasonable and necessary in order to provide the  required intensity of service to ensure the patient's safety. The patient's presenting symptoms, physical exam findings, and initial radiographic and laboratory data in the context of their chronic comorbidities is felt to place them at high risk for further clinical deterioration. Furthermore, it is not anticipated that the patient will be medically stable for discharge from the hospital within 2 midnights of admission.   * I certify that at the point of admission it is my clinical judgment that the patient will require inpatient hospital care spanning beyond 2 midnights from the point of admission due to high intensity of service, high risk for further deterioration and high frequency of surveillance required.*  Author: Reubin Milan, MD 03/29/2021 3:33 PM  For on call review [email or web address].   This document was prepared using Dragon voice recognition software and may contain some unintended errors.

## 2021-03-29 NOTE — ED Triage Notes (Signed)
Pt presents with c/o fever. Pt reports he was seen at his doctor's office and they were unable to find out what was wrong with pt. Pt reports a cough and a fever. Pt reports he feels very weak.  ?

## 2021-03-29 NOTE — ED Notes (Signed)
Per UC, states patient was seen today for fever x2 days-states cough as well-chest x-ray and covid negative-coming to ED for further eval-records being faxed  ?

## 2021-03-29 NOTE — ED Provider Notes (Signed)
[facility] DEPT Provider Note   CSN: [id number] Arrival date & time: 03/29/21  1102     History  Chief Complaint  Patient presents with   Fever    Micheal [name] is a 88 y.o. male.  Patient complains of cough since Monday with fever and yellow sputum production along with general weakness and shortness of breath.  Patient has a history of diabetes  The history is provided by the patient and a relative. No language interpreter was used.  Fever Temp source:  Oral Severity:  Moderate Timing:  Constant Progression:  Worsening Chronicity:  New Relieved by:  Nothing Worsened by:  Nothing Associated symptoms: cough   Associated symptoms: no chest pain, no congestion, no diarrhea, no headaches and no rash       Home Medications Prior to Admission medications   Medication Sig Start Date End Date Taking? Authorizing Provider  acetaminophen (TYLENOL) 500 MG tablet Take 1,000 mg by mouth 3 (three) times daily.    [provider]  albuterol (VENTOLIN HFA) 108 (90 Base) MCG/ACT inhaler Inhale 2 puffs into the lungs every 6 (six) hours as needed for wheezing or shortness of breath. 06/07/20   [name], MD  cholecalciferol (VITAMIN D) 400 units TABS tablet Take 400 Units by mouth daily.    [provider]  docusate sodium (COLACE) 100 MG capsule Take 100 mg by mouth daily as needed.     [provider]  famotidine (PEPCID AC) 10 MG chewable tablet Chew 10 mg by mouth daily as needed for heartburn.    [provider]  gabapentin (NEURONTIN) 300 MG capsule Take 1 capsule (300 mg total) by mouth 3 (three) times daily. 06/21/20   [name], MD  insulin glargine (LANTUS SOLOSTAR) 100 UNIT/ML Solostar Pen Inject Subcutaneously 46 units in the morning. 10/03/20   [name], MD  insulin lispro (HUMALOG KWIKPEN) 200 UNIT/ML KwikPen INJECT SUBCUTANEOUSLY 6  UNITS TWICE DAILY AFTER  MEALS 03/29/21   [name], MD  Insulin Pen Needle (B-D ULTRAFINE III SHORT PEN) 31G X 8 MM MISC Use three times daily for insulin Injections. Dx:E11.51 02/06/21   [name], MD  loperamide (IMODIUM) 1 MG/5ML solution Take 1 mg by mouth as needed for diarrhea or loose stools.    [provider]  loratadine (CLARITIN) 10 MG tablet Take 10 mg by mouth daily as needed.     [provider]  metoprolol tartrate (LOPRESSOR) 25 MG tablet Take 0.5 tablets (12.5 mg total) by mouth 2 (two) times daily. 05/30/20   [name], MD  mupirocin ointment [facility]) 2 % One application to affected area on right leg. 02/13/19   Ngetich, Dinah C, NP  nitroGLYCERIN (NITRODUR - DOSED IN MG/24 HR) 0.2 mg/hr patch Place 1 patch (0.2 mg total) onto the skin daily. For Shoulder pain. Remove old patch. 09/21/20   [name], MD  Probiotic Product (PROBIOTIC-10) CHEW Chew 1 each by mouth daily.     [provider]  tamsulosin (FLOMAX) 0.4 MG CAPS capsule TAKE 1 CAPSULE BY MOUTH  DAILY FOR PROSTATE 12/19/20   Lauree Chandler, NP  trolamine salicylate (ASPERCREME) 10 % cream Apply 1 application topically as needed for muscle pain.    [provider]      Allergies    Aspirin, Atorvastatin, Prednisone, Simvastatin, Sulfa antibiotics, Other, Latex, Metformin hcl, and Voltaren [diclofenac sodium]    Review of Systems  Review of Systems  Constitutional:  Positive for fever. Negative for appetite change and fatigue.  HENT:  Negative for congestion, ear discharge and sinus pressure.   Eyes:  Negative for discharge.  Respiratory:  Positive for cough.   Cardiovascular:  Negative for chest pain.  Gastrointestinal:  Negative for abdominal pain and diarrhea.  Genitourinary:  Negative for frequency and hematuria.  Musculoskeletal:  Negative for back pain.  Skin:  Negative for rash.  Neurological:  Negative for seizures and headaches.  Psychiatric/Behavioral:  Negative for hallucinations.     Physical Exam Updated Vital Signs BP 127/70    Pulse 100    Temp 99.2 F (37.3 C) (Oral)    Resp (!) 24    Ht '5\' 6"'$  (1.676 m)    Wt 71.1 kg    SpO2 97%    BMI 25.31 kg/m  Physical Exam Vitals and nursing note reviewed.  Constitutional:      Appearance: He is well-developed.  HENT:     Head: Normocephalic.     Nose: Nose normal.  Eyes:     General: No scleral icterus.    Conjunctiva/sclera: Conjunctivae normal.  Neck:     Thyroid: No thyromegaly.  Cardiovascular:     Rate and Rhythm: Normal rate and regular rhythm.     Heart sounds: No murmur heard.   No friction rub. No gallop.  Pulmonary:     Breath sounds: No stridor. Wheezing present. No rales.     Comments: Patient has rhonchi in the right upper chest Chest:     Chest wall: No tenderness.  Abdominal:     General: There is no distension.     Tenderness: There is no abdominal tenderness. There is no rebound.  Musculoskeletal:        General: Normal range of motion.     Cervical back: Neck supple.  Lymphadenopathy:     Cervical: No cervical adenopathy.  Skin:    Findings: No erythema or rash.  Neurological:     Mental Status: He is alert and oriented to person, place, and time.     Motor: No abnormal muscle tone.     Coordination: Coordination normal.  Psychiatric:        Behavior: Behavior normal.    ED Results / Procedures / Treatments   Labs (all labs ordered are listed, but only abnormal results are displayed) Labs Reviewed  CBC WITH DIFFERENTIAL/PLATELET - Abnormal; Notable for the following components:      Result Value   Hemoglobin 12.8 (*)    HCT 38.0 (*)    Monocytes Absolute 1.2 (*)    All other components within normal limits  COMPREHENSIVE METABOLIC PANEL - Abnormal; Notable for the following components:   Sodium 134 (*)    Glucose, Bld 115 (*)    Calcium 8.6 (*)    All other components within normal limits  RESP PANEL BY RT-PCR (FLU A&B, COVID) ARPGX2  URINALYSIS, ROUTINE W REFLEX  MICROSCOPIC    EKG None  Radiology DG Chest 2 View  Result Date: 03/29/2021 CLINICAL DATA:  Cough, fever EXAM: CHEST - 2 VIEW COMPARISON:  07/10/2018, 06/23/2020 FINDINGS: The heart size and mediastinal contours are within normal limits. Aortic atherosclerosis. Chronically coarsened interstitial markings within the lung bases. No focal airspace consolidation, pleural effusion, or pneumothorax. The visualized skeletal structures are unremarkable. IMPRESSION: No acute cardiopulmonary abnormality. Electronically Signed   By: Davina Poke D.O.   On: 03/29/2021 12:48    Procedures Procedures  Medications Ordered in ED Medications  cefTRIAXone (ROCEPHIN) 2 g in sodium chloride 0.9 % 100 mL IVPB (2 g Intravenous New Bag/Given 03/29/21 1505)  azithromycin (ZITHROMAX) 500 mg in sodium chloride 0.9 % 250 mL IVPB (500 mg Intravenous New Bag/Given 03/29/21 1513)  sodium chloride 0.9 % bolus 500 mL (500 mLs Intravenous New Bag/Given 03/29/21 1504)    ED Course/ Medical Decision Making/ A&P                           Medical Decision Making Amount and/or Complexity of Data Reviewed Labs: ordered. Radiology: ordered.  Risk Decision regarding hospitalization.  This patient presents to the ED for concern of cough and fever, this involves an extensive number of treatment options, and is a complaint that carries with it a high risk of complications and morbidity.  The differential diagnosis includes pneumonia, sepsis,   Co morbidities that complicate the patient evaluation  Diabetes   Additional history obtained:  Additional history obtained from spouse External records from outside source obtained and reviewed including hospital records   Lab Tests:  I Ordered, and personally interpreted labs.  The pertinent results include: CBC and chemistries that show mild anemia at 12.8   Imaging Studies ordered:  I ordered imaging studies including chest x-ray I independently visualized and  interpreted imaging which showed no acute disease I agree with the radiologist interpretation   Cardiac Monitoring:  The patient was maintained on a cardiac monitor.  I personally viewed and interpreted the cardiac monitored which showed an underlying rhythm of: Normal sinus rhythm   Medicines ordered and prescription drug management:  I ordered medication including Rocephin and Zithromax for pneumonia Reevaluation of the patient after these medicines showed that the patient stayed the same I have reviewed the patients home medicines and have made adjustments as needed   Test Considered:  CT chest   Critical Interventions:  IV antibiotic   Consultations Obtained:  I requested consultation with the hospitalist,  and discussed lab and imaging findings as well as pertinent plan - they recommend: Admission for IV antibiotic   Problem List / ED Course:  Respiratory infection most likely pneumonia    Social Determinants of Health:  None        Patient with less likely pneumonia because of the fever cough and sputum production.  He will be admitted for IV antibiotics        Final Clinical Impression(s) / ED Diagnoses Final diagnoses:  Community acquired pneumonia of right upper lobe of lung    Rx / DC Orders ED Discharge Orders     None         [name] Ferguson, MD 03/29/21 1549

## 2021-03-30 LAB — RESPIRATORY PANEL BY PCR

## 2021-03-30 LAB — CBC WITH DIFFERENTIAL/PLATELET
Abs Immature Granulocytes: 0.03 10*3/uL (ref 0.00–0.07)
Basophils Absolute: 0 10*3/uL (ref 0.0–0.1)
Basophils Relative: 0 %
Eosinophils Absolute: 0 10*3/uL (ref 0.0–0.5)
Eosinophils Relative: 0 %
HCT: 38.7 % — ABNORMAL LOW (ref 39.0–52.0)
Hemoglobin: 12.9 g/dL — ABNORMAL LOW (ref 13.0–17.0)
Immature Granulocytes: 1 %
Lymphocytes Relative: 13 %
Lymphs Abs: 0.7 10*3/uL (ref 0.7–4.0)
MCH: 30.8 pg (ref 26.0–34.0)
MCHC: 33.3 g/dL (ref 30.0–36.0)
MCV: 92.4 fL (ref 80.0–100.0)
Monocytes Absolute: 0.3 10*3/uL (ref 0.1–1.0)
Monocytes Relative: 6 %
Neutro Abs: 4.2 10*3/uL (ref 1.7–7.7)
Neutrophils Relative %: 80 %
Platelets: 155 10*3/uL (ref 150–400)
RBC: 4.19 MIL/uL — ABNORMAL LOW (ref 4.22–5.81)
RDW: 12.6 % (ref 11.5–15.5)
WBC: 5.3 10*3/uL (ref 4.0–10.5)
nRBC: 0 % (ref 0.0–0.2)

## 2021-03-30 LAB — COMPREHENSIVE METABOLIC PANEL
ALT: 15 U/L (ref 0–44)
AST: 16 U/L (ref 15–41)
Albumin: 3.7 g/dL (ref 3.5–5.0)
Alkaline Phosphatase: 62 U/L (ref 38–126)
Anion gap: 8 (ref 5–15)
BUN: 15 mg/dL (ref 8–23)
CO2: 21 mmol/L — ABNORMAL LOW (ref 22–32)
Calcium: 8.6 mg/dL — ABNORMAL LOW (ref 8.9–10.3)
Chloride: 107 mmol/L (ref 98–111)
Creatinine, Ser: 0.91 mg/dL (ref 0.61–1.24)
GFR, Estimated: >60 mL/min (ref >60)
Glucose, Bld: 246 mg/dL — ABNORMAL HIGH (ref 70–99)
Potassium: 4.2 mmol/L (ref 3.5–5.1)
Sodium: 136 mmol/L (ref 135–145)
Total Bilirubin: 0.4 mg/dL (ref 0.3–1.2)
Total Protein: 6.5 g/dL (ref 6.5–8.1)

## 2021-03-30 LAB — GLUCOSE, CAPILLARY
Glucose-Capillary: 232 mg/dL — ABNORMAL HIGH (ref 70–99)
Glucose-Capillary: 202 mg/dL — ABNORMAL HIGH (ref 70–99)
Glucose-Capillary: 260 mg/dL — ABNORMAL HIGH (ref 70–99)

## 2021-03-30 MED ORDER — INSULIN ASPART 100 UNIT/ML IJ SOLN
10.0000 [IU] | Freq: Three times a day (TID) | INTRAMUSCULAR | Status: DC
Start: 2021-03-30 — End: 2021-04-02
  Administered 2021-03-30 – 2021-04-02 (×9): 10 [IU] via SUBCUTANEOUS

## 2021-03-30 MED ORDER — METHYLPREDNISOLONE SODIUM SUCC 40 MG IJ SOLR
20.0000 mg | Freq: Every day | INTRAMUSCULAR | Status: DC
  Administered 2021-03-30 – 2021-04-01 (×3): 20 mg via INTRAVENOUS
  Filled 2021-03-30 (×3): qty 1

## 2021-03-30 NOTE — Assessment & Plan Note (Deleted)
Acute bronchitis versus incipient bronchopneumonia ?Wheezing: ?Presenting with low-grade fever with respiratory illness, no significant pneumonia on x-ray, procalcitonin negative.  This morning he still has ongoing wheezing and cough, will add low-dose Solu-Medrol IV and increase insulin regimen, continue empiric antibiotics, antitussives, bronchodilators as needed, check respiratory virus panel to rule out other etiologies.  Currently no signs of sepsis, hemodynamically stable. ?

## 2021-03-30 NOTE — Assessment & Plan Note (Addendum)
With uncontrolled hyperglycemia, A1c 7.5.  In the setting of acute illness and steroid.  Unfortunately has ongoing steroid need-insulin was increased for hyperglycemia due to steroid and he will go back on his home regimen of Humalog and Levemir. ?Recent Labs  ?Lab 03/29/21 ?1212 03/29/21 ?1733 04/02/21 ?0722 04/02/21 ?1110 04/02/21 ?1547 04/02/21 ?2120 04/03/21 ?0736  ?GLUCAP  --    < > 216* 214* 205* 168* 139*  ?HGBA1C 7.5*  --   --   --   --   --   --   ? < > = values in this interval not displayed.  ? ?

## 2021-03-30 NOTE — Assessment & Plan Note (Signed)
Stable hb. ?

## 2021-03-30 NOTE — Assessment & Plan Note (Addendum)
Cal stable, outpatient follow-up ?

## 2021-03-30 NOTE — Assessment & Plan Note (Addendum)
Continue Pepcid  

## 2021-03-30 NOTE — Assessment & Plan Note (Signed)
Not on medication follow-up with PCP ?

## 2021-03-30 NOTE — Assessment & Plan Note (Addendum)
Stable, continue Flomax ?

## 2021-03-30 NOTE — Hospital Course (Addendum)
88 y.o. male with allergic rhinitis, carpal tunnel syndrome, cervical spondylosis, cervicalgia, GERD, diverticulosis, BPH, prostate CA, insomnia, internal hemorrhoids, IBS, macular degeneration, neurogenic bladder, neuropathy NOS, orthostatic hypotension, osteoarthrosis, hyperlipidemia, paroxysmal SVT, restless syndrome, incomplete RBBB, retinal detachment, hypertension, type 2 diabetes presented with fever, sore throat, productive cough with yellow sputum dyspnea/wheezing fatigue malaise and body aches since Monday. ?In the ED low-grade temp 98.2, rest of vital stable Labs showed WBC count 9 stable electrolytes COVID-19 PCR negative chest x-ray chronic coarse interstitial marking at lung bases, ?Patient was placed on antibiotics due to SIRS and admitted.  He has been having worsening wheezing cough sore throat, respiratory virus panel came back with metapneumovirus antibiotic de-escalated continue on steroids bronchodilators as he remains symptomatic.  Patient completed antibiotic course, he was given IV steroids for ongoing wheezing cough and at this time he has clinically improved, we will mobilize him make sure he is doing well without shortness of breath wheezing or significant tachycardia or hypoxia on ambulation before discharge.  ?

## 2021-03-30 NOTE — Plan of Care (Signed)
?  Problem: Education: ?Goal: Knowledge of General Education information will improve ?Description: Including pain rating scale, medication(s)/side effects and non-pharmacologic comfort measures ?Outcome: Progressing ?  ?Problem: Clinical Measurements: ?Goal: Respiratory complications will improve ?Outcome: Progressing ?  ?Problem: Activity: ?Goal: Risk for activity intolerance will decrease ?Outcome: Progressing ?  ?Problem: Nutrition: ?Goal: Adequate nutrition will be maintained ?Outcome: Progressing ?  ?Problem: Coping: ?Goal: Level of anxiety will decrease ?Outcome: Progressing ?  ?Problem: Elimination: ?Goal: Will not experience complications related to bowel motility ?Outcome: Progressing ?Goal: Will not experience complications related to urinary retention ?Outcome: Progressing ?  ?Problem: Safety: ?Goal: Ability to remain free from injury will improve ?Outcome: Progressing ?  ?

## 2021-03-30 NOTE — Assessment & Plan Note (Signed)
BP well controlled continue metoprolol ?

## 2021-03-30 NOTE — Progress Notes (Signed)
PROGRESS NOTE Micheal [name]  PNT:[id number] DOB: [birth year] DOA: 03/29/2021 PCP: [name], MD   Brief Narrative/Hospital Course: 88 y.o. male with allergic rhinitis, carpal tunnel syndrome, cervical spondylosis, cervicalgia, GERD, diverticulosis, BPH, prostate CA, insomnia, internal hemorrhoids, IBS, macular degeneration, neurogenic bladder, neuropathy NOS, orthostatic hypotension, osteoarthrosis, hyperlipidemia, paroxysmal SVT, restless syndrome, incomplete RBBB, retinal detachment, hypertension, type 2 diabetes presented with fever, sore throat, productive cough with yellow sputum dyspnea/wheezing fatigue malaise and body aches since Monday. In the ED low-grade temp 98.2, rest of vital stable Labs showed WBC count 9 stable electrolytes COVID-19 PCR negative chest x-ray chronic coarse interstitial marking at lung bases, Patient was placed on antibiotics due to SIRS and admitteD    Subjective: Seen and examined.  Patient reports he feels somewhat better but he still has wheezing Denies chest pain nausea vomiting.  Assessment and Plan: * SIRS (systemic inflammatory response syndrome) (HCC) Acute bronchitis versus incipient bronchopneumonia Wheezing: Presenting with low-grade fever with respiratory illness, no significant pneumonia on x-ray, procalcitonin negative.  This morning he still has ongoing wheezing and cough, will add low-dose Solu-Medrol IV and increase insulin regimen, continue empiric antibiotics, antitussives, bronchodilators as needed, check respiratory virus panel to rule out other etiologies.  Currently no signs of sepsis, hemodynamically stable.  DM (diabetes mellitus), type 2 with peripheral vascular complications ([place]) With uncontrolled hyperglycemia, A1c 7.5.  In the setting of acute illness and steroid.  Unfortunately has ongoing steroid need, continue with 46 units Lantus home dose, add 10 units Premeal insulin, also continue SSI monitor and adjust insulin regimen.    Recent Labs  Lab 03/29/21 1212 03/29/21 1733 03/29/21 2030 03/29/21 2040  GLUCAP  --  82 304* 317*  HGBA1C 7.5*  --   --   --     Normocytic anemia Stable hb.  Hypocalcemia Cal at 8.6, outpatient follow-up  BPH (benign prostatic hyperplasia) Continue Flomax  Esophageal reflux Continue Pepcid I  Essential hypertension BP well controlled continue metoprolol  Hyperlipidemia Not on medication follow-up with PCP  DVT prophylaxis: enoxaparin (LOVENOX) injection 40 mg Start: 03/29/21 1600 Code Status:   Code Status: Full Code Family Communication: plan of care discussed with patient  at bedside.  Disposition: Currently not medically stable for discharge. Status is: Inpatient. Remains inpatient appropriate because: Ongoing management of his respiratory illness.  He lives at home with his 19 year old wife.  Objective: Vitals last 24 hrs: Vitals:   03/29/21 2229 03/30/21 0226 03/30/21 0619 03/30/21 0730  BP: 111/69 115/68 123/72   Pulse: (!) 103 89 91   Resp: '20 20 18 19  '$ Temp: 98.1 F (36.7 C) (!) 97.5 F (36.4 C) 97.6 F (36.4 C)   TempSrc: Oral     SpO2: 93% 95% 93% 94%  Weight:      Height:       Weight change:   Physical Examination: General exam: AA OX3,older than stated age, weak appearing. HEENT:Oral mucosa moist, Ear/Nose WNL grossly, dentition normal. Respiratory system: bilaterally significant wheezing diffusely, no use of accessory muscle Cardiovascular system: S1 & S2 +, No JVD,. Gastrointestinal system: Abdomen soft,NT,ND, BS+ Nervous System:Alert, awake, moving extremities and grossly nonfocal Extremities: LE edema none,distal peripheral pulses palpable.  Skin: No rashes,no icterus. MSK: Normal muscle bulk,tone, power  Medications reviewed: Scheduled Meds:  albuterol  2.5 mg Nebulization Once   dextromethorphan-guaiFENesin  1 tablet Oral BID   enoxaparin (LOVENOX) injection  40 mg Subcutaneous Q24H   famotidine  10 mg Oral BID   gabapentin  300 mg Oral TID   insulin aspart  0-15 Units Subcutaneous TID WC   insulin aspart  10 Units Subcutaneous TID WC   insulin glargine-yfgn  46 Units Subcutaneous Daily   ipratropium-albuterol  3 mL Nebulization TID   loratadine  10 mg Oral Daily   methylPREDNISolone (SOLU-MEDROL) injection  20 mg Intravenous Daily   metoprolol tartrate  12.5 mg Oral BID   tamsulosin  0.4 mg Oral Daily   Continuous Infusions:  azithromycin     cefTRIAXone (ROCEPHIN)  IV        Diet Order             Diet heart healthy/carb modified Room service appropriate? Yes; Fluid consistency: Thin  Diet effective now                            Intake/Output Summary (Last 24 hours) at 03/30/2021 1038 Last data filed at 03/30/2021 0900 Gross per 24 hour  Intake 360 ml  Output 2025 ml  Net -1665 ml   Net IO Since Admission: -1,665 mL [03/30/21 1038]  Wt Readings from Last 3 Encounters:  03/29/21 71.1 kg  02/28/21 72 kg  01/17/21 72.1 kg     Unresulted Labs (From admission, onward)     Start     Ordered   04/05/21 0500  Creatinine, serum  (enoxaparin (LOVENOX)    CrCl >/= 30 ml/min)  Weekly,   R     Comments: while on enoxaparin therapy    03/29/21 1527   03/31/21 7939  Basic metabolic panel  Tomorrow morning,   R        03/30/21 0832   03/30/21 0833  Respiratory (~20 pathogens) panel by PCR  (Respiratory panel by PCR (~20 pathogens, ~24 hr TAT)  w precautions)  ONCE - STAT,   STAT        03/30/21 0300          Data Reviewed: I have personally reviewed following labs and imaging studies CBC: Recent Labs  Lab 03/29/21 1212 03/30/21 0455  WBC 9.0 5.3  NEUTROABS 6.5 4.2  HGB 12.8* 12.9*  HCT 38.0* 38.7*  MCV 90.0 92.4  PLT 183 923   Basic Metabolic Panel: Recent Labs  Lab 03/29/21 1212 03/30/21 0455  NA 134* 136  K 3.7 4.2  CL 105 107  CO2 22 21*  GLUCOSE 115* 246*  BUN 14 15  CREATININE 0.96 0.91  CALCIUM 8.6* 8.6*  MG 2.3  --   PHOS 3.5  --    GFR: Estimated  Creatinine Clearance: 47.7 mL/min (by C-G formula based on SCr of 0.91 mg/dL). Liver Function Tests: Recent Labs  Lab 03/29/21 1212 03/30/21 0455  AST 19 16  ALT 16 15  ALKPHOS 68 62  BILITOT 0.9 0.4  PROT 6.5 6.5  ALBUMIN 4.0 3.7   No results for input(s): LIPASE, AMYLASE in the last 168 hours. No results for input(s): AMMONIA in the last 168 hours. Coagulation Profile: No results for input(s): INR, PROTIME in the last 168 hours. Cardiac Enzymes: No results for input(s): CKTOTAL, CKMB, CKMBINDEX, TROPONINI in the last 168 hours. BNP (last 3 results) No results for input(s): PROBNP in the last 8760 hours. HbA1C: Recent Labs    03/29/21 1212  HGBA1C 7.5*   CBG: Recent Labs  Lab 03/29/21 1733 03/29/21 2030 03/29/21 2040  GLUCAP 82 304* 317*   Lipid Profile: No results for input(s): CHOL, HDL, LDLCALC, TRIG, CHOLHDL,  LDLDIRECT in the last 72 hours. Thyroid Function Tests: No results for input(s): TSH, T4TOTAL, FREET4, T3FREE, THYROIDAB in the last 72 hours. Anemia Panel: No results for input(s): VITAMINB12, FOLATE, FERRITIN, TIBC, IRON, RETICCTPCT in the last 72 hours. Sepsis Labs: Recent Labs  Lab 03/29/21 1230  PROCALCITON <0.10    Recent Results (from the past 240 hour(s))  Resp Panel by RT-PCR (Flu A&B, Covid) Nasopharyngeal Swab     Status: None   Collection Time: 03/29/21 12:30 PM   Specimen: Nasopharyngeal Swab; Nasopharyngeal(NP) swabs in vial transport medium  Result Value Ref Range Status   SARS Coronavirus 2 by RT PCR NEGATIVE NEGATIVE Final    Comment: (NOTE) SARS-CoV-2 target nucleic acids are NOT DETECTED.  The SARS-CoV-2 RNA is generally detectable in upper respiratory specimens during the acute phase of infection. The lowest concentration of SARS-CoV-2 viral copies this assay can detect is 138 copies/mL. A negative result does not preclude SARS-Cov-2 infection and should not be used as the sole basis for treatment or other patient management  decisions. A negative result may occur with  improper specimen collection/handling, submission of specimen other than nasopharyngeal swab, presence of viral mutation(s) within the areas targeted by this assay, and inadequate number of viral copies(<138 copies/mL). A negative result must be combined with clinical observations, patient history, and epidemiological information. The expected result is Negative.  Fact Sheet for Patients:  EntrepreneurPulse.com.au  Fact Sheet for Healthcare Providers:  IncredibleEmployment.be  This test is no t yet approved or cleared by the [place] FDA and  has been authorized for detection and/or diagnosis of SARS-CoV-2 by FDA under an Emergency Use Authorization (EUA). This EUA will remain  in effect (meaning this test can be used) for the duration of the COVID-19 declaration under Section 564(b)(1) of the Act, 21 U.S.C.section 360bbb-3(b)(1), unless the authorization is terminated  or revoked sooner.       Influenza A by PCR NEGATIVE NEGATIVE Final   Influenza B by PCR NEGATIVE NEGATIVE Final    Comment: (NOTE) The Xpert Xpress SARS-CoV-2/FLU/RSV plus assay is intended as an aid in the diagnosis of influenza from Nasopharyngeal swab specimens and should not be used as a sole basis for treatment. Nasal washings and aspirates are unacceptable for Xpert Xpress SARS-CoV-2/FLU/RSV testing.  Fact Sheet for Patients: EntrepreneurPulse.com.au  Fact Sheet for Healthcare Providers: IncredibleEmployment.be  This test is not yet approved or cleared by the [place] FDA and has been authorized for detection and/or diagnosis of SARS-CoV-2 by FDA under an Emergency Use Authorization (EUA). This EUA will remain in effect (meaning this test can be used) for the duration of the COVID-19 declaration under Section 564(b)(1) of the Act, 21 U.S.C. section 360bbb-3(b)(1), unless the  authorization is terminated or revoked.  Performed at [facility], [place] [address], [place], [place] [address]     Antimicrobials: Anti-infectives (From admission, onward)    Start     Dose/Rate Route Frequency Ordered Stop   03/30/21 1500  cefTRIAXone (ROCEPHIN) 1 g in sodium chloride 0.9 % 100 mL IVPB        1 g 200 mL/hr over 30 Minutes Intravenous Every 24 hours 03/29/21 1527 04/04/21 1459   03/30/21 1500  azithromycin (ZITHROMAX) 500 mg in sodium chloride 0.9 % 250 mL IVPB        500 mg 250 mL/hr over 60 Minutes Intravenous Every 24 hours 03/29/21 1527 04/04/21 1459   03/29/21 1445  cefTRIAXone (ROCEPHIN) 2 g in sodium chloride 0.9 %  100 mL IVPB        2 g 200 mL/hr over 30 Minutes Intravenous  Once 03/29/21 1440 03/29/21 1535   03/29/21 1445  azithromycin (ZITHROMAX) 500 mg in sodium chloride 0.9 % 250 mL IVPB        500 mg 250 mL/hr over 60 Minutes Intravenous  Once 03/29/21 1440 03/29/21 1613      Culture/Microbiology    Component Value Date/Time   SDES BLOOD RIGHT FOREARM 07/10/2018 1200   SPECREQUEST  07/10/2018 1200    BOTTLES DRAWN AEROBIC AND ANAEROBIC Blood Culture adequate volume   CULT  07/10/2018 1200    NO GROWTH 5 DAYS Performed at [facility], [place] [address] [place], [place] [address]    REPTSTATUS 07/15/2018 FINAL 07/10/2018 1200    Other culture-see note  Radiology Studies: DG Chest 2 View  Result Date: 03/29/2021 CLINICAL DATA:  Cough, fever EXAM: CHEST - 2 VIEW COMPARISON:  07/10/2018, 06/23/2020 FINDINGS: The heart size and mediastinal contours are within normal limits. Aortic atherosclerosis. Chronically coarsened interstitial markings within the lung bases. No focal airspace consolidation, pleural effusion, or pneumothorax. The visualized skeletal structures are unremarkable. IMPRESSION: No acute cardiopulmonary abnormality. Electronically Signed   By: Davina Poke D.O.   On: 03/29/2021 12:48     LOS: 1 day    Antonieta Pert, MD [facility]  03/30/2021, 10:38 AM

## 2021-03-31 DIAGNOSIS — J211 Acute bronchiolitis due to human metapneumovirus: Principal | ICD-10-CM

## 2021-03-31 LAB — BASIC METABOLIC PANEL
Anion gap: 9 (ref 5–15)
BUN: 21 mg/dL (ref 8–23)
CO2: 20 mmol/L — ABNORMAL LOW (ref 22–32)
Calcium: 8.9 mg/dL (ref 8.9–10.3)
Chloride: 107 mmol/L (ref 98–111)
Creatinine, Ser: 1 mg/dL (ref 0.61–1.24)
GFR, Estimated: >60 mL/min (ref >60)
Glucose, Bld: 181 mg/dL — ABNORMAL HIGH (ref 70–99)
Potassium: 4.5 mmol/L (ref 3.5–5.1)
Sodium: 136 mmol/L (ref 135–145)

## 2021-03-31 LAB — GLUCOSE, CAPILLARY
Glucose-Capillary: 257 mg/dL — ABNORMAL HIGH (ref 70–99)
Glucose-Capillary: 150 mg/dL — ABNORMAL HIGH (ref 70–99)
Glucose-Capillary: 218 mg/dL — ABNORMAL HIGH (ref 70–99)
Glucose-Capillary: 157 mg/dL — ABNORMAL HIGH (ref 70–99)
Glucose-Capillary: 233 mg/dL — ABNORMAL HIGH (ref 70–99)

## 2021-03-31 MED ORDER — AZITHROMYCIN 250 MG PO TABS
500.0000 mg | ORAL_TABLET | Freq: Every day | ORAL | Status: AC
  Administered 2021-03-31 – 2021-04-02 (×3): 500 mg via ORAL
  Filled 2021-03-31 (×3): qty 2

## 2021-03-31 MED ORDER — FLUTICASONE PROPIONATE 50 MCG/ACT NA SUSP
1.0000 | Freq: Every day | NASAL | Status: DC
  Administered 2021-03-31 – 2021-04-03 (×4): 1 via NASAL
  Filled 2021-03-31 (×2): qty 16

## 2021-03-31 MED ORDER — MENTHOL 3 MG MT LOZG
1.0000 | LOZENGE | OROMUCOSAL | Status: DC | PRN
  Administered 2021-04-02: 3 mg via ORAL
  Filled 2021-03-31: qty 9

## 2021-03-31 NOTE — Assessment & Plan Note (Signed)
See #1 °

## 2021-03-31 NOTE — Assessment & Plan Note (Addendum)
Acute bronchitis/bronchiolitis due to metapneumovirus ?Ongoing wheezing: ?SIRS on admission due to viral infection: ?no significant pneumonia on x-ray, procalcitonin negative ?This morning has bilateral wheezing diminished breath sounds chest soreness from coughing-EKG nonischemic.  Repeat chest x-ray no acute finding. ?Treated with IV steroids bronchodilators antitussives Claritin/Flonase, mucolytic and slowly improving.  Ambulate in the hallway without oxygen. ?

## 2021-03-31 NOTE — Clinical Social Work Note (Signed)
?  Transition of Care (TOC) Screening Note ? ? ?Patient Details  ?Name: Micheal [name] ?Date of Birth: 1928/03/20 ? ? ?Transition of Care (TOC) CM/SW Contact:    ?Trish Mage, LCSW ?Phone Number: ?03/31/2021, 3:27 PM ? ? ? ?Transition of Care Department [facility]) has reviewed patient and no TOC needs have been identified at this time. We will continue to monitor patient advancement through interdisciplinary progression rounds. If new patient transition needs arise, please place a TOC consult. ? ? ?

## 2021-03-31 NOTE — Progress Notes (Signed)
PROGRESS NOTE ANTONIUS [name]  JJK:[id number] DOB: 03-Aug-1928 DOA: 03/29/2021 PCP: [name], MD   Brief Narrative/Hospital Course: 88 y.o. male with allergic rhinitis, carpal tunnel syndrome, cervical spondylosis, cervicalgia, GERD, diverticulosis, BPH, prostate CA, insomnia, internal hemorrhoids, IBS, macular degeneration, neurogenic bladder, neuropathy NOS, orthostatic hypotension, osteoarthrosis, hyperlipidemia, paroxysmal SVT, restless syndrome, incomplete RBBB, retinal detachment, hypertension, type 2 diabetes presented with fever, sore throat, productive cough with yellow sputum dyspnea/wheezing fatigue malaise and body aches since Monday. In the ED low-grade temp 98.2, rest of vital stable Labs showed WBC count 9 stable electrolytes COVID-19 PCR negative chest x-ray chronic coarse interstitial marking at lung bases, Patient was placed on antibiotics due to SIRS and admitted.  He has been having worsening wheezing cough sore throat, respiratory virus panel came back with metapneumovirus antibiotic de-escalated continue on steroids bronchodilators as he remains symptomatic.    Subjective: Seen and examined this morning.  Still complains of distention throughout cough " I cannot get it out from throat" Overall feeling better compared to admission.  Assessment and Plan: * Acute bronchiolitis due to human metapneumovirus Acute bronchitis/bronchiolitis due to metapneumovirus Ongoing wheezing: SIRS on admission due to viral infection: Patient at this time afebrile,no significant pneumonia on x-ray, procalcitonin negative.  RVP came back positive for metapneumovirus, continue with supportive care with bronchodilators systemic steroids antitussives, Claritin/Flonase, mucolytic's, supportive care.  SIRS (systemic inflammatory response syndrome) ([place]) See #1  DM (diabetes mellitus), type 2 with peripheral vascular complications ([place]) With uncontrolled hyperglycemia, A1c 7.5.  In the  setting of acute illness and steroid.  Unfortunately has ongoing steroid need, continue with 46 units Lantus home dose, added 10 units Premeal insulin, continue on sliding scale.  Monitor closely while on systemic steroid.  Cannot tolerate p.o. steroid we will plan to taper off IV steroids inpatient Recent Labs  Lab 03/29/21 1212 03/29/21 1733 03/30/21 0804 03/30/21 1150 03/30/21 1729 03/30/21 2110 03/31/21 0800  GLUCAP  --    < > 257* 232* 202* 260* 150*  HGBA1C 7.5*  --   --   --   --   --   --    < > = values in this interval not displayed.    Normocytic anemia Stable hb.  Hypocalcemia Cal at 8.6, outpatient follow-up  BPH (benign prostatic hyperplasia) Stable, continue Flomax  Esophageal reflux Continue Pepcid.  Essential hypertension BP well controlled continue metoprolol  Hyperlipidemia Not on medication follow-up with PCP  DVT prophylaxis: enoxaparin (LOVENOX) injection 40 mg Start: 03/29/21 1600 Code Status:   Code Status: Full Code Family Communication: plan of care discussed with patient  at bedside.  Disposition: Currently not medically stable for discharge. Status is: Inpatient. Remains inpatient appropriate because: Ongoing management of his respiratory illness.  He lives at home with his 51 year old wife.  Objective: Vitals last 24 hrs: Vitals:   03/30/21 1940 03/30/21 2016 03/31/21 0517 03/31/21 0838  BP:  132/61 132/72   Pulse:  (!) 110 81   Resp:  20 20   Temp:  97.7 F (36.5 C) (!) 97.5 F (36.4 C)   TempSrc:      SpO2: 95% 95% 96% 95%  Weight:      Height:       Weight change:   Physical Examination: General exam: AA0, elderly, older than stated age, weak appearing. HEENT:Oral mucosa moist, Ear/Nose WNL grossly, dentition normal. Respiratory system: bilaterally diminished,no use of accessory muscle.  Cough is triggered with deep breath Cardiovascular system: S1 & S2 +, No  JVD,. Gastrointestinal system: Abdomen soft,NT,ND, BS+ Nervous  System:Alert, awake, moving extremities and grossly nonfocal Extremities: edema neg,distal peripheral pulses palpable.  Skin: No rashes,no icterus. MSK: Normal muscle bulk,tone, power  Medications reviewed: Scheduled Meds:  albuterol  2.5 mg Nebulization Once   azithromycin  500 mg Oral Daily   dextromethorphan-guaiFENesin  1 tablet Oral BID   enoxaparin (LOVENOX) injection  40 mg Subcutaneous Q24H   famotidine  10 mg Oral BID   gabapentin  300 mg Oral TID   insulin aspart  0-15 Units Subcutaneous TID WC   insulin aspart  10 Units Subcutaneous TID WC   insulin glargine-yfgn  46 Units Subcutaneous Daily   ipratropium-albuterol  3 mL Nebulization TID   loratadine  10 mg Oral Daily   methylPREDNISolone (SOLU-MEDROL) injection  20 mg Intravenous Daily   metoprolol tartrate  12.5 mg Oral BID   tamsulosin  0.4 mg Oral Daily   Continuous Infusions:      Diet Order             Diet heart healthy/carb modified Room service appropriate? Yes; Fluid consistency: Thin  Diet effective now                            Intake/Output Summary (Last 24 hours) at 03/31/2021 1044 Last data filed at 03/31/2021 0900 Gross per 24 hour  Intake 1900 ml  Output 1645 ml  Net 255 ml   Net IO Since Admission: -1,410 mL [03/31/21 1044]  Wt Readings from Last 3 Encounters:  03/29/21 71.1 kg  02/28/21 72 kg  01/17/21 72.1 kg     Unresulted Labs (From admission, onward)     Start     Ordered   04/05/21 0500  Creatinine, serum  (enoxaparin (LOVENOX)    CrCl >/= 30 ml/min)  Weekly,   R     Comments: while on enoxaparin therapy    03/29/21 1527          Data Reviewed: I have personally reviewed following labs and imaging studies CBC: Recent Labs  Lab 03/29/21 1212 03/30/21 0455  WBC 9.0 5.3  NEUTROABS 6.5 4.2  HGB 12.8* 12.9*  HCT 38.0* 38.7*  MCV 90.0 92.4  PLT 183 427   Basic Metabolic Panel: Recent Labs  Lab 03/29/21 1212 03/30/21 0455 03/31/21 0510  NA 134* 136 136   K 3.7 4.2 4.5  CL 105 107 107  CO2 22 21* 20*  GLUCOSE 115* 246* 181*  BUN '14 15 21  '$ CREATININE 0.96 0.91 1.00  CALCIUM 8.6* 8.6* 8.9  MG 2.3  --   --   PHOS 3.5  --   --    GFR: Estimated Creatinine Clearance: 43.4 mL/min (by C-G formula based on SCr of 1 mg/dL). Liver Function Tests: Recent Labs  Lab 03/29/21 1212 03/30/21 0455  AST 19 16  ALT 16 15  ALKPHOS 68 62  BILITOT 0.9 0.4  PROT 6.5 6.5  ALBUMIN 4.0 3.7   No results for input(s): LIPASE, AMYLASE in the last 168 hours. No results for input(s): AMMONIA in the last 168 hours. Coagulation Profile: No results for input(s): INR, PROTIME in the last 168 hours. Cardiac Enzymes: No results for input(s): CKTOTAL, CKMB, CKMBINDEX, TROPONINI in the last 168 hours. BNP (last 3 results) No results for input(s): PROBNP in the last 8760 hours. HbA1C: Recent Labs    03/29/21 1212  HGBA1C 7.5*   CBG: Recent Labs  Lab 03/30/21 0804  03/30/21 1150 03/30/21 1729 03/30/21 2110 03/31/21 0800  GLUCAP 257* 232* 202* 260* 150*   Lipid Profile: No results for input(s): CHOL, HDL, LDLCALC, TRIG, CHOLHDL, LDLDIRECT in the last 72 hours. Thyroid Function Tests: No results for input(s): TSH, T4TOTAL, FREET4, T3FREE, THYROIDAB in the last 72 hours. Anemia Panel: No results for input(s): VITAMINB12, FOLATE, FERRITIN, TIBC, IRON, RETICCTPCT in the last 72 hours. Sepsis Labs: Recent Labs  Lab 03/29/21 1230  PROCALCITON <0.10    Recent Results (from the past 240 hour(s))  Resp Panel by RT-PCR (Flu A&B, Covid) Nasopharyngeal Swab     Status: None   Collection Time: 03/29/21 12:30 PM   Specimen: Nasopharyngeal Swab; Nasopharyngeal(NP) swabs in vial transport medium  Result Value Ref Range Status   SARS Coronavirus 2 by RT PCR NEGATIVE NEGATIVE Final    Comment: (NOTE) SARS-CoV-2 target nucleic acids are NOT DETECTED.  The SARS-CoV-2 RNA is generally detectable in upper respiratory specimens during the acute phase of  infection. The lowest concentration of SARS-CoV-2 viral copies this assay can detect is 138 copies/mL. A negative result does not preclude SARS-Cov-2 infection and should not be used as the sole basis for treatment or other patient management decisions. A negative result may occur with  improper specimen collection/handling, submission of specimen other than nasopharyngeal swab, presence of viral mutation(s) within the areas targeted by this assay, and inadequate number of viral copies(<138 copies/mL). A negative result must be combined with clinical observations, patient history, and epidemiological information. The expected result is Negative.  Fact Sheet for Patients:  EntrepreneurPulse.com.au  Fact Sheet for Healthcare Providers:  IncredibleEmployment.be  This test is no t yet approved or cleared by the [place] FDA and  has been authorized for detection and/or diagnosis of SARS-CoV-2 by FDA under an Emergency Use Authorization (EUA). This EUA will remain  in effect (meaning this test can be used) for the duration of the COVID-19 declaration under Section 564(b)(1) of the Act, 21 U.S.C.section 360bbb-3(b)(1), unless the authorization is terminated  or revoked sooner.       Influenza A by PCR NEGATIVE NEGATIVE Final   Influenza B by PCR NEGATIVE NEGATIVE Final    Comment: (NOTE) The Xpert Xpress SARS-CoV-2/FLU/RSV plus assay is intended as an aid in the diagnosis of influenza from Nasopharyngeal swab specimens and should not be used as a sole basis for treatment. Nasal washings and aspirates are unacceptable for Xpert Xpress SARS-CoV-2/FLU/RSV testing.  Fact Sheet for Patients: EntrepreneurPulse.com.au  Fact Sheet for Healthcare Providers: IncredibleEmployment.be  This test is not yet approved or cleared by the [place] FDA and has been authorized for detection and/or diagnosis of SARS-CoV-2  by FDA under an Emergency Use Authorization (EUA). This EUA will remain in effect (meaning this test can be used) for the duration of the COVID-19 declaration under Section 564(b)(1) of the Act, 21 U.S.C. section 360bbb-3(b)(1), unless the authorization is terminated or revoked.  Performed at [facility], [place] [address], [place], [place] [address]   Respiratory (~20 pathogens) panel by PCR     Status: Abnormal   Collection Time: 03/30/21  8:55 AM   Specimen: Nasopharyngeal Swab; Respiratory  Result Value Ref Range Status   Adenovirus NOT DETECTED NOT DETECTED Final   Coronavirus 229E NOT DETECTED NOT DETECTED Final    Comment: (NOTE) The Coronavirus on the Respiratory Panel, DOES NOT test for the novel  Coronavirus (2019 nCoV)    Coronavirus HKU1 NOT DETECTED NOT DETECTED Final   Coronavirus NL63  NOT DETECTED NOT DETECTED Final   Coronavirus OC43 NOT DETECTED NOT DETECTED Final   Metapneumovirus DETECTED (A) NOT DETECTED Final   Rhinovirus / Enterovirus NOT DETECTED NOT DETECTED Final   Influenza A NOT DETECTED NOT DETECTED Final   Influenza B NOT DETECTED NOT DETECTED Final   Parainfluenza Virus 1 NOT DETECTED NOT DETECTED Final   Parainfluenza Virus 2 NOT DETECTED NOT DETECTED Final   Parainfluenza Virus 3 NOT DETECTED NOT DETECTED Final   Parainfluenza Virus 4 NOT DETECTED NOT DETECTED Final   Respiratory Syncytial Virus NOT DETECTED NOT DETECTED Final   Bordetella pertussis NOT DETECTED NOT DETECTED Final   Bordetella Parapertussis NOT DETECTED NOT DETECTED Final   Chlamydophila pneumoniae NOT DETECTED NOT DETECTED Final   Mycoplasma pneumoniae NOT DETECTED NOT DETECTED Final    Comment: Performed at [facility], [place] [address] [place], [place] [address]    Antimicrobials: Anti-infectives (From admission, onward)    Start     Dose/Rate Route Frequency Ordered Stop   03/31/21 1130  azithromycin (ZITHROMAX) tablet 500 mg        500 mg Oral  Daily 03/31/21 1032 04/03/21 0959   03/30/21 1500  cefTRIAXone (ROCEPHIN) 1 g in sodium chloride 0.9 % 100 mL IVPB  Status:  Discontinued        1 g 200 mL/hr over 30 Minutes Intravenous Every 24 hours 03/29/21 1527 03/31/21 1032   03/30/21 1500  azithromycin (ZITHROMAX) 500 mg in sodium chloride 0.9 % 250 mL IVPB  Status:  Discontinued        500 mg 250 mL/hr over 60 Minutes Intravenous Every 24 hours 03/29/21 1527 03/31/21 1032   03/29/21 1445  cefTRIAXone (ROCEPHIN) 2 g in sodium chloride 0.9 % 100 mL IVPB        2 g 200 mL/hr over 30 Minutes Intravenous  Once 03/29/21 1440 03/29/21 1535   03/29/21 1445  azithromycin (ZITHROMAX) 500 mg in sodium chloride 0.9 % 250 mL IVPB        500 mg 250 mL/hr over 60 Minutes Intravenous  Once 03/29/21 1440 03/29/21 1613      Culture/Microbiology    Component Value Date/Time   SDES BLOOD RIGHT FOREARM 07/10/2018 1200   SPECREQUEST  07/10/2018 1200    BOTTLES DRAWN AEROBIC AND ANAEROBIC Blood Culture adequate volume   CULT  07/10/2018 1200    NO GROWTH 5 DAYS Performed at [facility], [place] [address] [place], [place] [address]    REPTSTATUS 07/15/2018 FINAL 07/10/2018 1200    Other culture-see note  Radiology Studies: DG Chest 2 View  Result Date: 03/29/2021 CLINICAL DATA:  Cough, fever EXAM: CHEST - 2 VIEW COMPARISON:  07/10/2018, 06/23/2020 FINDINGS: The heart size and mediastinal contours are within normal limits. Aortic atherosclerosis. Chronically coarsened interstitial markings within the lung bases. No focal airspace consolidation, pleural effusion, or pneumothorax. The visualized skeletal structures are unremarkable. IMPRESSION: No acute cardiopulmonary abnormality. Electronically Signed   By: Davina Poke D.O.   On: 03/29/2021 12:48     LOS: 2 days   Antonieta Pert, MD [facility]  03/31/2021, 10:44 AM

## 2021-04-01 LAB — GLUCOSE, CAPILLARY
Glucose-Capillary: 126 mg/dL — ABNORMAL HIGH (ref 70–99)
Glucose-Capillary: 162 mg/dL — ABNORMAL HIGH (ref 70–99)
Glucose-Capillary: 116 mg/dL — ABNORMAL HIGH (ref 70–99)
Glucose-Capillary: 262 mg/dL — ABNORMAL HIGH (ref 70–99)

## 2021-04-01 MED ORDER — METHYLPREDNISOLONE SODIUM SUCC 40 MG IJ SOLR
40.0000 mg | Freq: Every day | INTRAMUSCULAR | Status: AC
  Administered 2021-04-02 – 2021-04-03 (×2): 40 mg via INTRAVENOUS
  Filled 2021-04-01 (×2): qty 1

## 2021-04-01 MED ORDER — METHYLPREDNISOLONE SODIUM SUCC 40 MG IJ SOLR
20.0000 mg | Freq: Once | INTRAMUSCULAR | Status: AC
  Administered 2021-04-01: 20 mg via INTRAVENOUS
  Filled 2021-04-01: qty 1

## 2021-04-01 NOTE — Progress Notes (Signed)
Pt walked in hallway with O2 sats running upper 90's. After about 200 feet HR increased to 125 and pt began to cough a lot. Got SOB and began to wheeze. Returned pt to room. Resting in bed. Cough subsiding. Will continue to monitor.  ?

## 2021-04-01 NOTE — Progress Notes (Signed)
FYI--Pt requesting a nebulizer machine for home. ?

## 2021-04-01 NOTE — Progress Notes (Signed)
PROGRESS NOTE Micheal [name]  VOZ:[id number] DOB: 01-Dec-1928 DOA: 03/29/2021 PCP: [name], MD   Brief Narrative/Hospital Course: 88 y.o. male with allergic rhinitis, carpal tunnel syndrome, cervical spondylosis, cervicalgia, GERD, diverticulosis, BPH, prostate CA, insomnia, internal hemorrhoids, IBS, macular degeneration, neurogenic bladder, neuropathy NOS, orthostatic hypotension, osteoarthrosis, hyperlipidemia, paroxysmal SVT, restless syndrome, incomplete RBBB, retinal detachment, hypertension, type 2 diabetes presented with fever, sore throat, productive cough with yellow sputum dyspnea/wheezing fatigue malaise and body aches since Monday. In the ED low-grade temp 98.2, rest of vital stable Labs showed WBC count 9 stable electrolytes COVID-19 PCR negative chest x-ray chronic coarse interstitial marking at lung bases, Patient was placed on antibiotics due to SIRS and admitted.  He has been having worsening wheezing cough sore throat, respiratory virus panel came back with metapneumovirus antibiotic de-escalated continue on steroids bronchodilators as he remains symptomatic.    Subjective: Seen this morning.  Patient reports he is feeling much better overall, but with ambulation has significant wheezing and shortness of breath and tachycardia. Overnight afebrile and rest of the vital stable Assessment and Plan: * Acute bronchiolitis due to human metapneumovirus Acute bronchitis/bronchiolitis due to metapneumovirus Ongoing wheezing: SIRS on admission due to viral infection: no significant pneumonia on x-ray, procalcitonin negative.RVP came back positive for metapneumovirus.  Still symptomatic although improving, symptomatic with wheezing shortness of breath and coughing spell with ambulation this morning not needing oxygen still.  Give extra 20 of Solu-Medrol and increase Solu-Medrol to 40 mg daily, continue with current bronchodilators, antitussives, antitussives, Claritin/Flonase,  mucolytic's, supportive care.  SIRS (systemic inflammatory response syndrome) ([place]) See #1  DM (diabetes mellitus), type 2 with peripheral vascular complications ([place]) With uncontrolled hyperglycemia, A1c 7.5.  In the setting of acute illness and steroid.  Unfortunately has ongoing steroid need, continue with 46 units Lantus home dose, 10 units Premeal insulin, and SSI.Monitor closely while on systemic steroid.  Cannot tolerate p.o. steroid we will plan to taper off IV steroids inpatient Recent Labs  Lab 03/29/21 1212 03/29/21 1733 03/31/21 0800 03/31/21 1214 03/31/21 1635 03/31/21 2015 04/01/21 0731  GLUCAP  --    < > 150* 218* 157* 233* 126*  HGBA1C 7.5*  --   --   --   --   --   --    < > = values in this interval not displayed.    Normocytic anemia Stable hb.  Hypocalcemia Cal at 8.6, outpatient follow-up  BPH (benign prostatic hyperplasia) Stable, continue Flomax  Esophageal reflux Continue Pepcid.  Essential hypertension BP well controlled continue metoprolol  Hyperlipidemia Not on medication follow-up with PCP  DVT prophylaxis: enoxaparin (LOVENOX) injection 40 mg Start: 03/29/21 1600 Code Status:   Code Status: Full Code Family Communication: plan of care discussed with patient  at bedside.  Disposition: Currently not medically stable for discharge. Status is: Inpatient. Remains inpatient appropriate because: Ongoing management of his respiratory illness.  He lives at home with his 69 year old wife.  Hopefully discharge home in next 1 to 2 days once his wheezing and coughing improves   Objective: Vitals last 24 hrs: Vitals:   03/31/21 1502 03/31/21 2017 04/01/21 0454 04/01/21 0908  BP:  129/87 115/67   Pulse:  (!) 108 86   Resp:  18 18   Temp:  98.3 F (36.8 C) 97.9 F (36.6 C)   TempSrc:  Oral Oral   SpO2: 91% 95% 98% 96%  Weight:      Height:       Weight change:  Physical Examination: General exam: AA OX3, hard of hearing,older than stated  age, weak appearing. HEENT:Oral mucosa moist, Ear/Nose WNL grossly, dentition normal. Respiratory system: bilaterally expiratory wheezing with coughing spell-but much improved air entry , no use of accessory muscle Cardiovascular system: S1 & S2 +, No JVD,. Gastrointestinal system: Abdomen soft,NT,ND, BS+ Nervous System:Alert, awake, moving extremities and grossly nonfocal Extremities: edema neg,distal peripheral pulses palpable.  Skin: No rashes,no icterus. MSK: Normal muscle bulk,tone, power   Medications reviewed: Scheduled Meds:  albuterol  2.5 mg Nebulization Once   azithromycin  500 mg Oral Daily   dextromethorphan-guaiFENesin  1 tablet Oral BID   enoxaparin (LOVENOX) injection  40 mg Subcutaneous Q24H   famotidine  10 mg Oral BID   fluticasone  1 spray Each Nare Daily   gabapentin  300 mg Oral TID   insulin aspart  0-15 Units Subcutaneous TID WC   insulin aspart  10 Units Subcutaneous TID WC   insulin glargine-yfgn  46 Units Subcutaneous Daily   ipratropium-albuterol  3 mL Nebulization TID   loratadine  10 mg Oral Daily   methylPREDNISolone (SOLU-MEDROL) injection  20 mg Intravenous Once   [START ON 04/02/2021] methylPREDNISolone (SOLU-MEDROL) injection  40 mg Intravenous Daily   metoprolol tartrate  12.5 mg Oral BID   tamsulosin  0.4 mg Oral Daily   Continuous Infusions:      Diet Order             Diet heart healthy/carb modified Room service appropriate? Yes; Fluid consistency: Thin  Diet effective now                            Intake/Output Summary (Last 24 hours) at 04/01/2021 1134 Last data filed at 04/01/2021 0900 Gross per 24 hour  Intake 940 ml  Output 1150 ml  Net -210 ml   Net IO Since Admission: -1,620 mL [04/01/21 1134]  Wt Readings from Last 3 Encounters:  03/29/21 71.1 kg  02/28/21 72 kg  01/17/21 72.1 kg     Unresulted Labs (From admission, onward)     Start     Ordered   04/05/21 0500  Creatinine, serum  (enoxaparin (LOVENOX)     CrCl >/= 30 ml/min)  Weekly,   R     Comments: while on enoxaparin therapy    03/29/21 1527          Data Reviewed: I have personally reviewed following labs and imaging studies CBC: Recent Labs  Lab 03/29/21 1212 03/30/21 0455  WBC 9.0 5.3  NEUTROABS 6.5 4.2  HGB 12.8* 12.9*  HCT 38.0* 38.7*  MCV 90.0 92.4  PLT 183 500   Basic Metabolic Panel: Recent Labs  Lab 03/29/21 1212 03/30/21 0455 03/31/21 0510  NA 134* 136 136  K 3.7 4.2 4.5  CL 105 107 107  CO2 22 21* 20*  GLUCOSE 115* 246* 181*  BUN '14 15 21  '$ CREATININE 0.96 0.91 1.00  CALCIUM 8.6* 8.6* 8.9  MG 2.3  --   --   PHOS 3.5  --   --    GFR: Estimated Creatinine Clearance: 43.4 mL/min (by C-G formula based on SCr of 1 mg/dL). Liver Function Tests: Recent Labs  Lab 03/29/21 1212 03/30/21 0455  AST 19 16  ALT 16 15  ALKPHOS 68 62  BILITOT 0.9 0.4  PROT 6.5 6.5  ALBUMIN 4.0 3.7   No results for input(s): LIPASE, AMYLASE in the last 168 hours. No results  for input(s): AMMONIA in the last 168 hours. Coagulation Profile: No results for input(s): INR, PROTIME in the last 168 hours. Cardiac Enzymes: No results for input(s): CKTOTAL, CKMB, CKMBINDEX, TROPONINI in the last 168 hours. BNP (last 3 results) No results for input(s): PROBNP in the last 8760 hours. HbA1C: Recent Labs    03/29/21 1212  HGBA1C 7.5*   CBG: Recent Labs  Lab 03/31/21 0800 03/31/21 1214 03/31/21 1635 03/31/21 2015 04/01/21 0731  GLUCAP 150* 218* 157* 233* 126*   Lipid Profile: No results for input(s): CHOL, HDL, LDLCALC, TRIG, CHOLHDL, LDLDIRECT in the last 72 hours. Thyroid Function Tests: No results for input(s): TSH, T4TOTAL, FREET4, T3FREE, THYROIDAB in the last 72 hours. Anemia Panel: No results for input(s): VITAMINB12, FOLATE, FERRITIN, TIBC, IRON, RETICCTPCT in the last 72 hours. Sepsis Labs: Recent Labs  Lab 03/29/21 1230  PROCALCITON <0.10    Recent Results (from the past 240 hour(s))  Resp Panel by  RT-PCR (Flu A&B, Covid) Nasopharyngeal Swab     Status: None   Collection Time: 03/29/21 12:30 PM   Specimen: Nasopharyngeal Swab; Nasopharyngeal(NP) swabs in vial transport medium  Result Value Ref Range Status   SARS Coronavirus 2 by RT PCR NEGATIVE NEGATIVE Final    Comment: (NOTE) SARS-CoV-2 target nucleic acids are NOT DETECTED.  The SARS-CoV-2 RNA is generally detectable in upper respiratory specimens during the acute phase of infection. The lowest concentration of SARS-CoV-2 viral copies this assay can detect is 138 copies/mL. A negative result does not preclude SARS-Cov-2 infection and should not be used as the sole basis for treatment or other patient management decisions. A negative result may occur with  improper specimen collection/handling, submission of specimen other than nasopharyngeal swab, presence of viral mutation(s) within the areas targeted by this assay, and inadequate number of viral copies(<138 copies/mL). A negative result must be combined with clinical observations, patient history, and epidemiological information. The expected result is Negative.  Fact Sheet for Patients:  EntrepreneurPulse.com.au  Fact Sheet for Healthcare Providers:  IncredibleEmployment.be  This test is no t yet approved or cleared by the [place] FDA and  has been authorized for detection and/or diagnosis of SARS-CoV-2 by FDA under an Emergency Use Authorization (EUA). This EUA will remain  in effect (meaning this test can be used) for the duration of the COVID-19 declaration under Section 564(b)(1) of the Act, 21 U.S.C.section 360bbb-3(b)(1), unless the authorization is terminated  or revoked sooner.       Influenza A by PCR NEGATIVE NEGATIVE Final   Influenza B by PCR NEGATIVE NEGATIVE Final    Comment: (NOTE) The Xpert Xpress SARS-CoV-2/FLU/RSV plus assay is intended as an aid in the diagnosis of influenza from Nasopharyngeal swab  specimens and should not be used as a sole basis for treatment. Nasal washings and aspirates are unacceptable for Xpert Xpress SARS-CoV-2/FLU/RSV testing.  Fact Sheet for Patients: EntrepreneurPulse.com.au  Fact Sheet for Healthcare Providers: IncredibleEmployment.be  This test is not yet approved or cleared by the [place] FDA and has been authorized for detection and/or diagnosis of SARS-CoV-2 by FDA under an Emergency Use Authorization (EUA). This EUA will remain in effect (meaning this test can be used) for the duration of the COVID-19 declaration under Section 564(b)(1) of the Act, 21 U.S.C. section 360bbb-3(b)(1), unless the authorization is terminated or revoked.  Performed at [facility], [place] [address], [place], [place] [address]   Respiratory (~20 pathogens) panel by PCR     Status: Abnormal  Collection Time: 03/30/21  8:55 AM   Specimen: Nasopharyngeal Swab; Respiratory  Result Value Ref Range Status   Adenovirus NOT DETECTED NOT DETECTED Final   Coronavirus 229E NOT DETECTED NOT DETECTED Final    Comment: (NOTE) The Coronavirus on the Respiratory Panel, DOES NOT test for the novel  Coronavirus (2019 nCoV)    Coronavirus HKU1 NOT DETECTED NOT DETECTED Final   Coronavirus NL63 NOT DETECTED NOT DETECTED Final   Coronavirus OC43 NOT DETECTED NOT DETECTED Final   Metapneumovirus DETECTED (A) NOT DETECTED Final   Rhinovirus / Enterovirus NOT DETECTED NOT DETECTED Final   Influenza A NOT DETECTED NOT DETECTED Final   Influenza B NOT DETECTED NOT DETECTED Final   Parainfluenza Virus 1 NOT DETECTED NOT DETECTED Final   Parainfluenza Virus 2 NOT DETECTED NOT DETECTED Final   Parainfluenza Virus 3 NOT DETECTED NOT DETECTED Final   Parainfluenza Virus 4 NOT DETECTED NOT DETECTED Final   Respiratory Syncytial Virus NOT DETECTED NOT DETECTED Final   Bordetella pertussis NOT DETECTED NOT DETECTED Final   Bordetella  Parapertussis NOT DETECTED NOT DETECTED Final   Chlamydophila pneumoniae NOT DETECTED NOT DETECTED Final   Mycoplasma pneumoniae NOT DETECTED NOT DETECTED Final    Comment: Performed at [facility], [address], [place], [place] [address]    Antimicrobials: Anti-infectives (From admission, onward)    Start     Dose/Rate Route Frequency Ordered Stop   03/31/21 1130  azithromycin (ZITHROMAX) tablet 500 mg        500 mg Oral Daily 03/31/21 1032 04/03/21 0959   03/30/21 1500  cefTRIAXone (ROCEPHIN) 1 g in sodium chloride 0.9 % 100 mL IVPB  Status:  Discontinued        1 g 200 mL/hr over 30 Minutes Intravenous Every 24 hours 03/29/21 1527 03/31/21 1032   03/30/21 1500  azithromycin (ZITHROMAX) 500 mg in sodium chloride 0.9 % 250 mL IVPB  Status:  Discontinued        500 mg 250 mL/hr over 60 Minutes Intravenous Every 24 hours 03/29/21 1527 03/31/21 1032   03/29/21 1445  cefTRIAXone (ROCEPHIN) 2 g in sodium chloride 0.9 % 100 mL IVPB        2 g 200 mL/hr over 30 Minutes Intravenous  Once 03/29/21 1440 03/29/21 1535   03/29/21 1445  azithromycin (ZITHROMAX) 500 mg in sodium chloride 0.9 % 250 mL IVPB        500 mg 250 mL/hr over 60 Minutes Intravenous  Once 03/29/21 1440 03/29/21 1613      Culture/Microbiology    Component Value Date/Time   SDES BLOOD RIGHT FOREARM 07/10/2018 1200   SPECREQUEST  07/10/2018 1200    BOTTLES DRAWN AEROBIC AND ANAEROBIC Blood Culture adequate volume   CULT  07/10/2018 1200    NO GROWTH 5 DAYS Performed at [facility], [place] [address] [place], [place] [address]    REPTSTATUS 07/15/2018 FINAL 07/10/2018 1200    Other culture-see note  Radiology Studies: No results found.   LOS: 3 days   Antonieta Pert, MD [facility]  04/01/2021, 11:34 AM

## 2021-04-02 ENCOUNTER — Inpatient Hospital Stay (HOSPITAL_COMMUNITY): Payer: Medicare Other

## 2021-04-02 LAB — GLUCOSE, CAPILLARY
Glucose-Capillary: 214 mg/dL — ABNORMAL HIGH (ref 70–99)
Glucose-Capillary: 205 mg/dL — ABNORMAL HIGH (ref 70–99)
Glucose-Capillary: 168 mg/dL — ABNORMAL HIGH (ref 70–99)

## 2021-04-02 LAB — TROPONIN I (HIGH SENSITIVITY)
Troponin I (High Sensitivity): 4 ng/L (ref <18)
Troponin I (High Sensitivity): 4 ng/L (ref <18)

## 2021-04-02 MED ORDER — INSULIN ASPART 100 UNIT/ML IJ SOLN: 12.0000 [IU] | Freq: Three times a day (TID) | INTRAMUSCULAR | Status: DC

## 2021-04-02 MED ORDER — INSULIN GLARGINE-YFGN 100 UNIT/ML ~~LOC~~ SOLN
6.0000 [IU] | SUBCUTANEOUS | Status: AC
Start: 2021-04-02 — End: 2021-04-02
  Administered 2021-04-02: 6 [IU] via SUBCUTANEOUS
  Filled 2021-04-02: qty 0.06

## 2021-04-02 MED ORDER — INSULIN ASPART 100 UNIT/ML IJ SOLN
10.0000 [IU] | Freq: Three times a day (TID) | INTRAMUSCULAR | Status: DC
  Administered 2021-04-02 – 2021-04-03 (×4): 10 [IU] via SUBCUTANEOUS

## 2021-04-02 MED ORDER — INSULIN GLARGINE-YFGN 100 UNIT/ML ~~LOC~~ SOLN
52.0000 [IU] | Freq: Every day | SUBCUTANEOUS | Status: DC
  Administered 2021-04-03: 52 [IU] via SUBCUTANEOUS
  Filled 2021-04-02: qty 0.52

## 2021-04-02 NOTE — Progress Notes (Signed)
Patient continues to have  expiratory wheezing and have been tachycardic with exertion. ?

## 2021-04-02 NOTE — Progress Notes (Signed)
PROGRESS NOTE Micheal [name]  YKZ:[id number] DOB: 09/13/1928 DOA: 03/29/2021 PCP: [name], MD   Brief Narrative/Hospital Course: 88 y.o. male with allergic rhinitis, carpal tunnel syndrome, cervical spondylosis, cervicalgia, GERD, diverticulosis, BPH, prostate CA, insomnia, internal hemorrhoids, IBS, macular degeneration, neurogenic bladder, neuropathy NOS, orthostatic hypotension, osteoarthrosis, hyperlipidemia, paroxysmal SVT, restless syndrome, incomplete RBBB, retinal detachment, hypertension, type 2 diabetes presented with fever, sore throat, productive cough with yellow sputum dyspnea/wheezing fatigue malaise and body aches since Monday. In the ED low-grade temp 98.2, rest of vital stable Labs showed WBC count 9 stable electrolytes COVID-19 PCR negative chest x-ray chronic coarse interstitial marking at lung bases, Patient was placed on antibiotics due to SIRS and admitted.  He has been having worsening wheezing cough sore throat, respiratory virus panel came back with metapneumovirus antibiotic de-escalated continue on steroids bronchodilators as he remains symptomatic.    Subjective: Seen this am Earlier was anxious, coughing and wheezy, and had chest pain across the chest. Now feels better, on 2l [place] for comfort  Assessment and Plan: * Acute bronchiolitis due to human metapneumovirus Acute bronchitis/bronchiolitis due to metapneumovirus Ongoing wheezing: SIRS on admission due to viral infection: no significant pneumonia on x-ray, procalcitonin negative This morning has bilateral wheezing diminished breath sounds chest soreness from coughing-EKG nonischemic.  Repeat chest x-ray no acute finding. Continue with iv Solu-Medrol,bronchodilators, antitussives, antitussives, Claritin/Flonase, mucolytic's, supportive care and supplemental oxygen for comfort.  SIRS (systemic inflammatory response syndrome) ([place]) See #1  DM (diabetes mellitus), type 2 with peripheral vascular  complications ([place]) With uncontrolled hyperglycemia, A1c 7.5.  In the setting of acute illness and steroid.  Unfortunately has ongoing steroid need, increase Lantus from 46 to 52 units, cont insulin 10 u premeal,SSI.Monitor closely while on systemic steroid.  Cannot tolerate p.o. steroid we will plan to taper off IV steroids inpatient Recent Labs  Lab 03/29/21 1212 03/29/21 1733 03/31/21 2015 04/01/21 0731 04/01/21 1159 04/01/21 1712 04/01/21 2124  GLUCAP  --    < > 233* 126* 162* 116* 262*  HGBA1C 7.5*  --   --   --   --   --   --    < > = values in this interval not displayed.    Normocytic anemia Stable hb.  Hypocalcemia Cal at 8.6, outpatient follow-up  BPH (benign prostatic hyperplasia) Stable, continue Flomax  Esophageal reflux Continue Pepcid.  Essential hypertension BP well controlled continue metoprolol  Hyperlipidemia Not on medication follow-up with PCP  DVT prophylaxis: enoxaparin (LOVENOX) injection 40 mg Start: 03/29/21 1600 Code Status:   Code Status: Full Code Family Communication: plan of care discussed with patient  at bedside.  Disposition: Currently not medically stable for discharge. Status is: Inpatient. Remains inpatient appropriate because: Ongoing management of his respiratory illness.  He lives at home with his 73 year old wife.  Hopefully discharge home in next 1 to 2 days once his wheezing and coughing improves   Objective: Vitals last 24 hrs: Vitals:   04/02/21 0711 04/02/21 0839 04/02/21 0900 04/02/21 0930  BP: 122/69     Pulse: 83     Resp: 18     Temp:      TempSrc: Oral     SpO2: 97% 95% 97% 100%  Weight:      Height:       Weight change:   Physical Examination: General exam: AAO, elderly older than stated age, weak appearing. HEENT:Oral mucosa moist, Ear/Nose WNL grossly, dentition normal. Respiratory system: bilaterally diminished bs w/ diffuse wheezing Cardiovascular system:  S1 & S2 +, No JVD,. Gastrointestinal system:  Abdomen soft,NT,ND,BS+ Nervous System:Alert, awake, moving extremities and grossly nonfocal Extremities: LE ankle edema none,distal peripheral pulses palpable.  Skin: No rashes,no icterus. MSK: Normal muscle bulk,tone, power.   Medications reviewed: Scheduled Meds:  albuterol  2.5 mg Nebulization Once   dextromethorphan-guaiFENesin  1 tablet Oral BID   enoxaparin (LOVENOX) injection  40 mg Subcutaneous Q24H   famotidine  10 mg Oral BID   fluticasone  1 spray Each Nare Daily   gabapentin  300 mg Oral TID   insulin aspart  0-15 Units Subcutaneous TID WC   insulin aspart  12 Units Subcutaneous TID WC   [START ON 04/03/2021] insulin glargine-yfgn  52 Units Subcutaneous Daily   insulin glargine-yfgn  6 Units Subcutaneous STAT   ipratropium-albuterol  3 mL Nebulization TID   loratadine  10 mg Oral Daily   methylPREDNISolone (SOLU-MEDROL) injection  40 mg Intravenous Daily   metoprolol tartrate  12.5 mg Oral BID   tamsulosin  0.4 mg Oral Daily   Continuous Infusions:      Diet Order             Diet heart healthy/carb modified Room service appropriate? Yes; Fluid consistency: Thin  Diet effective now                            Intake/Output Summary (Last 24 hours) at 04/02/2021 1107 Last data filed at 04/02/2021 1023 Gross per 24 hour  Intake 932 ml  Output 1100 ml  Net -168 ml   Net IO Since Admission: -1,788 mL [04/02/21 1107]  Wt Readings from Last 3 Encounters:  03/29/21 71.1 kg  02/28/21 72 kg  01/17/21 72.1 kg     Unresulted Labs (From admission, onward)     Start     Ordered   04/05/21 0500  Creatinine, serum  (enoxaparin (LOVENOX)    CrCl >/= 30 ml/min)  Weekly,   R     Comments: while on enoxaparin therapy    03/29/21 1527          Data Reviewed: I have personally reviewed following labs and imaging studies CBC: Recent Labs  Lab 03/29/21 1212 03/30/21 0455  WBC 9.0 5.3  NEUTROABS 6.5 4.2  HGB 12.8* 12.9*  HCT 38.0* 38.7*  MCV 90.0 92.4   PLT 183 937   Basic Metabolic Panel: Recent Labs  Lab 03/29/21 1212 03/30/21 0455 03/31/21 0510  NA 134* 136 136  K 3.7 4.2 4.5  CL 105 107 107  CO2 22 21* 20*  GLUCOSE 115* 246* 181*  BUN '14 15 21  '$ CREATININE 0.96 0.91 1.00  CALCIUM 8.6* 8.6* 8.9  MG 2.3  --   --   PHOS 3.5  --   --    GFR: Estimated Creatinine Clearance: 43.4 mL/min (by C-G formula based on SCr of 1 mg/dL). Liver Function Tests: Recent Labs  Lab 03/29/21 1212 03/30/21 0455  AST 19 16  ALT 16 15  ALKPHOS 68 62  BILITOT 0.9 0.4  PROT 6.5 6.5  ALBUMIN 4.0 3.7   No results for input(s): LIPASE, AMYLASE in the last 168 hours. No results for input(s): AMMONIA in the last 168 hours. Coagulation Profile: No results for input(s): INR, PROTIME in the last 168 hours. Cardiac Enzymes: No results for input(s): CKTOTAL, CKMB, CKMBINDEX, TROPONINI in the last 168 hours. BNP (last 3 results) No results for input(s): PROBNP in the last 8760 hours.  HbA1C: No results for input(s): HGBA1C in the last 72 hours.  CBG: Recent Labs  Lab 03/31/21 2015 04/01/21 0731 04/01/21 1159 04/01/21 1712 04/01/21 2124  GLUCAP 233* 126* 162* 116* 262*   Lipid Profile: No results for input(s): CHOL, HDL, LDLCALC, TRIG, CHOLHDL, LDLDIRECT in the last 72 hours. Thyroid Function Tests: No results for input(s): TSH, T4TOTAL, FREET4, T3FREE, THYROIDAB in the last 72 hours. Anemia Panel: No results for input(s): VITAMINB12, FOLATE, FERRITIN, TIBC, IRON, RETICCTPCT in the last 72 hours. Sepsis Labs: Recent Labs  Lab 03/29/21 1230  PROCALCITON <0.10    Recent Results (from the past 240 hour(s))  Resp Panel by RT-PCR (Flu A&B, Covid) Nasopharyngeal Swab     Status: None   Collection Time: 03/29/21 12:30 PM   Specimen: Nasopharyngeal Swab; Nasopharyngeal(NP) swabs in vial transport medium  Result Value Ref Range Status   SARS Coronavirus 2 by RT PCR NEGATIVE NEGATIVE Final    Comment: (NOTE) SARS-CoV-2 target nucleic  acids are NOT DETECTED.  The SARS-CoV-2 RNA is generally detectable in upper respiratory specimens during the acute phase of infection. The lowest concentration of SARS-CoV-2 viral copies this assay can detect is 138 copies/mL. A negative result does not preclude SARS-Cov-2 infection and should not be used as the sole basis for treatment or other patient management decisions. A negative result may occur with  improper specimen collection/handling, submission of specimen other than nasopharyngeal swab, presence of viral mutation(s) within the areas targeted by this assay, and inadequate number of viral copies(<138 copies/mL). A negative result must be combined with clinical observations, patient history, and epidemiological information. The expected result is Negative.  Fact Sheet for Patients:  EntrepreneurPulse.com.au  Fact Sheet for Healthcare Providers:  IncredibleEmployment.be  This test is no t yet approved or cleared by the [place] FDA and  has been authorized for detection and/or diagnosis of SARS-CoV-2 by FDA under an Emergency Use Authorization (EUA). This EUA will remain  in effect (meaning this test can be used) for the duration of the COVID-19 declaration under Section 564(b)(1) of the Act, 21 U.S.C.section 360bbb-3(b)(1), unless the authorization is terminated  or revoked sooner.       Influenza A by PCR NEGATIVE NEGATIVE Final   Influenza B by PCR NEGATIVE NEGATIVE Final    Comment: (NOTE) The Xpert Xpress SARS-CoV-2/FLU/RSV plus assay is intended as an aid in the diagnosis of influenza from Nasopharyngeal swab specimens and should not be used as a sole basis for treatment. Nasal washings and aspirates are unacceptable for Xpert Xpress SARS-CoV-2/FLU/RSV testing.  Fact Sheet for Patients: EntrepreneurPulse.com.au  Fact Sheet for Healthcare Providers: IncredibleEmployment.be  This  test is not yet approved or cleared by the [place] FDA and has been authorized for detection and/or diagnosis of SARS-CoV-2 by FDA under an Emergency Use Authorization (EUA). This EUA will remain in effect (meaning this test can be used) for the duration of the COVID-19 declaration under Section 564(b)(1) of the Act, 21 U.S.C. section 360bbb-3(b)(1), unless the authorization is terminated or revoked.  Performed at [facility], [place] [address], [place], [place] [address]   Respiratory (~20 pathogens) panel by PCR     Status: Abnormal   Collection Time: 03/30/21  8:55 AM   Specimen: Nasopharyngeal Swab; Respiratory  Result Value Ref Range Status   Adenovirus NOT DETECTED NOT DETECTED Final   Coronavirus 229E NOT DETECTED NOT DETECTED Final    Comment: (NOTE) The Coronavirus on the Respiratory Panel, DOES NOT test for the  novel  Coronavirus (2019 nCoV)    Coronavirus HKU1 NOT DETECTED NOT DETECTED Final   Coronavirus NL63 NOT DETECTED NOT DETECTED Final   Coronavirus OC43 NOT DETECTED NOT DETECTED Final   Metapneumovirus DETECTED (A) NOT DETECTED Final   Rhinovirus / Enterovirus NOT DETECTED NOT DETECTED Final   Influenza A NOT DETECTED NOT DETECTED Final   Influenza B NOT DETECTED NOT DETECTED Final   Parainfluenza Virus 1 NOT DETECTED NOT DETECTED Final   Parainfluenza Virus 2 NOT DETECTED NOT DETECTED Final   Parainfluenza Virus 3 NOT DETECTED NOT DETECTED Final   Parainfluenza Virus 4 NOT DETECTED NOT DETECTED Final   Respiratory Syncytial Virus NOT DETECTED NOT DETECTED Final   Bordetella pertussis NOT DETECTED NOT DETECTED Final   Bordetella Parapertussis NOT DETECTED NOT DETECTED Final   Chlamydophila pneumoniae NOT DETECTED NOT DETECTED Final   Mycoplasma pneumoniae NOT DETECTED NOT DETECTED Final    Comment: Performed at [facility], [place] [address], [place], [place] [address]    Antimicrobials: Anti-infectives (From admission, onward)     Start     Dose/Rate Route Frequency Ordered Stop   03/31/21 1130  azithromycin (ZITHROMAX) tablet 500 mg        500 mg Oral Daily 03/31/21 1032 04/02/21 1017   03/30/21 1500  cefTRIAXone (ROCEPHIN) 1 g in sodium chloride 0.9 % 100 mL IVPB  Status:  Discontinued        1 g 200 mL/hr over 30 Minutes Intravenous Every 24 hours 03/29/21 1527 03/31/21 1032   03/30/21 1500  azithromycin (ZITHROMAX) 500 mg in sodium chloride 0.9 % 250 mL IVPB  Status:  Discontinued        500 mg 250 mL/hr over 60 Minutes Intravenous Every 24 hours 03/29/21 1527 03/31/21 1032   03/29/21 1445  cefTRIAXone (ROCEPHIN) 2 g in sodium chloride 0.9 % 100 mL IVPB        2 g 200 mL/hr over 30 Minutes Intravenous  Once 03/29/21 1440 03/29/21 1535   03/29/21 1445  azithromycin (ZITHROMAX) 500 mg in sodium chloride 0.9 % 250 mL IVPB        500 mg 250 mL/hr over 60 Minutes Intravenous  Once 03/29/21 1440 03/29/21 1613      Culture/Microbiology    Component Value Date/Time   SDES BLOOD RIGHT FOREARM 07/10/2018 1200   SPECREQUEST  07/10/2018 1200    BOTTLES DRAWN AEROBIC AND ANAEROBIC Blood Culture adequate volume   CULT  07/10/2018 1200    NO GROWTH 5 DAYS Performed at [facility], [place] [address] [place], [place] [address]    REPTSTATUS 07/15/2018 FINAL 07/10/2018 1200    Other culture-see note  Radiology Studies: DG Chest Port 1 View  Result Date: 04/02/2021 CLINICAL DATA:  Shortness of breath and cough. EXAM: PORTABLE CHEST 1 VIEW COMPARISON:  03/29/2021 FINDINGS: The heart size and mediastinal contours are within normal limits. Aortic atherosclerotic calcification noted. Both lungs are clear. The visualized skeletal structures are unremarkable. IMPRESSION: No active disease. Electronically Signed   By: Marlaine Hind M.D.   On: 04/02/2021 10:08     LOS: 4 days   Antonieta Pert, MD [facility]  04/02/2021, 11:07 AM

## 2021-04-02 NOTE — Progress Notes (Signed)
Pt resting in bed. Breathing and cough have improved. Continue to monitor.  ?

## 2021-04-02 NOTE — Progress Notes (Signed)
Pt stating to nurse that he is "having trouble breathing" and that his "chest feels tight". Check VS to note WNL. )2 at 95% RA. HR 104. Pt expiratory wheezing. Writer made contact with MD regarding this information. EKG done. MD noted results when he arrived to floor. O2 at 1 L/min applied via [place] for pt comfort. Chest xray was ordered. Results stating that "lungs are clear". New orders received and noted. Tylenol PO given as ordered PRN, as well as Cepacol lozenge for complain of sore, scratchy throat. Close monitoring continues. ?

## 2021-04-03 LAB — GLUCOSE, CAPILLARY
Glucose-Capillary: 216 mg/dL — ABNORMAL HIGH (ref 70–99)
Glucose-Capillary: 139 mg/dL — ABNORMAL HIGH (ref 70–99)

## 2021-04-03 MED ORDER — INSULIN GLARGINE-YFGN 100 UNIT/ML ~~LOC~~ SOLN: 46.0000 [IU] | Freq: Every day | SUBCUTANEOUS | Status: DC

## 2021-04-03 MED ORDER — HYDROCODONE BIT-HOMATROP MBR 5-1.5 MG/5ML PO SOLN: 5.0000 mL | Freq: Four times a day (QID) | ORAL | Status: DC | PRN

## 2021-04-03 MED ORDER — FLUTICASONE PROPIONATE 50 MCG/ACT NA SUSP: 1.0000 | Freq: Every day | NASAL | 0 refills | Status: AC

## 2021-04-03 MED ORDER — DM-GUAIFENESIN ER 30-600 MG PO TB12
1.0000 | ORAL_TABLET | Freq: Two times a day (BID) | ORAL | 0 refills | Status: AC
Start: 2021-04-03 — End: 2021-04-10

## 2021-04-03 NOTE — Discharge Summary (Signed)
Physician Discharge Summary  Micheal [name] WUJ:[id number] DOB: May 02, 1928 DOA: 03/29/2021  PCP: [name], MD  Admit date: 03/29/2021 Discharge date: 04/03/2021 Recommendations for Outpatient Follow-up:  Follow up with PCP in 1 weeks-call for appointment Please obtain BMP/CBC in one week  Discharge Dispo: home Discharge Condition: Stable Code Status:   Code Status: Full Code Diet recommendation:  Diet Order             Diet heart healthy/carb modified Room service appropriate? Yes; Fluid consistency: Thin  Diet effective now                    Brief/Interim Summary: 88 y.o. male with allergic rhinitis, carpal tunnel syndrome, cervical spondylosis, cervicalgia, GERD, diverticulosis, BPH, prostate CA, insomnia, internal hemorrhoids, IBS, macular degeneration, neurogenic bladder, neuropathy NOS, orthostatic hypotension, osteoarthrosis, hyperlipidemia, paroxysmal SVT, restless syndrome, incomplete RBBB, retinal detachment, hypertension, type 2 diabetes presented with fever, sore throat, productive cough with yellow sputum dyspnea/wheezing fatigue malaise and body aches since Monday. In the ED low-grade temp 98.2, rest of vital stable Labs showed WBC count 9 stable electrolytes COVID-19 PCR negative chest x-ray chronic coarse interstitial marking at lung bases, Patient was placed on antibiotics due to SIRS and admitted.  He has been having worsening wheezing cough sore throat, respiratory virus panel came back with metapneumovirus antibiotic de-escalated continue on steroids bronchodilators as he remains symptomatic.  Patient completed antibiotic course, he was given IV steroids for ongoing wheezing cough and at this time he has clinically improved, we will mobilize him make sure he is doing well without shortness of breath wheezing or significant tachycardia or hypoxia on ambulation before discharge.   He did ambulate and did well w/o tachycardia shortness of breath coughing spells or  hypoxia, he feels well enough to go home today  Discharge Diagnoses:  * Acute bronchiolitis due to human metapneumovirus Acute bronchitis/bronchiolitis due to metapneumovirus Ongoing wheezing: SIRS on admission due to viral infection: no significant pneumonia on x-ray, procalcitonin negative This morning has bilateral wheezing diminished breath sounds chest soreness from coughing-EKG nonischemic.  Repeat chest x-ray no acute finding. Treated with IV steroids bronchodilators antitussives Claritin/Flonase, mucolytic and he is nwo improved and ready for hone. Completed iv steroid, does not tolerate po steroid, cont other supportive measures at home  DM (diabetes mellitus), type 2 with peripheral vascular complications ([name]) With uncontrolled hyperglycemia, A1c 7.5.  In the setting of acute illness and steroid.  Unfortunately has ongoing steroid need-insulin was increased for hyperglycemia due to steroid and he will go back on his home regimen , off steroid now Recent Labs  Lab 03/29/21 1212 03/29/21 1733 04/02/21 0722 04/02/21 1110 04/02/21 1547 04/02/21 2120 04/03/21 0736  GLUCAP  --    < > 216* 214* 205* 168* 139*  HGBA1C 7.5*  --   --   --   --   --   --    < > = values in this interval not displayed.    Normocytic anemia Stable hb.  Hypocalcemia Cal stable, outpatient follow-up  BPH (benign prostatic hyperplasia) Stable, continue Flomax  Esophageal reflux Continue Pepcid.  Essential hypertension BP well controlled continue metoprolol  Hyperlipidemia Not on medication follow-up with PCP  Consults: none Subjective: Feels well, feels ready for home today No new complaints, cough is better.  Discharge Exam: Vitals:   04/03/21 0932 04/03/21 1330  BP:  140/79  Pulse:  98  Resp:  20  Temp:  (!) 97.3 F (36.3 C)  SpO2: 98% 100%   General: Pt is alert, awake, not in acute distress Cardiovascular: RRR, S1/S2 +, no rubs, no gallops Respiratory: CTA bilaterally, no  wheezing, no rhonchi Abdominal: Soft, NT, ND, bowel sounds + Extremities: no edema, no cyanosis  Discharge Instructions  Discharge Instructions     Discharge instructions   Complete by: As directed    Please call call MD or return to ER for similar or worsening recurring problem that brought you to hospital or if any fever,nausea/vomiting,abdominal pain, uncontrolled pain, chest pain,  shortness of breath or any other alarming symptoms.  Please follow-up your doctor as instructed in a week time and call the office for appointment.  Please avoid alcohol, smoking, or any other illicit substance and maintain healthy habits including taking your regular medications as prescribed.  You were cared for by a hospitalist during your hospital stay. If you have any questions about your discharge medications or the care you received while you were in the hospital after you are discharged, you can call the unit and ask to speak with the hospitalist on call if the hospitalist that took care of you is not available.  Once you are discharged, your primary care physician will handle any further medical issues. Please note that NO REFILLS for any discharge medications will be authorized once you are discharged, as it is imperative that you return to your primary care physician (or establish a relationship with a primary care physician if you do not have one) for your aftercare needs so that they can reassess your need for medications and monitor your lab values   Increase activity slowly   Complete by: As directed       Allergies as of 04/03/2021       Reactions   Aspirin Other (See Comments)   Bothers stomach, thins blood    Atorvastatin Other (See Comments)   Muscular pain and weakness   Prednisone Other (See Comments)   Upset stomach, can take by shot not by mouth   Simvastatin Other (See Comments)   Muscular pain and weakness   Sulfa Antibiotics Other (See Comments)   Upset stomach    Other  Other (See Comments)   Latex Rash   Metformin Hcl Nausea Only   Voltaren [diclofenac Sodium] Other (See Comments)   Unknown        Medication List     TAKE these medications    acetaminophen 500 MG tablet Commonly known as: TYLENOL Take 500 mg by mouth 3 (three) times daily.   albuterol 108 (90 Base) MCG/ACT inhaler Commonly known as: VENTOLIN HFA Inhale 2 puffs into the lungs every 6 (six) hours as needed for wheezing or shortness of breath.   azelastine 0.1 % nasal spray Commonly known as: ASTELIN Place 1 spray into both nostrils 2 (two) times daily as needed for rhinitis or allergies.   B-D ULTRAFINE III SHORT PEN 31G X 8 MM Misc Generic drug: Insulin Pen Needle Use three times daily for insulin Injections. Dx:E11.51   cholecalciferol 10 MCG (400 UNIT) Tabs tablet Commonly known as: VITAMIN D3 Take 400 Units by mouth daily.   dextromethorphan-guaiFENesin 30-600 MG 12hr tablet Commonly known as: MUCINEX DM Take 1 tablet by mouth 2 (two) times daily for 7 days.   docusate sodium 100 MG capsule Commonly known as: COLACE Take 100 mg by mouth daily as needed for mild constipation or moderate constipation.   famotidine 10 MG chewable tablet Commonly known as: PEPCID AC Chew 10 mg  by mouth daily as needed for heartburn.   fluticasone 50 MCG/ACT nasal spray Commonly known as: FLONASE Place 1 spray into both nostrils daily. Start taking on: April 04, 2021   gabapentin 300 MG capsule Commonly known as: NEURONTIN Take 1 capsule (300 mg total) by mouth 3 (three) times daily.   HumaLOG KwikPen 200 UNIT/ML KwikPen Generic drug: insulin lispro INJECT SUBCUTANEOUSLY 6  UNITS TWICE DAILY AFTER  MEALS What changed:  how much to take when to take this additional instructions   Lantus SoloStar 100 UNIT/ML Solostar Pen Generic drug: insulin glargine Inject Subcutaneously 46 units in the morning.   loperamide 1 MG/5ML solution Commonly known as: IMODIUM Take 1 mg by  mouth as needed for diarrhea or loose stools.   loratadine 10 MG tablet Commonly known as: CLARITIN Take 10 mg by mouth daily.   metoprolol tartrate 25 MG tablet Commonly known as: LOPRESSOR Take 0.5 tablets (12.5 mg total) by mouth 2 (two) times daily.   mupirocin ointment 2 % Commonly known as: Bactroban One application to affected area on right leg. What changed:  how much to take how to take this when to take this reasons to take this additional instructions   nitroGLYCERIN 0.2 mg/hr patch Commonly known as: NITRODUR - Dosed in mg/24 hr Place 1 patch (0.2 mg total) onto the skin daily. For Shoulder pain. Remove old patch.   Probiotic-10 Chew Chew 1 each by mouth daily.   tamsulosin 0.4 MG Caps capsule Commonly known as: FLOMAX TAKE 1 CAPSULE BY MOUTH  DAILY FOR PROSTATE What changed:  how much to take when to take this reasons to take this additional instructions   trolamine salicylate 10 % cream Commonly known as: ASPERCREME Apply 1 application topically as needed for muscle pain.        Follow-up Information     [name], MD Follow up in 1 week(s).   Specialties: Family Medicine, Emergency Medicine Contact information: [place] [address] [phone]                Allergies  Allergen Reactions   Aspirin Other (See Comments)    Bothers stomach, thins blood    Atorvastatin Other (See Comments)    Muscular pain and weakness   Prednisone Other (See Comments)    Upset stomach, can take by shot not by mouth   Simvastatin Other (See Comments)    Muscular pain and weakness   Sulfa Antibiotics Other (See Comments)    Upset stomach    Other Other (See Comments)   Latex Rash   Metformin Hcl Nausea Only   Voltaren [Diclofenac Sodium] Other (See Comments)    Unknown    The results of significant diagnostics from this hospitalization (including imaging, microbiology, ancillary and laboratory) are listed below for  reference.    Microbiology: Recent Results (from the past 240 hour(s))  Resp Panel by RT-PCR (Flu A&B, Covid) Nasopharyngeal Swab     Status: None   Collection Time: 03/29/21 12:30 PM   Specimen: Nasopharyngeal Swab; Nasopharyngeal(NP) swabs in vial transport medium  Result Value Ref Range Status   SARS Coronavirus 2 by RT PCR NEGATIVE NEGATIVE Final    Comment: (NOTE) SARS-CoV-2 target nucleic acids are NOT DETECTED.  The SARS-CoV-2 RNA is generally detectable in upper respiratory specimens during the acute phase of infection. The lowest concentration of SARS-CoV-2 viral copies this assay can detect is 138 copies/mL. A negative result does not preclude SARS-Cov-2 infection and should  not be used as the sole basis for treatment or other patient management decisions. A negative result may occur with  improper specimen collection/handling, submission of specimen other than nasopharyngeal swab, presence of viral mutation(s) within the areas targeted by this assay, and inadequate number of viral copies(<138 copies/mL). A negative result must be combined with clinical observations, patient history, and epidemiological information. The expected result is Negative.  Fact Sheet for Patients:  EntrepreneurPulse.com.au  Fact Sheet for Healthcare Providers:  IncredibleEmployment.be  This test is no t yet approved or cleared by the [place] FDA and  has been authorized for detection and/or diagnosis of SARS-CoV-2 by FDA under an Emergency Use Authorization (EUA). This EUA will remain  in effect (meaning this test can be used) for the duration of the COVID-19 declaration under Section 564(b)(1) of the Act, 21 U.S.C.section 360bbb-3(b)(1), unless the authorization is terminated  or revoked sooner.       Influenza A by PCR NEGATIVE NEGATIVE Final   Influenza B by PCR NEGATIVE NEGATIVE Final    Comment: (NOTE) The Xpert Xpress SARS-CoV-2/FLU/RSV  plus assay is intended as an aid in the diagnosis of influenza from Nasopharyngeal swab specimens and should not be used as a sole basis for treatment. Nasal washings and aspirates are unacceptable for Xpert Xpress SARS-CoV-2/FLU/RSV testing.  Fact Sheet for Patients: EntrepreneurPulse.com.au  Fact Sheet for Healthcare Providers: IncredibleEmployment.be  This test is not yet approved or cleared by the [place] FDA and has been authorized for detection and/or diagnosis of SARS-CoV-2 by FDA under an Emergency Use Authorization (EUA). This EUA will remain in effect (meaning this test can be used) for the duration of the COVID-19 declaration under Section 564(b)(1) of the Act, 21 U.S.C. section 360bbb-3(b)(1), unless the authorization is terminated or revoked.  Performed at [facility], [place] [address], [place], [place] [address]   Respiratory (~20 pathogens) panel by PCR     Status: Abnormal   Collection Time: 03/30/21  8:55 AM   Specimen: Nasopharyngeal Swab; Respiratory  Result Value Ref Range Status   Adenovirus NOT DETECTED NOT DETECTED Final   Coronavirus 229E NOT DETECTED NOT DETECTED Final    Comment: (NOTE) The Coronavirus on the Respiratory Panel, DOES NOT test for the novel  Coronavirus (2019 nCoV)    Coronavirus HKU1 NOT DETECTED NOT DETECTED Final   Coronavirus NL63 NOT DETECTED NOT DETECTED Final   Coronavirus OC43 NOT DETECTED NOT DETECTED Final   Metapneumovirus DETECTED (A) NOT DETECTED Final   Rhinovirus / Enterovirus NOT DETECTED NOT DETECTED Final   Influenza A NOT DETECTED NOT DETECTED Final   Influenza B NOT DETECTED NOT DETECTED Final   Parainfluenza Virus 1 NOT DETECTED NOT DETECTED Final   Parainfluenza Virus 2 NOT DETECTED NOT DETECTED Final   Parainfluenza Virus 3 NOT DETECTED NOT DETECTED Final   Parainfluenza Virus 4 NOT DETECTED NOT DETECTED Final   Respiratory Syncytial Virus NOT DETECTED  NOT DETECTED Final   Bordetella pertussis NOT DETECTED NOT DETECTED Final   Bordetella Parapertussis NOT DETECTED NOT DETECTED Final   Chlamydophila pneumoniae NOT DETECTED NOT DETECTED Final   Mycoplasma pneumoniae NOT DETECTED NOT DETECTED Final    Comment: Performed at [facility], [address], [place], [place] [address]    Procedures/Studies: DG Chest 2 View  Result Date: 03/29/2021 CLINICAL DATA:  Cough, fever EXAM: CHEST - 2 VIEW COMPARISON:  07/10/2018, 06/23/2020 FINDINGS: The heart size and mediastinal contours are within normal limits. Aortic atherosclerosis. Chronically  coarsened interstitial markings within the lung bases. No focal airspace consolidation, pleural effusion, or pneumothorax. The visualized skeletal structures are unremarkable. IMPRESSION: No acute cardiopulmonary abnormality. Electronically Signed   By: Davina Poke D.O.   On: 03/29/2021 12:48   DG Chest Port 1 View  Result Date: 04/02/2021 CLINICAL DATA:  Shortness of breath and cough. EXAM: PORTABLE CHEST 1 VIEW COMPARISON:  03/29/2021 FINDINGS: The heart size and mediastinal contours are within normal limits. Aortic atherosclerotic calcification noted. Both lungs are clear. The visualized skeletal structures are unremarkable. IMPRESSION: No active disease. Electronically Signed   By: Marlaine Hind M.D.   On: 04/02/2021 10:08    Labs: BNP (last 3 results) No results for input(s): BNP in the last 8760 hours. Basic Metabolic Panel: Recent Labs  Lab 03/29/21 1212 03/30/21 0455 03/31/21 0510  NA 134* 136 136  K 3.7 4.2 4.5  CL 105 107 107  CO2 22 21* 20*  GLUCOSE 115* 246* 181*  BUN '14 15 21  '$ CREATININE 0.96 0.91 1.00  CALCIUM 8.6* 8.6* 8.9  MG 2.3  --   --   PHOS 3.5  --   --    Liver Function Tests: Recent Labs  Lab 03/29/21 1212 03/30/21 0455  AST 19 16  ALT 16 15  ALKPHOS 68 62  BILITOT 0.9 0.4  PROT 6.5 6.5  ALBUMIN 4.0 3.7   No results for input(s): LIPASE, AMYLASE in the last  168 hours. No results for input(s): AMMONIA in the last 168 hours. CBC: Recent Labs  Lab 03/29/21 1212 03/30/21 0455  WBC 9.0 5.3  NEUTROABS 6.5 4.2  HGB 12.8* 12.9*  HCT 38.0* 38.7*  MCV 90.0 92.4  PLT 183 155   Cardiac Enzymes: No results for input(s): CKTOTAL, CKMB, CKMBINDEX, TROPONINI in the last 168 hours. BNP: Invalid input(s): POCBNP CBG: Recent Labs  Lab 04/02/21 0722 04/02/21 1110 04/02/21 1547 04/02/21 2120 04/03/21 0736  GLUCAP 216* 214* 205* 168* 139*   D-Dimer No results for input(s): DDIMER in the last 72 hours. Hgb A1c No results for input(s): HGBA1C in the last 72 hours. Lipid Profile No results for input(s): CHOL, HDL, LDLCALC, TRIG, CHOLHDL, LDLDIRECT in the last 72 hours. Thyroid function studies No results for input(s): TSH, T4TOTAL, T3FREE, THYROIDAB in the last 72 hours.  Invalid input(s): FREET3 Anemia work up No results for input(s): VITAMINB12, FOLATE, FERRITIN, TIBC, IRON, RETICCTPCT in the last 72 hours. Urinalysis    Component Value Date/Time   COLORURINE YELLOW 03/29/2021 1212   APPEARANCEUR CLEAR 03/29/2021 1212   LABSPEC 1.009 03/29/2021 1212   PHURINE 7.0 03/29/2021 1212   GLUCOSEU NEGATIVE 03/29/2021 1212   HGBUR NEGATIVE 03/29/2021 1212   BILIRUBINUR NEGATIVE 03/29/2021 1212   KETONESUR NEGATIVE 03/29/2021 1212   PROTEINUR NEGATIVE 03/29/2021 1212   UROBILINOGEN 0.2 11/06/2013 1621   NITRITE NEGATIVE 03/29/2021 1212   LEUKOCYTESUR NEGATIVE 03/29/2021 1212   Sepsis Labs Invalid input(s): PROCALCITONIN,  WBC,  LACTICIDVEN Microbiology Recent Results (from the past 240 hour(s))  Resp Panel by RT-PCR (Flu A&B, Covid) Nasopharyngeal Swab     Status: None   Collection Time: 03/29/21 12:30 PM   Specimen: Nasopharyngeal Swab; Nasopharyngeal(NP) swabs in vial transport medium  Result Value Ref Range Status   SARS Coronavirus 2 by RT PCR NEGATIVE NEGATIVE Final    Comment: (NOTE) SARS-CoV-2 target nucleic acids are NOT  DETECTED.  The SARS-CoV-2 RNA is generally detectable in upper respiratory specimens during the acute phase of infection. The lowest concentration of SARS-CoV-2  viral copies this assay can detect is 138 copies/mL. A negative result does not preclude SARS-Cov-2 infection and should not be used as the sole basis for treatment or other patient management decisions. A negative result may occur with  improper specimen collection/handling, submission of specimen other than nasopharyngeal swab, presence of viral mutation(s) within the areas targeted by this assay, and inadequate number of viral copies(<138 copies/mL). A negative result must be combined with clinical observations, patient history, and epidemiological information. The expected result is Negative.  Fact Sheet for Patients:  EntrepreneurPulse.com.au  Fact Sheet for Healthcare Providers:  IncredibleEmployment.be  This test is no t yet approved or cleared by the [place] FDA and  has been authorized for detection and/or diagnosis of SARS-CoV-2 by FDA under an Emergency Use Authorization (EUA). This EUA will remain  in effect (meaning this test can be used) for the duration of the COVID-19 declaration under Section 564(b)(1) of the Act, 21 U.S.C.section 360bbb-3(b)(1), unless the authorization is terminated  or revoked sooner.       Influenza A by PCR NEGATIVE NEGATIVE Final   Influenza B by PCR NEGATIVE NEGATIVE Final    Comment: (NOTE) The Xpert Xpress SARS-CoV-2/FLU/RSV plus assay is intended as an aid in the diagnosis of influenza from Nasopharyngeal swab specimens and should not be used as a sole basis for treatment. Nasal washings and aspirates are unacceptable for Xpert Xpress SARS-CoV-2/FLU/RSV testing.  Fact Sheet for Patients: EntrepreneurPulse.com.au  Fact Sheet for Healthcare Providers: IncredibleEmployment.be  This test is not yet  approved or cleared by the [place] FDA and has been authorized for detection and/or diagnosis of SARS-CoV-2 by FDA under an Emergency Use Authorization (EUA). This EUA will remain in effect (meaning this test can be used) for the duration of the COVID-19 declaration under Section 564(b)(1) of the Act, 21 U.S.C. section 360bbb-3(b)(1), unless the authorization is terminated or revoked.  Performed at [facility], [place] [address], [place], [place] [address]   Respiratory (~20 pathogens) panel by PCR     Status: Abnormal   Collection Time: 03/30/21  8:55 AM   Specimen: Nasopharyngeal Swab; Respiratory  Result Value Ref Range Status   Adenovirus NOT DETECTED NOT DETECTED Final   Coronavirus 229E NOT DETECTED NOT DETECTED Final    Comment: (NOTE) The Coronavirus on the Respiratory Panel, DOES NOT test for the novel  Coronavirus (2019 nCoV)    Coronavirus HKU1 NOT DETECTED NOT DETECTED Final   Coronavirus NL63 NOT DETECTED NOT DETECTED Final   Coronavirus OC43 NOT DETECTED NOT DETECTED Final   Metapneumovirus DETECTED (A) NOT DETECTED Final   Rhinovirus / Enterovirus NOT DETECTED NOT DETECTED Final   Influenza A NOT DETECTED NOT DETECTED Final   Influenza B NOT DETECTED NOT DETECTED Final   Parainfluenza Virus 1 NOT DETECTED NOT DETECTED Final   Parainfluenza Virus 2 NOT DETECTED NOT DETECTED Final   Parainfluenza Virus 3 NOT DETECTED NOT DETECTED Final   Parainfluenza Virus 4 NOT DETECTED NOT DETECTED Final   Respiratory Syncytial Virus NOT DETECTED NOT DETECTED Final   Bordetella pertussis NOT DETECTED NOT DETECTED Final   Bordetella Parapertussis NOT DETECTED NOT DETECTED Final   Chlamydophila pneumoniae NOT DETECTED NOT DETECTED Final   Mycoplasma pneumoniae NOT DETECTED NOT DETECTED Final    Comment: Performed at [facility], [address] [place], [place] [address]     Time coordinating discharge: 25 minutes  SIGNED: Antonieta Pert, MD  [facility] 04/03/2021, 2:06 PM  If 7PM-7AM, please contact night-coverage [email or web address]

## 2021-04-04 ENCOUNTER — Telehealth: Payer: Self-pay | Admitting: *Deleted

## 2021-04-04 LAB — GLUCOSE, CAPILLARY: Glucose-Capillary: 164 mg/dL — ABNORMAL HIGH (ref 70–99)

## 2021-04-04 NOTE — Telephone Encounter (Signed)
Transition Care Management Follow-up Telephone Call ?Date of discharge and from where: 04/03/2021 [place] ?How have you been since you were released from the hospital? Doing a lot better ?Any questions or concerns? No ? ?Items Reviewed: ?Did the pt receive and understand the discharge instructions provided? Yes  ?Medications obtained and verified? Yes  ?Other? No  ?Any new allergies since your discharge? No  ?Dietary orders reviewed? Yes ?Do you have support at home? Yes  ? ?Home Care and Equipment/Supplies: ?Were home health services ordered? no ?If so, what is the name of the agency? na  ?Has the agency set up a time to come to the patient's home? not applicable ?Were any new equipment or medical supplies ordered?  No ?What is the name of the medical supply agency? na ?Were you able to get the supplies/equipment? not applicable ?Do you have any questions related to the use of the equipment or supplies? No ? ?Functional Questionnaire: (I = Independent and D = Dependent) ?ADLs: I ? ?Bathing/Dressing- I ? ?Meal Prep- D ? ?Eating- I ? ?Maintaining continence- I ? ?Transferring/Ambulation- I uses Cane ? ?Managing Meds- I ? ?Follow up appointments reviewed: ? ?PCP Hospital f/u appt confirmed? Yes  Scheduled to see Dr. Sabra Heck 04/12/2021 ?[facility] f/u appt confirmed? No   ?Are transportation arrangements needed? No  ?If their condition worsens, is the pt aware to call PCP or go to the Emergency Dept.? Yes ?Was the patient provided with contact information for the PCP's office or ED? Yes ?Was to pt encouraged to call back with questions or concerns? Yes  ?

## 2021-04-12 ENCOUNTER — Other Ambulatory Visit: Payer: Self-pay

## 2021-04-12 ENCOUNTER — Ambulatory Visit (INDEPENDENT_AMBULATORY_CARE_PROVIDER_SITE_OTHER): Payer: Medicare Other | Admitting: Family Medicine

## 2021-04-12 ENCOUNTER — Encounter: Payer: Self-pay | Admitting: Family Medicine

## 2021-04-12 ENCOUNTER — Other Ambulatory Visit: Payer: Self-pay | Admitting: *Deleted

## 2021-04-12 VITALS — BP 120/72 | HR 97 | Temp 96.2°F | Ht 66.0 in | Wt 160.6 lb

## 2021-04-12 DIAGNOSIS — R911 Solitary pulmonary nodule: Secondary | ICD-10-CM | POA: Diagnosis not present

## 2021-04-12 DIAGNOSIS — J211 Acute bronchiolitis due to human metapneumovirus: Secondary | ICD-10-CM

## 2021-04-12 DIAGNOSIS — J219 Acute bronchiolitis, unspecified: Secondary | ICD-10-CM | POA: Diagnosis not present

## 2021-04-12 MED ORDER — IPRATROPIUM-ALBUTEROL 0.5-2.5 (3) MG/3ML IN SOLN: 3.0000 mL | Freq: Four times a day (QID) | RESPIRATORY_TRACT | Status: DC | PRN

## 2021-04-12 MED ORDER — IPRATROPIUM-ALBUTEROL 0.5-2.5 (3) MG/3ML IN SOLN: 3.0000 mL | Freq: Four times a day (QID) | RESPIRATORY_TRACT | 1 refills | Status: DC | PRN

## 2021-04-12 NOTE — Telephone Encounter (Signed)
Received fax from [facility] stating that the Ipratropium-Albuterol (DuoNeb) 0.5-2.5 is on BACKORDER.  ? ?Requesting alternative to be sent to pharmacy.  ?Please Advise.  ?

## 2021-04-12 NOTE — Progress Notes (Signed)
Provider:  Jacalyn Lefevre, MD  Careteam: Patient Care Team: Frederica Kuster, MD as PCP - General (Family Medicine) Beverely Low, MD as Consulting Physician (Orthopedic Surgery) Beverely Pace, MD as Referring Physician (Ophthalmology) Barron Alvine, MD (Inactive) as Consulting Physician (Urology)  PLACE OF SERVICE:  [facility]  Advanced Directive information    Allergies  Allergen Reactions   Aspirin Other (See Comments)    Bothers stomach, thins blood    Atorvastatin Other (See Comments)    Muscular pain and weakness   Prednisone Other (See Comments)    Upset stomach, can take by shot not by mouth   Simvastatin Other (See Comments)    Muscular pain and weakness   Sulfa Antibiotics Other (See Comments)    Upset stomach    Other Other (See Comments)   Latex Rash   Metformin Hcl Nausea Only   Voltaren [Diclofenac Sodium] Other (See Comments)    Unknown    Chief Complaint  Patient presents with   Hospitalization Follow-up    Patient presents today for a hospital follow-up from 3/8-3/13/23 at [facility]. Patient was diagnosis with Acute bronchiolitis due to human metapneumovirus.     HPI: Patient is a 88 y.o. male .  Was hospitalized recently for respiratory infection from 03/29/2021 to 04/03/2021.  Discharge diagnosis included acute bronchiolitis due to human Meta pneumo virus.  He is discharged on Astelin and albuterol inhaler.  He endorses still having some shortness of breath with exertion and mild cough.  Review of Systems:  Review of Systems  Respiratory:  Positive for cough, shortness of breath and wheezing.   Cardiovascular: Negative.    Past Medical History:  Diagnosis Date   Allergic rhinitis, cause unspecified    Carpal tunnel syndrome    Cervical spondylosis without myelopathy    Cervical spondylosis without myelopathy    Cervicalgia    Diverticulosis of colon (without mention of hemorrhage)    Esophageal reflux    Hypertrophy  of prostate with urinary obstruction and other lower urinary tract symptoms (LUTS)    Insomnia, unspecified    Internal hemorrhoids without mention of complication    Irritable bowel syndrome    Macular degeneration (senile) of retina, unspecified    Malignant neoplasm of prostate (HCC)    Neurogenic bladder, NOS    Neuropathy    Nonspecific (abnormal) findings on radiological and other examination of gastrointestinal tract    Orthostatic hypotension    Osteoarthrosis, unspecified whether generalized or localized, unspecified site    Other and unspecified hyperlipidemia    Other malaise and fatigue    Other specified disorder of rectum and anus    Radiation Proctitus   Paroxysmal supraventricular tachycardia (HCC)    Restless legs syndrome (RLS)    Retinal detachment with retinal defect, unspecified    Right bundle branch block    Incomplete   Spermatocele    Right   Trigger finger (acquired)    Type II or unspecified type diabetes mellitus without mention of complication, not stated as uncontrolled    Type II or unspecified type diabetes mellitus without mention of complication, uncontrolled    Unspecified essential hypertension    patient denies, states he has a "fast heart rate, but no high BP"   Vitamin D deficiency    Past Surgical History:  Procedure Laterality Date   Angiolipoma  1980   Right arm   APPENDECTOMY  1951   CHOLECYSTECTOMY  01/1994   COLONOSCOPY  04/11/2010  COLONOSCOPY W/ POLYPECTOMY  2005   Removed 2 (two) polyps   DUPUYTREN CONTRACTURE RELEASE Bilateral 1989   EYE SURGERY  2006   Left   EYE SURGERY  2006   Retina reattachment, right   EYE SURGERY  02/1994   Cataract surgery, right   EYE SURGERY  01/1995   Cataract surgery, left   KNEE SURGERY  1979   PARATHYROIDECTOMY Left 12/27/2016   Procedure: LEFT INFERIOR PARATHYROIDECTOMY;  Surgeon: Darnell Level, MD;  Location: [facility];  Service: General;  Laterality: Left;   POLYPECTOMY  1955   Vocal cord    RETINAL DETACHMENT SURGERY Right 2005   SHOULDER ARTHROSCOPY Right 03/22/15   [facility]   TENDON RELEASE  01/1997   seven fingers   TRANSURETHRAL RESECTION OF PROSTATE  08/1989   VITRECTOMY Left 04/23/2013   [facility]   Social History:   reports that he quit smoking about 72 years ago. His smoking use included cigarettes. He has never used smokeless tobacco. He reports that he does not drink alcohol and does not use drugs.  Family History  Problem Relation Age of Onset   Depression Mother    Diabetes Mother    Mental illness Mother        OCD   Cerebral aneurysm Father        age 44   Hypertension Sister    Diabetes Daughter    Hyperlipidemia Daughter    Breast cancer Daughter    Breast cancer Daughter    Hyperparathyroidism Neg Hx     Medications: Patient's Medications  New Prescriptions   No medications on file  Previous Medications   ACETAMINOPHEN (TYLENOL) 500 MG TABLET    Take 500 mg by mouth 3 (three) times daily.   ALBUTEROL (VENTOLIN HFA) 108 (90 BASE) MCG/ACT INHALER    Inhale 2 puffs into the lungs every 6 (six) hours as needed for wheezing or shortness of breath.   AZELASTINE (ASTELIN) 0.1 % NASAL SPRAY    Place 1 spray into both nostrils 2 (two) times daily as needed for rhinitis or allergies.   CHOLECALCIFEROL (VITAMIN D) 400 UNITS TABS TABLET    Take 400 Units by mouth daily.   DOCUSATE SODIUM (COLACE) 100 MG CAPSULE    Take 100 mg by mouth daily as needed for mild constipation or moderate constipation.   FAMOTIDINE (PEPCID AC) 10 MG CHEWABLE TABLET    Chew 10 mg by mouth daily as needed for heartburn.   FLUTICASONE (FLONASE) 50 MCG/ACT NASAL SPRAY    Place 1 spray into both nostrils daily.   GABAPENTIN (NEURONTIN) 300 MG CAPSULE    Take 1 capsule (300 mg total) by mouth 3 (three) times daily.   INSULIN GLARGINE (LANTUS SOLOSTAR) 100 UNIT/ML SOLOSTAR PEN    Inject Subcutaneously 46 units in the morning.   INSULIN LISPRO (HUMALOG KWIKPEN) 200 UNIT/ML KWIKPEN     INJECT SUBCUTANEOUSLY 6  UNITS TWICE DAILY AFTER  MEALS   INSULIN PEN NEEDLE (B-D ULTRAFINE III SHORT PEN) 31G X 8 MM MISC    Use three times daily for insulin Injections. Dx:E11.51   LOPERAMIDE (IMODIUM) 1 MG/5ML SOLUTION    Take 1 mg by mouth as needed for diarrhea or loose stools.   LORATADINE (CLARITIN) 10 MG TABLET    Take 10 mg by mouth daily.   METOPROLOL TARTRATE (LOPRESSOR) 25 MG TABLET    Take 0.5 tablets (12.5 mg total) by mouth 2 (two) times daily.   MUPIROCIN OINTMENT (BACTROBAN) 2 %  One application to affected area on right leg.   PROBIOTIC PRODUCT (PROBIOTIC-10) CHEW    Chew 1 each by mouth daily.    TAMSULOSIN (FLOMAX) 0.4 MG CAPS CAPSULE    TAKE 1 CAPSULE BY MOUTH  DAILY FOR PROSTATE   TROLAMINE SALICYLATE (ASPERCREME) 10 % CREAM    Apply 1 application topically as needed for muscle pain.  Modified Medications   No medications on file  Discontinued Medications   NITROGLYCERIN (NITRODUR - DOSED IN MG/24 HR) 0.2 MG/HR PATCH    Place 1 patch (0.2 mg total) onto the skin daily. For Shoulder pain. Remove old patch.    Physical Exam:  Vitals:   04/12/21 0903  BP: 120/72  Pulse: 97  Temp: (!) 96.2 F (35.7 C)  SpO2: 98%  Weight: 160 lb 9.6 oz (72.8 kg)  Height: 5\' 6"  (1.676 m)   Body mass index is 25.92 kg/m. Wt Readings from Last 3 Encounters:  04/12/21 160 lb 9.6 oz (72.8 kg)  03/29/21 156 lb 12.8 oz (71.1 kg)  02/28/21 158 lb 12.8 oz (72 kg)    Physical Exam Vitals and nursing note reviewed.  Constitutional:      Appearance: Normal appearance.  Cardiovascular:     Rate and Rhythm: Regular rhythm. Tachycardia present.  Pulmonary:     Effort: Pulmonary effort is normal.     Breath sounds: Wheezing present. No rales.     Comments: Patient was walked to and exercised in a hallway and O2 sats remained normal Neurological:     General: No focal deficit present.     Mental Status: He is alert and oriented to person, place, and time.    Labs reviewed: Basic  Metabolic Panel: Recent Labs    03/29/21 1212 03/30/21 0455 03/31/21 0510  NA 134* 136 136  K 3.7 4.2 4.5  CL 105 107 107  CO2 22 21* 20*  GLUCOSE 115* 246* 181*  BUN 14 15 21   CREATININE 0.96 0.91 1.00  CALCIUM 8.6* 8.6* 8.9  MG 2.3  --   --   PHOS 3.5  --   --    Liver Function Tests: Recent Labs    09/20/20 1122 03/29/21 1212 03/30/21 0455  AST 14 19 16   ALT 11 16 15   ALKPHOS  --  68 62  BILITOT 0.5 0.9 0.4  PROT 6.5 6.5 6.5  ALBUMIN  --  4.0 3.7   No results for input(s): LIPASE, AMYLASE in the last 8760 hours. No results for input(s): AMMONIA in the last 8760 hours. CBC: Recent Labs    03/29/21 1212 03/30/21 0455  WBC 9.0 5.3  NEUTROABS 6.5 4.2  HGB 12.8* 12.9*  HCT 38.0* 38.7*  MCV 90.0 92.4  PLT 183 155   Lipid Panel: Recent Labs    09/20/20 1122  CHOL 245*  HDL 40  LDLCALC 154*  TRIG 334*  CHOLHDL 6.1*   TSH: No results for input(s): TSH in the last 8760 hours. A1C: Lab Results  Component Value Date   HGBA1C 7.5 (H) 03/29/2021     Assessment/Plan  1. Bronchiolitis We will order respiratory therapy neb treatments with DuoNeb for persistent symptoms   3. Acute bronchiolitis due to human metapneumovirus Add mucolytic.  Have encouraged walking as tolerated   Jacalyn Lefevre, MD [facility] [phone]

## 2021-04-12 NOTE — Addendum Note (Signed)
Addended by: Dan Maker on: 04/12/2021 03:12 PM ? ? Modules accepted: Orders ? ?

## 2021-04-13 LAB — CBC WITH DIFFERENTIAL/PLATELET
Absolute Monocytes: 994 cells/uL — ABNORMAL HIGH (ref 200–950)
Basophils Absolute: 74 cells/uL (ref 0–200)
Basophils Relative: 0.8 %
Eosinophils Absolute: 230 cells/uL (ref 15–500)
Eosinophils Relative: 2.5 %
HCT: 40.1 % (ref 38.5–50.0)
Hemoglobin: 13.6 g/dL (ref 13.2–17.1)
Lymphs Abs: 1003 cells/uL (ref 850–3900)
MCH: 30.8 pg (ref 27.0–33.0)
MCHC: 33.9 g/dL (ref 32.0–36.0)
MCV: 90.7 fL (ref 80.0–100.0)
MPV: 12.6 fL — ABNORMAL HIGH (ref 7.5–12.5)
Monocytes Relative: 10.8 %
Neutro Abs: 6900 cells/uL (ref 1500–7800)
Neutrophils Relative %: 75 %
Platelets: 214 10*3/uL (ref 140–400)
RBC: 4.42 10*6/uL (ref 4.20–5.80)
RDW: 12.3 % (ref 11.0–15.0)
Total Lymphocyte: 10.9 %
WBC: 9.2 10*3/uL (ref 3.8–10.8)

## 2021-04-13 LAB — BASIC METABOLIC PANEL WITH GFR
BUN: 13 mg/dL (ref 7–25)
CO2: 27 mmol/L (ref 20–32)
Calcium: 9.4 mg/dL (ref 8.6–10.3)
Chloride: 105 mmol/L (ref 98–110)
Creat: 0.95 mg/dL (ref 0.70–1.22)
Glucose, Bld: 247 mg/dL — ABNORMAL HIGH (ref 65–139)
Potassium: 4.6 mmol/L (ref 3.5–5.3)
Sodium: 139 mmol/L (ref 135–146)
eGFR: 76 mL/min/{1.73_m2} (ref >=60)

## 2021-04-13 MED ORDER — ALBUTEROL SULFATE 0.63 MG/3ML IN NEBU: 1.0000 | INHALATION_SOLUTION | Freq: Four times a day (QID) | RESPIRATORY_TRACT | 1 refills | Status: DC | PRN

## 2021-04-13 MED ORDER — ALBUTEROL SULFATE 0.63 MG/3ML IN NEBU
1.0000 | INHALATION_SOLUTION | Freq: Four times a day (QID) | RESPIRATORY_TRACT | 1 refills | Status: DC | PRN
Start: 2021-04-13 — End: 2021-04-13

## 2021-04-13 NOTE — Telephone Encounter (Signed)
[facility] called stating they need rx resumbitted with a diagnosis  ?

## 2021-04-13 NOTE — Telephone Encounter (Signed)
Micheal Honour, MD  You 13 hours ago (10:37 PM)  ? ?We will have to use albuterol solution qid, prn   ? ? ? ?Pended Rx due to HIGH ALERT Warning, sent to [facility] for approval due to Dr. Sabra Heck out of office.  ?

## 2021-04-13 NOTE — Addendum Note (Signed)
Addended by: Logan Bores on: 04/13/2021 01:23 PM ? ? Modules accepted: Orders ? ?

## 2021-04-22 ENCOUNTER — Other Ambulatory Visit: Payer: Self-pay | Admitting: Nurse Practitioner

## 2021-04-24 NOTE — Telephone Encounter (Signed)
Pharmacy requesting new diagnosis code for insurance purposes. Medication pended and sent to Sherrie Mustache, NP for new diagnosis code and approval ?

## 2021-04-25 ENCOUNTER — Other Ambulatory Visit: Payer: Self-pay | Admitting: *Deleted

## 2021-04-25 MED ORDER — ALBUTEROL SULFATE 0.63 MG/3ML IN NEBU: INHALATION_SOLUTION | RESPIRATORY_TRACT | 1 refills | Status: AC

## 2021-04-25 NOTE — Telephone Encounter (Signed)
Received fax from pharmacy Needing Clarification on Medication.  ?Pended Rx and sent to Dr. Sabra Heck for approval due to [place] Warning.  ? ? ? ? ?

## 2021-05-08 ENCOUNTER — Other Ambulatory Visit: Payer: Self-pay | Admitting: Family Medicine

## 2021-05-08 DIAGNOSIS — R062 Wheezing: Secondary | ICD-10-CM

## 2021-05-08 NOTE — Telephone Encounter (Signed)
Patient has request refill on medication "Albuterol 162mg". Patient medication last refilled 06/07/2020. Medication pend and sent to JSherrie Mustache NP due to PCP MSabra HeckSLillette Boxer MD not being in office. Medication has High Risk Warnings.  ?

## 2021-05-15 ENCOUNTER — Encounter: Payer: Self-pay | Admitting: Family Medicine

## 2021-05-16 ENCOUNTER — Ambulatory Visit (INDEPENDENT_AMBULATORY_CARE_PROVIDER_SITE_OTHER): Payer: Medicare Other | Admitting: Family Medicine

## 2021-05-16 ENCOUNTER — Encounter: Payer: Self-pay | Admitting: Family Medicine

## 2021-05-16 VITALS — BP 118/70 | HR 77 | Temp 96.9°F | Wt 157.2 lb

## 2021-05-16 DIAGNOSIS — J211 Acute bronchiolitis due to human metapneumovirus: Secondary | ICD-10-CM | POA: Diagnosis not present

## 2021-05-16 DIAGNOSIS — E1151 Type 2 diabetes mellitus with diabetic peripheral angiopathy without gangrene: Secondary | ICD-10-CM

## 2021-05-16 DIAGNOSIS — I10 Essential (primary) hypertension: Secondary | ICD-10-CM

## 2021-05-16 DIAGNOSIS — I700 Atherosclerosis of aorta: Secondary | ICD-10-CM

## 2021-05-16 DIAGNOSIS — G5602 Carpal tunnel syndrome, left upper limb: Secondary | ICD-10-CM | POA: Diagnosis not present

## 2021-05-16 NOTE — Progress Notes (Signed)
? ? ?Provider:  ?Alain Honey, MD ? ?Careteam: ?Patient Care Team: ?[name], MD as PCP - General (Family Medicine) ?Netta Cedars, MD as Consulting Physician (Orthopedic Surgery) ?Jasmine Awe, MD as Referring Physician (Ophthalmology) ?Rana Snare, MD (Inactive) as Consulting Physician (Urology) ? ?PLACE OF SERVICE:  ?[facility]  ?Advanced Directive information ?Does Patient Have a Medical Advance Directive?: Yes, Type of Advance Directive: Out of facility DNR (pink MOST or yellow form);Living will, Pre-existing out of facility DNR order (yellow form or pink MOST form): Pink MOST form placed in chart (order not valid for inpatient use);Yellow form placed in chart (order not valid for inpatient use), Does patient want to make changes to medical advance directive?: No - Patient declined ? ?Allergies  ?Allergen Reactions  ? Aspirin Other (See Comments)  ?  Bothers stomach, thins blood   ? Atorvastatin Other (See Comments)  ?  Muscular pain and weakness  ? Prednisone Other (See Comments)  ?  Upset stomach, can take by shot not by mouth  ? Simvastatin Other (See Comments)  ?  Muscular pain and weakness  ? Sulfa Antibiotics Other (See Comments)  ?  Upset stomach   ? Other Other (See Comments)  ? Latex Rash  ? Metformin Hcl Nausea Only  ? Voltaren [Diclofenac Sodium] Other (See Comments)  ?  Unknown  ? ? ?Chief Complaint  ?Patient presents with  ? Medical Management of Chronic Issues  ?  Patient presents today for a 4 month follow-up.  ? Quality Metric Gaps  ?  Zoster & eye exam  ? ? ? ?HPI: Patient is a 88 y.o. male patient is doing much better having by his account fully recovered from bronchiolitis for which she was hospitalized in March. ?We spent some time talking today about his diabetes.  Seems like there are big fluctuations in his sugar from 400 to less than 100.  His A1c 1 month ago was 7.5.  I explained that probably more important to avoid the lows and the highs and as long as we can  keep A1c around 7.5 that would be a good goal at his age. ?Still complains of left shoulder pain which is chronic.  He is wearing a nitroglycerin patch and feels like that is helpful.  He continues to walk around his cul-de-sac twice a day.  Uses a cane for ambulation ? ?Review of Systems:  ?Review of Systems  ?Constitutional: Negative.   ?Eyes:  Positive for blurred vision.  ?     Secondary to macular degeneration  ?Respiratory: Negative.    ?Cardiovascular: Negative.   ?Gastrointestinal: Negative.   ?Genitourinary: Negative.   ?Neurological: Negative.   ?Psychiatric/Behavioral: Negative.    ?All other systems reviewed and are negative. ? ?Past Medical History:  ?Diagnosis Date  ? Allergic rhinitis, cause unspecified   ? Carpal tunnel syndrome   ? Cervical spondylosis without myelopathy   ? Cervical spondylosis without myelopathy   ? Cervicalgia   ? Diverticulosis of colon (without mention of hemorrhage)   ? Esophageal reflux   ? Hypertrophy of prostate with urinary obstruction and other lower urinary tract symptoms (LUTS)   ? Insomnia, unspecified   ? Internal hemorrhoids without mention of complication   ? Irritable bowel syndrome   ? Macular degeneration (senile) of retina, unspecified   ? Malignant neoplasm of prostate ([place])   ? Neurogenic bladder, NOS   ? Neuropathy   ? Nonspecific (abnormal) findings on radiological and other examination of gastrointestinal tract   ?  Orthostatic hypotension   ? Osteoarthrosis, unspecified whether generalized or localized, unspecified site   ? Other and unspecified hyperlipidemia   ? Other malaise and fatigue   ? Other specified disorder of rectum and anus   ? Radiation Proctitus  ? Paroxysmal supraventricular tachycardia ([place])   ? Restless legs syndrome (RLS)   ? Retinal detachment with retinal defect, unspecified   ? Right bundle branch block   ? Incomplete  ? Spermatocele   ? Right  ? Trigger finger (acquired)   ? Type II or unspecified type diabetes mellitus without  mention of complication, not stated as uncontrolled   ? Type II or unspecified type diabetes mellitus without mention of complication, uncontrolled   ? Unspecified essential hypertension   ? patient denies, states he has a "fast heart rate, but no high BP"  ? Vitamin D deficiency   ? ?Past Surgical History:  ?Procedure Laterality Date  ? Angiolipoma  1980  ? Right arm  ? APPENDECTOMY  1951  ? CHOLECYSTECTOMY  01/1994  ? COLONOSCOPY  04/11/2010  ? COLONOSCOPY W/ POLYPECTOMY  2005  ? Removed 2 (two) polyps  ? DUPUYTREN CONTRACTURE RELEASE Bilateral 1989  ? EYE SURGERY  2006  ? Left  ? EYE SURGERY  2006  ? Retina reattachment, right  ? EYE SURGERY  02/1994  ? Cataract surgery, right  ? EYE SURGERY  01/1995  ? Cataract surgery, left  ? KNEE SURGERY  1979  ? PARATHYROIDECTOMY Left 12/27/2016  ? Procedure: LEFT INFERIOR PARATHYROIDECTOMY;  Surgeon: Armandina Gemma, MD;  Location: [facility];  Service: General;  Laterality: Left;  ? POLYPECTOMY  1955  ? Vocal cord  ? RETINAL DETACHMENT SURGERY Right 2005  ? SHOULDER ARTHROSCOPY Right 03/22/15  ? [facility]  ? TENDON RELEASE  01/1997  ? seven fingers  ? TRANSURETHRAL RESECTION OF PROSTATE  08/1989  ? VITRECTOMY Left 04/23/2013  ? [facility]  ? ?Social History: ?  reports that he quit smoking about 72 years ago. His smoking use included cigarettes. He has never used smokeless tobacco. He reports that he does not drink alcohol and does not use drugs. ? ?Family History  ?Problem Relation Age of Onset  ? Depression Mother   ? Diabetes Mother   ? Mental illness Mother   ?     OCD  ? Cerebral aneurysm Father   ?     age 27  ? Hypertension Sister   ? Diabetes Daughter   ? Hyperlipidemia Daughter   ? Breast cancer Daughter   ? Breast cancer Daughter   ? Hyperparathyroidism Neg Hx   ? ? ?Medications: ?Patient's Medications  ?New Prescriptions  ? No medications on file  ?Previous Medications  ? ACETAMINOPHEN (TYLENOL) 500 MG TABLET    Take 500 mg by mouth 3 (three) times daily.  ? ALBUTEROL  (ACCUNEB) 0.63 MG/3ML NEBULIZER SOLUTION    USE 1 VIAL IN NEBULIZER EVERY 6 HOURS AS NEEDED FOR  WHEEZING Dx:R06.2  ? ALBUTEROL (VENTOLIN HFA) 108 (90 BASE) MCG/ACT INHALER    INHALE 2 PUFFS BY MOUTH EVERY 6 HOURS AS NEEDED FOR WHEEZING FOR SHORTNESS OF BREATH  ? AZELASTINE (ASTELIN) 0.1 % NASAL SPRAY    Place 1 spray into both nostrils 2 (two) times daily as needed for rhinitis or allergies.  ? CHOLECALCIFEROL (VITAMIN D) 400 UNITS TABS TABLET    Take 400 Units by mouth daily.  ? DOCUSATE SODIUM (COLACE) 100 MG CAPSULE    Take 100 mg by mouth daily  as needed for mild constipation or moderate constipation.  ? FAMOTIDINE (PEPCID AC) 10 MG CHEWABLE TABLET    Chew 10 mg by mouth daily as needed for heartburn.  ? FLUTICASONE (FLONASE) 50 MCG/ACT NASAL SPRAY    Place 1 spray into both nostrils daily.  ? GABAPENTIN (NEURONTIN) 300 MG CAPSULE    Take 1 capsule (300 mg total) by mouth 3 (three) times daily.  ? INSULIN GLARGINE (LANTUS SOLOSTAR) 100 UNIT/ML SOLOSTAR PEN    Inject Subcutaneously 46 units in the morning.  ? INSULIN LISPRO (HUMALOG KWIKPEN) 200 UNIT/ML KWIKPEN    INJECT SUBCUTANEOUSLY 6  UNITS TWICE DAILY AFTER  MEALS  ? INSULIN PEN NEEDLE (B-D ULTRAFINE III SHORT PEN) 31G X 8 MM MISC    Use three times daily for insulin Injections. Dx:E11.51  ? LOPERAMIDE (IMODIUM) 1 MG/5ML SOLUTION    Take 1 mg by mouth as needed for diarrhea or loose stools.  ? LORATADINE (CLARITIN) 10 MG TABLET    Take 10 mg by mouth daily.  ? METOPROLOL TARTRATE (LOPRESSOR) 25 MG TABLET    Take 0.5 tablets (12.5 mg total) by mouth 2 (two) times daily.  ? MUPIROCIN OINTMENT (BACTROBAN) 2 %    One application to affected area on right leg.  ? PROBIOTIC PRODUCT (PROBIOTIC-10) CHEW    Chew 1 each by mouth daily.   ? TAMSULOSIN (FLOMAX) 0.4 MG CAPS CAPSULE    TAKE 1 CAPSULE BY MOUTH  DAILY FOR PROSTATE  ? TROLAMINE SALICYLATE (ASPERCREME) 10 % CREAM    Apply 1 application topically as needed for muscle pain.  ?Modified Medications  ? No  medications on file  ?Discontinued Medications  ? No medications on file  ? ? ?Physical Exam: ? ?Vitals:  ? 05/16/21 1020  ?Weight: 157 lb 3.2 oz (71.3 kg)  ? ?Body mass index is 25.37 kg/m?. ?Wt Readings from Last 3 E

## 2021-05-23 ENCOUNTER — Other Ambulatory Visit: Payer: Self-pay | Admitting: Family Medicine

## 2021-05-23 NOTE — Telephone Encounter (Signed)
Request of medication is not on active medication list. ? ?Called patient to enquire about request and patient states that it was removed because he was advised that he was using them too much, but says he has hence started using them again and would like the refill ? ?Please advise on how to proceed ?

## 2021-05-24 ENCOUNTER — Other Ambulatory Visit: Payer: Self-pay | Admitting: *Deleted

## 2021-05-24 MED ORDER — NITROGLYCERIN 0.2 MG/HR TD PT24: MEDICATED_PATCH | TRANSDERMAL | 0 refills | Status: AC

## 2021-05-24 NOTE — Telephone Encounter (Signed)
Robin with [facility] [place] called and stated that they received a Rx for patient's Nitroglycerin Transdermal Patches for #15 ? ?Stated that they come in a box of #30 and they do not break open the boxes. Needs a Rx stating #30.  ? ?Pended Rx and sent to Dr. Sabra Heck for approval.  ?

## 2021-06-23 ENCOUNTER — Telehealth: Payer: Self-pay | Admitting: *Deleted

## 2021-06-23 NOTE — Telephone Encounter (Signed)
Received fax from [place] stating patient a Continuous Glucose Monitor.   Called patient and they confirmed that they did request this   Filled out form and placed in Dr. Ammie Ferrier folder to review and sign. To be faxed back to Fax: [phone]

## 2021-06-27 ENCOUNTER — Other Ambulatory Visit: Payer: Self-pay | Admitting: Family Medicine

## 2021-06-27 DIAGNOSIS — G5602 Carpal tunnel syndrome, left upper limb: Secondary | ICD-10-CM

## 2021-06-27 DIAGNOSIS — G5622 Lesion of ulnar nerve, left upper limb: Secondary | ICD-10-CM

## 2021-06-29 NOTE — Telephone Encounter (Signed)
Yvone Neu from [facility] called to confirm we received fax for supplies. Confirmed received.

## 2021-06-30 ENCOUNTER — Other Ambulatory Visit: Payer: Self-pay | Admitting: Family Medicine

## 2021-06-30 DIAGNOSIS — G5622 Lesion of ulnar nerve, left upper limb: Secondary | ICD-10-CM

## 2021-06-30 DIAGNOSIS — G5602 Carpal tunnel syndrome, left upper limb: Secondary | ICD-10-CM

## 2021-07-24 ENCOUNTER — Telehealth: Payer: Self-pay

## 2021-07-24 NOTE — Telephone Encounter (Signed)
[facility] called stating they didn't receive any of the faxes that was faxed to them and want the paperwork to be refax to them at [phone] or [phone]. The paperwork was refax to both number today,07/24/21.

## 2021-07-27 ENCOUNTER — Telehealth: Payer: Self-pay

## 2021-07-27 NOTE — Telephone Encounter (Addendum)
Spoke with the patient's with [facility]. She states he does not have monitor, just uses test strips. She also confirmed he take 3 shots a day.  They would like a monitor if possible.

## 2021-07-27 NOTE — Telephone Encounter (Signed)
Diabetic supplies form completed placed on box to be faxed.

## 2021-07-27 NOTE — Telephone Encounter (Signed)
Micheal [name] is attempting to fill out Diabetic supply form. We need to confirm if the patient is currently using a continuous glucose monitor.   Called patient, no answer. Left message on machine to return call.

## 2021-08-22 ENCOUNTER — Ambulatory Visit (INDEPENDENT_AMBULATORY_CARE_PROVIDER_SITE_OTHER): Payer: Medicare Other | Admitting: Family Medicine

## 2021-08-22 ENCOUNTER — Encounter: Payer: Self-pay | Admitting: Family Medicine

## 2021-08-22 VITALS — BP 118/60 | HR 76 | Temp 97.5°F | Ht 66.0 in | Wt 160.4 lb

## 2021-08-22 DIAGNOSIS — H548 Legal blindness, as defined in USA: Secondary | ICD-10-CM

## 2021-08-22 DIAGNOSIS — M75101 Unspecified rotator cuff tear or rupture of right shoulder, not specified as traumatic: Secondary | ICD-10-CM

## 2021-08-22 DIAGNOSIS — I10 Essential (primary) hypertension: Secondary | ICD-10-CM

## 2021-08-22 DIAGNOSIS — E1151 Type 2 diabetes mellitus with diabetic peripheral angiopathy without gangrene: Secondary | ICD-10-CM

## 2021-08-22 DIAGNOSIS — M1712 Unilateral primary osteoarthritis, left knee: Secondary | ICD-10-CM

## 2021-08-22 NOTE — Progress Notes (Signed)
Provider:  Alain Honey, MD  Careteam: Patient Care Team: [name], MD as PCP - General (Family Medicine) Netta Cedars, MD as Consulting Physician (Orthopedic Surgery) Jasmine Awe, MD as Referring Physician (Ophthalmology) Rana Snare, MD (Inactive) as Consulting Physician (Urology)  PLACE OF SERVICE:  [facility]  Advanced Directive information    Allergies  Allergen Reactions   Aspirin Other (See Comments)    Bothers stomach, thins blood    Atorvastatin Other (See Comments)    Muscular pain and weakness   Prednisone Other (See Comments)    Upset stomach, can take by shot not by mouth   Simvastatin Other (See Comments)    Muscular pain and weakness   Sulfa Antibiotics Other (See Comments)    Upset stomach    Other Other (See Comments)   Latex Rash   Metformin Hcl Nausea Only   Voltaren [Diclofenac Sodium] Other (See Comments)    Unknown    Chief Complaint  Patient presents with   Medical Management of Chronic Issues    Patient presents today for a 3 month follow-up   Quality Metric Gaps    Eye and foot exam, zoster, COVID booster # 5     HPI: Patient is a 88 y.o. male .  Patient is here to follow-up chronic problems including rotator cuff syndrome, primarily on left shoulder, osteoarthritis of both knees.  Hypertension, diabetes with peripheral vascular complications and neuropathy  Review of Systems:  Review of Systems  Constitutional: Negative.   Eyes:  Positive for blurred vision.  Respiratory: Negative.    Cardiovascular: Negative.   Genitourinary: Negative.   Musculoskeletal:  Positive for joint pain.  Skin: Negative.   All other systems reviewed and are negative.   Past Medical History:  Diagnosis Date   Allergic rhinitis, cause unspecified    Carpal tunnel syndrome    Cervical spondylosis without myelopathy    Cervical spondylosis without myelopathy    Cervicalgia    Diverticulosis of colon (without mention of  hemorrhage)    Esophageal reflux    Hypertrophy of prostate with urinary obstruction and other lower urinary tract symptoms (LUTS)    Insomnia, unspecified    Internal hemorrhoids without mention of complication    Irritable bowel syndrome    Macular degeneration (senile) of retina, unspecified    Malignant neoplasm of prostate (HCC)    Neurogenic bladder, NOS    Neuropathy    Nonspecific (abnormal) findings on radiological and other examination of gastrointestinal tract    Orthostatic hypotension    Osteoarthrosis, unspecified whether generalized or localized, unspecified site    Other and unspecified hyperlipidemia    Other malaise and fatigue    Other specified disorder of rectum and anus    Radiation Proctitus   Paroxysmal supraventricular tachycardia (HCC)    Restless legs syndrome (RLS)    Retinal detachment with retinal defect, unspecified    Right bundle branch block    Incomplete   Spermatocele    Right   Trigger finger (acquired)    Type II or unspecified type diabetes mellitus without mention of complication, not stated as uncontrolled    Type II or unspecified type diabetes mellitus without mention of complication, uncontrolled    Unspecified essential hypertension    patient denies, states he has a "fast heart rate, but no high BP"   Vitamin D deficiency    Past Surgical History:  Procedure Laterality Date   Angiolipoma  1980   Right arm  APPENDECTOMY  1951   CHOLECYSTECTOMY  01/1994   COLONOSCOPY  04/11/2010   COLONOSCOPY W/ POLYPECTOMY  2005   Removed 2 (two) polyps   DUPUYTREN CONTRACTURE RELEASE Bilateral 1989   EYE SURGERY  2006   Left   EYE SURGERY  2006   Retina reattachment, right   EYE SURGERY  02/1994   Cataract surgery, right   EYE SURGERY  01/1995   Cataract surgery, left   KNEE SURGERY  1979   PARATHYROIDECTOMY Left 12/27/2016   Procedure: LEFT INFERIOR PARATHYROIDECTOMY;  Surgeon: Armandina Gemma, MD;  Location: [facility];  Service: General;   Laterality: Left;   POLYPECTOMY  1955   Vocal cord   RETINAL DETACHMENT SURGERY Right 2005   SHOULDER ARTHROSCOPY Right 03/22/15   [facility]   TENDON RELEASE  01/1997   seven fingers   TRANSURETHRAL RESECTION OF PROSTATE  08/1989   VITRECTOMY Left 04/23/2013   [facility]   Social History:   reports that he quit smoking about 73 years ago. His smoking use included cigarettes. He has never used smokeless tobacco. He reports that he does not drink alcohol and does not use drugs.  Family History  Problem Relation Age of Onset   Depression Mother    Diabetes Mother    Mental illness Mother        OCD   Cerebral aneurysm Father        age 98   Hypertension Sister    Diabetes Daughter    Hyperlipidemia Daughter    Breast cancer Daughter    Breast cancer Daughter    Hyperparathyroidism Neg Hx     Medications: Patient's Medications  New Prescriptions   No medications on file  Previous Medications   ACETAMINOPHEN (TYLENOL) 500 MG TABLET    Take 500 mg by mouth 3 (three) times daily.   ALBUTEROL (ACCUNEB) 0.63 MG/3ML NEBULIZER SOLUTION    USE 1 VIAL IN NEBULIZER EVERY 6 HOURS AS NEEDED FOR  WHEEZING Dx:R06.2   ALBUTEROL (VENTOLIN HFA) 108 (90 BASE) MCG/ACT INHALER    INHALE 2 PUFFS BY MOUTH EVERY 6 HOURS AS NEEDED FOR WHEEZING FOR SHORTNESS OF BREATH   AZELASTINE (ASTELIN) 0.1 % NASAL SPRAY    Place 1 spray into both nostrils 2 (two) times daily as needed for rhinitis or allergies.   CHOLECALCIFEROL (VITAMIN D) 400 UNITS TABS TABLET    Take 400 Units by mouth daily.   DOCUSATE SODIUM (COLACE) 100 MG CAPSULE    Take 100 mg by mouth daily as needed for mild constipation or moderate constipation.   FAMOTIDINE (PEPCID AC) 10 MG CHEWABLE TABLET    Chew 10 mg by mouth daily as needed for heartburn.   FLUTICASONE (FLONASE) 50 MCG/ACT NASAL SPRAY    Place 1 spray into both nostrils daily.   GABAPENTIN (NEURONTIN) 300 MG CAPSULE    TAKE 1 CAPSULE BY MOUTH 3  TIMES DAILY   INSULIN GLARGINE  (LANTUS SOLOSTAR) 100 UNIT/ML SOLOSTAR PEN    Inject Subcutaneously 46 units in the morning.   INSULIN LISPRO (HUMALOG KWIKPEN) 200 UNIT/ML KWIKPEN    INJECT SUBCUTANEOUSLY 6  UNITS TWICE DAILY AFTER  MEALS   INSULIN PEN NEEDLE (B-D ULTRAFINE III SHORT PEN) 31G X 8 MM MISC    Use three times daily for insulin Injections. Dx:E11.51   LOPERAMIDE (IMODIUM) 1 MG/5ML SOLUTION    Take 1 mg by mouth as needed for diarrhea or loose stools.   LORATADINE (CLARITIN) 10 MG TABLET    Take  10 mg by mouth daily.   METOPROLOL TARTRATE (LOPRESSOR) 25 MG TABLET    TAKE ONE-HALF TABLET BY  MOUTH TWICE DAILY   MUPIROCIN OINTMENT (BACTROBAN) 2 %    One application to affected area on right leg.   NITROGLYCERIN (NITRODUR - DOSED IN MG/24 HR) 0.2 MG/HR PATCH    APPLY 1 PATCH TOPICALLY ONCE DAILY FOR  SHOULDER  PAIN.  REMOVE  OLD  PATCH   PROBIOTIC PRODUCT (PROBIOTIC-10) CHEW    Chew 1 each by mouth daily.    TAMSULOSIN (FLOMAX) 0.4 MG CAPS CAPSULE    TAKE 1 CAPSULE BY MOUTH  DAILY FOR PROSTATE   TROLAMINE SALICYLATE (ASPERCREME) 10 % CREAM    Apply 1 application topically as needed for muscle pain.  Modified Medications   No medications on file  Discontinued Medications   No medications on file    Physical Exam:  Vitals:   08/22/21 1011  BP: 118/60  Pulse: 76  Temp: (!) 97.5 F (36.4 C)  SpO2: 97%  Weight: 160 lb 6.4 oz (72.8 kg)  Height: '5\' 6"'$  (1.676 m)   Body mass index is 25.89 kg/m. Wt Readings from Last 3 Encounters:  08/22/21 160 lb 6.4 oz (72.8 kg)  05/16/21 157 lb 3.2 oz (71.3 kg)  04/12/21 160 lb 9.6 oz (72.8 kg)    Physical Exam Vitals and nursing note reviewed.  Constitutional:      Appearance: Normal appearance.  Cardiovascular:     Rate and Rhythm: Normal rate and regular rhythm.  Pulmonary:     Effort: Pulmonary effort is normal.     Breath sounds: Normal breath sounds.  Musculoskeletal:     Comments: Left shoulder: Pain with palpation and decreased range of motion Both knees  have pain and he has received injections per orthopedics but not a candidate for total knee replacement due to age  Neurological:     General: No focal deficit present.     Mental Status: He is alert and oriented to person, place, and time.     Labs reviewed: Basic Metabolic Panel: Recent Labs    03/29/21 1212 03/30/21 0455 03/31/21 0510 04/12/21 0945  NA 134* 136 136 139  K 3.7 4.2 4.5 4.6  CL 105 107 107 105  CO2 22 21* 20* 27  GLUCOSE 115* 246* 181* 247*  BUN '14 15 21 13  '$ CREATININE 0.96 0.91 1.00 0.95  CALCIUM 8.6* 8.6* 8.9 9.4  MG 2.3  --   --   --   PHOS 3.5  --   --   --    Liver Function Tests: Recent Labs    09/20/20 1122 03/29/21 1212 03/30/21 0455  AST '14 19 16  '$ ALT '11 16 15  '$ ALKPHOS  --  68 62  BILITOT 0.5 0.9 0.4  PROT 6.5 6.5 6.5  ALBUMIN  --  4.0 3.7   No results for input(s): "LIPASE", "AMYLASE" in the last 8760 hours. No results for input(s): "AMMONIA" in the last 8760 hours. CBC: Recent Labs    03/29/21 1212 03/30/21 0455 04/12/21 0945  WBC 9.0 5.3 9.2  NEUTROABS 6.5 4.2 6,900  HGB 12.8* 12.9* 13.6  HCT 38.0* 38.7* 40.1  MCV 90.0 92.4 90.7  PLT 183 155 214   Lipid Panel: Recent Labs    09/20/20 1122  CHOL 245*  HDL 40  LDLCALC 154*  TRIG 334*  CHOLHDL 6.1*   TSH: No results for input(s): "TSH" in the last 8760 hours. A1C: Lab Results  Component Value Date   HGBA1C 7.5 (H) 03/29/2021     Assessment/Plan  1. DM (diabetes mellitus), type 2 with peripheral vascular complications [facility]) Insurance company has sent him a freestyle system.  At his age he is unwilling to learn new way to check his sugars.  A1c's have been 7.5 most recently and he is okay to continue with fingersticks.  He uses a combination of rapid acting and basal insulin  2. Essential hypertension Blood pressure today is acceptable at 118/60 on metoprolol.  3. Osteoarthritis of left knee, unspecified osteoarthritis type Receives injections per orthopedics  also uses gabapentin for neuropathy and is upper and lower extremities  4. Rotator cuff syndrome of right shoulder Not a candidate for surgery.  He has been using some nitroglycerin patches which help discomfort  5. Legally blind Patient is extremely low.  He has to position himself right in front of the TV to be able to see cannot still see faces clearly   Alain Honey, MD [place] [phone]

## 2021-08-23 LAB — HEMOGLOBIN A1C
Hgb A1c MFr Bld: 7.7 % of total Hgb — ABNORMAL HIGH (ref <5.7)
Mean Plasma Glucose: 174 mg/dL
eAG (mmol/L): 9.7 mmol/L

## 2021-08-28 ENCOUNTER — Telehealth: Payer: Self-pay

## 2021-08-28 NOTE — Telephone Encounter (Signed)
I tried calling patient to clarify his humalog insulin dosage and frequency.Trying to send order for patient's diabetic supplies. I left message for patient to call office.

## 2021-08-31 NOTE — Telephone Encounter (Signed)
Patient called back and he is taking 10 units in the morning and at bedtime.

## 2021-09-21 ENCOUNTER — Telehealth: Payer: Self-pay | Admitting: *Deleted

## 2021-09-21 NOTE — Telephone Encounter (Signed)
Received fax from [employer] requesting Diabetic Supplies Order form to be filled out and faxed back to Fax:[phone]  Filled out and placed in Dr. Ammie Ferrier folder to review and sign.

## 2021-09-24 ENCOUNTER — Other Ambulatory Visit: Payer: Self-pay | Admitting: Nurse Practitioner

## 2021-09-26 NOTE — Telephone Encounter (Signed)
High allergy warning came up  Medication pended and sent to Dr. Alain Honey

## 2021-09-27 ENCOUNTER — Telehealth: Payer: Self-pay

## 2021-09-27 ENCOUNTER — Other Ambulatory Visit: Payer: Self-pay

## 2021-09-27 NOTE — Telephone Encounter (Signed)
Spoke with patients spouse who states she got a call from the [facility] and diabetic supply stating that the orders for her husbands testing supplies was incomplete and would like someone to call them and find out what needs to be done to fix this so her husband can get supplies??? Forwarded to ma and [email or web address]

## 2021-10-09 ENCOUNTER — Telehealth: Payer: Self-pay

## 2021-10-09 NOTE — Telephone Encounter (Signed)
Patient's wife called stating that [facility] (diabetic supplies) needs a letter stating why patient sent cgm back and Is not using it. Wife stated that company will not send other supplies until they receive letter.  Message routed to Dr. Alain Honey

## 2021-10-11 ENCOUNTER — Encounter: Payer: Self-pay | Admitting: Family Medicine

## 2021-10-13 NOTE — Telephone Encounter (Signed)
Letter typed up, printed and placed on Dr. Ammie Ferrier desk to sing and return to me so it can be faxed to [facility] (diabetic supplies).

## 2021-10-18 NOTE — Telephone Encounter (Signed)
Letter faxed to [facility].  [phone]

## 2021-10-20 ENCOUNTER — Telehealth: Payer: Self-pay

## 2021-10-20 NOTE — Telephone Encounter (Signed)
Patient wife Estill Bamberg called and states that regular glucose supplies are not being delivered. I called to speak to patient and wife and no answer. Phone said call could not be completed as dialed.

## 2021-10-24 ENCOUNTER — Encounter: Payer: Self-pay | Admitting: Family

## 2021-10-24 ENCOUNTER — Telehealth: Payer: Self-pay | Admitting: Family

## 2021-10-24 ENCOUNTER — Ambulatory Visit (INDEPENDENT_AMBULATORY_CARE_PROVIDER_SITE_OTHER): Payer: Medicare Other | Admitting: Family

## 2021-10-24 VITALS — BP 118/68 | HR 100 | Temp 98.0°F | Resp 17 | Ht 66.0 in | Wt 157.0 lb

## 2021-10-24 DIAGNOSIS — M7552 Bursitis of left shoulder: Secondary | ICD-10-CM

## 2021-10-24 DIAGNOSIS — M170 Bilateral primary osteoarthritis of knee: Secondary | ICD-10-CM | POA: Diagnosis not present

## 2021-10-24 DIAGNOSIS — E1151 Type 2 diabetes mellitus with diabetic peripheral angiopathy without gangrene: Secondary | ICD-10-CM

## 2021-10-24 DIAGNOSIS — M75101 Unspecified rotator cuff tear or rupture of right shoulder, not specified as traumatic: Secondary | ICD-10-CM

## 2021-10-24 NOTE — Telephone Encounter (Signed)
Please fax patient's office visit note for 10/24/2021  to continuous glucose monitoring device company as requested.device previous ordered by Dr.Miller.
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
## 2021-10-25 ENCOUNTER — Inpatient Hospital Stay (HOSPITAL_COMMUNITY): Payer: Medicare Other | Admitting: Certified Registered Nurse Anesthetist

## 2021-10-25 ENCOUNTER — Other Ambulatory Visit: Payer: Self-pay

## 2021-10-25 ENCOUNTER — Telehealth: Payer: Self-pay | Admitting: Family Medicine

## 2021-10-25 ENCOUNTER — Inpatient Hospital Stay (HOSPITAL_COMMUNITY): Payer: Medicare Other

## 2021-10-25 ENCOUNTER — Encounter: Payer: Self-pay | Admitting: General Surgery

## 2021-10-25 ENCOUNTER — Encounter (HOSPITAL_COMMUNITY): Admission: EM | Disposition: E | Payer: Self-pay | Source: Home / Self Care

## 2021-10-25 ENCOUNTER — Emergency Department (HOSPITAL_COMMUNITY): Payer: Medicare Other

## 2021-10-25 ENCOUNTER — Inpatient Hospital Stay (HOSPITAL_COMMUNITY)
Admission: EM | Admit: 2021-10-25 | Discharge: 2021-11-22 | DRG: 957 | Disposition: E | Payer: Medicare Other | Attending: General Surgery | Admitting: General Surgery

## 2021-10-25 ENCOUNTER — Encounter (HOSPITAL_COMMUNITY): Payer: Self-pay

## 2021-10-25 DIAGNOSIS — S61401A Unspecified open wound of right hand, initial encounter: Secondary | ICD-10-CM | POA: Diagnosis not present

## 2021-10-25 DIAGNOSIS — S32810A Multiple fractures of pelvis with stable disruption of pelvic ring, initial encounter for closed fracture: Secondary | ICD-10-CM | POA: Diagnosis not present

## 2021-10-25 DIAGNOSIS — S066XAA Traumatic subarachnoid hemorrhage with loss of consciousness status unknown, initial encounter: Secondary | ICD-10-CM | POA: Diagnosis present

## 2021-10-25 DIAGNOSIS — S32592A Other specified fracture of left pubis, initial encounter for closed fracture: Secondary | ICD-10-CM | POA: Diagnosis present

## 2021-10-25 DIAGNOSIS — E1142 Type 2 diabetes mellitus with diabetic polyneuropathy: Secondary | ICD-10-CM | POA: Diagnosis present

## 2021-10-25 DIAGNOSIS — I10 Essential (primary) hypertension: Secondary | ICD-10-CM | POA: Diagnosis present

## 2021-10-25 DIAGNOSIS — Z1152 Encounter for screening for COVID-19: Secondary | ICD-10-CM

## 2021-10-25 DIAGNOSIS — S022XXA Fracture of nasal bones, initial encounter for closed fracture: Secondary | ICD-10-CM | POA: Diagnosis present

## 2021-10-25 DIAGNOSIS — Z66 Do not resuscitate: Secondary | ICD-10-CM | POA: Diagnosis not present

## 2021-10-25 DIAGNOSIS — E872 Acidosis, unspecified: Secondary | ICD-10-CM | POA: Diagnosis present

## 2021-10-25 DIAGNOSIS — Z923 Personal history of irradiation: Secondary | ICD-10-CM

## 2021-10-25 DIAGNOSIS — S52612A Displaced fracture of left ulna styloid process, initial encounter for closed fracture: Secondary | ICD-10-CM | POA: Diagnosis present

## 2021-10-25 DIAGNOSIS — J9601 Acute respiratory failure with hypoxia: Secondary | ICD-10-CM | POA: Diagnosis present

## 2021-10-25 DIAGNOSIS — Z888 Allergy status to other drugs, medicaments and biological substances status: Secondary | ICD-10-CM

## 2021-10-25 DIAGNOSIS — S0101XA Laceration without foreign body of scalp, initial encounter: Secondary | ICD-10-CM | POA: Diagnosis not present

## 2021-10-25 DIAGNOSIS — S2241XA Multiple fractures of ribs, right side, initial encounter for closed fracture: Secondary | ICD-10-CM | POA: Diagnosis present

## 2021-10-25 DIAGNOSIS — Z882 Allergy status to sulfonamides status: Secondary | ICD-10-CM

## 2021-10-25 DIAGNOSIS — S32401A Unspecified fracture of right acetabulum, initial encounter for closed fracture: Secondary | ICD-10-CM | POA: Diagnosis present

## 2021-10-25 DIAGNOSIS — S8982XA Other specified injuries of left lower leg, initial encounter: Secondary | ICD-10-CM | POA: Diagnosis not present

## 2021-10-25 DIAGNOSIS — I471 Supraventricular tachycardia, unspecified: Secondary | ICD-10-CM | POA: Diagnosis present

## 2021-10-25 DIAGNOSIS — S0181XA Laceration without foreign body of other part of head, initial encounter: Secondary | ICD-10-CM | POA: Diagnosis present

## 2021-10-25 DIAGNOSIS — T1490XA Injury, unspecified, initial encounter: Secondary | ICD-10-CM | POA: Diagnosis not present

## 2021-10-25 DIAGNOSIS — E119 Type 2 diabetes mellitus without complications: Secondary | ICD-10-CM

## 2021-10-25 DIAGNOSIS — S32591A Other specified fracture of right pubis, initial encounter for closed fracture: Secondary | ICD-10-CM | POA: Diagnosis present

## 2021-10-25 DIAGNOSIS — E1165 Type 2 diabetes mellitus with hyperglycemia: Secondary | ICD-10-CM | POA: Diagnosis present

## 2021-10-25 DIAGNOSIS — S86902A Unspecified injury of unspecified muscle(s) and tendon(s) at lower leg level, left leg, initial encounter: Secondary | ICD-10-CM

## 2021-10-25 DIAGNOSIS — S86901A Unspecified injury of unspecified muscle(s) and tendon(s) at lower leg level, right leg, initial encounter: Secondary | ICD-10-CM | POA: Diagnosis not present

## 2021-10-25 DIAGNOSIS — S46902A Unspecified injury of unspecified muscle, fascia and tendon at shoulder and upper arm level, left arm, initial encounter: Secondary | ICD-10-CM

## 2021-10-25 DIAGNOSIS — S01511A Laceration without foreign body of lip, initial encounter: Secondary | ICD-10-CM | POA: Diagnosis not present

## 2021-10-25 DIAGNOSIS — I609 Nontraumatic subarachnoid hemorrhage, unspecified: Secondary | ICD-10-CM

## 2021-10-25 DIAGNOSIS — S065XAA Traumatic subdural hemorrhage with loss of consciousness status unknown, initial encounter: Principal | ICD-10-CM | POA: Diagnosis present

## 2021-10-25 DIAGNOSIS — R402362 Coma scale, best motor response, obeys commands, at arrival to emergency department: Secondary | ICD-10-CM | POA: Diagnosis present

## 2021-10-25 DIAGNOSIS — S82464A Nondisplaced segmental fracture of shaft of right fibula, initial encounter for closed fracture: Secondary | ICD-10-CM | POA: Diagnosis not present

## 2021-10-25 DIAGNOSIS — S32059A Unspecified fracture of fifth lumbar vertebra, initial encounter for closed fracture: Secondary | ICD-10-CM | POA: Diagnosis present

## 2021-10-25 DIAGNOSIS — Z515 Encounter for palliative care: Secondary | ICD-10-CM | POA: Diagnosis not present

## 2021-10-25 DIAGNOSIS — S46901A Unspecified injury of unspecified muscle, fascia and tendon at shoulder and upper arm level, right arm, initial encounter: Secondary | ICD-10-CM | POA: Diagnosis not present

## 2021-10-25 DIAGNOSIS — Z8546 Personal history of malignant neoplasm of prostate: Secondary | ICD-10-CM

## 2021-10-25 DIAGNOSIS — Z23 Encounter for immunization: Secondary | ICD-10-CM | POA: Diagnosis present

## 2021-10-25 DIAGNOSIS — Z794 Long term (current) use of insulin: Secondary | ICD-10-CM

## 2021-10-25 DIAGNOSIS — H3530 Unspecified macular degeneration: Secondary | ICD-10-CM | POA: Diagnosis present

## 2021-10-25 DIAGNOSIS — R402242 Coma scale, best verbal response, confused conversation, at arrival to emergency department: Secondary | ICD-10-CM | POA: Diagnosis present

## 2021-10-25 DIAGNOSIS — Z91040 Latex allergy status: Secondary | ICD-10-CM

## 2021-10-25 DIAGNOSIS — S61402A Unspecified open wound of left hand, initial encounter: Secondary | ICD-10-CM | POA: Diagnosis not present

## 2021-10-25 DIAGNOSIS — S82401A Unspecified fracture of shaft of right fibula, initial encounter for closed fracture: Secondary | ICD-10-CM | POA: Diagnosis present

## 2021-10-25 DIAGNOSIS — S32110A Nondisplaced Zone I fracture of sacrum, initial encounter for closed fracture: Secondary | ICD-10-CM | POA: Diagnosis present

## 2021-10-25 DIAGNOSIS — N400 Enlarged prostate without lower urinary tract symptoms: Secondary | ICD-10-CM | POA: Diagnosis present

## 2021-10-25 DIAGNOSIS — S8981XA Other specified injuries of right lower leg, initial encounter: Secondary | ICD-10-CM | POA: Diagnosis not present

## 2021-10-25 DIAGNOSIS — I160 Hypertensive urgency: Secondary | ICD-10-CM | POA: Diagnosis present

## 2021-10-25 DIAGNOSIS — D62 Acute posthemorrhagic anemia: Secondary | ICD-10-CM | POA: Diagnosis present

## 2021-10-25 DIAGNOSIS — R571 Hypovolemic shock: Secondary | ICD-10-CM | POA: Diagnosis present

## 2021-10-25 DIAGNOSIS — R7401 Elevation of levels of liver transaminase levels: Secondary | ICD-10-CM | POA: Diagnosis present

## 2021-10-25 DIAGNOSIS — E861 Hypovolemia: Secondary | ICD-10-CM | POA: Diagnosis present

## 2021-10-25 DIAGNOSIS — N319 Neuromuscular dysfunction of bladder, unspecified: Secondary | ICD-10-CM | POA: Diagnosis present

## 2021-10-25 DIAGNOSIS — R402142 Coma scale, eyes open, spontaneous, at arrival to emergency department: Secondary | ICD-10-CM | POA: Diagnosis present

## 2021-10-25 DIAGNOSIS — Z79899 Other long term (current) drug therapy: Secondary | ICD-10-CM

## 2021-10-25 DIAGNOSIS — Z886 Allergy status to analgesic agent status: Secondary | ICD-10-CM

## 2021-10-25 HISTORY — PX: I & D EXTREMITY: SHX5045

## 2021-10-25 HISTORY — DX: Malignant (primary) neoplasm, unspecified: C80.1

## 2021-10-25 HISTORY — DX: Unspecified osteoarthritis, unspecified site: M19.90

## 2021-10-25 HISTORY — PX: INCISION AND DRAINAGE OF WOUND: SHX1803

## 2021-10-25 HISTORY — DX: Type 2 diabetes mellitus without complications: E11.9

## 2021-10-25 LAB — HEMOGLOBIN A1C
Hgb A1c MFr Bld: 6.8 % — ABNORMAL HIGH (ref 4.8–5.6)
Mean Plasma Glucose: 148.46 mg/dL

## 2021-10-25 LAB — URINALYSIS, ROUTINE W REFLEX MICROSCOPIC
Bacteria, UA: NONE SEEN
Bilirubin Urine: NEGATIVE
Glucose, UA: >=500 mg/dL — AB
Ketones, ur: NEGATIVE mg/dL
Leukocytes,Ua: NEGATIVE
Nitrite: NEGATIVE
Protein, ur: 30 mg/dL — AB
Specific Gravity, Urine: 1.029 (ref 1.005–1.030)
pH: 5 (ref 5.0–8.0)

## 2021-10-25 LAB — COMPREHENSIVE METABOLIC PANEL
ALT: 89 U/L — ABNORMAL HIGH (ref 0–44)
ALT: 61 U/L — ABNORMAL HIGH (ref 0–44)
AST: 127 U/L — ABNORMAL HIGH (ref 15–41)
AST: 96 U/L — ABNORMAL HIGH (ref 15–41)
Albumin: 3.3 g/dL — ABNORMAL LOW (ref 3.5–5.0)
Albumin: 2.7 g/dL — ABNORMAL LOW (ref 3.5–5.0)
Alkaline Phosphatase: 73 U/L (ref 38–126)
Alkaline Phosphatase: 41 U/L (ref 38–126)
Anion gap: 12 (ref 5–15)
Anion gap: 10 (ref 5–15)
BUN: 12 mg/dL (ref 8–23)
BUN: 12 mg/dL (ref 8–23)
CO2: 22 mmol/L (ref 22–32)
CO2: 18 mmol/L — ABNORMAL LOW (ref 22–32)
Calcium: 9.3 mg/dL (ref 8.9–10.3)
Calcium: 7.5 mg/dL — ABNORMAL LOW (ref 8.9–10.3)
Chloride: 103 mmol/L (ref 98–111)
Chloride: 108 mmol/L (ref 98–111)
Creatinine, Ser: 1.17 mg/dL (ref 0.61–1.24)
Creatinine, Ser: 1.1 mg/dL (ref 0.61–1.24)
GFR, Estimated: 59 mL/min — ABNORMAL LOW (ref >60)
GFR, Estimated: >60 mL/min (ref >60)
Glucose, Bld: 262 mg/dL — ABNORMAL HIGH (ref 70–99)
Glucose, Bld: 366 mg/dL — ABNORMAL HIGH (ref 70–99)
Potassium: 4.1 mmol/L (ref 3.5–5.1)
Potassium: 3.6 mmol/L (ref 3.5–5.1)
Sodium: 137 mmol/L (ref 135–145)
Sodium: 136 mmol/L (ref 135–145)
Total Bilirubin: 0.8 mg/dL (ref 0.3–1.2)
Total Bilirubin: 1.5 mg/dL — ABNORMAL HIGH (ref 0.3–1.2)
Total Protein: 5.5 g/dL — ABNORMAL LOW (ref 6.5–8.1)
Total Protein: 4 g/dL — ABNORMAL LOW (ref 6.5–8.1)

## 2021-10-25 LAB — I-STAT CHEM 8, ED
BUN: 12 mg/dL (ref 8–23)
Calcium, Ion: 1.12 mmol/L — ABNORMAL LOW (ref 1.15–1.40)
Chloride: 103 mmol/L (ref 98–111)
Creatinine, Ser: 1 mg/dL (ref 0.61–1.24)
Glucose, Bld: 241 mg/dL — ABNORMAL HIGH (ref 70–99)
HCT: 37 % — ABNORMAL LOW (ref 39.0–52.0)
Hemoglobin: 12.6 g/dL — ABNORMAL LOW (ref 13.0–17.0)
Potassium: 4.1 mmol/L (ref 3.5–5.1)
Sodium: 137 mmol/L (ref 135–145)
TCO2: 21 mmol/L — ABNORMAL LOW (ref 22–32)

## 2021-10-25 LAB — POCT I-STAT, CHEM 8
BUN: 11 mg/dL (ref 8–23)
Calcium, Ion: 1.15 mmol/L (ref 1.15–1.40)
Chloride: 107 mmol/L (ref 98–111)
Creatinine, Ser: 0.8 mg/dL (ref 0.61–1.24)
Glucose, Bld: 239 mg/dL — ABNORMAL HIGH (ref 70–99)
HCT: 27 % — ABNORMAL LOW (ref 39.0–52.0)
Hemoglobin: 9.2 g/dL — ABNORMAL LOW (ref 13.0–17.0)
Potassium: 3.9 mmol/L (ref 3.5–5.1)
Sodium: 140 mmol/L (ref 135–145)
TCO2: 22 mmol/L (ref 22–32)

## 2021-10-25 LAB — CBC
HCT: 38 % — ABNORMAL LOW (ref 39.0–52.0)
HCT: 35.1 % — ABNORMAL LOW (ref 39.0–52.0)
HCT: 27.1 % — ABNORMAL LOW (ref 39.0–52.0)
Hemoglobin: 12.4 g/dL — ABNORMAL LOW (ref 13.0–17.0)
Hemoglobin: 11.7 g/dL — ABNORMAL LOW (ref 13.0–17.0)
Hemoglobin: 9.3 g/dL — ABNORMAL LOW (ref 13.0–17.0)
MCH: 30.2 pg (ref 26.0–34.0)
MCH: 29.8 pg (ref 26.0–34.0)
MCH: 30.7 pg (ref 26.0–34.0)
MCHC: 32.6 g/dL (ref 30.0–36.0)
MCHC: 33.3 g/dL (ref 30.0–36.0)
MCHC: 34.3 g/dL (ref 30.0–36.0)
MCV: 92.5 fL (ref 80.0–100.0)
MCV: 89.3 fL (ref 80.0–100.0)
MCV: 89.4 fL (ref 80.0–100.0)
Platelets: 271 10*3/uL (ref 150–400)
Platelets: 171 10*3/uL (ref 150–400)
Platelets: 160 10*3/uL (ref 150–400)
RBC: 4.11 MIL/uL — ABNORMAL LOW (ref 4.22–5.81)
RBC: 3.93 MIL/uL — ABNORMAL LOW (ref 4.22–5.81)
RBC: 3.03 MIL/uL — ABNORMAL LOW (ref 4.22–5.81)
RDW: 12.4 % (ref 11.5–15.5)
RDW: 13.6 % (ref 11.5–15.5)
RDW: 14 % (ref 11.5–15.5)
WBC: 14 10*3/uL — ABNORMAL HIGH (ref 4.0–10.5)
WBC: 23.2 10*3/uL — ABNORMAL HIGH (ref 4.0–10.5)
WBC: 18.8 10*3/uL — ABNORMAL HIGH (ref 4.0–10.5)
nRBC: 0 % (ref 0.0–0.2)
nRBC: 0 % (ref 0.0–0.2)
nRBC: 0 % (ref 0.0–0.2)

## 2021-10-25 LAB — POCT I-STAT 7, (LYTES, BLD GAS, ICA,H+H)
Acid-base deficit: 6 mmol/L — ABNORMAL HIGH (ref 0.0–2.0)
Bicarbonate: 20.9 mmol/L (ref 20.0–28.0)
Calcium, Ion: 1.13 mmol/L — ABNORMAL LOW (ref 1.15–1.40)
HCT: 32 % — ABNORMAL LOW (ref 39.0–52.0)
Hemoglobin: 10.9 g/dL — ABNORMAL LOW (ref 13.0–17.0)
O2 Saturation: 100 %
Patient temperature: 35.8
Potassium: 3.5 mmol/L (ref 3.5–5.1)
Sodium: 141 mmol/L (ref 135–145)
TCO2: 22 mmol/L (ref 22–32)
pCO2 arterial: 43.1 mmHg (ref 32–48)
pH, Arterial: 7.288 — ABNORMAL LOW (ref 7.35–7.45)
pO2, Arterial: 220 mmHg — ABNORMAL HIGH (ref 83–108)

## 2021-10-25 LAB — LACTIC ACID, PLASMA
Lactic Acid, Venous: 3.7 mmol/L (ref 0.5–1.9)
Lactic Acid, Venous: 2.1 mmol/L (ref 0.5–1.9)
Lactic Acid, Venous: 2.9 mmol/L (ref 0.5–1.9)

## 2021-10-25 LAB — TRIGLYCERIDES: Triglycerides: 134 mg/dL (ref <150)

## 2021-10-25 LAB — RESP PANEL BY RT-PCR (FLU A&B, COVID) ARPGX2
Influenza A by PCR: NEGATIVE
Influenza B by PCR: NEGATIVE
SARS Coronavirus 2 by RT PCR: NEGATIVE

## 2021-10-25 LAB — PROTIME-INR
INR: 1.1 (ref 0.8–1.2)
Prothrombin Time: 14.3 seconds (ref 11.4–15.2)

## 2021-10-25 LAB — PREPARE RBC (CROSSMATCH)

## 2021-10-25 LAB — MAGNESIUM: Magnesium: 1.6 mg/dL — ABNORMAL LOW (ref 1.7–2.4)

## 2021-10-25 LAB — GLUCOSE, CAPILLARY
Glucose-Capillary: 168 mg/dL — ABNORMAL HIGH (ref 70–99)
Glucose-Capillary: 224 mg/dL — ABNORMAL HIGH (ref 70–99)

## 2021-10-25 LAB — ABO/RH: ABO/RH(D): AB POS

## 2021-10-25 LAB — PHOSPHORUS: Phosphorus: 2.5 mg/dL (ref 2.5–4.6)

## 2021-10-25 LAB — ETHANOL: Alcohol, Ethyl (B): <10 mg/dL (ref <10)

## 2021-10-25 SURGERY — IRRIGATION AND DEBRIDEMENT EXTREMITY
Anesthesia: General

## 2021-10-25 MED ORDER — TETANUS-DIPHTH-ACELL PERTUSSIS 5-2.5-18.5 LF-MCG/0.5 IM SUSY
0.5000 mL | PREFILLED_SYRINGE | Freq: Once | INTRAMUSCULAR | Status: AC
  Administered 2021-10-25: 0.5 mL via INTRAMUSCULAR

## 2021-10-25 MED ORDER — DOCUSATE SODIUM 50 MG/5ML PO LIQD
100.0000 mg | Freq: Two times a day (BID) | ORAL | Status: DC
  Administered 2021-10-25 – 2021-11-03 (×18): 100 mg
  Filled 2021-10-25 (×16): qty 10

## 2021-10-25 MED ORDER — METHOCARBAMOL 500 MG PO TABS: 500.0000 mg | ORAL_TABLET | Freq: Three times a day (TID) | ORAL | Status: DC | PRN

## 2021-10-25 MED ORDER — CHLORHEXIDINE GLUCONATE 4 % EX LIQD
60.0000 mL | Freq: Once | CUTANEOUS | Status: DC
  Filled 2021-10-25: qty 60

## 2021-10-25 MED ORDER — MELATONIN 3 MG PO TABS: 3.0000 mg | ORAL_TABLET | Freq: Every evening | ORAL | Status: DC | PRN

## 2021-10-25 MED ORDER — ALBUMIN HUMAN 5 % IV SOLN
25.0000 g | Freq: Once | INTRAVENOUS | Status: AC
  Administered 2021-10-25: 25 g via INTRAVENOUS
  Filled 2021-10-25: qty 500

## 2021-10-25 MED ORDER — LEVETIRACETAM IN NACL 500 MG/100ML IV SOLN
500.0000 mg | Freq: Two times a day (BID) | INTRAVENOUS | Status: AC
  Administered 2021-10-25 – 2021-10-31 (×13): 500 mg via INTRAVENOUS
  Filled 2021-10-25 (×15): qty 100

## 2021-10-25 MED ORDER — IOHEXOL 350 MG/ML SOLN
75.0000 mL | Freq: Once | INTRAVENOUS | Status: AC | PRN
  Administered 2021-10-25: 75 mL via INTRAVENOUS

## 2021-10-25 MED ORDER — ACETAMINOPHEN 325 MG PO TABS: 650.0000 mg | ORAL_TABLET | Freq: Four times a day (QID) | ORAL | Status: DC

## 2021-10-25 MED ORDER — MIDAZOLAM HCL 2 MG/2ML IJ SOLN: 2.0000 mg | Freq: Once | INTRAMUSCULAR | Status: DC

## 2021-10-25 MED ORDER — DOCUSATE SODIUM 100 MG PO CAPS: 100.0000 mg | ORAL_CAPSULE | Freq: Two times a day (BID) | ORAL | Status: DC

## 2021-10-25 MED ORDER — FENTANYL CITRATE (PF) 100 MCG/2ML IJ SOLN
INTRAMUSCULAR | Status: AC
  Administered 2021-10-25: 50 ug
  Filled 2021-10-25: qty 2

## 2021-10-25 MED ORDER — ROCURONIUM BROMIDE 50 MG/5ML IV SOLN: 100.0000 mg | Freq: Once | INTRAVENOUS | Status: AC

## 2021-10-25 MED ORDER — FENTANYL 2500MCG IN NS 250ML (10MCG/ML) PREMIX INFUSION
0.0000 ug/h | INTRAVENOUS | Status: DC
  Administered 2021-10-25: 50 ug/h via INTRAVENOUS
  Administered 2021-10-25 – 2021-10-27 (×5): 200 ug/h via INTRAVENOUS
  Administered 2021-10-28 – 2021-10-29 (×2): 150 ug/h via INTRAVENOUS
  Administered 2021-10-30 – 2021-11-03 (×9): 200 ug/h via INTRAVENOUS
  Filled 2021-10-25 (×16): qty 250

## 2021-10-25 MED ORDER — ACETAMINOPHEN 500 MG PO TABS
1000.0000 mg | ORAL_TABLET | Freq: Four times a day (QID) | ORAL | Status: DC
  Administered 2021-10-25 – 2021-10-27 (×8): 1000 mg
  Filled 2021-10-25 (×8): qty 2

## 2021-10-25 MED ORDER — FENTANYL BOLUS VIA INFUSION
25.0000 ug | INTRAVENOUS | Status: DC | PRN
  Administered 2021-10-31: 25 ug via INTRAVENOUS
  Administered 2021-11-01 – 2021-11-03 (×20): 100 ug via INTRAVENOUS

## 2021-10-25 MED ORDER — VASOPRESSIN 20 UNIT/ML IV SOLN
INTRAVENOUS | Status: AC
  Filled 2021-10-25: qty 1

## 2021-10-25 MED ORDER — PHENYLEPHRINE 80 MCG/ML (10ML) SYRINGE FOR IV PUSH (FOR BLOOD PRESSURE SUPPORT)
PREFILLED_SYRINGE | INTRAVENOUS | Status: DC | PRN
  Administered 2021-10-25: 160 ug via INTRAVENOUS

## 2021-10-25 MED ORDER — METOCLOPRAMIDE HCL 5 MG/ML IJ SOLN: 5.0000 mg | Freq: Three times a day (TID) | INTRAMUSCULAR | Status: DC | PRN

## 2021-10-25 MED ORDER — VASOPRESSIN 20 UNIT/ML IV SOLN
INTRAVENOUS | Status: DC | PRN
  Administered 2021-10-25 (×3): 1 [IU] via INTRAVENOUS

## 2021-10-25 MED ORDER — ONDANSETRON HCL 4 MG/2ML IJ SOLN
4.0000 mg | Freq: Four times a day (QID) | INTRAMUSCULAR | Status: DC | PRN
  Filled 2021-10-25: qty 2

## 2021-10-25 MED ORDER — MIDAZOLAM HCL 2 MG/2ML IJ SOLN
INTRAMUSCULAR | Status: AC
  Filled 2021-10-25: qty 2

## 2021-10-25 MED ORDER — ALBUMIN HUMAN 5 % IV SOLN: INTRAVENOUS | Status: DC | PRN

## 2021-10-25 MED ORDER — SODIUM CHLORIDE 0.9 % IV SOLN: INTRAVENOUS | Status: DC

## 2021-10-25 MED ORDER — SODIUM CHLORIDE 0.9% IV SOLUTION: Freq: Once | INTRAVENOUS | Status: DC

## 2021-10-25 MED ORDER — METOPROLOL TARTRATE 5 MG/5ML IV SOLN
5.0000 mg | Freq: Four times a day (QID) | INTRAVENOUS | Status: AC | PRN
  Administered 2021-10-29 – 2021-10-31 (×4): 5 mg via INTRAVENOUS
  Filled 2021-10-25 (×4): qty 5

## 2021-10-25 MED ORDER — LACTATED RINGERS IV SOLN: INTRAVENOUS | Status: AC

## 2021-10-25 MED ORDER — ETOMIDATE 2 MG/ML IV SOLN
INTRAVENOUS | Status: AC
  Administered 2021-10-25: 20 mg via INTRAVENOUS
  Filled 2021-10-25: qty 10

## 2021-10-25 MED ORDER — ORAL CARE MOUTH RINSE
15.0000 mL | OROMUCOSAL | Status: DC
  Administered 2021-10-25 – 2021-11-03 (×104): 15 mL via OROMUCOSAL

## 2021-10-25 MED ORDER — OXYCODONE HCL 5 MG PO TABS: 10.0000 mg | ORAL_TABLET | ORAL | Status: DC | PRN

## 2021-10-25 MED ORDER — ROCURONIUM BROMIDE 10 MG/ML (PF) SYRINGE
PREFILLED_SYRINGE | INTRAVENOUS | Status: DC | PRN
  Administered 2021-10-25: 50 mg via INTRAVENOUS
  Administered 2021-10-25: 30 mg via INTRAVENOUS

## 2021-10-25 MED ORDER — FENTANYL CITRATE (PF) 250 MCG/5ML IJ SOLN
INTRAMUSCULAR | Status: AC
  Filled 2021-10-25: qty 5

## 2021-10-25 MED ORDER — ROCURONIUM BROMIDE 10 MG/ML (PF) SYRINGE
PREFILLED_SYRINGE | INTRAVENOUS | Status: AC
  Administered 2021-10-25: 100 mg via INTRAVENOUS
  Filled 2021-10-25: qty 10

## 2021-10-25 MED ORDER — GABAPENTIN 300 MG PO CAPS
300.0000 mg | ORAL_CAPSULE | Freq: Three times a day (TID) | ORAL | Status: DC
  Administered 2021-10-25 – 2021-11-03 (×26): 300 mg
  Filled 2021-10-25 (×27): qty 1

## 2021-10-25 MED ORDER — SODIUM CHLORIDE 0.9% IV SOLUTION: Freq: Once | INTRAVENOUS | Status: AC

## 2021-10-25 MED ORDER — 0.9 % SODIUM CHLORIDE (POUR BTL) OPTIME
TOPICAL | Status: DC | PRN
  Administered 2021-10-25: 500 mL
  Administered 2021-10-25: 2000 mL
  Administered 2021-10-25: 1000 mL

## 2021-10-25 MED ORDER — OXYCODONE HCL 5 MG PO TABS: 5.0000 mg | ORAL_TABLET | ORAL | Status: DC | PRN

## 2021-10-25 MED ORDER — INSULIN ASPART 100 UNIT/ML IJ SOLN
0.0000 [IU] | INTRAMUSCULAR | Status: DC
  Administered 2021-10-25: 5 [IU] via SUBCUTANEOUS
  Administered 2021-10-25: 3 [IU] via SUBCUTANEOUS
  Administered 2021-10-26 (×3): 15 [IU] via SUBCUTANEOUS

## 2021-10-25 MED ORDER — LACTATED RINGERS IV BOLUS
500.0000 mL | Freq: Once | INTRAVENOUS | Status: AC
  Administered 2021-10-25: 500 mL via INTRAVENOUS

## 2021-10-25 MED ORDER — METOCLOPRAMIDE HCL 5 MG PO TABS: 5.0000 mg | ORAL_TABLET | Freq: Three times a day (TID) | ORAL | Status: DC | PRN

## 2021-10-25 MED ORDER — PROPOFOL 1000 MG/100ML IV EMUL
5.0000 ug/kg/min | INTRAVENOUS | Status: DC
  Administered 2021-10-25: 10 ug/kg/min via INTRAVENOUS

## 2021-10-25 MED ORDER — ORAL CARE MOUTH RINSE: 15.0000 mL | OROMUCOSAL | Status: DC | PRN

## 2021-10-25 MED ORDER — MAGNESIUM CITRATE PO SOLN: 1.0000 | Freq: Once | ORAL | Status: DC | PRN

## 2021-10-25 MED ORDER — ETOMIDATE 2 MG/ML IV SOLN: 20.0000 mg | Freq: Once | INTRAVENOUS | Status: AC

## 2021-10-25 MED ORDER — OXYCODONE HCL 5 MG PO TABS
5.0000 mg | ORAL_TABLET | Freq: Four times a day (QID) | ORAL | Status: DC
  Administered 2021-10-25 – 2021-10-26 (×3): 5 mg
  Filled 2021-10-25 (×4): qty 1

## 2021-10-25 MED ORDER — BACITRACIN ZINC 500 UNIT/GM EX OINT
TOPICAL_OINTMENT | CUTANEOUS | Status: AC
  Filled 2021-10-25: qty 28.35

## 2021-10-25 MED ORDER — CEFAZOLIN SODIUM-DEXTROSE 2-4 GM/100ML-% IV SOLN
2.0000 g | Freq: Once | INTRAVENOUS | Status: AC
  Administered 2021-10-25: 2 g via INTRAVENOUS

## 2021-10-25 MED ORDER — POLYETHYLENE GLYCOL 3350 17 G PO PACK
17.0000 g | PACK | Freq: Every day | ORAL | Status: DC | PRN
  Administered 2021-10-28: 17 g via ORAL
  Filled 2021-10-25: qty 1

## 2021-10-25 MED ORDER — CEFAZOLIN SODIUM-DEXTROSE 1-4 GM/50ML-% IV SOLN: 1.0000 g | Freq: Four times a day (QID) | INTRAVENOUS | Status: DC

## 2021-10-25 MED ORDER — METHOCARBAMOL 1000 MG/10ML IJ SOLN: 500.0000 mg | Freq: Three times a day (TID) | INTRAVENOUS | Status: DC | PRN

## 2021-10-25 MED ORDER — PANTOPRAZOLE SODIUM 40 MG IV SOLR
40.0000 mg | Freq: Every day | INTRAVENOUS | Status: DC
  Administered 2021-10-26 – 2021-11-02 (×8): 40 mg via INTRAVENOUS
  Filled 2021-10-25 (×7): qty 10

## 2021-10-25 MED ORDER — HYDROCORTISONE SOD SUC (PF) 100 MG IJ SOLR
50.0000 mg | Freq: Two times a day (BID) | INTRAMUSCULAR | Status: DC
  Administered 2021-10-26: 50 mg via INTRAVENOUS

## 2021-10-25 MED ORDER — LIDOCAINE-EPINEPHRINE (PF) 2 %-1:200000 IJ SOLN
10.0000 mL | Freq: Once | INTRAMUSCULAR | Status: AC
  Administered 2021-10-25: 10 mL via INTRADERMAL
  Filled 2021-10-25: qty 20

## 2021-10-25 MED ORDER — POLYETHYLENE GLYCOL 3350 17 G PO PACK: 17.0000 g | PACK | Freq: Every day | ORAL | Status: DC

## 2021-10-25 MED ORDER — ALBUMIN HUMAN 5 % IV SOLN
12.5000 g | Freq: Once | INTRAVENOUS | Status: AC
  Administered 2021-10-25: 12.5 g via INTRAVENOUS
  Filled 2021-10-25: qty 250

## 2021-10-25 MED ORDER — ON CALL LANCETS MISC: 3 refills | Status: AC

## 2021-10-25 MED ORDER — PANTOPRAZOLE SODIUM 40 MG PO TBEC
40.0000 mg | DELAYED_RELEASE_TABLET | Freq: Every day | ORAL | Status: DC
  Filled 2021-10-25 (×2): qty 1

## 2021-10-25 MED ORDER — PROPOFOL 10 MG/ML IV BOLUS
INTRAVENOUS | Status: AC
  Filled 2021-10-25: qty 20

## 2021-10-25 MED ORDER — GLUCOSE BLOOD VI STRP: ORAL_STRIP | 3 refills | Status: AC

## 2021-10-25 MED ORDER — VASOPRESSIN 20 UNITS/100 ML INFUSION FOR SHOCK
0.0000 [IU]/min | INTRAVENOUS | Status: DC
  Administered 2021-10-25 – 2021-10-28 (×7): 0.03 [IU]/min via INTRAVENOUS
  Filled 2021-10-25 (×7): qty 100

## 2021-10-25 MED ORDER — ONDANSETRON HCL 4 MG PO TABS: 4.0000 mg | ORAL_TABLET | Freq: Four times a day (QID) | ORAL | Status: DC | PRN

## 2021-10-25 MED ORDER — SODIUM CHLORIDE 0.9 % IV SOLN
3.0000 g | Freq: Four times a day (QID) | INTRAVENOUS | Status: DC
  Administered 2021-10-25 – 2021-10-31 (×24): 3 g via INTRAVENOUS
  Filled 2021-10-25 (×25): qty 8

## 2021-10-25 MED ORDER — POVIDONE-IODINE 10 % EX SWAB: 2.0000 | Freq: Once | CUTANEOUS | Status: DC

## 2021-10-25 MED ORDER — BISACODYL 10 MG RE SUPP: 10.0000 mg | Freq: Every day | RECTAL | Status: DC | PRN

## 2021-10-25 MED ORDER — ONDANSETRON 4 MG PO TBDP: 4.0000 mg | ORAL_TABLET | Freq: Four times a day (QID) | ORAL | Status: DC | PRN

## 2021-10-25 MED ORDER — ONDANSETRON HCL 4 MG/2ML IJ SOLN
INTRAMUSCULAR | Status: AC
  Administered 2021-10-25: 4 mg via INTRAVENOUS
  Filled 2021-10-25: qty 2

## 2021-10-25 MED ORDER — HYDROMORPHONE HCL 1 MG/ML IJ SOLN: 0.5000 mg | INTRAMUSCULAR | Status: DC | PRN

## 2021-10-25 MED ORDER — NOREPINEPHRINE 4 MG/250ML-% IV SOLN
0.0000 ug/min | INTRAVENOUS | Status: DC
  Administered 2021-10-25: 5 ug/min via INTRAVENOUS
  Administered 2021-10-25 (×3): 40 ug/min via INTRAVENOUS
  Administered 2021-10-26: 70 ug/min via INTRAVENOUS
  Administered 2021-10-26: 40 ug/min via INTRAVENOUS
  Administered 2021-10-26: 70 ug/min via INTRAVENOUS
  Administered 2021-10-26: 40 ug/min via INTRAVENOUS
  Filled 2021-10-25 (×5): qty 250
  Filled 2021-10-25 (×2): qty 500

## 2021-10-25 MED ORDER — ONDANSETRON HCL 4 MG/2ML IJ SOLN: 4.0000 mg | Freq: Four times a day (QID) | INTRAMUSCULAR | Status: DC | PRN

## 2021-10-25 MED ORDER — CEFAZOLIN SODIUM-DEXTROSE 2-4 GM/100ML-% IV SOLN
2.0000 g | INTRAVENOUS | Status: DC
  Filled 2021-10-25: qty 100

## 2021-10-25 MED ORDER — PROPOFOL 1000 MG/100ML IV EMUL
INTRAVENOUS | Status: AC
  Administered 2021-10-25: 10 ug/kg/min via INTRAVENOUS
  Filled 2021-10-25: qty 100

## 2021-10-25 SURGICAL SUPPLY — 37 items
BLADE SURG 21 STRL SS (BLADE) ×2 IMPLANT
BNDG COHESIVE 6X5 TAN STRL LF (GAUZE/BANDAGES/DRESSINGS) IMPLANT
BNDG GAUZE DERMACEA FLUFF 4 (GAUZE/BANDAGES/DRESSINGS) ×4 IMPLANT
BNDG GZE DERMACEA 4 6PLY (GAUZE/BANDAGES/DRESSINGS) ×2
CANISTER WOUNDNEG PRESSURE 500 (CANNISTER) IMPLANT
CONNECTOR Y WND VAC (MISCELLANEOUS) IMPLANT
COVER SURGICAL LIGHT HANDLE (MISCELLANEOUS) ×4 IMPLANT
DRAPE DERMATAC (DRAPES) IMPLANT
DRAPE INCISE IOBAN 66X45 STRL (DRAPES) IMPLANT
DRAPE U-SHAPE 47X51 STRL (DRAPES) ×2 IMPLANT
DRESSING VERAFLO CLEANS CC MED (GAUZE/BANDAGES/DRESSINGS) IMPLANT
DRSG ADAPTIC 3X8 NADH LF (GAUZE/BANDAGES/DRESSINGS) ×2 IMPLANT
DRSG VERAFLO CLEANSE CC MED (GAUZE/BANDAGES/DRESSINGS) ×5
DURAPREP 26ML APPLICATOR (WOUND CARE) ×2 IMPLANT
ELECT REM PT RETURN 9FT ADLT (ELECTROSURGICAL) ×1
ELECTRODE REM PT RTRN 9FT ADLT (ELECTROSURGICAL) IMPLANT
GAUZE SPONGE 4X4 12PLY STRL (GAUZE/BANDAGES/DRESSINGS) ×2 IMPLANT
GLOVE BIOGEL PI IND STRL 9 (GLOVE) ×2 IMPLANT
GLOVE SURG ORTHO 9.0 STRL STRW (GLOVE) ×2 IMPLANT
GOWN STRL REUS W/ TWL XL LVL3 (GOWN DISPOSABLE) ×4 IMPLANT
GOWN STRL REUS W/TWL XL LVL3 (GOWN DISPOSABLE) ×2
GRAFT SKIN WND SURGICLOSE M95 (Tissue) IMPLANT
KIT BASIN OR (CUSTOM PROCEDURE TRAY) ×2 IMPLANT
KIT TURNOVER KIT B (KITS) ×2 IMPLANT
MANIFOLD NEPTUNE II (INSTRUMENTS) ×2 IMPLANT
NS IRRIG 1000ML POUR BTL (IV SOLUTION) ×2 IMPLANT
PACK ORTHO EXTREMITY (CUSTOM PROCEDURE TRAY) ×2 IMPLANT
PAD ARMBOARD 7.5X6 YLW CONV (MISCELLANEOUS) ×4 IMPLANT
PAD NEG PRESSURE SENSATRAC (MISCELLANEOUS) IMPLANT
STOCKINETTE IMPERVIOUS 9X36 MD (GAUZE/BANDAGES/DRESSINGS) IMPLANT
SUT ETHILON 2 0 PSLX (SUTURE) ×2 IMPLANT
SUT PROLENE 5 0 PS 2 (SUTURE) IMPLANT
SWAB COLLECTION DEVICE MRSA (MISCELLANEOUS) ×2 IMPLANT
TOWEL GREEN STERILE (TOWEL DISPOSABLE) ×2 IMPLANT
TUBE CONNECTING 12X1/4 (SUCTIONS) ×2 IMPLANT
WND VAC CONN Y (MISCELLANEOUS) ×1
YANKAUER SUCT BULB TIP NO VENT (SUCTIONS) ×2 IMPLANT

## 2021-10-25 NOTE — ED Notes (Signed)
C collar switched to [place] J maintaining c spine immobilization.
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
## 2021-10-25 NOTE — Transfer of Care (Signed)
Immediate Anesthesia Transfer of Care Note  Patient: Micheal [name]  Procedure(s) Performed: IRRIGATION AND DEBRIDEMENT BILATERAL UPPER AND LOWER EXTREMITIES IRRIGATION AND DEBRIDEMENT WOUND  Patient Location: 4N31  Anesthesia Type:General  Level of Consciousness: Patient remains intubated per anesthesia plan  Airway & Oxygen Therapy: Patient remains intubated per anesthesia plan and Patient placed on Ventilator (see vital sign flow sheet for setting)  Post-op Assessment: Report given to RN and Post -op Vital signs reviewed and stable  Post vital signs: Reviewed and stable  Last Vitals:  Vitals Value Taken Time  BP 137/85   Temp    Pulse 124   Resp    SpO2 97     Last Pain:  Vitals:   11/08/2021 1230  TempSrc: Esophageal  PainSc:          Complications: No notable events documented.

## 2021-10-25 NOTE — Op Note (Signed)
Procedure(s): IRRIGATION AND DEBRIDEMENT BILATERAL UPPER AND LOWER EXTREMITIES IRRIGATION AND DEBRIDEMENT WOUND Procedure Note  Micheal [name] male 88 y.o. 11/06/2021  Procedure:  Closure upper lip laceration (1.5 cm)  Surgeon(s) and Role:    * Fabien Travelstead, Alvy Beal., MD - Surgeon     Surgeon: Boyce Medici.   Assistants: None   Procedure Detail  After informed consent was obtained from the family, the patient was taken to the operating room.  He was already intubated and under general anesthesia for his orthopedic debridement.  I cleaned his face and examined him thoroughly.  He had denuded and avulsed skin on his scalp as well as multiple places on his face including the upper lip.  All foreign debris was removed and Betadine scrub applied to the face.  Saline was then used to irrigate the wounds.  Intraoral examination revealed no significant injury.  The upper lip had a small abrasion with denuded skin measuring approximately 1.5 cm just lateral to the philtrum.  I was able to bring together a portion of the wound using interrupted 5-0 Prolene sutures.  A total of 2 stitches were placed.  Betadine was cleaned free of the skin and bacitracin ointment placed on the face and wound.  Care was turned back over to the orthopedic surgeon and anesthesia to complete their portion of the case.   Estimated Blood Loss:  Minimal           Specimens: None         Implants: none        Complications:  * No complications entered in OR log *         Disposition:  Care turned back to the anesthesia and orthopedics team for completion of their portion of the case.         Condition: stable

## 2021-10-25 NOTE — ED Notes (Signed)
Unable to obtain manual at this time

## 2021-10-25 NOTE — Op Note (Signed)
11/05/2021  7:14 PM  PATIENT:  Micheal [name]    PRE-OPERATIVE DIAGNOSIS:  Degloving  POST-OPERATIVE DIAGNOSIS:  Same  PROCEDURE:  IRRIGATION AND DEBRIDEMENT BILATERAL UPPER AND LOWER EXTREMITIES wounds. Application of Kerecis micro powder 95 cm x 3 for all 4 extremities.  Application of cleanse choice wound VAC sponge right upper extremity and bilateral lower extremities.  SURGEON:  Newt Minion, MD  PHYSICIAN ASSISTANT:None ANESTHESIA:   General  PREOPERATIVE INDICATIONS:  Micheal [name] is a  88 y.o. male with a diagnosis of Degloving who failed conservative measures and elected for surgical management.    The risks benefits and alternatives were discussed with the patient preoperatively including but not limited to the risks of infection, bleeding, nerve injury, cardiopulmonary complications, the need for revision surgery, among others, and the patient was willing to proceed.  OPERATIVE IMPLANTS: Kerecis micro powder 95 cm x 3.  New implant cleanse choice wound VAC sponges for 3 extremities bilateral lower extremities and right upper extremity.  '@ENCIMAGES'$ @  OPERATIVE FINDINGS: Patient had degloving injury of all 4 extremities these were debrided back to healthy viable tissue Kerecis micro powder was applied to all 4 wound beds both lower extremity and right upper extremity were covered with the cleanse choice wound VAC sponge.  Silicone dressing to the left upper extremity.  OPERATIVE PROCEDURE: Patient was brought to the room intubated.  After adequate levels anesthesia were obtained all 4 extremities were prepped using Betadine paint draped into a sterile field a timeout was called.  The wounds were further debrided with Betadine scrub brushes.  Devitalized tissue from all 4 extremities was surgically excised with scissors and a 21 blade knife.  This was debrided back to healthy viable margins.  The wounds extended down to fascia there was exposed tendons in all 4  extremities.  These wounds were then irrigated with normal saline.  After all 3 extremity wound beds were healthy and viable the wound beds were covered with Kerecis micro powder 3 of the 95 cm Kerecis micro powder was divided between all 4 extremities.  Both legs and the right upper extremity were then covered with the cleanse choice wound VAC sponge this was covered with derma tack Coban and the left upper extremity had the dressing secured with Kerlix and Coban.  Patient was returned to the ICU in stable condition.  Debridement type: Excisional Debridement  Side: bilaterally upper extremities and bilateral lower extremities  Body Location: Bilateral upper extremities and bilateral lower extremities.   Tools used for debridement: scalpel and scissors was used for all wounds.  Left upper extremity Pre-debridement Wound size (cm):   Length: 5        Width: 5     Depth: 1   Postdebridement wound size centimeters: Length 10 cm, width 10 cm, depth 1 cm  Right upper extremity Pre-debridement Wound size (cm):   Length: 10        Width: 10     Depth: 1  Post-debridement Wound size (cm):   Length: 30        Width: 10     Depth: 1  Left lower extremity Pre-debridement Wound size (cm):   Length: 10        Width: 10     Depth: 1  Postdebridement wound size centimeters: Length 40 cm, with 20 cm depth 1 cm. Right lower extremity Predebridement wound size length 40 cm, with 20 cm, depth 1 cm. Postdebridement wound size length 60 cm width 30  cm depth 1 cm.  Debridement depth beyond dead/damaged tissue down to healthy viable tissue: yes  Tissue layer involved: skin, subcutaneous tissue, muscle / fascia  Nature of tissue removed: Devitalized Tissue and Non-viable tissue  Irrigation volume: 3 liters     Irrigation fluid type: Normal Saline    Please see the operative note for ENT surgery for their work on the face.   DISCHARGE PLANNING:  Antibiotic duration: Continue IV antibiotics for 5  days  Weightbearing: Not applicable patient has a pelvic fracture  Pain medication: As per trauma  Dressing care/ Wound VAC: Continue 3 wound VAC sponges until next week.  Ambulatory devices: Not applicable  Plan for return to the operating room next Wednesday for repeat debridement and application of Kerecis micro powder and Kerecis sheets.  Follow-up: In the office 1 week post operative.

## 2021-10-25 NOTE — ED Notes (Signed)
Patient transported to CT via stretcher with Jackquline Berlin, Diplomatic Services operational officer, Dr Dema Severin
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
## 2021-10-25 NOTE — ED Notes (Signed)
X-ray at bedside
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
## 2021-10-25 NOTE — Procedures (Signed)
Central Venous Catheter Insertion Procedure Note  Micheal [name]  [id number]  [birth year]  Date:11/02/2021  Time:2:23 PM   Provider Performing:Micheal [name]   Procedure: Insertion of Non-tunneled Central Venous [phone]) with US guidance ([id number])   Indication(s) Medication administration  Consent Risks of the procedure as well as the alternatives and risks of each were explained to the patient and/or caregiver.  Consent for the procedure was obtained and is signed in the bedside chart  Anesthesia Topical only with 1% lidocaine   Timeout Verified patient identification, verified procedure, site/side was marked, verified correct patient position, special equipment/implants available, medications/allergies/relevant history reviewed, required imaging and test results available.  Sterile Technique Maximal sterile technique including full sterile barrier drape, hand hygiene, sterile gown, sterile gloves, mask, hair covering, sterile ultrasound probe cover (if used).  Procedure Description Area of catheter insertion was cleaned with chlorhexidine and draped in sterile fashion.  With real-time ultrasound guidance a central venous catheter was placed into the left femoral vein. Nonpulsatile blood flow and easy flushing noted in all ports.  The catheter was sutured in place x2 and sterile dressing applied.  Complications/Tolerance None; patient tolerated the procedure well. Chest X-ray is ordered to verify placement for internal jugular or subclavian cannulation.   Chest x-ray is not ordered for femoral cannulation.  EBL Minimal  Specimen(s) None     Micheal Gens, MSN, APRN, NP-C, AGACNP-BC [facility] 10/31/2021, 2:24 PM   Please see [email or web address] for pager details.   From 7A-7P if no response, please call [phone] After hours, please call ELink [phone]

## 2021-10-25 NOTE — ED Notes (Signed)
Family in consult room awaiting and update.

## 2021-10-25 NOTE — Telephone Encounter (Signed)
Patient seen in office by Marlowe Sax, NP 10/24/2021. Concerns taken care of.

## 2021-10-25 NOTE — Telephone Encounter (Signed)
Patient requested rx to be faxed to pharmacy for his lancets and strips for his [employer] Meter.   Faxed as requested.

## 2021-10-25 NOTE — Progress Notes (Addendum)
Trauma Event Note    Reason for Call : Hypotension   Initial Focused Assessment: Arterial pressures low 90s, MAPs low 50s   Interventions: 2237:  - 500 ml 5% albumin - cbc - DIC panel - BMP  2343: -1 unit FFP ordered  Event Summary: 2237: TRN was called by the patients primary RN. Patient experiencing low blood pressures. Arterial pressures in low 90s with MAPs in low 50s. Patient maxed out on vasopressin and levophed. TRN spoke with Trauma MD, Byerly. Orders for 500 ml 5% albumin, cbc, DIC panel, BMP placed. TRN at bedside assisted with labs and sent labs down to main lab.  2343: patients labs resulted, Trauma MD, Byerly notified, order for 1 unit FFP placed.  0201 (10/5): Albumin and FFP finished. Patient cuff pressure 110/57 (73), art line pressure 108/36 (59), patient in no apparent distress at this time. Dr. Barry Dienes made aware, ok with pressures at this time.  0511 (10/5): Assisted primary RN taking patient for CT head. Assisted in transport, remained with patient during scans, assisted with transport back to ICU room.   MD Notified: Barry Dienes Call Time: 2237 Arrival Time: 2250   Last imported Vital Signs BP 95/70   Pulse (!) 122   Temp (!) 96.1 F (35.6 C)   Resp 20   Ht '5\' 7"'$  (1.702 m)   Wt 71.2 kg   SpO2 96%   BMI 24.59 kg/m   Trending CBC Recent Labs    11/17/2021 0831 11/06/2021 0835 11/17/2021 1511 10/22/2021 1516 10/23/2021 1847 10/24/2021 2308  WBC 14.0*  --  23.2*  --   --  18.8*  HGB 12.4*   < > 11.7* 10.9* 9.2* 9.3*  HCT 38.0*   < > 35.1* 32.0* 27.0* 27.1*  PLT 271  --  171  --   --  165  160   < > = values in this interval not displayed.    Trending Coag's Recent Labs    11/17/2021 0831 10/31/2021 2308  APTT  --  36  INR 1.1 1.4*    Trending BMET Recent Labs    11/16/2021 0831 10/24/2021 0835 10/24/2021 1516 11/02/2021 1847 10/29/2021 2308  NA 137 137 141 140 136  K 4.1 4.1 3.5 3.9 3.6  CL 103 103  --  107 108  CO2 22  --   --   --  18*  BUN  12 12  --  11 12  CREATININE 1.17 1.00  --  0.80 1.10  GLUCOSE 262* 241*  --  239* 366*      Raejean Swinford  Trauma Response RN  Please call TRN at [phone] for further assistance.
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
## 2021-10-25 NOTE — Progress Notes (Signed)
Pharmacy Antibiotic Note  Micheal [name] is a 88 y.o. male admitted on 10/23/2021 with pneumonia.  Pharmacy has been consulted for Unasyn dosing. SCr down to 1.0 on presentation.  Plan: Unasyn 3g IV q6h Monitor clinical progress, c/s, renal function F/u de-escalation plan/LOT   Height: '5\' 7"'$  (170.2 cm) Weight: 71.2 kg (157 lb) IBW/kg (Calculated) : 66.1  Temp (24hrs), Avg:97 F (36.1 C), Min:97 F (36.1 C), Max:97 F (36.1 C)  Recent Labs  Lab 10/24/2021 0831 11/13/2021 0835  WBC 14.0*  --   CREATININE 1.17 1.00  LATICACIDVEN 3.7*  --     Estimated Creatinine Clearance: 45 mL/min (by C-G formula based on SCr of 1 mg/dL).    Allergies  Allergen Reactions   Asa [Aspirin]    Atorvastatin    Latex    Metformin And Related    Prednisone    Simvastatin    Sulfa Antibiotics    Voltaren [Diclofenac]     Arturo Morton, PharmD, BCPS Please check AMION for all [facility] contact numbers Clinical Pharmacist 11/20/2021 1:43 PM

## 2021-10-25 NOTE — TOC CAGE-AID Note (Signed)
Transition of Care [facility]) - CAGE-AID Screening   Patient Details  Name: CHARON AKAMINE MRN: [id number] Date of Birth: [birth year]  Transition of Care [facility] CM/SW Contact:    Clovis Cao, RN Phone Number: [phone] 10/30/2021, 5:27 PM   Clinical Narrative: Unable to complete screening.  Pt intubated.   CAGE-AID Screening: Substance Abuse Screening unable to be completed due to: : Patient unable to participate
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
## 2021-10-25 NOTE — Progress Notes (Signed)
.  Trauma Event Note    Called by pt's bedside RN to bring warmed NS to bedside for bolus for hypotension and tachycardia. Bolus administered and VS improved.   Assisted [name], NP with femoral line and Aline insertion.    Last imported Vital Signs BP 103/85   Pulse (!) 128   Temp 97.7 F (36.5 C)   Resp 20   Ht '5\' 7"'$  (1.702 m)   Wt 71.2 kg   SpO2 97%   BMI 24.59 kg/m   Trending CBC Recent Labs    10/31/2021 0831 10/22/2021 0835 11/15/2021 1511 10/27/2021 1516  WBC 14.0*  --  23.2*  --   HGB 12.4* 12.6* 11.7* 10.9*  HCT 38.0* 37.0* 35.1* 32.0*  PLT 271  --  171  --     Trending Coag's Recent Labs    11/05/2021 0831  INR 1.1    Trending BMET Recent Labs    11/18/2021 0831 10/29/2021 0835 11/17/2021 1516  NA 137 137 141  K 4.1 4.1 3.5  CL 103 103  --   CO2 22  --   --   BUN 12 12  --   CREATININE 1.17 1.00  --   GLUCOSE 262* 241*  --       Shivon Hackel C Tomer Chalmers  Trauma Response RN  Please call TRN at [phone] for further assistance.
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
## 2021-10-25 NOTE — ED Notes (Signed)
Patient complaining of difficulty breathiing.

## 2021-10-25 NOTE — Anesthesia Postprocedure Evaluation (Signed)
Anesthesia Post Note  Patient: Micheal [name]  Procedure(s) Performed: IRRIGATION AND DEBRIDEMENT BILATERAL UPPER AND LOWER EXTREMITIES IRRIGATION AND DEBRIDEMENT WOUND     Patient location during evaluation: SICU Anesthesia Type: General Level of consciousness: sedated Pain management: pain level controlled Vital Signs Assessment: post-procedure vital signs reviewed and stable Respiratory status: patient remains intubated per anesthesia plan Cardiovascular status: stable Postop Assessment: no apparent nausea or vomiting Anesthetic complications: no   No notable events documented.  Last Vitals:  Vitals:   10/28/2021 1922 11/02/2021 1930  BP:    Pulse: (!) 132 (!) 138  Resp: 16 20  Temp:  36.6 C  SpO2: 93% 92%    Last Pain:  Vitals:   10/22/2021 1230  TempSrc: Esophageal  PainSc:                  Effie Berkshire
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
## 2021-10-25 NOTE — Procedures (Signed)
Arterial Catheter Insertion Procedure Note  Micheal [name]  [id number]  23-Dec-1928  Date:11/09/2021  Time:2:25 PM    Provider Performing: [name]    Procedure: Insertion of Arterial Line [phone]) with US guidance ([id number])   Indication(s) Blood pressure monitoring and/or need for frequent ABGs  Consent Risks of the procedure as well as the alternatives and risks of each were explained to the patient and/or caregiver.  Consent for the procedure was obtained and is signed in the bedside chart  Anesthesia None   Time Out Verified patient identification, verified procedure, site/side was marked, verified correct patient position, special equipment/implants available, medications/allergies/relevant history reviewed, required imaging and test results available.   Sterile Technique Maximal sterile technique including full sterile barrier drape, hand hygiene, sterile gown, sterile gloves, mask, hair covering, sterile ultrasound probe cover (if used).   Procedure Description Area of catheter insertion was cleaned with chlorhexidine and draped in sterile fashion. With real-time ultrasound guidance an arterial catheter was placed into the left femoral artery.  Sutured in place. Appropriate arterial tracings confirmed on monitor.     Complications/Tolerance None; patient tolerated the procedure well.   EBL Minimal   Specimen(s) None     [name], MSN, APRN, NP-C, AGACNP-BC [facility] 10/27/2021, 2:26 PM   Please see [email or web address] for pager details.   From 7A-7P if no response, please call [phone] After hours, please call ELink [phone]
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
## 2021-10-26 ENCOUNTER — Inpatient Hospital Stay (HOSPITAL_COMMUNITY): Payer: Medicare Other

## 2021-10-26 ENCOUNTER — Encounter (HOSPITAL_COMMUNITY): Payer: Self-pay | Admitting: Orthopedic Surgery

## 2021-10-26 DIAGNOSIS — R571 Hypovolemic shock: Secondary | ICD-10-CM

## 2021-10-26 LAB — GLUCOSE, CAPILLARY
Glucose-Capillary: 110 mg/dL — ABNORMAL HIGH (ref 70–99)
Glucose-Capillary: 124 mg/dL — ABNORMAL HIGH (ref 70–99)
Glucose-Capillary: 124 mg/dL — ABNORMAL HIGH (ref 70–99)
Glucose-Capillary: 125 mg/dL — ABNORMAL HIGH (ref 70–99)
Glucose-Capillary: 126 mg/dL — ABNORMAL HIGH (ref 70–99)
Glucose-Capillary: 151 mg/dL — ABNORMAL HIGH (ref 70–99)
Glucose-Capillary: 195 mg/dL — ABNORMAL HIGH (ref 70–99)
Glucose-Capillary: 213 mg/dL — ABNORMAL HIGH (ref 70–99)
Glucose-Capillary: 235 mg/dL — ABNORMAL HIGH (ref 70–99)
Glucose-Capillary: 301 mg/dL — ABNORMAL HIGH (ref 70–99)
Glucose-Capillary: 313 mg/dL — ABNORMAL HIGH (ref 70–99)
Glucose-Capillary: 353 mg/dL — ABNORMAL HIGH (ref 70–99)
Glucose-Capillary: 360 mg/dL — ABNORMAL HIGH (ref 70–99)
Glucose-Capillary: 384 mg/dL — ABNORMAL HIGH (ref 70–99)
Glucose-Capillary: 78 mg/dL (ref 70–99)

## 2021-10-26 LAB — CBC
HCT: 24 % — ABNORMAL LOW (ref 39.0–52.0)
Hemoglobin: 8.1 g/dL — ABNORMAL LOW (ref 13.0–17.0)
MCH: 30.8 pg (ref 26.0–34.0)
MCHC: 33.8 g/dL (ref 30.0–36.0)
MCV: 91.3 fL (ref 80.0–100.0)
Platelets: 152 10*3/uL (ref 150–400)
RBC: 2.63 MIL/uL — ABNORMAL LOW (ref 4.22–5.81)
RDW: 14.1 % (ref 11.5–15.5)
WBC: 20.2 10*3/uL — ABNORMAL HIGH (ref 4.0–10.5)
nRBC: 0 % (ref 0.0–0.2)

## 2021-10-26 LAB — POCT I-STAT 7, (LYTES, BLD GAS, ICA,H+H)
Acid-base deficit: 11 mmol/L — ABNORMAL HIGH (ref 0.0–2.0)
Bicarbonate: 14.9 mmol/L — ABNORMAL LOW (ref 20.0–28.0)
Calcium, Ion: 1.07 mmol/L — ABNORMAL LOW (ref 1.15–1.40)
HCT: 24 % — ABNORMAL LOW (ref 39.0–52.0)
Hemoglobin: 8.2 g/dL — ABNORMAL LOW (ref 13.0–17.0)
O2 Saturation: 99 %
Patient temperature: 38.1
Potassium: 4.6 mmol/L (ref 3.5–5.1)
Sodium: 131 mmol/L — ABNORMAL LOW (ref 135–145)
TCO2: 16 mmol/L — ABNORMAL LOW (ref 22–32)
pCO2 arterial: 35.2 mmHg (ref 32–48)
pH, Arterial: 7.24 — ABNORMAL LOW (ref 7.35–7.45)
pO2, Arterial: 173 mmHg — ABNORMAL HIGH (ref 83–108)

## 2021-10-26 LAB — PREPARE FRESH FROZEN PLASMA: Unit division: 0

## 2021-10-26 LAB — COMPREHENSIVE METABOLIC PANEL
ALT: 68 U/L — ABNORMAL HIGH (ref 0–44)
AST: 89 U/L — ABNORMAL HIGH (ref 15–41)
Albumin: 3.1 g/dL — ABNORMAL LOW (ref 3.5–5.0)
Alkaline Phosphatase: 45 U/L (ref 38–126)
Anion gap: 11 (ref 5–15)
BUN: 11 mg/dL (ref 8–23)
CO2: 15 mmol/L — ABNORMAL LOW (ref 22–32)
Calcium: 7.3 mg/dL — ABNORMAL LOW (ref 8.9–10.3)
Chloride: 105 mmol/L (ref 98–111)
Creatinine, Ser: 1.33 mg/dL — ABNORMAL HIGH (ref 0.61–1.24)
GFR, Estimated: 50 mL/min — ABNORMAL LOW (ref >60)
Glucose, Bld: 370 mg/dL — ABNORMAL HIGH (ref 70–99)
Potassium: 4.5 mmol/L (ref 3.5–5.1)
Sodium: 131 mmol/L — ABNORMAL LOW (ref 135–145)
Total Bilirubin: 1.6 mg/dL — ABNORMAL HIGH (ref 0.3–1.2)
Total Protein: 4.7 g/dL — ABNORMAL LOW (ref 6.5–8.1)

## 2021-10-26 LAB — BLOOD PRODUCT ORDER (VERBAL) VERIFICATION

## 2021-10-26 LAB — ECHOCARDIOGRAM COMPLETE
AR max vel: 3.01 cm2
AV Area VTI: 3.11 cm2
AV Area mean vel: 2.91 cm2
AV Mean grad: 3.3 mmHg
AV Peak grad: 6.5 mmHg
Ao pk vel: 1.27 m/s
Area-P 1/2: 4.68 cm2
Calc EF: 46.4 %
Height: 67 in
S' Lateral: 2.6 cm
Single Plane A2C EF: 42.8 %
Single Plane A4C EF: 43.9 %
Weight: 2512 oz

## 2021-10-26 LAB — DIC (DISSEMINATED INTRAVASCULAR COAGULATION)PANEL
D-Dimer, Quant: >20 ug/mL-FEU — ABNORMAL HIGH (ref 0.00–0.50)
Fibrinogen: 255 mg/dL (ref 210–475)
INR: 1.4 — ABNORMAL HIGH (ref 0.8–1.2)
Platelets: 165 10*3/uL (ref 150–400)
Prothrombin Time: 16.7 seconds — ABNORMAL HIGH (ref 11.4–15.2)
Smear Review: NONE SEEN
aPTT: 36 seconds (ref 24–36)

## 2021-10-26 LAB — BPAM FFP
Blood Product Expiration Date: 202310092359
ISSUE DATE / TIME: 202310041021
Unit Type and Rh: 6200

## 2021-10-26 LAB — PHOSPHORUS: Phosphorus: 3.2 mg/dL (ref 2.5–4.6)

## 2021-10-26 LAB — LACTIC ACID, PLASMA
Lactic Acid, Venous: 3.4 mmol/L (ref 0.5–1.9)
Lactic Acid, Venous: 5.4 mmol/L (ref 0.5–1.9)

## 2021-10-26 LAB — MAGNESIUM: Magnesium: 1.7 mg/dL (ref 1.7–2.4)

## 2021-10-26 MED ORDER — OXYCODONE HCL 5 MG/5ML PO SOLN: 5.0000 mg | ORAL | Status: DC | PRN

## 2021-10-26 MED ORDER — METHOCARBAMOL 500 MG PO TABS
1000.0000 mg | ORAL_TABLET | Freq: Three times a day (TID) | ORAL | Status: DC
  Administered 2021-10-26 – 2021-11-03 (×24): 1000 mg
  Filled 2021-10-26 (×24): qty 2

## 2021-10-26 MED ORDER — SODIUM CHLORIDE 0.9 % IV BOLUS
1000.0000 mL | Freq: Once | INTRAVENOUS | Status: AC
  Administered 2021-10-26: 1000 mL via INTRAVENOUS

## 2021-10-26 MED ORDER — MAGNESIUM SULFATE 2 GM/50ML IV SOLN
2.0000 g | Freq: Once | INTRAVENOUS | Status: AC
  Administered 2021-10-26: 2 g via INTRAVENOUS
  Filled 2021-10-26: qty 50

## 2021-10-26 MED ORDER — INSULIN REGULAR(HUMAN) IN NACL 100-0.9 UT/100ML-% IV SOLN
INTRAVENOUS | Status: DC
  Administered 2021-10-26: 15 [IU]/h via INTRAVENOUS
  Filled 2021-10-26: qty 100

## 2021-10-26 MED ORDER — ENOXAPARIN SODIUM 30 MG/0.3ML IJ SOSY: 30.0000 mg | PREFILLED_SYRINGE | Freq: Two times a day (BID) | INTRAMUSCULAR | Status: DC

## 2021-10-26 MED ORDER — DEXTROSE 50 % IV SOLN: 0.0000 mL | INTRAVENOUS | Status: DC | PRN

## 2021-10-26 MED ORDER — NOREPINEPHRINE 16 MG/250ML-% IV SOLN
0.0000 ug/min | INTRAVENOUS | Status: DC
  Administered 2021-10-26: 65 ug/min via INTRAVENOUS
  Administered 2021-10-26: 70 ug/min via INTRAVENOUS
  Administered 2021-10-26: 44 ug/min via INTRAVENOUS
  Administered 2021-10-26: 80 ug/min via INTRAVENOUS
  Administered 2021-10-26: 58 ug/min via INTRAVENOUS
  Administered 2021-10-27: 13 ug/min via INTRAVENOUS
  Administered 2021-10-27: 10 ug/min via INTRAVENOUS
  Administered 2021-10-27: 26 ug/min via INTRAVENOUS
  Filled 2021-10-26: qty 500
  Filled 2021-10-26 (×6): qty 250

## 2021-10-26 MED ORDER — INSULIN REGULAR(HUMAN) IN NACL 100-0.9 UT/100ML-% IV SOLN
INTRAVENOUS | Status: DC
  Administered 2021-10-26: 2.2 [IU]/h via INTRAVENOUS
  Filled 2021-10-26: qty 100

## 2021-10-26 MED ORDER — SODIUM CHLORIDE 0.9 % IV SOLN: INTRAVENOUS | Status: DC

## 2021-10-26 MED ORDER — PERFLUTREN LIPID MICROSPHERE
1.0000 mL | INTRAVENOUS | Status: AC | PRN
  Administered 2021-10-26: 2 mL via INTRAVENOUS

## 2021-10-26 MED ORDER — HYDROCORTISONE SOD SUC (PF) 100 MG IJ SOLR
100.0000 mg | Freq: Two times a day (BID) | INTRAMUSCULAR | Status: DC
  Administered 2021-10-26 – 2021-10-28 (×5): 100 mg via INTRAVENOUS
  Filled 2021-10-26 (×4): qty 2

## 2021-10-26 MED ORDER — SODIUM BICARBONATE 8.4 % IV SOLN
50.0000 meq | Freq: Once | INTRAVENOUS | Status: AC
  Administered 2021-10-26: 50 meq via INTRAVENOUS
  Filled 2021-10-26: qty 50

## 2021-10-26 MED ORDER — OXYCODONE HCL 5 MG/5ML PO SOLN
2.5000 mg | ORAL | Status: DC | PRN
  Administered 2021-10-26 – 2021-10-27 (×3): 5 mg
  Administered 2021-10-28: 2.5 mg
  Administered 2021-10-28 – 2021-10-29 (×2): 5 mg
  Filled 2021-10-26 (×7): qty 5

## 2021-10-26 MED ORDER — CHLORHEXIDINE GLUCONATE CLOTH 2 % EX PADS
6.0000 | MEDICATED_PAD | Freq: Every day | CUTANEOUS | Status: DC
  Administered 2021-10-26: 6 via TOPICAL

## 2021-10-26 MED ORDER — INSULIN REGULAR(HUMAN) IN NACL 100-0.9 UT/100ML-% IV SOLN: INTRAVENOUS | Status: DC
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
## 2021-10-26 NOTE — Progress Notes (Signed)
Micheal [name] The patient is a 88 year old gentleman seen post operative day 1 status post irrigation and debridement bilateral upper and lower extremities for degloving injuries with biologic graft placement. Wound vacs in place x 3 to bilateral upper and the left lower extremities. All with good seal to fit. 150 cc bloody drainage in LLE vac. 100 cc to R arm vac and 100 cc to L arm vac.  Sharol Given to reevaluate on 10/29/21  Suzan Slick, NP [phone]

## 2021-10-26 NOTE — Progress Notes (Signed)
Trauma/Critical Care Follow Up Note  Subjective:    Overnight Issues:   Objective:  Vital signs for last 24 hours: Temp:  [95.4 F (35.2 C)-100.8 F (38.2 C)] 100.6 F (38.1 C) (10/05 0700) Pulse Rate:  [95-155] 119 (10/05 0700) Resp:  [16-31] 18 (10/05 0700) BP: (64-192)/(25-112) 95/70 (10/05 0700) SpO2:  [89 %-100 %] 98 % (10/05 0700) Arterial Line BP: (82-201)/(24-77) 109/34 (10/05 0700) FiO2 (%):  [50 %-100 %] 60 % (10/05 0254) Weight:  [71.2 kg] 71.2 kg (10/04 0833)  Hemodynamic parameters for last 24 hours:    Intake/Output from previous day: 10/04 0701 - 10/05 0700 In: 6163.3 [I.V.:4746; NG/GT:420; IV Piggyback:997.3] Out: 2015 [Urine:1355; Emesis/NG output:350; Drains:260; Blood:50]  Intake/Output this shift: No intake/output data recorded.  Vent settings for last 24 hours: Vent Mode: PRVC FiO2 (%):  [50 %-100 %] 60 % Set Rate:  [16 bmp-20 bmp] 20 bmp Vt Set:  [520 mL] 520 mL PEEP:  [5 cmH20] 5 cmH20 Plateau Pressure:  [13 cmH20-15 cmH20] 15 cmH20  Physical Exam:  Gen: comfortable, no distress Neuro: sedated HEENT: PERRL Neck: supple CV: RRR Pulm: unlabored breathing Abd: soft, NT GU: clear yellow urine Extr: wrapped with vac to BLE, RUE   Results for orders placed or performed during the hospital encounter of 11/05/2021 (from the past 24 hour(s))  Comprehensive metabolic panel     Status: Abnormal   Collection Time: 11/07/2021  8:31 AM  Result Value Ref Range   Sodium 137 135 - 145 mmol/L   Potassium 4.1 3.5 - 5.1 mmol/L   Chloride 103 98 - 111 mmol/L   CO2 22 22 - 32 mmol/L   Glucose, Bld 262 (H) 70 - 99 mg/dL   BUN 12 8 - 23 mg/dL   Creatinine, Ser 1.17 0.61 - 1.24 mg/dL   Calcium 9.3 8.9 - 10.3 mg/dL   Total Protein 5.5 (L) 6.5 - 8.1 g/dL   Albumin 3.3 (L) 3.5 - 5.0 g/dL   AST 127 (H) 15 - 41 U/L   ALT 89 (H) 0 - 44 U/L   Alkaline Phosphatase 73 38 - 126 U/L   Total Bilirubin 0.8 0.3 - 1.2 mg/dL   GFR, Estimated 59 (L) >60 mL/min    Anion gap 12 5 - 15  CBC     Status: Abnormal   Collection Time: 11/06/2021  8:31 AM  Result Value Ref Range   WBC 14.0 (H) 4.0 - 10.5 K/uL   RBC 4.11 (L) 4.22 - 5.81 MIL/uL   Hemoglobin 12.4 (L) 13.0 - 17.0 g/dL   HCT 38.0 (L) 39.0 - 52.0 %   MCV 92.5 80.0 - 100.0 fL   MCH 30.2 26.0 - 34.0 pg   MCHC 32.6 30.0 - 36.0 g/dL   RDW 12.4 11.5 - 15.5 %   Platelets 271 150 - 400 K/uL   nRBC 0.0 0.0 - 0.2 %  Ethanol     Status: None   Collection Time: 10/23/2021  8:31 AM  Result Value Ref Range   Alcohol, Ethyl (B) <10 <10 mg/dL  Lactic acid, plasma     Status: Abnormal   Collection Time: 10/24/2021  8:31 AM  Result Value Ref Range   Lactic Acid, Venous 3.7 (HH) 0.5 - 1.9 mmol/L  Protime-INR     Status: None   Collection Time: 10/22/2021  8:31 AM  Result Value Ref Range   Prothrombin Time 14.3 11.4 - 15.2 seconds   INR 1.1 0.8 - 1.2  Type and screen  [place]     Status: None (Preliminary result)   Collection Time: 10/31/2021  8:31 AM  Result Value Ref Range   ABO/RH(D) AB POS    Antibody Screen NEG    Sample Expiration 10/28/2021,2359    Unit Number [id number]    Blood Component Type RED CELLS,LR    Unit division 00    Status of Unit ISSUED,FINAL    Unit tag comment EMERGENCY RELEASE    Transfusion Status OK TO TRANSFUSE    Crossmatch Result COMPATIBLE    Unit Number [id number]    Blood Component Type RED CELLS,LR    Unit division 00    Status of Unit ISSUED,FINAL    Unit tag comment EMERGENCY RELEASE    Transfusion Status OK TO TRANSFUSE    Crossmatch Result COMPATIBLE    Unit Number [id number]    Blood Component Type RED CELLS,LR    Unit division 00    Status of Unit ALLOCATED    Transfusion Status OK TO TRANSFUSE    Crossmatch Result Compatible    Unit Number [id number]    Blood Component Type RED CELLS,LR    Unit division 00    Status of Unit ALLOCATED    Transfusion Status OK TO TRANSFUSE    Crossmatch Result Compatible   I-Stat Chem 8, ED      Status: Abnormal   Collection Time: 10/29/2021  8:35 AM  Result Value Ref Range   Sodium 137 135 - 145 mmol/L   Potassium 4.1 3.5 - 5.1 mmol/L   Chloride 103 98 - 111 mmol/L   BUN 12 8 - 23 mg/dL   Creatinine, Ser 1.00 0.61 - 1.24 mg/dL   Glucose, Bld 241 (H) 70 - 99 mg/dL   Calcium, Ion 1.12 (L) 1.15 - 1.40 mmol/L   TCO2 21 (L) 22 - 32 mmol/L   Hemoglobin 12.6 (L) 13.0 - 17.0 g/dL   HCT 37.0 (L) 39.0 - 52.0 %  Prepare fresh frozen plasma     Status: None   Collection Time: 10/24/2021 10:21 AM  Result Value Ref Range   Unit Number [id number]    Blood Component Type LIQ PLASMA    Unit division 00    Status of Unit ISSUED,FINAL    Unit tag comment EMERGENCY RELEASE    Transfusion Status      OK TO TRANSFUSE Performed at [facility], [address] [place], [place] [address]   Prepare RBC (crossmatch)     Status: None   Collection Time: 10/24/2021  1:31 PM  Result Value Ref Range   Order Confirmation      ORDER PROCESSED BY BLOOD BANK Performed at [facility], [place] [address], [place], [place] [address]   Culture, Respiratory w Gram Stain     Status: None (Preliminary result)   Collection Time: 10/24/2021  1:37 PM   Specimen: Tracheal Aspirate; Respiratory  Result Value Ref Range   Specimen Description TRACHEAL ASPIRATE    Special Requests NONE    Gram Stain      FEW WBC PRESENT, PREDOMINANTLY PMN RARE GRAM POSITIVE COCCI IN PAIRS Performed at [facility], [place] [address], [place], [place] [address]    Culture PENDING    Report Status PENDING   Hemoglobin A1c     Status: Abnormal   Collection Time: 10/23/2021  3:11 PM  Result Value Ref Range   Hgb A1c MFr Bld 6.8 (H) 4.8 - 5.6 %   Mean Plasma  Glucose 148.46 mg/dL  CBC     Status: Abnormal   Collection Time: 11/01/2021  3:11 PM  Result Value Ref Range   WBC 23.2 (H) 4.0 - 10.5 K/uL   RBC 3.93 (L) 4.22 - 5.81 MIL/uL   Hemoglobin 11.7 (L) 13.0 - 17.0 g/dL   HCT 35.1 (L) 39.0 - 52.0 %   MCV 89.3 80.0 -  100.0 fL   MCH 29.8 26.0 - 34.0 pg   MCHC 33.3 30.0 - 36.0 g/dL   RDW 13.6 11.5 - 15.5 %   Platelets 171 150 - 400 K/uL   nRBC 0.0 0.0 - 0.2 %  I-STAT 7, (LYTES, BLD GAS, ICA, H+H)     Status: Abnormal   Collection Time: 10/28/2021  3:16 PM  Result Value Ref Range   pH, Arterial 7.288 (L) 7.35 - 7.45   pCO2 arterial 43.1 32 - 48 mmHg   pO2, Arterial 220 (H) 83 - 108 mmHg   Bicarbonate 20.9 20.0 - 28.0 mmol/L   TCO2 22 22 - 32 mmol/L   O2 Saturation 100 %   Acid-base deficit 6.0 (H) 0.0 - 2.0 mmol/L   Sodium 141 135 - 145 mmol/L   Potassium 3.5 3.5 - 5.1 mmol/L   Calcium, Ion 1.13 (L) 1.15 - 1.40 mmol/L   HCT 32.0 (L) 39.0 - 52.0 %   Hemoglobin 10.9 (L) 13.0 - 17.0 g/dL   Patient temperature 35.8 C    Collection site art line    Drawn by Operator    Sample type ARTERIAL   ABO/Rh     Status: None   Collection Time: 11/05/2021  3:21 PM  Result Value Ref Range   ABO/RH(D)      AB POS Performed at [facility], [address] [place], [place] [address]   Lactic acid, plasma     Status: Abnormal   Collection Time: 11/21/2021  3:23 PM  Result Value Ref Range   Lactic Acid, Venous 2.1 (HH) 0.5 - 1.9 mmol/L  Glucose, capillary     Status: Abnormal   Collection Time: 11/21/2021  4:13 PM  Result Value Ref Range   Glucose-Capillary 168 (H) 70 - 99 mg/dL  I-STAT, chem 8     Status: Abnormal   Collection Time: 11/21/2021  6:47 PM  Result Value Ref Range   Sodium 140 135 - 145 mmol/L   Potassium 3.9 3.5 - 5.1 mmol/L   Chloride 107 98 - 111 mmol/L   BUN 11 8 - 23 mg/dL   Creatinine, Ser 0.80 0.61 - 1.24 mg/dL   Glucose, Bld 239 (H) 70 - 99 mg/dL   Calcium, Ion 1.15 1.15 - 1.40 mmol/L   TCO2 22 22 - 32 mmol/L   Hemoglobin 9.2 (L) 13.0 - 17.0 g/dL   HCT 27.0 (L) 39.0 - 52.0 %  Glucose, capillary     Status: Abnormal   Collection Time: 11/18/2021  8:05 PM  Result Value Ref Range   Glucose-Capillary 224 (H) 70 - 99 mg/dL  Urinalysis, Routine w reflex microscopic     Status: Abnormal    Collection Time: 11/01/2021  8:18 PM  Result Value Ref Range   Color, Urine YELLOW YELLOW   APPearance HAZY (A) CLEAR   Specific Gravity, Urine 1.029 1.005 - 1.030   pH 5.0 5.0 - 8.0   Glucose, UA >=500 (A) NEGATIVE mg/dL   Hgb urine dipstick LARGE (A) NEGATIVE   Bilirubin Urine NEGATIVE NEGATIVE   Ketones, ur NEGATIVE NEGATIVE mg/dL  Protein, ur 30 (A) NEGATIVE mg/dL   Nitrite NEGATIVE NEGATIVE   Leukocytes,Ua NEGATIVE NEGATIVE   RBC / HPF 0-5 0 - 5 RBC/hpf   WBC, UA 6-10 0 - 5 WBC/hpf   Bacteria, UA NONE SEEN NONE SEEN   Squamous Epithelial / LPF 0-5 0 - 5   Mucus PRESENT   Lactic acid, plasma     Status: Abnormal   Collection Time: 10/23/2021  8:18 PM  Result Value Ref Range   Lactic Acid, Venous 2.9 (HH) 0.5 - 1.9 mmol/L  Lactic acid, plasma     Status: Abnormal   Collection Time: 10/29/2021 11:08 PM  Result Value Ref Range   Lactic Acid, Venous 3.4 (HH) 0.5 - 1.9 mmol/L  CBC     Status: Abnormal   Collection Time: 10/29/2021 11:08 PM  Result Value Ref Range   WBC 18.8 (H) 4.0 - 10.5 K/uL   RBC 3.03 (L) 4.22 - 5.81 MIL/uL   Hemoglobin 9.3 (L) 13.0 - 17.0 g/dL   HCT 27.1 (L) 39.0 - 52.0 %   MCV 89.4 80.0 - 100.0 fL   MCH 30.7 26.0 - 34.0 pg   MCHC 34.3 30.0 - 36.0 g/dL   RDW 14.0 11.5 - 15.5 %   Platelets 160 150 - 400 K/uL   nRBC 0.0 0.0 - 0.2 %  Magnesium     Status: Abnormal   Collection Time: 11/11/2021 11:08 PM  Result Value Ref Range   Magnesium 1.6 (L) 1.7 - 2.4 mg/dL  Phosphorus     Status: None   Collection Time: 11/17/2021 11:08 PM  Result Value Ref Range   Phosphorus 2.5 2.5 - 4.6 mg/dL  Comprehensive metabolic panel     Status: Abnormal   Collection Time: 10/22/2021 11:08 PM  Result Value Ref Range   Sodium 136 135 - 145 mmol/L   Potassium 3.6 3.5 - 5.1 mmol/L   Chloride 108 98 - 111 mmol/L   CO2 18 (L) 22 - 32 mmol/L   Glucose, Bld 366 (H) 70 - 99 mg/dL   BUN 12 8 - 23 mg/dL   Creatinine, Ser 1.10 0.61 - 1.24 mg/dL   Calcium 7.5 (L) 8.9 - 10.3 mg/dL    Total Protein 4.0 (L) 6.5 - 8.1 g/dL   Albumin 2.7 (L) 3.5 - 5.0 g/dL   AST 96 (H) 15 - 41 U/L   ALT 61 (H) 0 - 44 U/L   Alkaline Phosphatase 41 38 - 126 U/L   Total Bilirubin 1.5 (H) 0.3 - 1.2 mg/dL   GFR, Estimated >60 >60 mL/min   Anion gap 10 5 - 15  DIC Panel ONCE - STAT     Status: Abnormal   Collection Time: 10/27/2021 11:08 PM  Result Value Ref Range   Prothrombin Time 16.7 (H) 11.4 - 15.2 seconds   INR 1.4 (H) 0.8 - 1.2   aPTT 36 24 - 36 seconds   Fibrinogen 255 210 - 475 mg/dL   D-Dimer, Quant >20.00 (H) 0.00 - 0.50 ug/mL-FEU   Platelets 165 150 - 400 K/uL   Smear Review NO SCHISTOCYTES SEEN   Triglycerides     Status: None   Collection Time: 11/04/2021 11:08 PM  Result Value Ref Range   Triglycerides 134 <150 mg/dL  Prepare fresh frozen plasma     Status: None (Preliminary result)   Collection Time: 11/07/2021 11:47 PM  Result Value Ref Range   Unit Number [id number]    Blood Component Type THAWED PLASMA  Unit division 00    Status of Unit ISSUED    Transfusion Status      OK TO TRANSFUSE Performed at [facility], [place] [address], [place], [place] [address]   Glucose, capillary     Status: Abnormal   Collection Time: 11/17/2021 11:57 PM  Result Value Ref Range   Glucose-Capillary 353 (H) 70 - 99 mg/dL  Glucose, capillary     Status: Abnormal   Collection Time: 10/26/21  4:15 AM  Result Value Ref Range   Glucose-Capillary 384 (H) 70 - 99 mg/dL  CBC     Status: Abnormal   Collection Time: 10/26/21  7:29 AM  Result Value Ref Range   WBC 20.2 (H) 4.0 - 10.5 K/uL   RBC 2.63 (L) 4.22 - 5.81 MIL/uL   Hemoglobin 8.1 (L) 13.0 - 17.0 g/dL   HCT 24.0 (L) 39.0 - 52.0 %   MCV 91.3 80.0 - 100.0 fL   MCH 30.8 26.0 - 34.0 pg   MCHC 33.8 30.0 - 36.0 g/dL   RDW 14.1 11.5 - 15.5 %   Platelets 152 150 - 400 K/uL   nRBC 0.0 0.0 - 0.2 %  Glucose, capillary     Status: Abnormal   Collection Time: 10/26/21  7:52 AM  Result Value Ref Range   Glucose-Capillary 360 (H) 70  - 99 mg/dL  Provider-confirm verbal Blood Bank order - FFP, Type & Screen, RBC; 3 Units; Order taken: 11/09/2021; Level 1 Trauma, Emergency Release     Status: None   Collection Time: 10/26/21  8:08 AM  Result Value Ref Range   Blood product order confirm      MD AUTHORIZATION REQUESTED Performed at [facility], [place] [address], [place], [place] [address]   I-STAT 7, (LYTES, BLD GAS, ICA, H+H)     Status: Abnormal   Collection Time: 10/26/21  8:15 AM  Result Value Ref Range   pH, Arterial 7.240 (L) 7.35 - 7.45   pCO2 arterial 35.2 32 - 48 mmHg   pO2, Arterial 173 (H) 83 - 108 mmHg   Bicarbonate 14.9 (L) 20.0 - 28.0 mmol/L   TCO2 16 (L) 22 - 32 mmol/L   O2 Saturation 99 %   Acid-base deficit 11.0 (H) 0.0 - 2.0 mmol/L   Sodium 131 (L) 135 - 145 mmol/L   Potassium 4.6 3.5 - 5.1 mmol/L   Calcium, Ion 1.07 (L) 1.15 - 1.40 mmol/L   HCT 24.0 (L) 39.0 - 52.0 %   Hemoglobin 8.2 (L) 13.0 - 17.0 g/dL   Patient temperature 38.1 C    Collection site RADIAL, ALLEN'S TEST ACCEPTABLE    Drawn by RT    Sample type ARTERIAL     Assessment & Plan: The plan of care was discussed with the bedside nurse for the day, Marlowe Kays, who is in agreement with this plan and no additional concerns were raised.   Present on Admission:  Trauma    LOS: 1 day   Additional comments:I reviewed the patient's new clinical lab test results.   and I reviewed the patients new imaging test results.    PHBC   SAH/SDH - NSGY c/s, Dr. Ronnald Ramp. Keppra x7 days for seizure ppx, repeat head CT 10/5 stable Frontal Scalp laceration - no underlying fx. Washout and closure per EDP. Will need staple removal 10/12 B/l nasal bone fxs - ENT c/s, Dr. Marcelline Deist. Nose blowing precautions and outpatient follow up  R rib fx 3-8 - no PTX on  CT, CXR today B/l pubic rami fxs and R acetabular fx - ortho c/s TP fx L5, S1 - pain control Degloving of b/l lower extremities - ortho consult, Dr. Sharol Given, s/p I&D and vac placement  10/4 Hyperglycemia - refractory to SSI, start insulin gtt Shock - undifferentiated, likely at least some component of hypovolemia initially, but this has been corrected. Echo today to r/o cardiogenic. Acidemia - 1amp bicarb Myoglobinuria vs rhabdomyolysis - suspect this is from extensive soft tissue injury, aggressive hydration with goal 1cc/kg/h UOP, trend creatinine  FEN - NPO, OGT, MIVF DVT - SCDs, hold LMWH given ICH H/o HTN H/o paroxsymal SVT H/o prostate cancer Neurogenic bladder - foley in ED Macular degeneration w/ h/o retinal detachment and defect Dispo - ICU  Critical Care Total Time: 45 minutes  Jesusita Oka, MD Trauma & General Surgery Please use [email or web address] to contact on call provider  10/26/2021  *Care during the described time interval was provided by me. I have reviewed this patient's available data, including medical history, events of note, physical examination and test results as part of my evaluation.

## 2021-10-26 NOTE — Progress Notes (Signed)
CRITICAL CARE Performed by: Ileana Roup   Total critical care time: 100 minutes  Critical care time was exclusive of separately billable procedures and treating other patients.  Critical care was necessary to treat or prevent imminent or life-threatening deterioration.  Critical care was time spent personally by me on the following activities: development of treatment plan with patient and/or surrogate as well as nursing, discussions with consultants, evaluation of patient's response to treatment, examination of patient, obtaining history from patient or surrogate, ordering and performing treatments and interventions, ordering and review of laboratory studies, ordering and review of radiographic studies, pulse oximetry and re-evaluation of patient's condition.

## 2021-10-26 NOTE — Progress Notes (Signed)
ABG results shown to Dr Bobbye Morton. No new orders at this time.
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
## 2021-10-26 NOTE — Progress Notes (Signed)
PT Cancellation Note  Patient Details Name: Micheal [name] MRN: [id number] DOB: 27-Feb-1928   Cancelled Treatment:    Reason Eval/Treat Not Completed: Patient not medically ready; noted orthopedic evaluation and treatment of multiple LE fractures ongoing.  Will await updated orders as per ortho prior to mobilizing.  OOB.  Recommend nursing staff provide ROM activities during daily care. Will follow along for readiness for mobility.   Reginia Naas 10/26/2021, 7:58 AM Magda Kiel, PT Acute Rehabilitation Services Office:[phone] 10/26/2021
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
## 2021-10-27 ENCOUNTER — Inpatient Hospital Stay: Payer: Self-pay

## 2021-10-27 ENCOUNTER — Inpatient Hospital Stay (HOSPITAL_COMMUNITY): Payer: Medicare Other

## 2021-10-27 LAB — PREPARE FRESH FROZEN PLASMA: Unit division: 0

## 2021-10-27 LAB — CBC WITH DIFFERENTIAL/PLATELET
Abs Immature Granulocytes: 0.13 10*3/uL — ABNORMAL HIGH (ref 0.00–0.07)
Basophils Absolute: 0 10*3/uL (ref 0.0–0.1)
Basophils Relative: 0 %
Eosinophils Absolute: 0.1 10*3/uL (ref 0.0–0.5)
Eosinophils Relative: 0 %
HCT: 21.7 % — ABNORMAL LOW (ref 39.0–52.0)
Hemoglobin: 7.3 g/dL — ABNORMAL LOW (ref 13.0–17.0)
Immature Granulocytes: 1 %
Lymphocytes Relative: 7 %
Lymphs Abs: 1 10*3/uL (ref 0.7–4.0)
MCH: 30 pg (ref 26.0–34.0)
MCHC: 33.6 g/dL (ref 30.0–36.0)
MCV: 89.3 fL (ref 80.0–100.0)
Monocytes Absolute: 1.4 10*3/uL — ABNORMAL HIGH (ref 0.1–1.0)
Monocytes Relative: 10 %
Neutro Abs: 11.9 10*3/uL — ABNORMAL HIGH (ref 1.7–7.7)
Neutrophils Relative %: 82 %
Platelets: 129 10*3/uL — ABNORMAL LOW (ref 150–400)
RBC: 2.43 MIL/uL — ABNORMAL LOW (ref 4.22–5.81)
RDW: 14 % (ref 11.5–15.5)
WBC: 14.6 10*3/uL — ABNORMAL HIGH (ref 4.0–10.5)
nRBC: 0 % (ref 0.0–0.2)

## 2021-10-27 LAB — COMPREHENSIVE METABOLIC PANEL
ALT: 596 U/L — ABNORMAL HIGH (ref 0–44)
AST: 685 U/L — ABNORMAL HIGH (ref 15–41)
Albumin: 2.7 g/dL — ABNORMAL LOW (ref 3.5–5.0)
Alkaline Phosphatase: 64 U/L (ref 38–126)
Anion gap: 6 (ref 5–15)
BUN: 12 mg/dL (ref 8–23)
CO2: 19 mmol/L — ABNORMAL LOW (ref 22–32)
Calcium: 7 mg/dL — ABNORMAL LOW (ref 8.9–10.3)
Chloride: 110 mmol/L (ref 98–111)
Creatinine, Ser: 0.85 mg/dL (ref 0.61–1.24)
GFR, Estimated: >60 mL/min (ref >60)
Glucose, Bld: 133 mg/dL — ABNORMAL HIGH (ref 70–99)
Potassium: 4.3 mmol/L (ref 3.5–5.1)
Sodium: 135 mmol/L (ref 135–145)
Total Bilirubin: 1.3 mg/dL — ABNORMAL HIGH (ref 0.3–1.2)
Total Protein: 4.4 g/dL — ABNORMAL LOW (ref 6.5–8.1)

## 2021-10-27 LAB — BPAM FFP
Blood Product Expiration Date: 202310102359
ISSUE DATE / TIME: 202310050105
Unit Type and Rh: 8400

## 2021-10-27 LAB — GLUCOSE, CAPILLARY
Glucose-Capillary: 119 mg/dL — ABNORMAL HIGH (ref 70–99)
Glucose-Capillary: 123 mg/dL — ABNORMAL HIGH (ref 70–99)
Glucose-Capillary: 124 mg/dL — ABNORMAL HIGH (ref 70–99)
Glucose-Capillary: 135 mg/dL — ABNORMAL HIGH (ref 70–99)
Glucose-Capillary: 140 mg/dL — ABNORMAL HIGH (ref 70–99)
Glucose-Capillary: 141 mg/dL — ABNORMAL HIGH (ref 70–99)
Glucose-Capillary: 144 mg/dL — ABNORMAL HIGH (ref 70–99)
Glucose-Capillary: 149 mg/dL — ABNORMAL HIGH (ref 70–99)
Glucose-Capillary: 150 mg/dL — ABNORMAL HIGH (ref 70–99)
Glucose-Capillary: 151 mg/dL — ABNORMAL HIGH (ref 70–99)
Glucose-Capillary: 156 mg/dL — ABNORMAL HIGH (ref 70–99)
Glucose-Capillary: 160 mg/dL — ABNORMAL HIGH (ref 70–99)
Glucose-Capillary: 172 mg/dL — ABNORMAL HIGH (ref 70–99)
Glucose-Capillary: 174 mg/dL — ABNORMAL HIGH (ref 70–99)

## 2021-10-27 MED ORDER — VITAL HIGH PROTEIN PO LIQD
1000.0000 mL | ORAL | Status: DC
  Administered 2021-10-28: 1000 mL

## 2021-10-27 MED ORDER — SODIUM CHLORIDE 0.9% FLUSH: 10.0000 mL | INTRAVENOUS | Status: DC | PRN

## 2021-10-27 MED ORDER — CHLORHEXIDINE GLUCONATE CLOTH 2 % EX PADS
6.0000 | MEDICATED_PAD | Freq: Every day | CUTANEOUS | Status: DC
  Administered 2021-10-27 – 2021-11-03 (×6): 6 via TOPICAL

## 2021-10-27 MED ORDER — SODIUM CHLORIDE 0.9 % IV BOLUS
1000.0000 mL | Freq: Once | INTRAVENOUS | Status: AC
  Administered 2021-10-27: 1000 mL via INTRAVENOUS

## 2021-10-27 MED ORDER — CALCIUM GLUCONATE-NACL 1-0.675 GM/50ML-% IV SOLN
1.0000 g | Freq: Once | INTRAVENOUS | Status: AC
  Administered 2021-10-27: 1000 mg via INTRAVENOUS
  Filled 2021-10-27: qty 50

## 2021-10-27 MED ORDER — FUROSEMIDE 10 MG/ML IJ SOLN
10.0000 mg | Freq: Once | INTRAMUSCULAR | Status: AC
  Administered 2021-10-27: 10 mg via INTRAVENOUS
  Filled 2021-10-27: qty 2

## 2021-10-27 MED ORDER — SODIUM CHLORIDE 0.9% FLUSH
10.0000 mL | Freq: Two times a day (BID) | INTRAVENOUS | Status: DC
  Administered 2021-10-27 – 2021-10-28 (×3): 10 mL
  Administered 2021-10-29: 30 mL
  Administered 2021-10-29 – 2021-10-31 (×3): 10 mL
  Administered 2021-11-01 (×2): 20 mL
  Administered 2021-11-02: 10 mL
  Administered 2021-11-02: 15 mL
  Administered 2021-11-03: 10 mL

## 2021-10-27 MED ORDER — INSULIN ASPART 100 UNIT/ML IJ SOLN
2.0000 [IU] | INTRAMUSCULAR | Status: DC
  Administered 2021-10-27 (×3): 4 [IU] via SUBCUTANEOUS
  Administered 2021-10-28: 2 [IU] via SUBCUTANEOUS
  Administered 2021-10-28: 4 [IU] via SUBCUTANEOUS
  Administered 2021-10-28: 2 [IU] via SUBCUTANEOUS
  Administered 2021-10-28: 4 [IU] via SUBCUTANEOUS
  Administered 2021-10-28: 2 [IU] via SUBCUTANEOUS
  Administered 2021-10-29 (×3): 6 [IU] via SUBCUTANEOUS

## 2021-10-27 MED ORDER — INSULIN DETEMIR 100 UNIT/ML ~~LOC~~ SOLN
8.0000 [IU] | Freq: Two times a day (BID) | SUBCUTANEOUS | Status: DC
  Administered 2021-10-27 – 2021-10-29 (×5): 8 [IU] via SUBCUTANEOUS
  Filled 2021-10-27 (×6): qty 0.08
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
## 2021-10-27 NOTE — Progress Notes (Signed)
OG advanced deeper into stomach per radiology. OG at 59cm

## 2021-10-27 NOTE — Progress Notes (Signed)
2cc Fentanyl wasted, witnessed by Normajean Baxter, RN.

## 2021-10-27 NOTE — Progress Notes (Signed)
Trauma/Critical Care Follow Up Note  Subjective:    Overnight Issues:   Objective:  Vital signs for last 24 hours: Temp:  [96.6 F (35.9 C)-98.6 F (37 C)] 98.4 F (36.9 C) (10/06 1500) Pulse Rate:  [72-123] 90 (10/06 1500) Resp:  [20-23] 20 (10/06 1500) BP: (109-162)/(46-138) 111/53 (10/06 1430) SpO2:  [89 %-100 %] 99 % (10/06 1500) Arterial Line BP: (93-274)/(30-263) 138/58 (10/06 1500) FiO2 (%):  [40 %] 40 % (10/06 1227)  Hemodynamic parameters for last 24 hours:    Intake/Output from previous day: 10/05 0701 - 10/06 0700 In: 4775.7 [I.V.:3062.4; IV Piggyback:1713.3] Out: 1810 [Urine:1285; Drains:525]  Intake/Output this shift: Total I/O In: 2270.4 [I.V.:928.8; IV Piggyback:1341.6] Out: 450 [Urine:450]  Vent settings for last 24 hours: Vent Mode: PRVC FiO2 (%):  [40 %] 40 % Set Rate:  [20 bmp] 20 bmp Vt Set:  [520 mL] 520 mL PEEP:  [5 cmH20] 5 cmH20 Plateau Pressure:  [14 cmH20-16 cmH20] 16 cmH20  Physical Exam:  Gen: comfortable, no distress Neuro: non-focal exam HEENT: PERRL Neck: supple CV: RRR Pulm: unlabored breathing Abd: soft, NT GU: clear yellow urine Extr: wwp, no edema   Results for orders placed or performed during the hospital encounter of 10/27/2021 (from the past 24 hour(s))  Glucose, capillary     Status: Abnormal   Collection Time: 10/26/21  4:59 PM  Result Value Ref Range   Glucose-Capillary 110 (H) 70 - 99 mg/dL  Glucose, capillary     Status: Abnormal   Collection Time: 10/26/21  6:12 PM  Result Value Ref Range   Glucose-Capillary 119 (H) 70 - 99 mg/dL   Comment 1 Notify RN   Glucose, capillary     Status: Abnormal   Collection Time: 10/26/21  7:17 PM  Result Value Ref Range   Glucose-Capillary 124 (H) 70 - 99 mg/dL  Glucose, capillary     Status: Abnormal   Collection Time: 10/26/21  8:21 PM  Result Value Ref Range   Glucose-Capillary 125 (H) 70 - 99 mg/dL  Glucose, capillary     Status: Abnormal   Collection Time:  10/26/21  9:41 PM  Result Value Ref Range   Glucose-Capillary 124 (H) 70 - 99 mg/dL  Glucose, capillary     Status: Abnormal   Collection Time: 10/26/21 10:42 PM  Result Value Ref Range   Glucose-Capillary 126 (H) 70 - 99 mg/dL  Glucose, capillary     Status: None   Collection Time: 10/26/21 11:45 PM  Result Value Ref Range   Glucose-Capillary 78 70 - 99 mg/dL  Glucose, capillary     Status: Abnormal   Collection Time: 10/27/21 12:14 AM  Result Value Ref Range   Glucose-Capillary 124 (H) 70 - 99 mg/dL  Glucose, capillary     Status: Abnormal   Collection Time: 10/27/21  1:15 AM  Result Value Ref Range   Glucose-Capillary 135 (H) 70 - 99 mg/dL  Glucose, capillary     Status: Abnormal   Collection Time: 10/27/21  2:20 AM  Result Value Ref Range   Glucose-Capillary 149 (H) 70 - 99 mg/dL  Glucose, capillary     Status: Abnormal   Collection Time: 10/27/21  3:26 AM  Result Value Ref Range   Glucose-Capillary 141 (H) 70 - 99 mg/dL  Glucose, capillary     Status: Abnormal   Collection Time: 10/27/21  4:29 AM  Result Value Ref Range   Glucose-Capillary 150 (H) 70 - 99 mg/dL  Glucose, capillary  Status: Abnormal   Collection Time: 10/27/21  6:05 AM  Result Value Ref Range   Glucose-Capillary 144 (H) 70 - 99 mg/dL  CBC with Differential/Platelet     Status: Abnormal   Collection Time: 10/27/21  6:13 AM  Result Value Ref Range   WBC 14.6 (H) 4.0 - 10.5 K/uL   RBC 2.43 (L) 4.22 - 5.81 MIL/uL   Hemoglobin 7.3 (L) 13.0 - 17.0 g/dL   HCT 21.7 (L) 39.0 - 52.0 %   MCV 89.3 80.0 - 100.0 fL   MCH 30.0 26.0 - 34.0 pg   MCHC 33.6 30.0 - 36.0 g/dL   RDW 14.0 11.5 - 15.5 %   Platelets 129 (L) 150 - 400 K/uL   nRBC 0.0 0.0 - 0.2 %   Neutrophils Relative % 82 %   Neutro Abs 11.9 (H) 1.7 - 7.7 K/uL   Lymphocytes Relative 7 %   Lymphs Abs 1.0 0.7 - 4.0 K/uL   Monocytes Relative 10 %   Monocytes Absolute 1.4 (H) 0.1 - 1.0 K/uL   Eosinophils Relative 0 %   Eosinophils Absolute 0.1 0.0 -  0.5 K/uL   Basophils Relative 0 %   Basophils Absolute 0.0 0.0 - 0.1 K/uL   Immature Granulocytes 1 %   Abs Immature Granulocytes 0.13 (H) 0.00 - 0.07 K/uL  Comprehensive metabolic panel     Status: Abnormal   Collection Time: 10/27/21  6:13 AM  Result Value Ref Range   Sodium 135 135 - 145 mmol/L   Potassium 4.3 3.5 - 5.1 mmol/L   Chloride 110 98 - 111 mmol/L   CO2 19 (L) 22 - 32 mmol/L   Glucose, Bld 133 (H) 70 - 99 mg/dL   BUN 12 8 - 23 mg/dL   Creatinine, Ser 0.85 0.61 - 1.24 mg/dL   Calcium 7.0 (L) 8.9 - 10.3 mg/dL   Total Protein 4.4 (L) 6.5 - 8.1 g/dL   Albumin 2.7 (L) 3.5 - 5.0 g/dL   AST 685 (H) 15 - 41 U/L   ALT 596 (H) 0 - 44 U/L   Alkaline Phosphatase 64 38 - 126 U/L   Total Bilirubin 1.3 (H) 0.3 - 1.2 mg/dL   GFR, Estimated >60 >60 mL/min   Anion gap 6 5 - 15  Glucose, capillary     Status: Abnormal   Collection Time: 10/27/21  8:06 AM  Result Value Ref Range   Glucose-Capillary 140 (H) 70 - 99 mg/dL  Glucose, capillary     Status: Abnormal   Collection Time: 10/27/21 10:28 AM  Result Value Ref Range   Glucose-Capillary 123 (H) 70 - 99 mg/dL  Glucose, capillary     Status: Abnormal   Collection Time: 10/27/21 11:44 AM  Result Value Ref Range   Glucose-Capillary 174 (H) 70 - 99 mg/dL  Glucose, capillary     Status: Abnormal   Collection Time: 10/27/21 11:45 AM  Result Value Ref Range   Glucose-Capillary 151 (H) 70 - 99 mg/dL  Glucose, capillary     Status: Abnormal   Collection Time: 10/27/21  3:31 PM  Result Value Ref Range   Glucose-Capillary 172 (H) 70 - 99 mg/dL    Assessment & Plan: The plan of care was discussed with the bedside nurse for the day, who is in agreement with this plan and no additional concerns were raised.   Present on Admission:  Trauma    LOS: 2 days   Additional comments:I reviewed the patient's new clinical lab test results.  and I reviewed the patients new imaging test results.    PHBC   SAH/SDH - NSGY c/s, Dr. Ronnald Ramp.  Keppra x7 days for seizure ppx, repeat head CT 10/5 stable Frontal Scalp laceration - no underlying fx. Washout and closure per EDP. Will need staple removal 10/12 B/l nasal bone fxs - ENT c/s, Dr. Marcelline Deist. Nose blowing precautions and outpatient follow up  R rib fx 3-8 - no PTX on CT B/l pubic rami fxs and R acetabular fx - ortho c/s TP fx L5, S1 - pain control Degloving of b/l lower extremities - ortho consult, Dr. Sharol Given, s/p I&D and vac placement 10/4 Hyperglycemia - improved with insulin gtt, convert back to SSI Shock - resolving Transaminitis - d/c tylenol Myoglobinuria vs rhabdomyolysis - suspect this is from extensive soft tissue injury, aggressive hydration with goal 1cc/kg/h UOP, trend creatinine FEN - NPO, OGT, MIVF DVT - SCDs, hold LMWH given ICH H/o HTN H/o paroxsymal SVT H/o prostate cancer Neurogenic bladder - foley in ED Macular degeneration w/ h/o retinal detachment and defect Dispo - ICU  Critical Care Total Time: 40 minutes  Jesusita Oka, MD Trauma & General Surgery Please use [email or web address] to contact on call provider  10/27/2021  *Care during the described time interval was provided by me. I have reviewed this patient's available data, including medical history, events of note, physical examination and test results as part of my evaluation.

## 2021-10-27 NOTE — Progress Notes (Signed)
Pt urine output less than goal of 84m/kg/hr, MD aware. Instructed to notify MD if output less than .552mkg/hr.   Also instructed to hold tube feeds until levo less than 1029mmin. Will monitor.

## 2021-10-27 NOTE — Inpatient Diabetes Management (Signed)
Inpatient Diabetes Program Recommendations  AACE/ADA: New Consensus Statement on Inpatient Glycemic Control (2015)  Target Ranges:  Prepandial:   less than 140 mg/dL      Peak postprandial:   less than 180 mg/dL (1-2 hours)      Critically ill patients:  140 - 180 mg/dL   Lab Results  Component Value Date   GLUCAP 123 (H) 10/27/2021   HGBA1C 6.8 (H) 10/24/2021    Review of Glycemic Control  Diabetes history: DM2 Outpatient Diabetes medications: Lantus 46 units q am, Humalog 6-10 units bid meal coverage Current orders for Inpatient glycemic control: Levemir 8 units bid, Novolog 2-6 units q 4 hrs., Solucortel 100 mg q 12 hrs.  Inpatient Diabetes Program Recommendations:   Noted patient now transitioned to [facility] insulin. Will follow during hospitalization and assist as needed.  Thank you, Nani Gasser. Albert Devaul, RN, MSN, CDE  Diabetes Coordinator Inpatient Glycemic Control Team Team Pager [phone] (8am-5pm) 10/27/2021 11:18 AM

## 2021-10-27 NOTE — Progress Notes (Signed)
Orthopaedic Trauma Service   BP (!) 119/57   Pulse 97   Temp 98.6 F (37 C)   Resp 20   Ht '5\' 7"'$  (1.702 m)   Wt 71.2 kg   SpO2 100%   BMI 24.59 kg/m    Pt intubated Complex soft tissues injuries B UEx, B LEx and abdomen   Alignment of pelvic ring fractures look good R ankle fractures are well aligned  Severe soft tissue injury to R leg precludes bracing at this point   Plan for non-op of pelvis and R ankle  TDWB R leg once pt able to start mobilizing with therapies   Will follow along  Please let us know when pt is extubated  Baseline xrays of pelvis and R ankle   Jari Pigg, PA-C [phone] (C) 10/27/2021, 10:46 AM  [facility] [place] [phone] Domingo Sep (F)      Patient ID: Micheal [name], male   DOB: 02-Sep-1928, 88 y.o.   MRN: [id number]

## 2021-10-27 NOTE — Progress Notes (Signed)
Peripherally Inserted Central Catheter Placement  The IV Nurse has discussed with the patient and/or persons authorized to consent for the patient, the purpose of this procedure and the potential benefits and risks involved with this procedure.  The benefits include less needle sticks, lab draws from the catheter, and the patient may be discharged home with the catheter. Risks include, but not limited to, infection, bleeding, blood clot (thrombus formation), and puncture of an artery; nerve damage and irregular heartbeat and possibility to perform a PICC exchange if needed/ordered by physician.  Alternatives to this procedure were also discussed.  Bard Power PICC patient education guide, fact sheet on infection prevention and patient information card has been provided to patient /or left at bedside.    PICC Placement Documentation  PICC Triple Lumen 25/36/64 Left Cephalic 43 cm 1 cm (Active)  Indication for Insertion or Continuance of Line Vasoactive infusions 10/27/21 1458  Exposed Catheter (cm) 1 cm 10/27/21 1458  Site Assessment Clean, Dry, Intact 10/27/21 1458  Lumen #1 Status Flushed;Saline locked;Blood return noted 10/27/21 1458  Lumen #2 Status Flushed;Saline locked;Blood return noted 10/27/21 1458  Lumen #3 Status Flushed;Saline locked;Blood return noted 10/27/21 1458  Dressing Type Transparent;Securing device 10/27/21 1458  Dressing Status Antimicrobial disc in place 10/27/21 1458  Safety Lock Not Applicable 40/34/74 2595  Line Adjustment (NICU/IV Team Only) No 10/27/21 1458  Dressing Intervention New dressing;Other (Comment) 10/27/21 1458  Dressing Change Due 11-14-2021 10/27/21 1458    Patient's daughter, Telford Nab, signed PICC consent. 2 Person assist for PICC placement. NO basilic and brachial veins seen. Multiple bruises and weeping below the site.      Enos Fling 10/27/2021, 3:00 PM

## 2021-10-28 LAB — POCT I-STAT 7, (LYTES, BLD GAS, ICA,H+H)
Acid-base deficit: 7 mmol/L — ABNORMAL HIGH (ref 0.0–2.0)
Bicarbonate: 18.4 mmol/L — ABNORMAL LOW (ref 20.0–28.0)
Calcium, Ion: 1.1 mmol/L — ABNORMAL LOW (ref 1.15–1.40)
HCT: 21 % — ABNORMAL LOW (ref 39.0–52.0)
Hemoglobin: 7.1 g/dL — ABNORMAL LOW (ref 13.0–17.0)
O2 Saturation: 97 %
Patient temperature: 36.9
Potassium: 3.8 mmol/L (ref 3.5–5.1)
Sodium: 139 mmol/L (ref 135–145)
TCO2: 19 mmol/L — ABNORMAL LOW (ref 22–32)
pCO2 arterial: 33.7 mmHg (ref 32–48)
pH, Arterial: 7.346 — ABNORMAL LOW (ref 7.35–7.45)
pO2, Arterial: 98 mmHg (ref 83–108)

## 2021-10-28 LAB — CBC WITH DIFFERENTIAL/PLATELET
Abs Immature Granulocytes: 0.13 10*3/uL — ABNORMAL HIGH (ref 0.00–0.07)
Basophils Absolute: 0 10*3/uL (ref 0.0–0.1)
Basophils Relative: 0 %
Eosinophils Absolute: 0 10*3/uL (ref 0.0–0.5)
Eosinophils Relative: 0 %
HCT: 20.1 % — ABNORMAL LOW (ref 39.0–52.0)
Hemoglobin: 6.7 g/dL — CL (ref 13.0–17.0)
Immature Granulocytes: 1 %
Lymphocytes Relative: 7 %
Lymphs Abs: 0.8 10*3/uL (ref 0.7–4.0)
MCH: 30 pg (ref 26.0–34.0)
MCHC: 33.3 g/dL (ref 30.0–36.0)
MCV: 90.1 fL (ref 80.0–100.0)
Monocytes Absolute: 1 10*3/uL (ref 0.1–1.0)
Monocytes Relative: 8 %
Neutro Abs: 10.2 10*3/uL — ABNORMAL HIGH (ref 1.7–7.7)
Neutrophils Relative %: 84 %
Platelets: 123 10*3/uL — ABNORMAL LOW (ref 150–400)
RBC: 2.23 MIL/uL — ABNORMAL LOW (ref 4.22–5.81)
RDW: 13.9 % (ref 11.5–15.5)
WBC: 12.1 10*3/uL — ABNORMAL HIGH (ref 4.0–10.5)
nRBC: 0 % (ref 0.0–0.2)

## 2021-10-28 LAB — GLUCOSE, CAPILLARY
Glucose-Capillary: 127 mg/dL — ABNORMAL HIGH (ref 70–99)
Glucose-Capillary: 144 mg/dL — ABNORMAL HIGH (ref 70–99)
Glucose-Capillary: 144 mg/dL — ABNORMAL HIGH (ref 70–99)
Glucose-Capillary: 170 mg/dL — ABNORMAL HIGH (ref 70–99)
Glucose-Capillary: 203 mg/dL — ABNORMAL HIGH (ref 70–99)
Glucose-Capillary: 82 mg/dL (ref 70–99)

## 2021-10-28 LAB — COMPREHENSIVE METABOLIC PANEL
ALT: 718 U/L — ABNORMAL HIGH (ref 0–44)
AST: 584 U/L — ABNORMAL HIGH (ref 15–41)
Albumin: 2.1 g/dL — ABNORMAL LOW (ref 3.5–5.0)
Alkaline Phosphatase: 77 U/L (ref 38–126)
Anion gap: 8 (ref 5–15)
BUN: 13 mg/dL (ref 8–23)
CO2: 19 mmol/L — ABNORMAL LOW (ref 22–32)
Calcium: 7.2 mg/dL — ABNORMAL LOW (ref 8.9–10.3)
Chloride: 111 mmol/L (ref 98–111)
Creatinine, Ser: 0.98 mg/dL (ref 0.61–1.24)
GFR, Estimated: >60 mL/min (ref >60)
Glucose, Bld: 100 mg/dL — ABNORMAL HIGH (ref 70–99)
Potassium: 3.5 mmol/L (ref 3.5–5.1)
Sodium: 138 mmol/L (ref 135–145)
Total Bilirubin: 0.9 mg/dL (ref 0.3–1.2)
Total Protein: 4 g/dL — ABNORMAL LOW (ref 6.5–8.1)

## 2021-10-28 LAB — CULTURE, RESPIRATORY W GRAM STAIN: Culture: NORMAL

## 2021-10-28 LAB — BASIC METABOLIC PANEL
Anion gap: 7 (ref 5–15)
BUN: 15 mg/dL (ref 8–23)
CO2: 19 mmol/L — ABNORMAL LOW (ref 22–32)
Calcium: 7.5 mg/dL — ABNORMAL LOW (ref 8.9–10.3)
Chloride: 111 mmol/L (ref 98–111)
Creatinine, Ser: 0.96 mg/dL (ref 0.61–1.24)
GFR, Estimated: >60 mL/min (ref >60)
Glucose, Bld: 158 mg/dL — ABNORMAL HIGH (ref 70–99)
Potassium: 3.2 mmol/L — ABNORMAL LOW (ref 3.5–5.1)
Sodium: 137 mmol/L (ref 135–145)

## 2021-10-28 LAB — PREPARE RBC (CROSSMATCH)

## 2021-10-28 LAB — HEMOGLOBIN AND HEMATOCRIT, BLOOD
HCT: 27.1 % — ABNORMAL LOW (ref 39.0–52.0)
Hemoglobin: 9 g/dL — ABNORMAL LOW (ref 13.0–17.0)

## 2021-10-28 MED ORDER — ENOXAPARIN SODIUM 30 MG/0.3ML IJ SOSY
30.0000 mg | PREFILLED_SYRINGE | Freq: Two times a day (BID) | INTRAMUSCULAR | Status: DC
  Administered 2021-10-28 – 2021-10-29 (×2): 30 mg via SUBCUTANEOUS
  Filled 2021-10-28 (×2): qty 0.3

## 2021-10-28 MED ORDER — FUROSEMIDE 10 MG/ML IJ SOLN
20.0000 mg | Freq: Once | INTRAMUSCULAR | Status: AC
  Administered 2021-10-28: 20 mg via INTRAVENOUS
  Filled 2021-10-28: qty 2

## 2021-10-28 MED ORDER — HYDROCORTISONE SOD SUC (PF) 100 MG IJ SOLR
100.0000 mg | Freq: Every day | INTRAMUSCULAR | Status: DC
  Administered 2021-10-29 – 2021-10-31 (×3): 100 mg via INTRAVENOUS
  Filled 2021-10-28 (×2): qty 2

## 2021-10-28 MED ORDER — CALCIUM GLUCONATE-NACL 2-0.675 GM/100ML-% IV SOLN
2.0000 g | Freq: Once | INTRAVENOUS | Status: AC
  Administered 2021-10-28: 2000 mg via INTRAVENOUS
  Filled 2021-10-28: qty 100

## 2021-10-28 MED ORDER — VITAL 1.5 CAL PO LIQD: 1000.0000 mL | ORAL | Status: DC

## 2021-10-28 MED ORDER — POLYETHYLENE GLYCOL 3350 17 G PO PACK: 17.0000 g | PACK | Freq: Every day | ORAL | Status: DC

## 2021-10-28 MED ORDER — SODIUM CHLORIDE 0.9% IV SOLUTION: Freq: Once | INTRAVENOUS | Status: AC

## 2021-10-28 MED ORDER — POLYETHYLENE GLYCOL 3350 17 G PO PACK
17.0000 g | PACK | Freq: Every day | ORAL | Status: DC
  Administered 2021-10-29 – 2021-11-03 (×6): 17 g
  Filled 2021-10-28 (×6): qty 1

## 2021-10-28 MED ORDER — VITAL 1.5 CAL PO LIQD
1000.0000 mL | ORAL | Status: DC
  Administered 2021-10-29 – 2021-11-01 (×2): 1000 mL
  Filled 2021-10-28 (×3): qty 1000

## 2021-10-28 NOTE — Progress Notes (Signed)
..  Trauma Event Note   TMD Redmond Pulling made aware of decreased urine output, no new orders at this time.  Primary Nurse made aware.   Last imported Vital Signs BP (!) 107/59   Pulse (!) 121   Temp 99.5 F (37.5 C)   Resp 20   Ht '5\' 7"'$  (1.702 m)   Wt 157 lb (71.2 kg)   SpO2 91%   BMI 24.59 kg/m   Trending CBC Recent Labs    11/04/2021 2308 10/26/21 0729 10/26/21 0815 10/27/21 0613  WBC 18.8* 20.2*  --  14.6*  HGB 9.3* 8.1* 8.2* 7.3*  HCT 27.1* 24.0* 24.0* 21.7*  PLT 165  160 152  --  129*    Trending Coag's Recent Labs    11/08/2021 0831 11/05/2021 2308  APTT  --  36  INR 1.1 1.4*    Trending BMET Recent Labs    11/13/2021 2308 10/26/21 0815 10/26/21 0913 10/27/21 0613  NA 136 131* 131* 135  K 3.6 4.6 4.5 4.3  CL 108  --  105 110  CO2 18*  --  15* 19*  BUN 12  --  11 12  CREATININE 1.10  --  1.33* 0.85  GLUCOSE 366*  --  370* 133*      Korvin Valentine Dee  Trauma Response RN  Please call TRN at [phone] for further assistance.

## 2021-10-28 NOTE — Progress Notes (Signed)
Nutrition Brief Note   RD received a consult to start trickle tube feeds. Please see full assessment note from 10/27/21. Discussed with RN. Pt has required TF to be held due to increased pressor requirements, but then TF is restarted when pressors reduce.   Interventions: -Tube Feeds via OG tube: Vital 1.5 at 20 mL/hr  Goal: Vital 1.5 at 60 mL/hr (increase by 10 mL q4h to goal) and ProSource TF 20 - BID Provides 2320 kcal, 134 gm protein, and 1080 mL fee water daily.     Micheal [name] RD, LDN Clinical Dietitian See Shea Evans for contact information.

## 2021-10-28 NOTE — Plan of Care (Signed)
  Problem: Fluid Volume: Goal: Ability to maintain a balanced intake and output will improve Outcome: Progressing   Problem: Nutritional: Goal: Maintenance of adequate nutrition will improve Outcome: Progressing   Problem: Skin Integrity: Goal: Risk for impaired skin integrity will decrease Outcome: Progressing

## 2021-10-28 NOTE — Progress Notes (Addendum)
Chaplain responded to RN request to offer spiritual support to wife. Wife appears deeply worried about pt's situation and expresses concern that pt did not want to live in this condition.  While wrestling with guilt over whether she would be "letting him go", wife also relays how pt recently and on more than one occasion discussed his wishes.  She states they both have living wills.  Chaplain facilitated storytelling while offering compassionate presence.  Following prayer (pt and husband are [facility]), during which wife was teary and distraught, one daughter, Juliann Pulse, arrived.  Of the three daughters and spouse, Juliann Pulse is reportedly the one family member focused on full-on intervention.  In the presence of this chaplain, wife repeated several reasons why she believed husband shouldn't have surgery, would not want to live "this way" and would not want aggressive intervention.  The daughter responded forcefully that this was not true because the pt is getting better (the doctor said so) and now is absolutely the time to continue with surgeries.  Each time the daughter spoke, the wife would turn to the chaplain with pleading eyes, as if to say, "You see?" Daughter did not appear open to hearing mother/pt's wife pov/concern.  Chaplain provided reflective listening and non-judgmental support.    Family would benefit from an opportunity for a family conference with support and clear communication about medical interventions vs quality of life questions.  Chaplains are available for ongoing support as needed.    Chaplain Dorathy Daft Pager:  [phone]    10/28/21 1400  Clinical Encounter Type  Visited With Patient and family together  Visit Type Initial;Spiritual support  Referral From Nurse  Consult/Referral To Chaplain  Spiritual Encounters  Spiritual Needs Prayer  Stress Factors  Patient Stress Factors Health changes  Family Stress Factors Family relationships

## 2021-10-28 NOTE — Progress Notes (Signed)
Trauma/Critical Care Follow Up Note  Subjective:    Overnight Issues:   Objective:  Vital signs for last 24 hours: Temp:  [96.8 F (36 C)-99.5 F (37.5 C)] 98.2 F (36.8 C) (10/07 1230) Pulse Rate:  [65-164] 88 (10/07 1230) Resp:  [11-22] 14 (10/07 1230) BP: (79-152)/(42-79) 141/67 (10/07 1230) SpO2:  [91 %-100 %] 100 % (10/07 1230) Arterial Line BP: (80-215)/(37-205) 128/58 (10/07 1153) FiO2 (%):  [40 %] 40 % (10/07 1118)  Hemodynamic parameters for last 24 hours:    Intake/Output from previous day: 10/06 0701 - 10/07 0700 In: 4612.2 [I.V.:2801.3; NG/GT:119.3; IV Piggyback:1691.6] Out: 1997 [Urine:1562; Drains:435]  Intake/Output this shift: Total I/O In: 1786.1 [I.V.:766.1; Blood:630; NG/GT:190; IV Piggyback:200] Out: 100 [Urine:100]  Vent settings for last 24 hours: Vent Mode: PRVC FiO2 (%):  [40 %] 40 % Set Rate:  [12 bmp-20 bmp] 12 bmp Vt Set:  [520 mL] 520 mL PEEP:  [5 cmH20] 5 cmH20 Plateau Pressure:  [15 cmH20-16 cmH20] 16 cmH20  Physical Exam:  Gen: comfortable, no distress Neuro: non-focal exam HEENT: PERRL Neck: supple CV: RRR Pulm: unlabored breathing Abd: soft, NT GU: clear yellow urine Extr: wwp, no edema   Results for orders placed or performed during the hospital encounter of 11/12/2021 (from the past 24 hour(s))  Glucose, capillary     Status: Abnormal   Collection Time: 10/27/21  3:31 PM  Result Value Ref Range   Glucose-Capillary 172 (H) 70 - 99 mg/dL  Glucose, capillary     Status: Abnormal   Collection Time: 10/27/21  7:58 PM  Result Value Ref Range   Glucose-Capillary 156 (H) 70 - 99 mg/dL  Glucose, capillary     Status: Abnormal   Collection Time: 10/27/21 11:55 PM  Result Value Ref Range   Glucose-Capillary 160 (H) 70 - 99 mg/dL  Glucose, capillary     Status: Abnormal   Collection Time: 10/28/21  3:14 AM  Result Value Ref Range   Glucose-Capillary 144 (H) 70 - 99 mg/dL  CBC with Differential/Platelet     Status: Abnormal    Collection Time: 10/28/21  6:00 AM  Result Value Ref Range   WBC 12.1 (H) 4.0 - 10.5 K/uL   RBC 2.23 (L) 4.22 - 5.81 MIL/uL   Hemoglobin 6.7 (LL) 13.0 - 17.0 g/dL   HCT 20.1 (L) 39.0 - 52.0 %   MCV 90.1 80.0 - 100.0 fL   MCH 30.0 26.0 - 34.0 pg   MCHC 33.3 30.0 - 36.0 g/dL   RDW 13.9 11.5 - 15.5 %   Platelets 123 (L) 150 - 400 K/uL   nRBC 0.0 0.0 - 0.2 %   Neutrophils Relative % 84 %   Neutro Abs 10.2 (H) 1.7 - 7.7 K/uL   Lymphocytes Relative 7 %   Lymphs Abs 0.8 0.7 - 4.0 K/uL   Monocytes Relative 8 %   Monocytes Absolute 1.0 0.1 - 1.0 K/uL   Eosinophils Relative 0 %   Eosinophils Absolute 0.0 0.0 - 0.5 K/uL   Basophils Relative 0 %   Basophils Absolute 0.0 0.0 - 0.1 K/uL   Immature Granulocytes 1 %   Abs Immature Granulocytes 0.13 (H) 0.00 - 0.07 K/uL  Comprehensive metabolic panel     Status: Abnormal   Collection Time: 10/28/21  6:00 AM  Result Value Ref Range   Sodium 138 135 - 145 mmol/L   Potassium 3.5 3.5 - 5.1 mmol/L   Chloride 111 98 - 111 mmol/L   CO2 19 (L)  22 - 32 mmol/L   Glucose, Bld 100 (H) 70 - 99 mg/dL   BUN 13 8 - 23 mg/dL   Creatinine, Ser 0.98 0.61 - 1.24 mg/dL   Calcium 7.2 (L) 8.9 - 10.3 mg/dL   Total Protein 4.0 (L) 6.5 - 8.1 g/dL   Albumin 2.1 (L) 3.5 - 5.0 g/dL   AST 584 (H) 15 - 41 U/L   ALT 718 (H) 0 - 44 U/L   Alkaline Phosphatase 77 38 - 126 U/L   Total Bilirubin 0.9 0.3 - 1.2 mg/dL   GFR, Estimated >60 >60 mL/min   Anion gap 8 5 - 15  Prepare RBC (crossmatch)     Status: None   Collection Time: 10/28/21  7:44 AM  Result Value Ref Range   Order Confirmation      ORDER PROCESSED BY BLOOD BANK Performed at [facility], [address] [place], [place] [address]   Glucose, capillary     Status: None   Collection Time: 10/28/21  8:39 AM  Result Value Ref Range   Glucose-Capillary 82 70 - 99 mg/dL  I-STAT 7, (LYTES, BLD GAS, ICA, H+H)     Status: Abnormal   Collection Time: 10/28/21 10:47 AM  Result Value Ref Range   pH,  Arterial 7.346 (L) 7.35 - 7.45   pCO2 arterial 33.7 32 - 48 mmHg   pO2, Arterial 98 83 - 108 mmHg   Bicarbonate 18.4 (L) 20.0 - 28.0 mmol/L   TCO2 19 (L) 22 - 32 mmol/L   O2 Saturation 97 %   Acid-base deficit 7.0 (H) 0.0 - 2.0 mmol/L   Sodium 139 135 - 145 mmol/L   Potassium 3.8 3.5 - 5.1 mmol/L   Calcium, Ion 1.10 (L) 1.15 - 1.40 mmol/L   HCT 21.0 (L) 39.0 - 52.0 %   Hemoglobin 7.1 (L) 13.0 - 17.0 g/dL   Patient temperature 36.9 C    Collection site art line    Drawn by RT    Sample type ARTERIAL   Glucose, capillary     Status: Abnormal   Collection Time: 10/28/21 11:53 AM  Result Value Ref Range   Glucose-Capillary 127 (H) 70 - 99 mg/dL    Assessment & Plan: The plan of care was discussed with the bedside nurse for the day, who is in agreement with this plan and no additional concerns were raised.   Present on Admission:  Trauma    LOS: 3 days   Additional comments:I reviewed the patient's new clinical lab test results.   and I reviewed the patients new imaging test results.    PHBC   SAH/SDH - NSGY c/s, Dr. Ronnald Ramp. Keppra x7 days for seizure ppx, repeat head CT 10/5 stable Frontal Scalp laceration - no underlying fx. Washout and closure per EDP. Will need staple removal 10/12 B/l nasal bone fxs - ENT c/s, Dr. Marcelline Deist. Nose blowing precautions and outpatient follow up  R rib fx 3-8 - no PTX on CT B/l pubic rami fxs, R acetabular fx, R ankle fx - ortho c/s, nonop, TDWB, will see again when extubated TP fx L5, S1 - pain control Degloving of b/l lower extremities - ortho consult, Dr. Sharol Given, s/p I&D and vac placement 10/4; plan for OR 10/11 ABLA - 2u pRBC today followed by lasix Hyperglycemia - off insulin gtt, now on SSI Shock - resolving Transaminitis - limit tylenol, trend Myoglobinuria vs rhabdomyolysis - suspect this is from extensive soft tissue injury, trend creatinine, which is  currently normal FEN - NPO, OGT, TF DVT - SCDs, start LMWH H/o HTN H/o paroxsymal  SVT H/o prostate cancer Neurogenic bladder - foley in ED Macular degeneration w/ h/o retinal detachment and defect Dispo - ICU    Critical Care Total Time: 40 minutes  Jesusita Oka, MD Trauma & General Surgery Please use [email or web address] to contact on call provider  10/28/2021  *Care during the described time interval was provided by me. I have reviewed this patient's available data, including medical history, events of note, physical examination and test results as part of my evaluation.

## 2021-10-28 NOTE — Progress Notes (Signed)
CRITICAL VALUE STICKER  CRITICAL VALUE: HGB 6.7  RECEIVER (on-site recipient of call): Trudee Grip, [place] NOTIFIED: 10/28/2021 0734  MESSENGER (representative from lab): Silva Bandy  MD NOTIFIED: Dr. Bobbye Morton  TIME OF NOTIFICATION: 0740  RESPONSE:  See new orders

## 2021-10-29 ENCOUNTER — Inpatient Hospital Stay (HOSPITAL_COMMUNITY): Payer: Medicare Other

## 2021-10-29 LAB — POCT I-STAT 7, (LYTES, BLD GAS, ICA,H+H)
Acid-base deficit: 3 mmol/L — ABNORMAL HIGH (ref 0.0–2.0)
Acid-base deficit: 3 mmol/L — ABNORMAL HIGH (ref 0.0–2.0)
Acid-base deficit: 1 mmol/L (ref 0.0–2.0)
Bicarbonate: 20.7 mmol/L (ref 20.0–28.0)
Bicarbonate: 19.4 mmol/L — ABNORMAL LOW (ref 20.0–28.0)
Bicarbonate: 25.9 mmol/L (ref 20.0–28.0)
Calcium, Ion: 1.1 mmol/L — ABNORMAL LOW (ref 1.15–1.40)
Calcium, Ion: 1.02 mmol/L — ABNORMAL LOW (ref 1.15–1.40)
Calcium, Ion: 1.12 mmol/L — ABNORMAL LOW (ref 1.15–1.40)
HCT: 33 % — ABNORMAL LOW (ref 39.0–52.0)
HCT: 29 % — ABNORMAL LOW (ref 39.0–52.0)
HCT: 35 % — ABNORMAL LOW (ref 39.0–52.0)
Hemoglobin: 11.2 g/dL — ABNORMAL LOW (ref 13.0–17.0)
Hemoglobin: 9.9 g/dL — ABNORMAL LOW (ref 13.0–17.0)
Hemoglobin: 11.9 g/dL — ABNORMAL LOW (ref 13.0–17.0)
O2 Saturation: 93 %
O2 Saturation: 90 %
O2 Saturation: 100 %
Patient temperature: 99
Patient temperature: 100
Patient temperature: 100
Potassium: 3.1 mmol/L — ABNORMAL LOW (ref 3.5–5.1)
Potassium: 3 mmol/L — ABNORMAL LOW (ref 3.5–5.1)
Potassium: 3.4 mmol/L — ABNORMAL LOW (ref 3.5–5.1)
Sodium: 142 mmol/L (ref 135–145)
Sodium: 144 mmol/L (ref 135–145)
Sodium: 145 mmol/L (ref 135–145)
TCO2: 22 mmol/L (ref 22–32)
TCO2: 20 mmol/L — ABNORMAL LOW (ref 22–32)
TCO2: 27 mmol/L (ref 22–32)
pCO2 arterial: 32.1 mmHg (ref 32–48)
pCO2 arterial: 26.1 mmHg — ABNORMAL LOW (ref 32–48)
pCO2 arterial: 53.7 mmHg — ABNORMAL HIGH (ref 32–48)
pH, Arterial: 7.419 (ref 7.35–7.45)
pH, Arterial: 7.483 — ABNORMAL HIGH (ref 7.35–7.45)
pH, Arterial: 7.296 — ABNORMAL LOW (ref 7.35–7.45)
pO2, Arterial: 64 mmHg — ABNORMAL LOW (ref 83–108)
pO2, Arterial: 56 mmHg — ABNORMAL LOW (ref 83–108)
pO2, Arterial: 239 mmHg — ABNORMAL HIGH (ref 83–108)

## 2021-10-29 LAB — CBC WITH DIFFERENTIAL/PLATELET
Abs Immature Granulocytes: 0.6 10*3/uL — ABNORMAL HIGH (ref 0.00–0.07)
Basophils Absolute: 0 10*3/uL (ref 0.0–0.1)
Basophils Relative: 0 %
Eosinophils Absolute: 0 10*3/uL (ref 0.0–0.5)
Eosinophils Relative: 0 %
HCT: 26 % — ABNORMAL LOW (ref 39.0–52.0)
Hemoglobin: 8.9 g/dL — ABNORMAL LOW (ref 13.0–17.0)
Immature Granulocytes: 6 %
Lymphocytes Relative: 8 %
Lymphs Abs: 0.8 10*3/uL (ref 0.7–4.0)
MCH: 29.9 pg (ref 26.0–34.0)
MCHC: 34.2 g/dL (ref 30.0–36.0)
MCV: 87.2 fL (ref 80.0–100.0)
Monocytes Absolute: 1 10*3/uL (ref 0.1–1.0)
Monocytes Relative: 10 %
Neutro Abs: 7.8 10*3/uL — ABNORMAL HIGH (ref 1.7–7.7)
Neutrophils Relative %: 76 %
Platelets: 88 10*3/uL — ABNORMAL LOW (ref 150–400)
RBC: 2.98 MIL/uL — ABNORMAL LOW (ref 4.22–5.81)
RDW: 14.9 % (ref 11.5–15.5)
WBC: 10.1 10*3/uL (ref 4.0–10.5)
nRBC: 1.4 % — ABNORMAL HIGH (ref 0.0–0.2)

## 2021-10-29 LAB — BASIC METABOLIC PANEL
Anion gap: 4 — ABNORMAL LOW (ref 5–15)
BUN: 16 mg/dL (ref 8–23)
CO2: 22 mmol/L (ref 22–32)
Calcium: 6.9 mg/dL — ABNORMAL LOW (ref 8.9–10.3)
Chloride: 113 mmol/L — ABNORMAL HIGH (ref 98–111)
Creatinine, Ser: 0.92 mg/dL (ref 0.61–1.24)
GFR, Estimated: >60 mL/min (ref >60)
Glucose, Bld: 239 mg/dL — ABNORMAL HIGH (ref 70–99)
Potassium: 2.9 mmol/L — ABNORMAL LOW (ref 3.5–5.1)
Sodium: 139 mmol/L (ref 135–145)

## 2021-10-29 LAB — TYPE AND SCREEN
ABO/RH(D): AB POS
Antibody Screen: NEGATIVE
Unit division: 0
Unit division: 0
Unit division: 0
Unit division: 0
Unit division: 0
Unit division: 0

## 2021-10-29 LAB — BPAM RBC
Blood Product Expiration Date: 202310182359
Blood Product Expiration Date: 202310212359
Blood Product Expiration Date: 202310262359
Blood Product Expiration Date: 202310272359
Blood Product Expiration Date: 202310282359
Blood Product Expiration Date: 202311022359
ISSUE DATE / TIME: 202310040834
ISSUE DATE / TIME: 202310041012
ISSUE DATE / TIME: 202310070837
ISSUE DATE / TIME: 202310071120
ISSUE DATE / TIME: 202310080149
ISSUE DATE / TIME: 202310080939
Unit Type and Rh: 5100
Unit Type and Rh: 5100
Unit Type and Rh: 6200
Unit Type and Rh: 6200
Unit Type and Rh: 8400
Unit Type and Rh: 8400

## 2021-10-29 LAB — GLUCOSE, CAPILLARY
Glucose-Capillary: 237 mg/dL — ABNORMAL HIGH (ref 70–99)
Glucose-Capillary: 214 mg/dL — ABNORMAL HIGH (ref 70–99)
Glucose-Capillary: 242 mg/dL — ABNORMAL HIGH (ref 70–99)
Glucose-Capillary: 301 mg/dL — ABNORMAL HIGH (ref 70–99)
Glucose-Capillary: 276 mg/dL — ABNORMAL HIGH (ref 70–99)
Glucose-Capillary: 266 mg/dL — ABNORMAL HIGH (ref 70–99)

## 2021-10-29 MED ORDER — DEXMEDETOMIDINE HCL IN NACL 400 MCG/100ML IV SOLN
0.4000 ug/kg/h | INTRAVENOUS | Status: DC
  Administered 2021-10-31: 0.2 ug/kg/h via INTRAVENOUS
  Administered 2021-11-02: 0.4 ug/kg/h via INTRAVENOUS
  Administered 2021-11-02: 0.5 ug/kg/h via INTRAVENOUS
  Administered 2021-11-03: 0.7 ug/kg/h via INTRAVENOUS
  Filled 2021-10-29 (×5): qty 100

## 2021-10-29 MED ORDER — FUROSEMIDE 10 MG/ML IJ SOLN: 40.0000 mg | Freq: Once | INTRAMUSCULAR | Status: DC

## 2021-10-29 MED ORDER — SENNA 8.6 MG PO TABS: 2.0000 | ORAL_TABLET | Freq: Once | ORAL | Status: DC

## 2021-10-29 MED ORDER — FUROSEMIDE 10 MG/ML IJ SOLN
40.0000 mg | Freq: Once | INTRAMUSCULAR | Status: AC
  Administered 2021-10-29: 40 mg via INTRAVENOUS
  Filled 2021-10-29: qty 4

## 2021-10-29 MED ORDER — POTASSIUM CHLORIDE 20 MEQ PO PACK
40.0000 meq | PACK | ORAL | Status: AC
  Administered 2021-10-29 – 2021-10-30 (×2): 40 meq
  Filled 2021-10-29 (×3): qty 2

## 2021-10-29 MED ORDER — MAGNESIUM CITRATE PO SOLN
0.5000 | Freq: Once | ORAL | Status: DC
  Filled 2021-10-29: qty 296

## 2021-10-29 MED ORDER — POTASSIUM CHLORIDE 10 MEQ/50ML IV SOLN
10.0000 meq | INTRAVENOUS | Status: AC
  Administered 2021-10-29 (×6): 10 meq via INTRAVENOUS
  Filled 2021-10-29 (×6): qty 50

## 2021-10-29 MED ORDER — SENNA 8.6 MG PO TABS
2.0000 | ORAL_TABLET | Freq: Once | ORAL | Status: AC
  Administered 2021-10-29: 17.2 mg
  Filled 2021-10-29: qty 2

## 2021-10-29 MED ORDER — MAGNESIUM CITRATE PO SOLN
0.5000 | Freq: Once | ORAL | Status: AC
  Administered 2021-10-29: 0.5
  Filled 2021-10-29: qty 296

## 2021-10-29 MED ORDER — MIDAZOLAM HCL 2 MG/2ML IJ SOLN
INTRAMUSCULAR | Status: AC
  Administered 2021-10-29: 2 mg
  Filled 2021-10-29: qty 2

## 2021-10-29 MED ORDER — BISACODYL 5 MG PO TBEC: 10.0000 mg | DELAYED_RELEASE_TABLET | Freq: Once | ORAL | Status: DC

## 2021-10-29 MED ORDER — ENALAPRILAT 1.25 MG/ML IV SOLN
1.2500 mg | Freq: Once | INTRAVENOUS | Status: AC
  Administered 2021-10-29: 1.25 mg via INTRAVENOUS
  Filled 2021-10-29: qty 1

## 2021-10-29 MED ORDER — INSULIN DETEMIR 100 UNIT/ML ~~LOC~~ SOLN
12.0000 [IU] | Freq: Two times a day (BID) | SUBCUTANEOUS | Status: DC
  Administered 2021-10-30 (×2): 12 [IU] via SUBCUTANEOUS
  Filled 2021-10-29 (×3): qty 0.12

## 2021-10-29 MED ORDER — POTASSIUM CHLORIDE 20 MEQ PO PACK: 40.0000 meq | PACK | ORAL | Status: DC

## 2021-10-29 MED ORDER — METOPROLOL TARTRATE 25 MG/10 ML ORAL SUSPENSION
12.5000 mg | Freq: Two times a day (BID) | ORAL | Status: DC
  Administered 2021-10-29 – 2021-10-30 (×2): 12.5 mg
  Filled 2021-10-29 (×2): qty 10

## 2021-10-29 MED ORDER — MAGNESIUM HYDROXIDE 400 MG/5ML PO SUSP
30.0000 mL | Freq: Once | ORAL | Status: AC
  Administered 2021-10-29: 30 mL
  Filled 2021-10-29: qty 30

## 2021-10-29 MED ORDER — OXYCODONE HCL 5 MG/5ML PO SOLN
5.0000 mg | ORAL | Status: DC | PRN
  Administered 2021-10-29 – 2021-11-01 (×5): 10 mg
  Administered 2021-11-01: 5 mg
  Administered 2021-11-02 – 2021-11-03 (×4): 10 mg
  Filled 2021-10-29 (×10): qty 10

## 2021-10-29 MED ORDER — INSULIN DETEMIR 100 UNIT/ML ~~LOC~~ SOLN
4.0000 [IU] | Freq: Once | SUBCUTANEOUS | Status: AC
  Administered 2021-10-29: 4 [IU] via SUBCUTANEOUS
  Filled 2021-10-29: qty 0.04

## 2021-10-29 MED ORDER — ROCURONIUM BROMIDE 10 MG/ML (PF) SYRINGE
PREFILLED_SYRINGE | INTRAVENOUS | Status: AC
  Administered 2021-10-29: 50 mg
  Filled 2021-10-29: qty 10

## 2021-10-29 MED ORDER — ENALAPRILAT 1.25 MG/ML IV SOLN
2.5000 mg | INTRAVENOUS | Status: AC
  Administered 2021-10-29: 2.5 mg via INTRAVENOUS
  Filled 2021-10-29: qty 2

## 2021-10-29 MED ORDER — INSULIN ASPART 100 UNIT/ML IJ SOLN
0.0000 [IU] | INTRAMUSCULAR | Status: DC
  Administered 2021-10-29: 7 [IU] via SUBCUTANEOUS
  Administered 2021-10-29: 15 [IU] via SUBCUTANEOUS
  Administered 2021-10-29: 11 [IU] via SUBCUTANEOUS
  Administered 2021-10-30: 3 [IU] via SUBCUTANEOUS
  Administered 2021-10-30: 11 [IU] via SUBCUTANEOUS
  Administered 2021-10-30 – 2021-10-31 (×5): 4 [IU] via SUBCUTANEOUS
  Administered 2021-10-31: 11 [IU] via SUBCUTANEOUS
  Administered 2021-10-31: 7 [IU] via SUBCUTANEOUS
  Administered 2021-10-31: 15 [IU] via SUBCUTANEOUS
  Administered 2021-11-01 (×2): 4 [IU] via SUBCUTANEOUS
  Administered 2021-11-01: 15 [IU] via SUBCUTANEOUS
  Administered 2021-11-02: 11 [IU] via SUBCUTANEOUS
  Administered 2021-11-02: 3 [IU] via SUBCUTANEOUS
  Administered 2021-11-02: 7 [IU] via SUBCUTANEOUS
  Administered 2021-11-02: 4 [IU] via SUBCUTANEOUS
  Administered 2021-11-02: 3 [IU] via SUBCUTANEOUS
  Administered 2021-11-02: 4 [IU] via SUBCUTANEOUS
  Administered 2021-11-03 (×2): 3 [IU] via SUBCUTANEOUS
  Administered 2021-11-03: 7 [IU] via SUBCUTANEOUS

## 2021-10-29 MED ORDER — MAGNESIUM HYDROXIDE 400 MG/5ML PO SUSP: 30.0000 mL | Freq: Once | ORAL | Status: DC

## 2021-10-29 MED ORDER — IPRATROPIUM-ALBUTEROL 0.5-2.5 (3) MG/3ML IN SOLN: 3.0000 mL | Freq: Four times a day (QID) | RESPIRATORY_TRACT | Status: DC | PRN

## 2021-10-29 MED ORDER — FENTANYL CITRATE PF 50 MCG/ML IJ SOSY
PREFILLED_SYRINGE | INTRAMUSCULAR | Status: AC
  Administered 2021-10-29: 50 ug
  Filled 2021-10-29: qty 2

## 2021-10-29 MED ORDER — NICARDIPINE HCL IN NACL 20-0.86 MG/200ML-% IV SOLN: 3.0000 mg/h | INTRAVENOUS | Status: DC

## 2021-10-29 NOTE — Progress Notes (Signed)
Dr. Bobbye Morton paged for BP on art line sustained > 200 mmHg, unchanged after metoprolol IV administration. Order given for enlalapril 1.25 IV now. MD informed of RR 25 (changed from 16 earlier) and SpO2 94% with concern for airway management overnight. MD will come to assess patient.

## 2021-10-29 NOTE — Care Plan (Signed)
Stat Head CT ordered, RN to let us know when they are able to bring pt. Down to CT.

## 2021-10-29 NOTE — Progress Notes (Addendum)
Trauma/Critical Care Follow Up Note  Subjective:    Overnight Issues:   Objective:  Vital signs for last 24 hours: Temp:  [98.1 F (36.7 C)-99.5 F (37.5 C)] 99.5 F (37.5 C) (10/08 0745) Pulse Rate:  [74-127] 119 (10/08 0745) Resp:  [11-18] 14 (10/08 0745) BP: (91-153)/(48-83) 124/56 (10/08 0745) SpO2:  [97 %-100 %] 99 % (10/08 0745) Arterial Line BP: (108-224)/(20-203) 137/60 (10/08 0745) FiO2 (%):  [40 %] 40 % (10/08 0734)  Hemodynamic parameters for last 24 hours:    Intake/Output from previous day: 10/07 0701 - 10/08 0700 In: 5108.8 [I.V.:2197.6; Blood:945; VZ/DG:3875; IV Piggyback:700.2] Out: 2330 [Urine:1730; Drains:600]  Intake/Output this shift: Total I/O In: -  Out: 300 [Urine:300]  Vent settings for last 24 hours: Vent Mode: PRVC FiO2 (%):  [40 %] 40 % Set Rate:  [12 bmp] 12 bmp Vt Set:  [520 mL] 520 mL PEEP:  [5 cmH20] 5 cmH20 Plateau Pressure:  [14 cmH20-16 cmH20] 14 cmH20  Physical Exam:  Gen: comfortable, no distress Neuro: f/c HEENT: PERRL Neck: supple CV: RRR Pulm: unlabored breathing Abd: soft, NT GU: clear yellow urine Extr: wwp, no edema   Results for orders placed or performed during the hospital encounter of 10/26/2021 (from the past 24 hour(s))  I-STAT 7, (LYTES, BLD GAS, ICA, H+H)     Status: Abnormal   Collection Time: 10/28/21 10:47 AM  Result Value Ref Range   pH, Arterial 7.346 (L) 7.35 - 7.45   pCO2 arterial 33.7 32 - 48 mmHg   pO2, Arterial 98 83 - 108 mmHg   Bicarbonate 18.4 (L) 20.0 - 28.0 mmol/L   TCO2 19 (L) 22 - 32 mmol/L   O2 Saturation 97 %   Acid-base deficit 7.0 (H) 0.0 - 2.0 mmol/L   Sodium 139 135 - 145 mmol/L   Potassium 3.8 3.5 - 5.1 mmol/L   Calcium, Ion 1.10 (L) 1.15 - 1.40 mmol/L   HCT 21.0 (L) 39.0 - 52.0 %   Hemoglobin 7.1 (L) 13.0 - 17.0 g/dL   Patient temperature 36.9 C    Collection site art line    Drawn by RT    Sample type ARTERIAL   Glucose, capillary     Status: Abnormal   Collection  Time: 10/28/21 11:53 AM  Result Value Ref Range   Glucose-Capillary 127 (H) 70 - 99 mg/dL  Glucose, capillary     Status: Abnormal   Collection Time: 10/28/21  3:17 PM  Result Value Ref Range   Glucose-Capillary 144 (H) 70 - 99 mg/dL  Basic metabolic panel     Status: Abnormal   Collection Time: 10/28/21  3:54 PM  Result Value Ref Range   Sodium 137 135 - 145 mmol/L   Potassium 3.2 (L) 3.5 - 5.1 mmol/L   Chloride 111 98 - 111 mmol/L   CO2 19 (L) 22 - 32 mmol/L   Glucose, Bld 158 (H) 70 - 99 mg/dL   BUN 15 8 - 23 mg/dL   Creatinine, Ser 0.96 0.61 - 1.24 mg/dL   Calcium 7.5 (L) 8.9 - 10.3 mg/dL   GFR, Estimated >60 >60 mL/min   Anion gap 7 5 - 15  Hemoglobin and hematocrit, blood     Status: Abnormal   Collection Time: 10/28/21  3:54 PM  Result Value Ref Range   Hemoglobin 9.0 (L) 13.0 - 17.0 g/dL   HCT 27.1 (L) 39.0 - 52.0 %  Glucose, capillary     Status: Abnormal   Collection Time: 10/28/21  8:04 PM  Result Value Ref Range   Glucose-Capillary 170 (H) 70 - 99 mg/dL  Glucose, capillary     Status: Abnormal   Collection Time: 10/28/21 11:54 PM  Result Value Ref Range   Glucose-Capillary 203 (H) 70 - 99 mg/dL   Comment 1 Notify RN    Comment 2 Document in Chart   Glucose, capillary     Status: Abnormal   Collection Time: 10/29/21  5:09 AM  Result Value Ref Range   Glucose-Capillary 237 (H) 70 - 99 mg/dL   Comment 1 Notify RN    Comment 2 Document in Chart   CBC with Differential/Platelet     Status: Abnormal   Collection Time: 10/29/21  5:27 AM  Result Value Ref Range   WBC 10.1 4.0 - 10.5 K/uL   RBC 2.98 (L) 4.22 - 5.81 MIL/uL   Hemoglobin 8.9 (L) 13.0 - 17.0 g/dL   HCT 26.0 (L) 39.0 - 52.0 %   MCV 87.2 80.0 - 100.0 fL   MCH 29.9 26.0 - 34.0 pg   MCHC 34.2 30.0 - 36.0 g/dL   RDW 14.9 11.5 - 15.5 %   Platelets 88 (L) 150 - 400 K/uL   nRBC 1.4 (H) 0.0 - 0.2 %   Neutrophils Relative % 76 %   Neutro Abs 7.8 (H) 1.7 - 7.7 K/uL   Lymphocytes Relative 8 %   Lymphs Abs  0.8 0.7 - 4.0 K/uL   Monocytes Relative 10 %   Monocytes Absolute 1.0 0.1 - 1.0 K/uL   Eosinophils Relative 0 %   Eosinophils Absolute 0.0 0.0 - 0.5 K/uL   Basophils Relative 0 %   Basophils Absolute 0.0 0.0 - 0.1 K/uL   WBC Morphology MORPHOLOGY UNREMARKABLE    RBC Morphology MORPHOLOGY UNREMARKABLE    Smear Review MORPHOLOGY UNREMARKABLE    Immature Granulocytes 6 %   Abs Immature Granulocytes 0.60 (H) 0.00 - 0.07 K/uL  Glucose, capillary     Status: Abnormal   Collection Time: 10/29/21  8:18 AM  Result Value Ref Range   Glucose-Capillary 214 (H) 70 - 99 mg/dL    Assessment & Plan: The plan of care was discussed with the bedside nurse for the day, Chase, who is in agreement with this plan and no additional concerns were raised.   Present on Admission:  Trauma    LOS: 4 days   Additional comments:I reviewed the patient's new clinical lab test results.   and I reviewed the patients new imaging test results.    PHBC   SAH/SDH - NSGY c/s, Dr. Ronnald Ramp. Keppra x7 days for seizure ppx, repeat head CT 10/5 stable Frontal Scalp laceration - no underlying fx. Washout and closure per EDP. Will need staple removal 10/12 B/l nasal bone fxs - ENT c/s, Dr. Marcelline Deist. Nose blowing precautions and outpatient follow up  R rib fx 3-8 - no PTX on CT B/l pubic rami fxs, R acetabular fx, R ankle fx - ortho c/s, nonop, TDWB, will see again when extubated TP fx L5, S1 - pain control Degloving of b/l lower extremities - ortho consult, Dr. Sharol Given, s/p I&D and vac placement 10/4; plan for OR 10/11 ABLA - 2u pRBC 10/7 followed by lasix, good response to both Hyperglycemia - off insulin gtt, now on SSI, escalate and increase levemir to 12BID Shock - resolved Transaminitis - limit tylenol, trend Myoglobinuria vs rhabdomyolysis - suspect this is from extensive soft tissue injury, trend creatinine, which is currently normal FEN - NPO, OGT,  TF DVT - SCDs, start LMWH H/o HTN H/o paroxsymal SVT H/o prostate  cancer Neurogenic bladder - foley in ED Macular degeneration w/ h/o retinal detachment and defect Dispo - ICU  Critical Care Total Time: 45 minutes  Jesusita Oka, MD Trauma & General Surgery Please use [email or web address] to contact on call provider  10/29/2021  *Care during the described time interval was provided by me. I have reviewed this patient's available data, including medical history, events of note, physical examination and test results as part of my evaluation.

## 2021-10-29 NOTE — Progress Notes (Signed)
Pt. Was transported via vent to with no incident

## 2021-10-29 NOTE — Procedures (Signed)
Intubation Procedure Note  GENERAL WEARING  [id number]  [birth year]  Date:10/29/21  Time:8:04 PM   Provider Performing: Jesusita Oka    Procedure: Intubation (31500)  Indication(s) Respiratory Failure  Consent Risks of the procedure as well as the alternatives and risks of each were explained to the patient and/or caregiver.  Consent for the procedure was obtained and is signed in the bedside chart   Anesthesia Versed, Fentanyl, and Rocuronium   Time Out Verified patient identification, verified procedure, site/side was marked, verified correct patient position, special equipment/implants available, medications/allergies/relevant history reviewed, required imaging and test results available.   Sterile Technique Usual hand hygeine, masks, and gloves were used   Procedure Description Patient positioned in bed supine.  Sedation given as noted above.  Patient was intubated with endotracheal tube using Glidescope.  View was Grade 1 full glottis .  Number of attempts was 1.  Colorimetric CO2 detector was consistent with tracheal placement.   Complications/Tolerance None; patient tolerated the procedure well. Chest X-ray is ordered to verify placement.   EBL none   Specimen(s) None

## 2021-10-29 NOTE — Procedures (Signed)
Extubation Procedure Note  Patient Details:   Name: Micheal [name] DOB: Mar 31, 1928 MRN: [id number]   Airway Documentation:    Vent end date: 10/29/21 Vent end time: 1320   Evaluation  O2 sats: stable throughout Complications: No apparent complications Patient did tolerate procedure well. Bilateral Breath Sounds: Clear, Diminished   No  Patient extubated per MD order. Positive cuff leak noted. After extubation, patient not really following commands or able to cough. Rhonchi BBS. Patient has labored breathing. Sats are 95% on 6L [place]. MD at bedside. Orders given for ABG in 30 minutes. RN at bedside.  Jayma Volpi H Hervey Wedig 10/29/2021, 1:46 PM

## 2021-10-29 NOTE — Progress Notes (Addendum)
Continued respiratory decline. ABG with hypocarbia, impending respiratory failure. D/w wife, who desires intubation. See separate procedure note. Persistent hypertension refractory to multiple doses of metoprolol and 1.25 enalapril. Will dose 2.5 of enalapril and if still refractory, start cardene gtt. Will also get head CT given mentation.  Critical care time: 30 min

## 2021-10-30 ENCOUNTER — Inpatient Hospital Stay (HOSPITAL_COMMUNITY): Payer: Medicare Other

## 2021-10-30 LAB — CBC WITH DIFFERENTIAL/PLATELET
Abs Immature Granulocytes: 0 10*3/uL (ref 0.00–0.07)
Basophils Absolute: 0 10*3/uL (ref 0.0–0.1)
Basophils Relative: 0 %
Eosinophils Absolute: 0 10*3/uL (ref 0.0–0.5)
Eosinophils Relative: 0 %
HCT: 31.3 % — ABNORMAL LOW (ref 39.0–52.0)
Hemoglobin: 11.1 g/dL — ABNORMAL LOW (ref 13.0–17.0)
Lymphocytes Relative: 6 %
Lymphs Abs: 1 10*3/uL (ref 0.7–4.0)
MCH: 30.6 pg (ref 26.0–34.0)
MCHC: 35.5 g/dL (ref 30.0–36.0)
MCV: 86.2 fL (ref 80.0–100.0)
Monocytes Absolute: 0.8 10*3/uL (ref 0.1–1.0)
Monocytes Relative: 5 %
Neutro Abs: 14.9 10*3/uL — ABNORMAL HIGH (ref 1.7–7.7)
Neutrophils Relative %: 89 %
Platelets: 114 10*3/uL — ABNORMAL LOW (ref 150–400)
RBC: 3.63 MIL/uL — ABNORMAL LOW (ref 4.22–5.81)
RDW: 14.9 % (ref 11.5–15.5)
WBC: 16.7 10*3/uL — ABNORMAL HIGH (ref 4.0–10.5)
nRBC: 2 /100 WBC — ABNORMAL HIGH
nRBC: 2.6 % — ABNORMAL HIGH (ref 0.0–0.2)

## 2021-10-30 LAB — GLUCOSE, CAPILLARY
Glucose-Capillary: 113 mg/dL — ABNORMAL HIGH (ref 70–99)
Glucose-Capillary: 152 mg/dL — ABNORMAL HIGH (ref 70–99)
Glucose-Capillary: 153 mg/dL — ABNORMAL HIGH (ref 70–99)
Glucose-Capillary: 157 mg/dL — ABNORMAL HIGH (ref 70–99)
Glucose-Capillary: 183 mg/dL — ABNORMAL HIGH (ref 70–99)
Glucose-Capillary: 97 mg/dL (ref 70–99)

## 2021-10-30 LAB — BASIC METABOLIC PANEL
Anion gap: 8 (ref 5–15)
BUN: 15 mg/dL (ref 8–23)
CO2: 27 mmol/L (ref 22–32)
Calcium: 8 mg/dL — ABNORMAL LOW (ref 8.9–10.3)
Chloride: 112 mmol/L — ABNORMAL HIGH (ref 98–111)
Creatinine, Ser: 0.77 mg/dL (ref 0.61–1.24)
GFR, Estimated: >60 mL/min (ref >60)
Glucose, Bld: 126 mg/dL — ABNORMAL HIGH (ref 70–99)
Potassium: 3.3 mmol/L — ABNORMAL LOW (ref 3.5–5.1)
Sodium: 147 mmol/L — ABNORMAL HIGH (ref 135–145)

## 2021-10-30 LAB — HEPARIN INDUCED PLATELET AB (HIT ANTIBODY): Heparin Induced Plt Ab: 0.017 OD (ref 0.000–0.400)

## 2021-10-30 MED ORDER — NICARDIPINE HCL IN NACL 20-0.86 MG/200ML-% IV SOLN
3.0000 mg/h | INTRAVENOUS | Status: DC
  Administered 2021-10-30: 2.5 mg/h via INTRAVENOUS
  Administered 2021-10-30: 5 mg/h via INTRAVENOUS
  Administered 2021-10-30: 3 mg/h via INTRAVENOUS
  Administered 2021-10-31: 5 mg/h via INTRAVENOUS
  Administered 2021-10-31: 3 mg/h via INTRAVENOUS
  Filled 2021-10-30 (×5): qty 200

## 2021-10-30 MED ORDER — PROSOURCE TF20 ENFIT COMPATIBL EN LIQD
60.0000 mL | Freq: Two times a day (BID) | ENTERAL | Status: DC
  Administered 2021-10-30 – 2021-11-03 (×8): 60 mL
  Filled 2021-10-30 (×8): qty 60

## 2021-10-30 MED ORDER — NOREPINEPHRINE 16 MG/250ML-% IV SOLN
0.0000 ug/min | INTRAVENOUS | Status: DC
  Administered 2021-10-30 – 2021-11-02 (×2): 2 ug/min via INTRAVENOUS
  Filled 2021-10-30: qty 250

## 2021-10-30 MED ORDER — INSULIN DETEMIR 100 UNIT/ML ~~LOC~~ SOLN
12.0000 [IU] | Freq: Two times a day (BID) | SUBCUTANEOUS | Status: DC
  Administered 2021-10-30 – 2021-11-03 (×8): 12 [IU] via SUBCUTANEOUS
  Filled 2021-10-30 (×10): qty 0.12

## 2021-10-30 MED ORDER — POTASSIUM CHLORIDE 20 MEQ PO PACK
40.0000 meq | PACK | ORAL | Status: AC
  Administered 2021-10-30 (×2): 40 meq
  Filled 2021-10-30 (×2): qty 2

## 2021-10-30 MED ORDER — NOREPINEPHRINE 4 MG/250ML-% IV SOLN: 0.0000 ug/min | INTRAVENOUS | Status: DC

## 2021-10-30 NOTE — Procedures (Signed)
Cortrak  Person Inserting Tube:  Micheal [name] D, RD Tube Type:  Cortrak - 43 inches Tube Size:  10 Tube Location:  Left nare Secured by: Bridle Technique Used to Measure Tube Placement:  Marking at nare/corner of mouth Cortrak Secured At:  73 cm  Cortrak Tube Team Note:  Consult received to place a Cortrak feeding tube.   X-ray is required, abdominal x-ray has been ordered by the Cortrak team. Please confirm tube placement before using the Cortrak tube.   If the tube becomes dislodged please keep the tube and contact the Cortrak team at [email or web address] for replacement.  If after hours and replacement cannot be delayed, place a NG tube and confirm placement with an abdominal x-ray.    Micheal [name], RD, LDN Clinical Dietitian RD pager # available in [place]  After hours/weekend pager # available in [facility]
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
## 2021-10-30 NOTE — Progress Notes (Signed)
Trauma/Critical Care Follow Up Note  Subjective:    Overnight Issues:   Objective:  Vital signs for last 24 hours: Temp:  [97.3 F (36.3 C)-99.5 F (37.5 C)] 98.9 F (37.2 C) (10/09 1559) Pulse Rate:  [106-132] 106 (10/09 1900) Resp:  [11-23] 16 (10/09 1900) BP: (95-187)/(46-87) 102/49 (10/09 1900) SpO2:  [90 %-100 %] 97 % (10/09 1900) Arterial Line BP: (37-226)/(34-96) 116/50 (10/09 1900) FiO2 (%):  [40 %-65 %] 40 % (10/09 1929)  Hemodynamic parameters for last 24 hours:    Intake/Output from previous day: 10/08 0701 - 10/09 0700 In: 1527.3 [I.V.:322.5; NG/GT:300; IV Piggyback:904.8] Out: 5690 [Urine:5050; Drains:640]  Intake/Output this shift: No intake/output data recorded.  Vent settings for last 24 hours: Vent Mode: PRVC FiO2 (%):  [40 %-65 %] 40 % Set Rate:  [12 bmp] 12 bmp Vt Set:  [520 mL] 520 mL PEEP:  [5 cmH20] 5 cmH20 Pressure Support:  [5 cmH20] 5 cmH20 Plateau Pressure:  [13 cmH20-15 cmH20] 15 cmH20  Physical Exam:  Gen: comfortable, no distress Neuro: non-focal exam HEENT: PERRL Neck: supple CV: RRR Pulm: unlabored breathing Abd: soft, NT GU: clear yellow urine Extr: wwp, no edema   Results for orders placed or performed during the hospital encounter of 11/15/2021 (from the past 24 hour(s))  I-STAT 7, (LYTES, BLD GAS, ICA, H+H)     Status: Abnormal   Collection Time: 10/29/21  9:10 PM  Result Value Ref Range   pH, Arterial 7.296 (L) 7.35 - 7.45   pCO2 arterial 53.7 (H) 32 - 48 mmHg   pO2, Arterial 239 (H) 83 - 108 mmHg   Bicarbonate 25.9 20.0 - 28.0 mmol/L   TCO2 27 22 - 32 mmol/L   O2 Saturation 100 %   Acid-base deficit 1.0 0.0 - 2.0 mmol/L   Sodium 145 135 - 145 mmol/L   Potassium 3.4 (L) 3.5 - 5.1 mmol/L   Calcium, Ion 1.12 (L) 1.15 - 1.40 mmol/L   HCT 35.0 (L) 39.0 - 52.0 %   Hemoglobin 11.9 (L) 13.0 - 17.0 g/dL   Patient temperature 100.0 F    Sample type ARTERIAL   Glucose, capillary     Status: Abnormal   Collection Time:  10/29/21 11:28 PM  Result Value Ref Range   Glucose-Capillary 266 (H) 70 - 99 mg/dL  Glucose, capillary     Status: Abnormal   Collection Time: 10/30/21  3:34 AM  Result Value Ref Range   Glucose-Capillary 153 (H) 70 - 99 mg/dL  CBC with Differential/Platelet     Status: Abnormal   Collection Time: 10/30/21  6:19 AM  Result Value Ref Range   WBC 16.7 (H) 4.0 - 10.5 K/uL   RBC 3.63 (L) 4.22 - 5.81 MIL/uL   Hemoglobin 11.1 (L) 13.0 - 17.0 g/dL   HCT 31.3 (L) 39.0 - 52.0 %   MCV 86.2 80.0 - 100.0 fL   MCH 30.6 26.0 - 34.0 pg   MCHC 35.5 30.0 - 36.0 g/dL   RDW 14.9 11.5 - 15.5 %   Platelets 114 (L) 150 - 400 K/uL   nRBC 2.6 (H) 0.0 - 0.2 %   Neutrophils Relative % 89 %   Neutro Abs 14.9 (H) 1.7 - 7.7 K/uL   Lymphocytes Relative 6 %   Lymphs Abs 1.0 0.7 - 4.0 K/uL   Monocytes Relative 5 %   Monocytes Absolute 0.8 0.1 - 1.0 K/uL   Eosinophils Relative 0 %   Eosinophils Absolute 0.0 0.0 - 0.5 K/uL  Basophils Relative 0 %   Basophils Absolute 0.0 0.0 - 0.1 K/uL   WBC Morphology See Note    nRBC 2 (H) 0 /100 WBC   Abs Immature Granulocytes 0.00 0.00 - 0.07 K/uL   Polychromasia PRESENT   Basic metabolic panel     Status: Abnormal   Collection Time: 10/30/21  6:19 AM  Result Value Ref Range   Sodium 147 (H) 135 - 145 mmol/L   Potassium 3.3 (L) 3.5 - 5.1 mmol/L   Chloride 112 (H) 98 - 111 mmol/L   CO2 27 22 - 32 mmol/L   Glucose, Bld 126 (H) 70 - 99 mg/dL   BUN 15 8 - 23 mg/dL   Creatinine, Ser 0.77 0.61 - 1.24 mg/dL   Calcium 8.0 (L) 8.9 - 10.3 mg/dL   GFR, Estimated >60 >60 mL/min   Anion gap 8 5 - 15  Glucose, capillary     Status: Abnormal   Collection Time: 10/30/21  7:48 AM  Result Value Ref Range   Glucose-Capillary 113 (H) 70 - 99 mg/dL  Glucose, capillary     Status: None   Collection Time: 10/30/21 11:26 AM  Result Value Ref Range   Glucose-Capillary 97 70 - 99 mg/dL  Glucose, capillary     Status: Abnormal   Collection Time: 10/30/21  3:31 PM  Result Value Ref  Range   Glucose-Capillary 152 (H) 70 - 99 mg/dL  Glucose, capillary     Status: Abnormal   Collection Time: 10/30/21  7:53 PM  Result Value Ref Range   Glucose-Capillary 183 (H) 70 - 99 mg/dL    Assessment & Plan: The plan of care was discussed with the bedside nurse for the day, who is in agreement with this plan and no additional concerns were raised.   Present on Admission:  Trauma    LOS: 5 days   Additional comments:I reviewed the patient's new clinical lab test results.   and I reviewed the patients new imaging test results.    PHBC   SAH/SDH - NSGY c/s, Dr. Ronnald Ramp. Keppra x7 days for seizure ppx, repeat head CT 10/5 stable Frontal Scalp laceration - no underlying fx. Washout and closure per EDP. Will need staple removal 10/12 B/l nasal bone fxs - ENT c/s, Dr. Marcelline Deist. Nose blowing precautions and outpatient follow up  R rib fx 3-8 - no PTX on CT B/l pubic rami fxs, R acetabular fx, R ankle fx - ortho c/s, nonop, TDWB, will see again when extubated TP fx L5, S1 - pain control Degloving of b/l lower extremities - ortho consult, Dr. Sharol Given, s/p I&D and vac placement 10/4; plan for OR 10/11 ABLA - 2u pRBC 10/7 followed by lasix, good response to both Hyperglycemia - off insulin gtt, now on SSI, escalate and increase levemir to 12BID Shock - resolved, but hemodynamically labile, has not required pressors, but does vacillate between borderline hypotension and hypertensive urgency. Cardene available.  Transaminitis - limit tylenol, trend VDRF - reintubated after ~6h yesterday, d/w family today and will keep intubated until after surgery 10/11 and optimize for one way extubation Myoglobinuria vs rhabdomyolysis - suspect this is from extensive soft tissue injury, trend creatinine, which is currently normal FEN - NPO, OGT, TF DVT - SCDs, start LMWH H/o HTN H/o paroxsymal SVT H/o prostate cancer Neurogenic bladder - foley in ED Macular degeneration w/ h/o retinal detachment and  defect Dispo - ICU  Critical Care Total Time: 54 minutes  Jesusita Oka, MD Trauma &  General Surgery Please use [email or web address] to contact on call provider  10/30/2021  *Care during the described time interval was provided by me. I have reviewed this patient's available data, including medical history, events of note, physical examination and test results as part of my evaluation.

## 2021-10-31 ENCOUNTER — Encounter (HOSPITAL_COMMUNITY): Payer: Self-pay | Admitting: Anesthesiology

## 2021-10-31 LAB — BASIC METABOLIC PANEL
Anion gap: 8 (ref 5–15)
BUN: 24 mg/dL — ABNORMAL HIGH (ref 8–23)
CO2: 23 mmol/L (ref 22–32)
Calcium: 7.5 mg/dL — ABNORMAL LOW (ref 8.9–10.3)
Chloride: 112 mmol/L — ABNORMAL HIGH (ref 98–111)
Creatinine, Ser: 0.75 mg/dL (ref 0.61–1.24)
GFR, Estimated: >60 mL/min (ref >60)
Glucose, Bld: 195 mg/dL — ABNORMAL HIGH (ref 70–99)
Potassium: 3.2 mmol/L — ABNORMAL LOW (ref 3.5–5.1)
Sodium: 143 mmol/L (ref 135–145)

## 2021-10-31 LAB — CBC WITH DIFFERENTIAL/PLATELET
Abs Immature Granulocytes: 2.77 10*3/uL — ABNORMAL HIGH (ref 0.00–0.07)
Basophils Absolute: 0 10*3/uL (ref 0.0–0.1)
Basophils Relative: 0 %
Eosinophils Absolute: 0.2 10*3/uL (ref 0.0–0.5)
Eosinophils Relative: 1 %
HCT: 29.2 % — ABNORMAL LOW (ref 39.0–52.0)
Hemoglobin: 10 g/dL — ABNORMAL LOW (ref 13.0–17.0)
Immature Granulocytes: 14 %
Lymphocytes Relative: 8 %
Lymphs Abs: 1.5 10*3/uL (ref 0.7–4.0)
MCH: 30.5 pg (ref 26.0–34.0)
MCHC: 34.2 g/dL (ref 30.0–36.0)
MCV: 89 fL (ref 80.0–100.0)
Monocytes Absolute: 2.3 10*3/uL — ABNORMAL HIGH (ref 0.1–1.0)
Monocytes Relative: 12 %
Neutro Abs: 12.5 10*3/uL — ABNORMAL HIGH (ref 1.7–7.7)
Neutrophils Relative %: 65 %
Platelets: 114 10*3/uL — ABNORMAL LOW (ref 150–400)
RBC: 3.28 MIL/uL — ABNORMAL LOW (ref 4.22–5.81)
RDW: 15.4 % (ref 11.5–15.5)
WBC: 19.3 10*3/uL — ABNORMAL HIGH (ref 4.0–10.5)
nRBC: 1.6 % — ABNORMAL HIGH (ref 0.0–0.2)

## 2021-10-31 LAB — GLUCOSE, CAPILLARY
Glucose-Capillary: 159 mg/dL — ABNORMAL HIGH (ref 70–99)
Glucose-Capillary: 159 mg/dL — ABNORMAL HIGH (ref 70–99)
Glucose-Capillary: 228 mg/dL — ABNORMAL HIGH (ref 70–99)
Glucose-Capillary: 271 mg/dL — ABNORMAL HIGH (ref 70–99)
Glucose-Capillary: 303 mg/dL — ABNORMAL HIGH (ref 70–99)

## 2021-10-31 LAB — MAGNESIUM: Magnesium: 2.5 mg/dL — ABNORMAL HIGH (ref 1.7–2.4)

## 2021-10-31 MED ORDER — METOPROLOL TARTRATE 5 MG/5ML IV SOLN
5.0000 mg | Freq: Once | INTRAVENOUS | Status: AC
  Administered 2021-10-31: 5 mg via INTRAVENOUS
  Filled 2021-10-31: qty 5

## 2021-10-31 MED ORDER — POTASSIUM CHLORIDE 20 MEQ PO PACK
40.0000 meq | PACK | ORAL | Status: AC
  Administered 2021-10-31 (×2): 40 meq
  Filled 2021-10-31 (×2): qty 2

## 2021-10-31 MED ORDER — HYDROCORTISONE SOD SUC (PF) 100 MG IJ SOLR
50.0000 mg | Freq: Every day | INTRAMUSCULAR | Status: DC
  Administered 2021-11-01: 50 mg via INTRAVENOUS
  Filled 2021-10-31: qty 2

## 2021-10-31 NOTE — Progress Notes (Signed)
Patient ID: Micheal [name], male   DOB: 06/24/1928, 88 y.o.   MRN: [id number] Wound vacs functioning well.  Plan for return to the operating room tomorrow for application of Kerecis tissue graft to all 4 extremities.

## 2021-10-31 NOTE — Progress Notes (Signed)
Trauma/Critical Care Follow Up Note  Subjective:    Overnight Issues:   Objective:  Vital signs for last 24 hours: Temp:  [98.2 F (36.8 C)-100.4 F (38 C)] 100.4 F (38 C) (10/10 1200) Pulse Rate:  [103-132] 115 (10/10 1315) Resp:  [12-30] 20 (10/10 1315) BP: (83-171)/(39-73) 147/58 (10/10 1300) SpO2:  [90 %-98 %] 95 % (10/10 1315) Arterial Line BP: (91-200)/(42-88) 145/58 (10/10 1315) FiO2 (%):  [40 %] 40 % (10/10 0803)  Hemodynamic parameters for last 24 hours:    Intake/Output from previous day: 10/09 0701 - 10/10 0700 In: 1989.9 [I.V.:910; NG/GT:480; IV Piggyback:600] Out: 1600 [Urine:1150; Drains:450]  Intake/Output this shift: Total I/O In: 262.3 [I.V.:62.5; IV Piggyback:199.9] Out: 625 [Urine:625]  Vent settings for last 24 hours: Vent Mode: PSV;CPAP FiO2 (%):  [40 %] 40 % Set Rate:  [12 bmp] 12 bmp Vt Set:  [520 mL] 520 mL PEEP:  [5 cmH20] 5 cmH20 Pressure Support:  [5 cmH20] 5 cmH20 Plateau Pressure:  [14 cmH20-15 cmH20] 14 cmH20  Physical Exam:  Gen: comfortable, no distress Neuro: non-focal exam, f/c HEENT: PERRL Neck: supple CV: RRR Pulm: unlabored breathing Abd: soft, NT GU: clear yellow urine Extr: wwp, no edema   Results for orders placed or performed during the hospital encounter of 10/28/2021 (from the past 24 hour(s))  Glucose, capillary     Status: Abnormal   Collection Time: 10/30/21  3:31 PM  Result Value Ref Range   Glucose-Capillary 152 (H) 70 - 99 mg/dL  Glucose, capillary     Status: Abnormal   Collection Time: 10/30/21  7:53 PM  Result Value Ref Range   Glucose-Capillary 183 (H) 70 - 99 mg/dL  Glucose, capillary     Status: Abnormal   Collection Time: 10/30/21 11:40 PM  Result Value Ref Range   Glucose-Capillary 157 (H) 70 - 99 mg/dL  Glucose, capillary     Status: Abnormal   Collection Time: 10/31/21  3:45 AM  Result Value Ref Range   Glucose-Capillary 159 (H) 70 - 99 mg/dL  CBC with Differential/Platelet     Status:  Abnormal   Collection Time: 10/31/21  5:00 AM  Result Value Ref Range   WBC 19.3 (H) 4.0 - 10.5 K/uL   RBC 3.28 (L) 4.22 - 5.81 MIL/uL   Hemoglobin 10.0 (L) 13.0 - 17.0 g/dL   HCT 29.2 (L) 39.0 - 52.0 %   MCV 89.0 80.0 - 100.0 fL   MCH 30.5 26.0 - 34.0 pg   MCHC 34.2 30.0 - 36.0 g/dL   RDW 15.4 11.5 - 15.5 %   Platelets 114 (L) 150 - 400 K/uL   nRBC 1.6 (H) 0.0 - 0.2 %   Neutrophils Relative % 65 %   Neutro Abs 12.5 (H) 1.7 - 7.7 K/uL   Lymphocytes Relative 8 %   Lymphs Abs 1.5 0.7 - 4.0 K/uL   Monocytes Relative 12 %   Monocytes Absolute 2.3 (H) 0.1 - 1.0 K/uL   Eosinophils Relative 1 %   Eosinophils Absolute 0.2 0.0 - 0.5 K/uL   Basophils Relative 0 %   Basophils Absolute 0.0 0.0 - 0.1 K/uL   WBC Morphology      MODERATE LEFT SHIFT (>5% METAS AND MYELOS,OCC PRO NOTED)   Smear Review MORPHOLOGY UNREMARKABLE    Immature Granulocytes 14 %   Abs Immature Granulocytes 2.77 (H) 0.00 - 0.07 K/uL  Basic metabolic panel     Status: Abnormal   Collection Time: 10/31/21  5:00 AM  Result  Value Ref Range   Sodium 143 135 - 145 mmol/L   Potassium 3.2 (L) 3.5 - 5.1 mmol/L   Chloride 112 (H) 98 - 111 mmol/L   CO2 23 22 - 32 mmol/L   Glucose, Bld 195 (H) 70 - 99 mg/dL   BUN 24 (H) 8 - 23 mg/dL   Creatinine, Ser 0.75 0.61 - 1.24 mg/dL   Calcium 7.5 (L) 8.9 - 10.3 mg/dL   GFR, Estimated >60 >60 mL/min   Anion gap 8 5 - 15  Magnesium     Status: Abnormal   Collection Time: 10/31/21  5:00 AM  Result Value Ref Range   Magnesium 2.5 (H) 1.7 - 2.4 mg/dL  Glucose, capillary     Status: Abnormal   Collection Time: 10/31/21  8:01 AM  Result Value Ref Range   Glucose-Capillary 159 (H) 70 - 99 mg/dL  Glucose, capillary     Status: Abnormal   Collection Time: 10/31/21 12:35 PM  Result Value Ref Range   Glucose-Capillary 228 (H) 70 - 99 mg/dL    Assessment & Plan: The plan of care was discussed with the bedside nurse for the day, who is in agreement with this plan and no additional  concerns were raised.   Present on Admission:  Trauma    LOS: 6 days   Additional comments:I reviewed the patient's new clinical lab test results.   and I reviewed the patients new imaging test results.    PHBC   SAH/SDH - NSGY c/s, Dr. Ronnald Ramp. Keppra x7 days for seizure ppx, repeat head CT 10/5 stable Frontal Scalp laceration - no underlying fx. Washout and closure per EDP. Will need staple removal 10/12 B/l nasal bone fxs - ENT c/s, Dr. Marcelline Deist. Nose blowing precautions and outpatient follow up  R rib fx 3-8 - no PTX on CT B/l pubic rami fxs, R acetabular fx, R ankle fx - ortho c/s, nonop, TDWB, will see again when extubated TP fx L5, S1 - pain control Degloving of b/l lower extremities - ortho consult, Dr. Sharol Given, s/p I&D and vac placement 10/4; plan for OR 10/11 ABLA - 2u pRBC 10/7 followed by lasix, good response to both Hyperglycemia - off insulin gtt, now on SSI, levemir 12BID, may be able to de-escalate as steroids weaned Shock - resolved, but hemodynamically labile, has not required pressors, but does vacillate between borderline hypotension and hypertensive urgency. Cardene available.  Transaminitis - limit tylenol, trend VDRF - reintubated after ~6h yesterday, d/w family today and will keep intubated until after surgery 10/11 and optimize for one way extubation Myoglobinuria vs rhabdomyolysis - suspect this is from extensive soft tissue injury, trend creatinine, which is currently normal FEN - NPO, OGT, TF DVT - SCDs, start LMWH H/o HTN H/o paroxsymal SVT H/o prostate cancer Neurogenic bladder - foley in ED Macular degeneration w/ h/o retinal detachment and defect Dispo - ICU   Lengthy discussion with patient's wife regarding code status at her request. She is requesting a transition to full DNR status with the exception of vasopressors. She is declining re-intubation, antiarrhythmics, defibrillation, chest compressions. We discussed that DNR status is rescinded  intra-/peri-op as a standard and she is requesting that DNR status (with the exception of vasopressors) be maintained intra-/peri-op. Operating surgeon and anesthesia notified of this decision   Critical Care Total Time: 80 minutes  Jesusita Oka, MD Trauma & General Surgery Please use [email or web address] to contact on call provider  10/31/2021  *Care during the described time interval was  provided by me. I have reviewed this patient's available data, including medical history, events of note, physical examination and test results as part of my evaluation.

## 2021-10-31 NOTE — H&P (View-Only) (Signed)
Patient ID: Micheal [name], male   DOB: [birth year], 88 y.o.   MRN: [id number] Wound vacs functioning well.  Plan for return to the operating room tomorrow for application of Kerecis tissue graft to all 4 extremities.

## 2021-11-01 ENCOUNTER — Encounter (HOSPITAL_COMMUNITY): Admission: EM | Disposition: E | Payer: Self-pay | Source: Home / Self Care

## 2021-11-01 LAB — CBC WITH DIFFERENTIAL/PLATELET
Abs Immature Granulocytes: 1.17 10*3/uL — ABNORMAL HIGH (ref 0.00–0.07)
Basophils Absolute: 0.1 10*3/uL (ref 0.0–0.1)
Basophils Relative: 1 %
Eosinophils Absolute: 0.1 10*3/uL (ref 0.0–0.5)
Eosinophils Relative: 1 %
HCT: 31.8 % — ABNORMAL LOW (ref 39.0–52.0)
Hemoglobin: 10.5 g/dL — ABNORMAL LOW (ref 13.0–17.0)
Immature Granulocytes: 7 %
Lymphocytes Relative: 4 %
Lymphs Abs: 0.7 10*3/uL (ref 0.7–4.0)
MCH: 30.3 pg (ref 26.0–34.0)
MCHC: 33 g/dL (ref 30.0–36.0)
MCV: 91.9 fL (ref 80.0–100.0)
Monocytes Absolute: 1.5 10*3/uL — ABNORMAL HIGH (ref 0.1–1.0)
Monocytes Relative: 9 %
Neutro Abs: 13.2 10*3/uL — ABNORMAL HIGH (ref 1.7–7.7)
Neutrophils Relative %: 78 %
Platelets: 113 10*3/uL — ABNORMAL LOW (ref 150–400)
RBC: 3.46 MIL/uL — ABNORMAL LOW (ref 4.22–5.81)
RDW: 17 % — ABNORMAL HIGH (ref 11.5–15.5)
Smear Review: NORMAL
WBC: 16.8 10*3/uL — ABNORMAL HIGH (ref 4.0–10.5)
nRBC: 0.9 % — ABNORMAL HIGH (ref 0.0–0.2)

## 2021-11-01 LAB — BASIC METABOLIC PANEL
Anion gap: 6 (ref 5–15)
BUN: 27 mg/dL — ABNORMAL HIGH (ref 8–23)
CO2: 27 mmol/L (ref 22–32)
Calcium: 8 mg/dL — ABNORMAL LOW (ref 8.9–10.3)
Chloride: 118 mmol/L — ABNORMAL HIGH (ref 98–111)
Creatinine, Ser: 0.73 mg/dL (ref 0.61–1.24)
GFR, Estimated: >60 mL/min (ref >60)
Glucose, Bld: 88 mg/dL (ref 70–99)
Potassium: 3.4 mmol/L — ABNORMAL LOW (ref 3.5–5.1)
Sodium: 151 mmol/L — ABNORMAL HIGH (ref 135–145)

## 2021-11-01 LAB — HEPATIC FUNCTION PANEL
ALT: 129 U/L — ABNORMAL HIGH (ref 0–44)
AST: 29 U/L (ref 15–41)
Albumin: 2.1 g/dL — ABNORMAL LOW (ref 3.5–5.0)
Alkaline Phosphatase: 81 U/L (ref 38–126)
Bilirubin, Direct: 0.2 mg/dL (ref 0.0–0.2)
Indirect Bilirubin: 0.9 mg/dL (ref 0.3–0.9)
Total Bilirubin: 1.1 mg/dL (ref 0.3–1.2)
Total Protein: 4.6 g/dL — ABNORMAL LOW (ref 6.5–8.1)

## 2021-11-01 LAB — GLUCOSE, CAPILLARY
Glucose-Capillary: 199 mg/dL — ABNORMAL HIGH (ref 70–99)
Glucose-Capillary: 200 mg/dL — ABNORMAL HIGH (ref 70–99)
Glucose-Capillary: 301 mg/dL — ABNORMAL HIGH (ref 70–99)
Glucose-Capillary: 88 mg/dL (ref 70–99)
Glucose-Capillary: 96 mg/dL (ref 70–99)
Glucose-Capillary: 97 mg/dL (ref 70–99)

## 2021-11-01 SURGERY — IRRIGATION AND DEBRIDEMENT EXTREMITY
Anesthesia: General | Laterality: Bilateral

## 2021-11-01 MED ORDER — HYDROCORTISONE SOD SUC (PF) 100 MG IJ SOLR
25.0000 mg | Freq: Every day | INTRAMUSCULAR | Status: AC
  Administered 2021-11-02: 25 mg via INTRAVENOUS
  Filled 2021-11-01: qty 2

## 2021-11-01 NOTE — Progress Notes (Signed)
Patient ID: KEONDRE MARKSON, male   DOB: 09/01/1928, 88 y.o.   MRN: [id number] I had a discussion with the family this morning.  They have elected to proceed with wound care dressing changes as opposed to returning to the operating room which would require repeat intubations and the stress of surgery.  I will return later today to remove the wound VAC dressings to start Silvadene dressing changes.

## 2021-11-01 NOTE — Progress Notes (Signed)
Trauma/Critical Care Follow Up Note  Subjective:    Overnight Issues:   Objective:  Vital signs for last 24 hours: Temp:  [98.3 F (36.8 C)-100.4 F (38 C)] 99 F (37.2 C) (10/11 0800) Pulse Rate:  [92-131] 101 (10/11 0900) Resp:  [14-30] 17 (10/11 0900) BP: (104-155)/(44-64) 104/44 (10/11 0900) SpO2:  [94 %-100 %] 99 % (10/11 0900) Arterial Line BP: (93-155)/(42-66) 154/66 (10/10 1800) FiO2 (%):  [40 %] 40 % (10/11 0836)  Hemodynamic parameters for last 24 hours:    Intake/Output from previous day: 10/10 0701 - 10/11 0700 In: 1759.4 [I.V.:799.5; NG/GT:660; IV Piggyback:299.9] Out: 2585 [Urine:1910; Drains:675]  Intake/Output this shift: No intake/output data recorded.  Vent settings for last 24 hours: Vent Mode: PSV;CPAP FiO2 (%):  [40 %] 40 % Set Rate:  [12 bmp] 12 bmp Vt Set:  [520 mL] 520 mL PEEP:  [5 cmH20] 5 cmH20 Pressure Support:  [5 cmH20] 5 cmH20 Plateau Pressure:  [18 cmH20] 18 cmH20  Physical Exam:  Gen: comfortable, no distress Neuro: intermittently opens eyes to familiar voice/command HEENT: PERRL Neck: supple CV: RRR Pulm: unlabored breathing Abd: soft, NT GU: clear yellow urine Extr: wwp, no edema   Results for orders placed or performed during the hospital encounter of 11/08/2021 (from the past 24 hour(s))  Glucose, capillary     Status: Abnormal   Collection Time: 10/31/21 12:35 PM  Result Value Ref Range   Glucose-Capillary 228 (H) 70 - 99 mg/dL  Glucose, capillary     Status: Abnormal   Collection Time: 10/31/21  3:22 PM  Result Value Ref Range   Glucose-Capillary 271 (H) 70 - 99 mg/dL  Glucose, capillary     Status: Abnormal   Collection Time: 10/31/21  8:15 PM  Result Value Ref Range   Glucose-Capillary 303 (H) 70 - 99 mg/dL  Glucose, capillary     Status: Abnormal   Collection Time: 11/01/21 12:15 AM  Result Value Ref Range   Glucose-Capillary 200 (H) 70 - 99 mg/dL  Glucose, capillary     Status: None   Collection Time:  11/01/21  3:55 AM  Result Value Ref Range   Glucose-Capillary 96 70 - 99 mg/dL  CBC with Differential/Platelet     Status: Abnormal   Collection Time: 11/01/21  5:00 AM  Result Value Ref Range   WBC 16.8 (H) 4.0 - 10.5 K/uL   RBC 3.46 (L) 4.22 - 5.81 MIL/uL   Hemoglobin 10.5 (L) 13.0 - 17.0 g/dL   HCT 31.8 (L) 39.0 - 52.0 %   MCV 91.9 80.0 - 100.0 fL   MCH 30.3 26.0 - 34.0 pg   MCHC 33.0 30.0 - 36.0 g/dL   RDW 17.0 (H) 11.5 - 15.5 %   Platelets 113 (L) 150 - 400 K/uL   nRBC 0.9 (H) 0.0 - 0.2 %   Neutrophils Relative % 78 %   Neutro Abs 13.2 (H) 1.7 - 7.7 K/uL   Lymphocytes Relative 4 %   Lymphs Abs 0.7 0.7 - 4.0 K/uL   Monocytes Relative 9 %   Monocytes Absolute 1.5 (H) 0.1 - 1.0 K/uL   Eosinophils Relative 1 %   Eosinophils Absolute 0.1 0.0 - 0.5 K/uL   Basophils Relative 1 %   Basophils Absolute 0.1 0.0 - 0.1 K/uL   WBC Morphology MILD LEFT SHIFT (1-5% METAS, OCC MYELO, OCC BANDS)    RBC Morphology See Note    Smear Review Normal platelet morphology    Immature Granulocytes 7 %  Abs Immature Granulocytes 1.17 (H) 0.00 - 0.07 K/uL   Bite Cells PRESENT    Polychromasia PRESENT   Basic metabolic panel     Status: Abnormal   Collection Time: 11/01/21  5:00 AM  Result Value Ref Range   Sodium 151 (H) 135 - 145 mmol/L   Potassium 3.4 (L) 3.5 - 5.1 mmol/L   Chloride 118 (H) 98 - 111 mmol/L   CO2 27 22 - 32 mmol/L   Glucose, Bld 88 70 - 99 mg/dL   BUN 27 (H) 8 - 23 mg/dL   Creatinine, Ser 0.73 0.61 - 1.24 mg/dL   Calcium 8.0 (L) 8.9 - 10.3 mg/dL   GFR, Estimated >60 >60 mL/min   Anion gap 6 5 - 15  Hepatic function panel     Status: Abnormal   Collection Time: 11/01/21  5:00 AM  Result Value Ref Range   Total Protein 4.6 (L) 6.5 - 8.1 g/dL   Albumin 2.1 (L) 3.5 - 5.0 g/dL   AST 29 15 - 41 U/L   ALT 129 (H) 0 - 44 U/L   Alkaline Phosphatase 81 38 - 126 U/L   Total Bilirubin 1.1 0.3 - 1.2 mg/dL   Bilirubin, Direct 0.2 0.0 - 0.2 mg/dL   Indirect Bilirubin 0.9 0.3 - 0.9  mg/dL  Glucose, capillary     Status: None   Collection Time: 11/01/21  8:01 AM  Result Value Ref Range   Glucose-Capillary 88 70 - 99 mg/dL    Assessment & Plan: The plan of care was discussed with the bedside nurse for the day, who is in agreement with this plan and no additional concerns were raised.   Present on Admission:  Trauma    LOS: 7 days   Additional comments:I reviewed the patient's new clinical lab test results.    PHBC   SAH/SDH - NSGY c/s, Dr. Ronnald Ramp. Keppra x7 days for seizure ppx, repeat head CT 10/5 stable Frontal scalp laceration - no underlying fx. Washout and closure per EDP. Will need staple removal 10/12 B/l nasal bone fxs - ENT c/s, Dr. Marcelline Deist. Nose blowing precautions and outpatient follow up  R rib fx 3-8 - no PTX on CT B/l pubic rami fxs, R acetabular fx, R ankle fx - ortho c/s, nonop, TDWB, will see again when extubated TP fx L5, S1 - pain control Degloving of b/l lower extremities - ortho consult, Dr. Sharol Given, s/p I&D and vac placement 10/4; initial plan for OR 10/11 but d/w family who elects for less invasive management and will transition to vac removal and daily silvadene dressing changes ABLA - 2u pRBC 10/7 followed by lasix, good response to both Hyperglycemia - off insulin gtt, now on SSI, levemir 12BID, may be able to de-escalate as steroids weaned Shock - resolved, but hemodynamically labile, has not required pressors, but does vacillate between borderline hypotension and hypertensive urgency. Cardene available. Okay for pressors per family. One dose of SDS remaining Transaminitis - limit tylenol, trend VDRF - reintubated after ~6h, optimize for one way extubation Thu vs Fri? Myoglobinuria vs rhabdomyolysis - suspect this is from extensive soft tissue injury, trend creatinine, which is currently normal FEN - NPO, OGT, TF DVT - SCDs, start LMWH H/o HTN H/o paroxsymal SVT H/o prostate cancer Neurogenic bladder - foley in ED Macular degeneration  w/ h/o retinal detachment and defect Dispo - ICU    Critical Care Total Time: 80 minutes  Jesusita Oka, MD Trauma & General Surgery Please use [email or web address]  to contact on call provider  11/01/2021  *Care during the described time interval was provided by me. I have reviewed this patient's available data, including medical history, events of note, physical examination and test results as part of my evaluation.

## 2021-11-01 NOTE — Progress Notes (Signed)
Patient ID: Micheal [name], male   DOB: 08-11-1928, 88 y.o.   MRN: [id number] Dressings were changed to all 4 extremities.  Mepitel silicone dressing applied to all the wounds covered with 4 x 4 and Kerlix.  The wound bed muscles had mild ischemic changes.  There was not  granulation tissue in the wound beds.

## 2021-11-01 NOTE — Interval H&P Note (Signed)
History and Physical Interval Note:  11/01/2021 6:56 AM  Micheal [name]  has presented today for surgery, with the diagnosis of Degloving Wounds to all 4 Extremities.  The various methods of treatment have been discussed with the patient and family. After consideration of risks, benefits and other options for treatment, the patient has consented to  Procedure(s): DEBRIDEMENT ALL 4 EXTREMITIES AND APPLY TISSUE GRAFT (Bilateral) as a surgical intervention.  The patient's history has been reviewed, patient examined, no change in status, stable for surgery.  I have reviewed the patient's chart and labs.  Questions were answered to the patient's satisfaction.     Newt Minion

## 2021-11-01 NOTE — Progress Notes (Signed)
RT placed pt on PS/CPAP mode. Pt tolerating well at this time. RT will continue to monitor pt.

## 2021-11-02 ENCOUNTER — Ambulatory Visit: Payer: Medicare Other | Admitting: Internal Medicine

## 2021-11-02 LAB — CBC WITH DIFFERENTIAL/PLATELET
Abs Immature Granulocytes: 0.63 10*3/uL — ABNORMAL HIGH (ref 0.00–0.07)
Basophils Absolute: 0.1 10*3/uL (ref 0.0–0.1)
Basophils Relative: 0 %
Eosinophils Absolute: 0.2 10*3/uL (ref 0.0–0.5)
Eosinophils Relative: 1 %
HCT: 31.9 % — ABNORMAL LOW (ref 39.0–52.0)
Hemoglobin: 10.1 g/dL — ABNORMAL LOW (ref 13.0–17.0)
Immature Granulocytes: 3 %
Lymphocytes Relative: 3 %
Lymphs Abs: 0.6 10*3/uL — ABNORMAL LOW (ref 0.7–4.0)
MCH: 30.2 pg (ref 26.0–34.0)
MCHC: 31.7 g/dL (ref 30.0–36.0)
MCV: 95.5 fL (ref 80.0–100.0)
Monocytes Absolute: 1.5 10*3/uL — ABNORMAL HIGH (ref 0.1–1.0)
Monocytes Relative: 8 %
Neutro Abs: 16.5 10*3/uL — ABNORMAL HIGH (ref 1.7–7.7)
Neutrophils Relative %: 85 %
Platelets: 141 10*3/uL — ABNORMAL LOW (ref 150–400)
RBC: 3.34 MIL/uL — ABNORMAL LOW (ref 4.22–5.81)
RDW: 16.8 % — ABNORMAL HIGH (ref 11.5–15.5)
WBC: 19.4 10*3/uL — ABNORMAL HIGH (ref 4.0–10.5)
nRBC: 0.4 % — ABNORMAL HIGH (ref 0.0–0.2)

## 2021-11-02 LAB — GLUCOSE, CAPILLARY
Glucose-Capillary: 123 mg/dL — ABNORMAL HIGH (ref 70–99)
Glucose-Capillary: 129 mg/dL — ABNORMAL HIGH (ref 70–99)
Glucose-Capillary: 172 mg/dL — ABNORMAL HIGH (ref 70–99)
Glucose-Capillary: 191 mg/dL — ABNORMAL HIGH (ref 70–99)
Glucose-Capillary: 202 mg/dL — ABNORMAL HIGH (ref 70–99)
Glucose-Capillary: 218 mg/dL — ABNORMAL HIGH (ref 70–99)
Glucose-Capillary: 275 mg/dL — ABNORMAL HIGH (ref 70–99)

## 2021-11-02 LAB — BASIC METABOLIC PANEL
Anion gap: 11 (ref 5–15)
BUN: 31 mg/dL — ABNORMAL HIGH (ref 8–23)
CO2: 26 mmol/L (ref 22–32)
Calcium: 8.5 mg/dL — ABNORMAL LOW (ref 8.9–10.3)
Chloride: 117 mmol/L — ABNORMAL HIGH (ref 98–111)
Creatinine, Ser: 0.82 mg/dL (ref 0.61–1.24)
GFR, Estimated: >60 mL/min (ref >60)
Glucose, Bld: 208 mg/dL — ABNORMAL HIGH (ref 70–99)
Potassium: 3.6 mmol/L (ref 3.5–5.1)
Sodium: 154 mmol/L — ABNORMAL HIGH (ref 135–145)

## 2021-11-02 MED ORDER — ALBUMIN HUMAN 5 % IV SOLN
12.5000 g | Freq: Once | INTRAVENOUS | Status: AC
  Administered 2021-11-02: 12.5 g via INTRAVENOUS
  Filled 2021-11-02: qty 250

## 2021-11-02 MED ORDER — OSMOLITE 1.5 CAL PO LIQD
1000.0000 mL | ORAL | Status: DC
  Administered 2021-11-02 – 2021-11-03 (×2): 1000 mL

## 2021-11-02 MED ORDER — PANTOPRAZOLE 2 MG/ML SUSPENSION
40.0000 mg | Freq: Every day | ORAL | Status: DC
  Administered 2021-11-03: 40 mg
  Filled 2021-11-02: qty 20

## 2021-11-02 NOTE — Progress Notes (Signed)
Patient ID: Micheal [name], male   DOB: 1928/02/12, 89 y.o.   MRN: [id number] Follow up - Trauma Critical Care   Patient Details:    Micheal [name] is an 88 y.o. male.  Lines/tubes : Airway 7.5 mm (Active)  Secured at (cm) 25 cm 11/02/21 0751  Measured From Lips 11/02/21 [place] 11/02/21 0751  Secured By [employer] 11/02/21 0751  Tube Holder Repositioned Yes 11/02/21 0751  Prone position No 11/01/21 0152  Cuff Pressure (cm H2O) Clear OR 27-39 CmH2O 11/02/21 0751  Site Condition Dry 11/02/21 0751     PICC Triple Lumen 40/98/11 Left Cephalic 43 cm 1 cm (Active)  Indication for Insertion or Continuance of Line Vasoactive infusions 11/01/21 2000  Exposed Catheter (cm) 1 cm 10/27/21 1458  Site Assessment Clean, Dry, Intact 11/02/21 0400  Lumen #1 Status In-line blood sampling system in place;Flushed;Blood return noted;Saline locked 11/02/21 0524  Lumen #2 Status Flushed;Blood return noted;Saline locked 11/01/21 2000  Lumen #3 Status Infusing;Flushed;Blood return noted 11/01/21 2000  Dressing Type Transparent;Securing device 11/01/21 2000  Dressing Status Clean, Dry, Intact;Antimicrobial disc in place 11/02/21 0400  Safety Lock Not Applicable 91/47/82 9562  Line Care Connections checked and tightened 11/01/21 2000  Line Adjustment (NICU/IV Team Only) No 10/27/21 2000  Dressing Intervention New dressing;Other (Comment) 10/27/21 1458  Dressing Change Due 21-Nov-2021 11/01/21 2000     Negative Pressure Wound Therapy Pretibial Right (Active)  Site / Wound Assessment Dressing in place / Unable to assess 10/31/21 2000  Peri-wound Assessment Intact 10/31/21 2000  Cycle Continuous;On 10/31/21 2000  Target Pressure (mmHg) 125 10/31/21 2000  Canister Changed No 10/31/21 2000  Machine plugged into wall outlet (NOT bed outlet) Yes 10/30/21 2000  Dressing Status Intact 10/31/21 2000  Drainage Amount Minimal 10/31/21 2000  Drainage Description Sanguineous  10/31/21 2000  Output (mL) 150 mL 11/01/21 0600     Negative Pressure Wound Therapy Pretibial Left (Active)  Site / Wound Assessment Dressing in place / Unable to assess 10/31/21 2000  Peri-wound Assessment Intact 10/31/21 2000  Cycle Continuous;On 10/31/21 2000  Target Pressure (mmHg) 125 10/31/21 2000  Canister Changed No 10/31/21 2000  Machine plugged into wall outlet (NOT bed outlet) Yes 10/31/21 2000  Dressing Status Intact 10/31/21 2000  Drainage Amount Minimal 10/31/21 2000  Drainage Description Sanguineous 10/31/21 2000  Output (mL) 150 mL 11/01/21 0600     Negative Pressure Wound Therapy Arm Lower;Posterior;Right (Active)  Site / Wound Assessment Dressing in place / Unable to assess 10/31/21 2000  Peri-wound Assessment Intact 10/31/21 2000  Cycle Continuous;On 10/31/21 2000  Target Pressure (mmHg) 125 10/31/21 2000  Canister Changed No 10/31/21 2000  Machine plugged into wall outlet (NOT bed outlet) Yes 10/31/21 2000  Dressing Status Intact 10/31/21 2000  Drainage Amount Minimal 10/31/21 2000  Drainage Description Sanguineous 10/31/21 2000  Output (mL) 25 mL 11/01/21 0600     Urethral Catheter Traci Huff Latex (Active)  Indication for Insertion or Continuance of Catheter End of life comfort care 11/02/21 0800  Site Assessment Clean, Dry, Intact 11/01/21 1951  Catheter Maintenance Bag below level of bladder;Insertion date on drainage bag;No dependent loops;Catheter secured;Drainage bag/tubing not touching floor;Seal intact 11/01/21 1951  Collection Container Standard drainage bag 11/01/21 1951  Securement Method Securing device (Describe) 11/01/21 1951  Urinary Catheter Interventions (if applicable) Unclamped 13/08/65 0713  Output (mL) 60 mL 11/02/21 0742    Microbiology/Sepsis markers: Results for orders placed or performed during the hospital encounter of 11/11/2021  Resp Panel by RT-PCR (Flu A&B, Covid) Anterior Nasal Swab     Status: None   Collection Time: 11/14/2021   8:24 AM   Specimen: Anterior Nasal Swab  Result Value Ref Range Status   SARS Coronavirus 2 by RT PCR NEGATIVE NEGATIVE Final    Comment: (NOTE) SARS-CoV-2 target nucleic acids are NOT DETECTED.  The SARS-CoV-2 RNA is generally detectable in upper respiratory specimens during the acute phase of infection. The lowest concentration of SARS-CoV-2 viral copies this assay can detect is 138 copies/mL. A negative result does not preclude SARS-Cov-2 infection and should not be used as the sole basis for treatment or other patient management decisions. A negative result may occur with  improper specimen collection/handling, submission of specimen other than nasopharyngeal swab, presence of viral mutation(s) within the areas targeted by this assay, and inadequate number of viral copies(<138 copies/mL). A negative result must be combined with clinical observations, patient history, and epidemiological information. The expected result is Negative.  Fact Sheet for Patients:  EntrepreneurPulse.com.au  Fact Sheet for Healthcare Providers:  IncredibleEmployment.be  This test is no t yet approved or cleared by the [place] FDA and  has been authorized for detection and/or diagnosis of SARS-CoV-2 by FDA under an Emergency Use Authorization (EUA). This EUA will remain  in effect (meaning this test can be used) for the duration of the COVID-19 declaration under Section 564(b)(1) of the Act, 21 U.S.C.section 360bbb-3(b)(1), unless the authorization is terminated  or revoked sooner.       Influenza A by PCR NEGATIVE NEGATIVE Final   Influenza B by PCR NEGATIVE NEGATIVE Final    Comment: (NOTE) The Xpert Xpress SARS-CoV-2/FLU/RSV plus assay is intended as an aid in the diagnosis of influenza from Nasopharyngeal swab specimens and should not be used as a sole basis for treatment. Nasal washings and aspirates are unacceptable for Xpert Xpress  SARS-CoV-2/FLU/RSV testing.  Fact Sheet for Patients: EntrepreneurPulse.com.au  Fact Sheet for Healthcare Providers: IncredibleEmployment.be  This test is not yet approved or cleared by the [place] FDA and has been authorized for detection and/or diagnosis of SARS-CoV-2 by FDA under an Emergency Use Authorization (EUA). This EUA will remain in effect (meaning this test can be used) for the duration of the COVID-19 declaration under Section 564(b)(1) of the Act, 21 U.S.C. section 360bbb-3(b)(1), unless the authorization is terminated or revoked.  Performed at [facility], [place] [address] [place], [place] [address]   Culture, Respiratory w Gram Stain     Status: None   Collection Time: 11/04/2021  1:37 PM   Specimen: Tracheal Aspirate; Respiratory  Result Value Ref Range Status   Specimen Description TRACHEAL ASPIRATE  Final   Special Requests NONE  Final   Gram Stain   Final    FEW WBC PRESENT, PREDOMINANTLY PMN RARE GRAM POSITIVE COCCI IN PAIRS    Culture   Final    RARE Normal respiratory flora-no Staph aureus or Pseudomonas seen Performed at [facility], [address], [place], [place] [address]    Report Status 10/28/2021 FINAL  Final    Anti-infectives:  Anti-infectives (From admission, onward)    Start     Dose/Rate Route Frequency Ordered Stop   10/26/21 0800  ceFAZolin (ANCEF) IVPB 2g/100 mL premix  Status:  Discontinued        2 g 200 mL/hr over 30 Minutes Intravenous On call to O.R. 11/01/2021 2356 10/26/21 0835   11/13/2021 1930  ceFAZolin (ANCEF) IVPB 1  g/50 mL premix  Status:  Discontinued        1 g 100 mL/hr over 30 Minutes Intravenous Every 6 hours 10/22/2021 1919 11/04/2021 1936   11/13/2021 1430  Ampicillin-Sulbactam (UNASYN) 3 g in sodium chloride 0.9 % 100 mL IVPB  Status:  Discontinued        3 g 200 mL/hr over 30 Minutes Intravenous Every 6 hours 11/07/2021 1342 10/31/21 1145   11/11/2021 0830  ceFAZolin  (ANCEF) IVPB 2g/100 mL premix        2 g 200 mL/hr over 30 Minutes Intravenous  Once 11/02/2021 0826 11/16/2021 0930       Consults: Treatment Team:  Md, Trauma, MD Haddix, Thomasene Lot, MD    Studies:    Events:  Subjective:    Overnight Issues:  On vent, levo Objective:  Vital signs for last 24 hours: Temp:  [99.2 F (37.3 C)-101.6 F (38.7 C)] 99.6 F (37.6 C) (10/12 0755) Pulse Rate:  [81-131] 81 (10/12 1000) Resp:  [15-29] 21 (10/12 1000) BP: (83-175)/(37-95) 103/49 (10/12 1000) SpO2:  [95 %-100 %] 99 % (10/12 1000) FiO2 (%):  [40 %] 40 % (10/12 0751) Weight:  [75.3 kg] 75.3 kg (10/12 0500)  Hemodynamic parameters for last 24 hours:    Intake/Output from previous day: 10/11 0701 - 10/12 0700 In: 2266.1 [I.V.:884.1; NG/GT:1382] Out: 1270 [Urine:1270]  Intake/Output this shift: Total I/O In: -  Out: 60 [Urine:60]  Vent settings for last 24 hours: Vent Mode: PSV;CPAP FiO2 (%):  [40 %] 40 % Set Rate:  [12 bmp] 12 bmp Vt Set:  [520 mL] 520 mL PEEP:  [5 cmH20] 5 cmH20 Pressure Support:  [5 cmH20-10 cmH20] 10 cmH20 Plateau Pressure:  [13 cmH20-15 cmH20] 13 cmH20  Physical Exam:  General: on vent Neuro: sedated, PERL HEENT/Neck: facial abrasions Resp: clear to auscultation bilaterally CVS: RRR GI: soft, NT, ND Extremities: BLE LLE ext wounds dressed  Results for orders placed or performed during the hospital encounter of 10/27/2021 (from the past 24 hour(s))  Glucose, capillary     Status: None   Collection Time: 11/01/21 11:52 AM  Result Value Ref Range   Glucose-Capillary 97 70 - 99 mg/dL  Glucose, capillary     Status: Abnormal   Collection Time: 11/01/21  3:12 PM  Result Value Ref Range   Glucose-Capillary 199 (H) 70 - 99 mg/dL  Glucose, capillary     Status: Abnormal   Collection Time: 11/01/21  8:00 PM  Result Value Ref Range   Glucose-Capillary 301 (H) 70 - 99 mg/dL  Glucose, capillary     Status: Abnormal   Collection Time: 11/01/21 11:57 PM   Result Value Ref Range   Glucose-Capillary 275 (H) 70 - 99 mg/dL  Glucose, capillary     Status: Abnormal   Collection Time: 11/02/21  3:53 AM  Result Value Ref Range   Glucose-Capillary 191 (H) 70 - 99 mg/dL  CBC with Differential/Platelet     Status: Abnormal   Collection Time: 11/02/21  5:25 AM  Result Value Ref Range   WBC 19.4 (H) 4.0 - 10.5 K/uL   RBC 3.34 (L) 4.22 - 5.81 MIL/uL   Hemoglobin 10.1 (L) 13.0 - 17.0 g/dL   HCT 31.9 (L) 39.0 - 52.0 %   MCV 95.5 80.0 - 100.0 fL   MCH 30.2 26.0 - 34.0 pg   MCHC 31.7 30.0 - 36.0 g/dL   RDW 16.8 (H) 11.5 - 15.5 %   Platelets 141 (L) 150 - 400  K/uL   nRBC 0.4 (H) 0.0 - 0.2 %   Neutrophils Relative % 85 %   Neutro Abs 16.5 (H) 1.7 - 7.7 K/uL   Lymphocytes Relative 3 %   Lymphs Abs 0.6 (L) 0.7 - 4.0 K/uL   Monocytes Relative 8 %   Monocytes Absolute 1.5 (H) 0.1 - 1.0 K/uL   Eosinophils Relative 1 %   Eosinophils Absolute 0.2 0.0 - 0.5 K/uL   Basophils Relative 0 %   Basophils Absolute 0.1 0.0 - 0.1 K/uL   Immature Granulocytes 3 %   Abs Immature Granulocytes 0.63 (H) 0.00 - 0.07 K/uL  Basic metabolic panel     Status: Abnormal   Collection Time: 11/02/21  5:25 AM  Result Value Ref Range   Sodium 154 (H) 135 - 145 mmol/L   Potassium 3.6 3.5 - 5.1 mmol/L   Chloride 117 (H) 98 - 111 mmol/L   CO2 26 22 - 32 mmol/L   Glucose, Bld 208 (H) 70 - 99 mg/dL   BUN 31 (H) 8 - 23 mg/dL   Creatinine, Ser 0.82 0.61 - 1.24 mg/dL   Calcium 8.5 (L) 8.9 - 10.3 mg/dL   GFR, Estimated >60 >60 mL/min   Anion gap 11 5 - 15  Glucose, capillary     Status: Abnormal   Collection Time: 11/02/21  8:08 AM  Result Value Ref Range   Glucose-Capillary 202 (H) 70 - 99 mg/dL  Glucose, capillary     Status: Abnormal   Collection Time: 11/02/21 11:32 AM  Result Value Ref Range   Glucose-Capillary 123 (H) 70 - 99 mg/dL    Assessment & Plan: Present on Admission:  Trauma    LOS: 8 days   Additional comments:I reviewed the patient's new clinical lab  test results. / PHBC   SAH/SDH - NSGY c/s, Dr. Ronnald Ramp. Keppra x7 days for seizure ppx, repeat head CT 10/5 stable Frontal scalp laceration - no underlying fx. Washout and closure per EDP. Will need staple removal today B/l nasal bone fxs - ENT c/s, Dr. Marcelline Deist. Nose blowing precautions and outpatient follow up  R rib fx 3-8 - no PTX on CT B/l pubic rami fxs, R acetabular fx, R ankle fx - ortho c/s, nonop, TDWB, will see again when extubated TP fx L5, S1 - pain control Degloving of b/l lower extremities - ortho consult, Dr. Sharol Given, s/p I&D and vac placement 10/4; initial plan for OR 10/11 but d/w family who elects for less invasive management and will transition to vac removal and daily silvadene dressing changes ABLA  Hyperglycemia -  SSI, levemir 12BID Shock - hemodynamically labile with sedation, Albumin x 1 now, has weeping from wounds. Sedation lowers BP. Transaminitis - improving a lot Acute hypoxic ventilator sependent respiratory failure - reintubated after ~6h, optimize for one way extubation maybe tomorrow. Weaning on 10/5 now Myoglobinuria vs rhabdomyolysis - suspect this is from extensive soft tissue injury, trend creatinine, which is currently normal FEN - NPO, OGT, TF DVT - SCDs, start LMWH H/o HTN H/o paroxsymal SVT H/o prostate cancer Neurogenic bladder - foley in ED Macular degeneration w/ h/o retinal detachment and defect Dispo - ICU Partial code. I spoke at length with his wife, daughter, and son-in-law. His wife is very clear he would not want surgery and would not want re-intubation once extubated again. Will try to optimize for one way extubation tomorrow.  Critical Care Total Time*: 35 Minutes  Georganna Skeans, MD, MPH, FACS Trauma & General Surgery Use [email or web address]  to contact on call provider  11/02/2021  *Care during the described time interval was provided by me. I have reviewed this patient's available data, including medical history, events of note, physical  examination and test results as part of my evaluation.
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
## 2021-11-02 NOTE — Progress Notes (Signed)
Assessed for sutures in forehead with a Sherlyn Lick. Did not find any sutures to remove

## 2021-11-02 NOTE — Progress Notes (Signed)
Trauma Event Note   Sutures x3 removed from forehead. Pt tolerated well, remains intubated and sedated with fentanyl. Wound bed remains scabbed over.   Last imported Vital Signs BP (!) 136/48   Pulse (!) 104   Temp 99 F (37.2 C) (Oral)   Resp 20   Ht '5\' 7"'$  (1.702 m)   Wt 166 lb 0.1 oz (75.3 kg)   SpO2 99%   BMI 26.00 kg/m   Trending CBC Recent Labs    10/31/21 0500 11/01/21 0500 11/02/21 0525  WBC 19.3* 16.8* 19.4*  HGB 10.0* 10.5* 10.1*  HCT 29.2* 31.8* 31.9*  PLT 114* 113* 141*    Trending Coag's No results for input(s): "APTT", "INR" in the last 72 hours.  Trending BMET Recent Labs    10/31/21 0500 11/01/21 0500 11/02/21 0525  NA 143 151* 154*  K 3.2* 3.4* 3.6  CL 112* 118* 117*  CO2 '23 27 26  '$ BUN 24* 27* 31*  CREATININE 0.75 0.73 0.82  GLUCOSE 195* 88 208*      Micheal [name] O Micheal [name]  Trauma Response RN  Please call TRN at [phone] for further assistance.

## 2021-11-03 LAB — BASIC METABOLIC PANEL
Anion gap: 8 (ref 5–15)
BUN: 28 mg/dL — ABNORMAL HIGH (ref 8–23)
CO2: 26 mmol/L (ref 22–32)
Calcium: 8 mg/dL — ABNORMAL LOW (ref 8.9–10.3)
Chloride: 121 mmol/L — ABNORMAL HIGH (ref 98–111)
Creatinine, Ser: 0.87 mg/dL (ref 0.61–1.24)
GFR, Estimated: >60 mL/min (ref >60)
Glucose, Bld: 140 mg/dL — ABNORMAL HIGH (ref 70–99)
Potassium: 3.3 mmol/L — ABNORMAL LOW (ref 3.5–5.1)
Sodium: 155 mmol/L — ABNORMAL HIGH (ref 135–145)

## 2021-11-03 LAB — CBC WITH DIFFERENTIAL/PLATELET
Abs Immature Granulocytes: 0.38 10*3/uL — ABNORMAL HIGH (ref 0.00–0.07)
Basophils Absolute: 0.1 10*3/uL (ref 0.0–0.1)
Basophils Relative: 0 %
Eosinophils Absolute: 0.2 10*3/uL (ref 0.0–0.5)
Eosinophils Relative: 1 %
HCT: 34.2 % — ABNORMAL LOW (ref 39.0–52.0)
Hemoglobin: 10.6 g/dL — ABNORMAL LOW (ref 13.0–17.0)
Immature Granulocytes: 2 %
Lymphocytes Relative: 4 %
Lymphs Abs: 0.9 10*3/uL (ref 0.7–4.0)
MCH: 29.9 pg (ref 26.0–34.0)
MCHC: 31 g/dL (ref 30.0–36.0)
MCV: 96.3 fL (ref 80.0–100.0)
Monocytes Absolute: 1.5 10*3/uL — ABNORMAL HIGH (ref 0.1–1.0)
Monocytes Relative: 6 %
Neutro Abs: 22.3 10*3/uL — ABNORMAL HIGH (ref 1.7–7.7)
Neutrophils Relative %: 87 %
Platelets: 164 10*3/uL (ref 150–400)
RBC: 3.55 MIL/uL — ABNORMAL LOW (ref 4.22–5.81)
RDW: 16.4 % — ABNORMAL HIGH (ref 11.5–15.5)
WBC: 25.4 10*3/uL — ABNORMAL HIGH (ref 4.0–10.5)
nRBC: 0.1 % (ref 0.0–0.2)

## 2021-11-03 LAB — GLUCOSE, CAPILLARY
Glucose-Capillary: 133 mg/dL — ABNORMAL HIGH (ref 70–99)
Glucose-Capillary: 128 mg/dL — ABNORMAL HIGH (ref 70–99)
Glucose-Capillary: 120 mg/dL — ABNORMAL HIGH (ref 70–99)

## 2021-11-03 MED ORDER — GLYCOPYRROLATE 0.2 MG/ML IJ SOLN
0.2000 mg | INTRAMUSCULAR | Status: DC | PRN
  Administered 2021-11-03: 0.2 mg via INTRAVENOUS
  Filled 2021-11-03: qty 1

## 2021-11-03 MED ORDER — LORAZEPAM 2 MG/ML IJ SOLN
2.0000 mg | INTRAMUSCULAR | Status: DC | PRN
  Administered 2021-11-03: 2 mg via INTRAVENOUS
  Filled 2021-11-03: qty 1

## 2021-11-03 MED ORDER — ACETAMINOPHEN 650 MG RE SUPP: 650.0000 mg | Freq: Four times a day (QID) | RECTAL | Status: DC | PRN

## 2021-11-03 MED ORDER — ONDANSETRON HCL 4 MG/2ML IJ SOLN: 4.0000 mg | Freq: Four times a day (QID) | INTRAMUSCULAR | Status: DC | PRN

## 2021-11-03 MED ORDER — FENTANYL 2500MCG IN NS 250ML (10MCG/ML) PREMIX INFUSION
0.0000 ug/h | INTRAVENOUS | Status: DC
  Administered 2021-11-03: 175 ug/h via INTRAVENOUS
  Filled 2021-11-03: qty 250

## 2021-11-03 MED ORDER — ACETAMINOPHEN 325 MG PO TABS: 650.0000 mg | ORAL_TABLET | Freq: Four times a day (QID) | ORAL | Status: DC | PRN

## 2021-11-03 MED ORDER — FENTANYL BOLUS VIA INFUSION
100.0000 ug | INTRAVENOUS | Status: DC | PRN
  Administered 2021-11-03: 100 ug via INTRAVENOUS

## 2021-11-03 MED ORDER — GLYCOPYRROLATE 1 MG PO TABS: 1.0000 mg | ORAL_TABLET | ORAL | Status: DC | PRN

## 2021-11-03 MED ORDER — GLYCOPYRROLATE 0.2 MG/ML IJ SOLN: 0.2000 mg | INTRAMUSCULAR | Status: DC | PRN

## 2021-11-03 MED ORDER — ONDANSETRON 4 MG PO TBDP: 4.0000 mg | ORAL_TABLET | Freq: Four times a day (QID) | ORAL | Status: DC | PRN

## 2021-11-03 MED ORDER — POLYVINYL ALCOHOL 1.4 % OP SOLN: 1.0000 [drp] | Freq: Four times a day (QID) | OPHTHALMIC | Status: DC | PRN

## 2021-11-03 MED ORDER — IPRATROPIUM-ALBUTEROL 0.5-2.5 (3) MG/3ML IN SOLN
3.0000 mL | Freq: Four times a day (QID) | RESPIRATORY_TRACT | Status: DC
  Filled 2021-11-03: qty 3

## 2021-11-05 ENCOUNTER — Encounter: Payer: Self-pay | Admitting: General Surgery

## 2021-11-06 ENCOUNTER — Other Ambulatory Visit: Payer: Self-pay | Admitting: Family Medicine

## 2021-11-22 NOTE — Progress Notes (Signed)
Per MD order, pt was extubated to comfort care and on room air. RN currently at bedside.

## 2021-11-22 NOTE — Progress Notes (Signed)
Patient ID: Micheal [name], male   DOB: May 09, 1928, 88 y.o.   MRN: [id number] Follow up - Trauma Critical Care   Patient Details:    Micheal [name] is an 88 y.o. male.  Lines/tubes : PICC Triple Lumen 47/82/95 Left Cephalic 43 cm 1 cm (Active)  Indication for Insertion or Continuance of Line Vasoactive infusions November 10, 2021 0800  Exposed Catheter (cm) 1 cm 10/27/21 1458  Site Assessment Clean, Dry, Intact 2021/11/10 0800  Lumen #1 Status Infusing 11-10-2021 0800  Lumen #2 Status Infusing Nov 10, 2021 0800  Lumen #3 Status In-line blood sampling system in place 10-Nov-2021 0800  Dressing Type Transparent;Securing device 11-10-2021 0800  Dressing Status Antimicrobial disc in place;Clean, Dry, Intact 10-Nov-2021 0800  Safety Lock Not Applicable 62/13/08 6578  Line Care Connections checked and tightened 11-10-2021 0800  Line Adjustment (NICU/IV Team Only) No 10/27/21 2000  Dressing Intervention New dressing;Other (Comment) 10/27/21 1458  Dressing Change Due 11/10/2021 November 10, 2021 0800     Negative Pressure Wound Therapy Pretibial Right (Active)  Site / Wound Assessment Dressing in place / Unable to assess 10/31/21 2000  Peri-wound Assessment Intact 10/31/21 2000  Cycle Continuous;On 10/31/21 2000  Target Pressure (mmHg) 125 10/31/21 2000  Canister Changed No 10/31/21 2000  Machine plugged into wall outlet (NOT bed outlet) Yes 10/30/21 2000  Dressing Status Intact 10/31/21 2000  Drainage Amount Minimal 10/31/21 2000  Drainage Description Sanguineous 10/31/21 2000  Output (mL) 150 mL 11/01/21 0600     Negative Pressure Wound Therapy Pretibial Left (Active)  Site / Wound Assessment Dressing in place / Unable to assess 10/31/21 2000  Peri-wound Assessment Intact 10/31/21 2000  Cycle Continuous;On 10/31/21 2000  Target Pressure (mmHg) 125 10/31/21 2000  Canister Changed No 10/31/21 2000  Machine plugged into wall outlet (NOT bed outlet) Yes 10/31/21 2000  Dressing Status Intact 10/31/21 2000  Drainage  Amount Minimal 10/31/21 2000  Drainage Description Sanguineous 10/31/21 2000  Output (mL) 150 mL 11/01/21 0600     Negative Pressure Wound Therapy Arm Lower;Posterior;Right (Active)  Site / Wound Assessment Dressing in place / Unable to assess 10/31/21 2000  Peri-wound Assessment Intact 10/31/21 2000  Cycle Continuous;On 10/31/21 2000  Target Pressure (mmHg) 125 10/31/21 2000  Canister Changed No 10/31/21 2000  Machine plugged into wall outlet (NOT bed outlet) Yes 10/31/21 2000  Dressing Status Intact 10/31/21 2000  Drainage Amount Minimal 10/31/21 2000  Drainage Description Sanguineous 10/31/21 2000  Output (mL) 25 mL 11/01/21 0600     Urethral Catheter Traci Huff Latex (Active)  Indication for Insertion or Continuance of Catheter Bladder outlet obstruction / other urologic reason 11-10-21 0800  Site Assessment Clean, Dry, Intact 11-10-21 0800  Catheter Maintenance Bag below level of bladder;Catheter secured;Drainage bag/tubing not touching floor;Insertion date on drainage bag;No dependent loops;Seal intact 11/10/21 0800  Collection Container Standard drainage bag 11/10/21 0800  Securement Method Securing device (Describe) Nov 10, 2021 0800  Urinary Catheter Interventions (if applicable) Unclamped 46/96/29 0800  Output (mL) 60 mL 11-10-21 1200    Microbiology/Sepsis markers: Results for orders placed or performed during the hospital encounter of 11/20/2021  Resp Panel by RT-PCR (Flu A&B, Covid) Anterior Nasal Swab     Status: None   Collection Time: 10/31/2021  8:24 AM   Specimen: Anterior Nasal Swab  Result Value Ref Range Status   SARS Coronavirus 2 by RT PCR NEGATIVE NEGATIVE Final    Comment: (NOTE) SARS-CoV-2 target nucleic acids are NOT DETECTED.  The SARS-CoV-2 RNA is generally detectable in upper respiratory specimens during  the acute phase of infection. The lowest concentration of SARS-CoV-2 viral copies this assay can detect is 138 copies/mL. A negative result does not  preclude SARS-Cov-2 infection and should not be used as the sole basis for treatment or other patient management decisions. A negative result may occur with  improper specimen collection/handling, submission of specimen other than nasopharyngeal swab, presence of viral mutation(s) within the areas targeted by this assay, and inadequate number of viral copies(<138 copies/mL). A negative result must be combined with clinical observations, patient history, and epidemiological information. The expected result is Negative.  Fact Sheet for Patients:  EntrepreneurPulse.com.au  Fact Sheet for Healthcare Providers:  IncredibleEmployment.be  This test is no t yet approved or cleared by the [place] FDA and  has been authorized for detection and/or diagnosis of SARS-CoV-2 by FDA under an Emergency Use Authorization (EUA). This EUA will remain  in effect (meaning this test can be used) for the duration of the COVID-19 declaration under Section 564(b)(1) of the Act, 21 U.S.C.section 360bbb-3(b)(1), unless the authorization is terminated  or revoked sooner.       Influenza A by PCR NEGATIVE NEGATIVE Final   Influenza B by PCR NEGATIVE NEGATIVE Final    Comment: (NOTE) The Xpert Xpress SARS-CoV-2/FLU/RSV plus assay is intended as an aid in the diagnosis of influenza from Nasopharyngeal swab specimens and should not be used as a sole basis for treatment. Nasal washings and aspirates are unacceptable for Xpert Xpress SARS-CoV-2/FLU/RSV testing.  Fact Sheet for Patients: EntrepreneurPulse.com.au  Fact Sheet for Healthcare Providers: IncredibleEmployment.be  This test is not yet approved or cleared by the [place] FDA and has been authorized for detection and/or diagnosis of SARS-CoV-2 by FDA under an Emergency Use Authorization (EUA). This EUA will remain in effect (meaning this test can be used) for the  duration of the COVID-19 declaration under Section 564(b)(1) of the Act, 21 U.S.C. section 360bbb-3(b)(1), unless the authorization is terminated or revoked.  Performed at [facility], [place] [address] [place], [place] [address]   Culture, Respiratory w Gram Stain     Status: None   Collection Time: 11/15/2021  1:37 PM   Specimen: Tracheal Aspirate; Respiratory  Result Value Ref Range Status   Specimen Description TRACHEAL ASPIRATE  Final   Special Requests NONE  Final   Gram Stain   Final    FEW WBC PRESENT, PREDOMINANTLY PMN RARE GRAM POSITIVE COCCI IN PAIRS    Culture   Final    RARE Normal respiratory flora-no Staph aureus or Pseudomonas seen Performed at [facility], [address], [place], [place] [address]    Report Status 10/28/2021 FINAL  Final    Anti-infectives:  Anti-infectives (From admission, onward)    Start     Dose/Rate Route Frequency Ordered Stop   10/26/21 0800  ceFAZolin (ANCEF) IVPB 2g/100 mL premix  Status:  Discontinued        2 g 200 mL/hr over 30 Minutes Intravenous On call to O.R. 11/10/2021 2356 10/26/21 0835   10/24/2021 1930  ceFAZolin (ANCEF) IVPB 1 g/50 mL premix  Status:  Discontinued        1 g 100 mL/hr over 30 Minutes Intravenous Every 6 hours 11/21/2021 1919 10/24/2021 1936   11/20/2021 1430  Ampicillin-Sulbactam (UNASYN) 3 g in sodium chloride 0.9 % 100 mL IVPB  Status:  Discontinued        3 g 200 mL/hr over 30 Minutes Intravenous Every 6 hours 10/31/2021 1342 10/31/21  1145   11/15/2021 0830  ceFAZolin (ANCEF) IVPB 2g/100 mL premix        2 g 200 mL/hr over 30 Minutes Intravenous  Once 11/15/2021 0826 11/08/2021 0930     Consults: Treatment Team:  Md, Trauma, MD Haddix, Thomasene Lot, MD    Studies:    Events:  Subjective:    Overnight Issues:   Objective:  Vital signs for last 24 hours: Temp:  [98.1 F (36.7 C)-101.3 F (38.5 C)] 101.3 F (38.5 C) (10/13 1200) Pulse Rate:  [77-114] 89 (10/13 1345) Resp:  [16-26] 20 (10/13  1345) BP: (79-187)/(35-75) 94/46 (10/13 1345) SpO2:  [96 %-100 %] 98 % (10/13 1345) FiO2 (%):  [40 %] 40 % (10/13 1117)  Hemodynamic parameters for last 24 hours:    Intake/Output from previous day: 10/12 0701 - 10/13 0700 In: 2183.3 [I.V.:938.2; NG/GT:959; IV Piggyback:286.1] Out: 1175 [Urine:1175]  Intake/Output this shift: Total I/O In: 402.6 [I.V.:82.1; NG/GT:320.5] Out: 245 [Urine:245]  Vent settings for last 24 hours: Vent Mode: PRVC FiO2 (%):  [40 %] 40 % Set Rate:  [12 bmp] 12 bmp Vt Set:  [520 mL] 520 mL PEEP:  [5 cmH20] 5 cmH20 Plateau Pressure:  [17 cmH20-19 cmH20] 19 cmH20  Physical Exam:  General: on vent Neuro: Sedated HEENT/Neck: ETT and large facial abrasion Resp: CTA CVS: regular rate and rhythm, S1, S2 normal, no murmur, click, rub or gallop and . GI: Soft, nontender Extremities: Large wounds dressed  Results for orders placed or performed during the hospital encounter of 11/20/2021 (from the past 24 hour(s))  Glucose, capillary     Status: Abnormal   Collection Time: 11/02/21  3:58 PM  Result Value Ref Range   Glucose-Capillary 129 (H) 70 - 99 mg/dL  Glucose, capillary     Status: Abnormal   Collection Time: 11/02/21  7:40 PM  Result Value Ref Range   Glucose-Capillary 172 (H) 70 - 99 mg/dL  Glucose, capillary     Status: Abnormal   Collection Time: 11/02/21 11:35 PM  Result Value Ref Range   Glucose-Capillary 218 (H) 70 - 99 mg/dL  Glucose, capillary     Status: Abnormal   Collection Time: 11/06/2021  3:37 AM  Result Value Ref Range   Glucose-Capillary 133 (H) 70 - 99 mg/dL  CBC with Differential/Platelet     Status: Abnormal   Collection Time: 2021/11/06  5:45 AM  Result Value Ref Range   WBC 25.4 (H) 4.0 - 10.5 K/uL   RBC 3.55 (L) 4.22 - 5.81 MIL/uL   Hemoglobin 10.6 (L) 13.0 - 17.0 g/dL   HCT 34.2 (L) 39.0 - 52.0 %   MCV 96.3 80.0 - 100.0 fL   MCH 29.9 26.0 - 34.0 pg   MCHC 31.0 30.0 - 36.0 g/dL   RDW 16.4 (H) 11.5 - 15.5 %   Platelets  164 150 - 400 K/uL   nRBC 0.1 0.0 - 0.2 %   Neutrophils Relative % 87 %   Neutro Abs 22.3 (H) 1.7 - 7.7 K/uL   Lymphocytes Relative 4 %   Lymphs Abs 0.9 0.7 - 4.0 K/uL   Monocytes Relative 6 %   Monocytes Absolute 1.5 (H) 0.1 - 1.0 K/uL   Eosinophils Relative 1 %   Eosinophils Absolute 0.2 0.0 - 0.5 K/uL   Basophils Relative 0 %   Basophils Absolute 0.1 0.0 - 0.1 K/uL   Immature Granulocytes 2 %   Abs Immature Granulocytes 0.38 (H) 0.00 - 0.07 K/uL  Basic metabolic panel  Status: Abnormal   Collection Time: 11/27/2021  5:45 AM  Result Value Ref Range   Sodium 155 (H) 135 - 145 mmol/L   Potassium 3.3 (L) 3.5 - 5.1 mmol/L   Chloride 121 (H) 98 - 111 mmol/L   CO2 26 22 - 32 mmol/L   Glucose, Bld 140 (H) 70 - 99 mg/dL   BUN 28 (H) 8 - 23 mg/dL   Creatinine, Ser 0.87 0.61 - 1.24 mg/dL   Calcium 8.0 (L) 8.9 - 10.3 mg/dL   GFR, Estimated >60 >60 mL/min   Anion gap 8 5 - 15  Glucose, capillary     Status: Abnormal   Collection Time: 2021/11/27  7:49 AM  Result Value Ref Range   Glucose-Capillary 128 (H) 70 - 99 mg/dL  Glucose, capillary     Status: Abnormal   Collection Time: 11-27-21 11:47 AM  Result Value Ref Range   Glucose-Capillary 120 (H) 70 - 99 mg/dL    Assessment & Plan: Present on Admission:  Trauma    LOS: 9 days   Additional comments:I reviewed the patient's new clinical lab test results. / PHBC   SAH/SDH - NSGY c/s, Dr. Ronnald Ramp. Keppra x7 days for seizure ppx, repeat head CT 10/5 stable Frontal scalp laceration - no underlying fx. Washout and closure per EDP. Will need staple removal today B/l nasal bone fxs - ENT c/s, Dr. Marcelline Deist. Nose blowing precautions and outpatient follow up  R rib fx 3-8 - no PTX on CT B/l pubic rami fxs, R acetabular fx, R ankle fx - ortho c/s, nonop, TDWB, will see again when extubated TP fx L5, S1 - pain control Degloving of b/l lower extremities - ortho consult, Dr. Sharol Given, s/p I&D and vac placement 10/4; initial plan for OR 10/11 but  d/w family who elects for less invasive management and will transition to vac removal and daily silvadene dressing changes ABLA  Hyperglycemia -  SSI, levemir 12BID Shock - hemodynamically labile with sedation, Albumin x 1 now, has weeping from wounds. Sedation lowers BP. Transaminitis - improving a lot Acute hypoxic ventilator sependent respiratory failure - reintubated after ~6h, optimize for one way extubation maybe tomorrow. Weaning on 10/5 now Myoglobinuria vs rhabdomyolysis - suspect this is from extensive soft tissue injury, trend creatinine, which is currently normal FEN - NPO, OGT, TF DVT - SCDs, start LMWH H/o HTN H/o paroxsymal SVT H/o prostate cancer Neurogenic bladder - foley in ED Macular degeneration w/ h/o retinal detachment and defect Dispo - ICU   I had an extensive conversation at the bedside again with his wife and son-in-law.  They have decided to proceed with palliative extubation and transition to comfort care.  I expressed my support for their decision. Critical Care Total Time*: 107 Minutes  Georganna Skeans, MD, MPH, FACS Trauma & General Surgery Use [email or web address] to contact on call provider  Nov 27, 2021  *Care during the described time interval was provided by me. I have reviewed this patient's available data, including medical history, events of note, physical examination and test results as part of my evaluation.
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
## 2021-11-22 NOTE — Progress Notes (Addendum)
Patient asystole on monitor. This RN and Sharlet Salina, RN ausculated and confirmed no heart sounds or breath sounds present.  TOD: [id number]  Provided emotional support for family and visitors. Notified Dr. Georganna Skeans

## 2021-11-22 DEATH — deceased

## 2021-11-24 ENCOUNTER — Telehealth: Payer: Self-pay

## 2021-11-24 NOTE — Telephone Encounter (Signed)
Patient's daughter Frederich Chick called, requesting copy of letter that was written on patient's last appointment pertaining to the moving of the mailbox. I explained to the daughter that since she was not listed to release information to that she would need to have her mother called and request letter. Once she calls back and request letter, it will be printed, signed and mailed.

## 2021-12-21 ENCOUNTER — Encounter (INDEPENDENT_AMBULATORY_CARE_PROVIDER_SITE_OTHER): Payer: Medicare Other | Admitting: Ophthalmology

## 2021-12-27 ENCOUNTER — Ambulatory Visit: Payer: Medicare Other | Admitting: Family Medicine

## 2022-03-01 ENCOUNTER — Encounter: Payer: Medicare Other | Admitting: Family
